# Patient Record
Sex: Female | Born: 1980 | Race: White | Hispanic: No | Marital: Single | Smoking: Never smoker
Health system: Southern US, Community
[De-identification: ages and names within clinical notes are randomized; demographics above are authoritative.]

## PROBLEM LIST (undated history)

## (undated) ENCOUNTER — Emergency Department

## (undated) DIAGNOSIS — M549 Dorsalgia, unspecified: Secondary | ICD-10-CM

## (undated) DIAGNOSIS — N158 Other specified renal tubulo-interstitial diseases: Secondary | ICD-10-CM

## (undated) DIAGNOSIS — Z9889 Other specified postprocedural states: Secondary | ICD-10-CM

## (undated) DIAGNOSIS — G5600 Carpal tunnel syndrome, unspecified upper limb: Secondary | ICD-10-CM

## (undated) DIAGNOSIS — Z950 Presence of cardiac pacemaker: Secondary | ICD-10-CM

## (undated) DIAGNOSIS — G9050 Complex regional pain syndrome I, unspecified: Secondary | ICD-10-CM

## (undated) DIAGNOSIS — G8929 Other chronic pain: Secondary | ICD-10-CM

## (undated) DIAGNOSIS — R112 Nausea with vomiting, unspecified: Secondary | ICD-10-CM

## (undated) DIAGNOSIS — I951 Orthostatic hypotension: Secondary | ICD-10-CM

## (undated) DIAGNOSIS — I495 Sick sinus syndrome: Secondary | ICD-10-CM

## (undated) DIAGNOSIS — G90A Postural orthostatic tachycardia syndrome (POTS): Secondary | ICD-10-CM

## (undated) DIAGNOSIS — R000 Tachycardia, unspecified: Secondary | ICD-10-CM

## (undated) DIAGNOSIS — E612 Magnesium deficiency: Secondary | ICD-10-CM

## (undated) DIAGNOSIS — I498 Other specified cardiac arrhythmias: Secondary | ICD-10-CM

## (undated) DIAGNOSIS — E876 Hypokalemia: Secondary | ICD-10-CM

## (undated) DIAGNOSIS — G809 Cerebral palsy, unspecified: Secondary | ICD-10-CM

## (undated) HISTORY — DX: Tachycardia, unspecified: R00.0

## (undated) HISTORY — DX: Orthostatic hypotension: I95.1

## (undated) HISTORY — DX: Sick sinus syndrome: I49.5

## (undated) HISTORY — DX: Complex regional pain syndrome I, unspecified: G90.50

## (undated) HISTORY — DX: Postural orthostatic tachycardia syndrome (POTS): G90.A

## (undated) HISTORY — PX: PACEMAKER INSERTION: SHX728

## (undated) HISTORY — DX: Other specified cardiac arrhythmias: I49.8

## (undated) HISTORY — PX: INSERT / REPLACE / REMOVE PACEMAKER: SUR710

## (undated) HISTORY — PX: ORTHOPEDIC SURGERY: SHX850

## (undated) HISTORY — DX: Hypokalemia: E87.6

## (undated) HISTORY — PX: PORTACATH PLACEMENT: SHX2246

## (undated) HISTORY — DX: Other specified renal tubulo-interstitial diseases: N15.8

## (undated) HISTORY — DX: Hypomagnesemia: E83.42

## (undated) HISTORY — DX: Cerebral palsy, unspecified: G80.9

---
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
## 2001-01-08 ENCOUNTER — Emergency Department (HOSPITAL_COMMUNITY): Admission: EM | Admit: 2001-01-08 | Discharge: 2001-01-08 | Payer: Self-pay | Admitting: Emergency Medicine

## 2001-02-23 ENCOUNTER — Emergency Department (HOSPITAL_COMMUNITY): Admission: EM | Admit: 2001-02-23 | Discharge: 2001-02-23 | Payer: Self-pay | Admitting: Emergency Medicine

## 2001-04-16 ENCOUNTER — Emergency Department (HOSPITAL_COMMUNITY): Admission: EM | Admit: 2001-04-16 | Discharge: 2001-04-16 | Payer: Self-pay | Admitting: Emergency Medicine

## 2001-04-24 ENCOUNTER — Encounter: Payer: Self-pay | Admitting: Otolaryngology

## 2001-04-24 ENCOUNTER — Encounter: Admission: RE | Admit: 2001-04-24 | Discharge: 2001-04-24 | Payer: Self-pay | Admitting: Otolaryngology

## 2001-05-02 ENCOUNTER — Emergency Department (HOSPITAL_COMMUNITY): Admission: EM | Admit: 2001-05-02 | Discharge: 2001-05-02 | Payer: Self-pay | Admitting: Family Medicine

## 2001-05-25 ENCOUNTER — Emergency Department (HOSPITAL_COMMUNITY): Admission: EM | Admit: 2001-05-25 | Discharge: 2001-05-25 | Payer: Self-pay | Admitting: Emergency Medicine

## 2001-06-11 ENCOUNTER — Emergency Department (HOSPITAL_COMMUNITY): Admission: EM | Admit: 2001-06-11 | Discharge: 2001-06-11 | Payer: Self-pay | Admitting: *Deleted

## 2001-06-11 ENCOUNTER — Encounter: Payer: Self-pay | Admitting: *Deleted

## 2001-06-17 ENCOUNTER — Emergency Department (HOSPITAL_COMMUNITY): Admission: EM | Admit: 2001-06-17 | Discharge: 2001-06-17 | Payer: Self-pay | Admitting: Emergency Medicine

## 2001-06-25 ENCOUNTER — Emergency Department (HOSPITAL_COMMUNITY): Admission: EM | Admit: 2001-06-25 | Discharge: 2001-06-25 | Payer: Self-pay

## 2001-06-28 ENCOUNTER — Emergency Department (HOSPITAL_COMMUNITY): Admission: EM | Admit: 2001-06-28 | Discharge: 2001-06-28 | Payer: Self-pay | Admitting: Emergency Medicine

## 2001-07-07 ENCOUNTER — Emergency Department (HOSPITAL_COMMUNITY): Admission: EM | Admit: 2001-07-07 | Discharge: 2001-07-07 | Payer: Self-pay | Admitting: *Deleted

## 2001-07-13 ENCOUNTER — Emergency Department (HOSPITAL_COMMUNITY): Admission: EM | Admit: 2001-07-13 | Discharge: 2001-07-13 | Payer: Self-pay

## 2001-07-22 ENCOUNTER — Emergency Department (HOSPITAL_COMMUNITY): Admission: EM | Admit: 2001-07-22 | Discharge: 2001-07-22 | Payer: Self-pay | Admitting: Emergency Medicine

## 2001-08-04 ENCOUNTER — Encounter: Payer: Self-pay | Admitting: Emergency Medicine

## 2001-08-04 ENCOUNTER — Emergency Department (HOSPITAL_COMMUNITY): Admission: EM | Admit: 2001-08-04 | Discharge: 2001-08-04 | Payer: Self-pay

## 2001-10-21 ENCOUNTER — Encounter: Payer: Self-pay | Admitting: Emergency Medicine

## 2001-10-21 ENCOUNTER — Emergency Department (HOSPITAL_COMMUNITY): Admission: EM | Admit: 2001-10-21 | Discharge: 2001-10-21 | Payer: Self-pay | Admitting: *Deleted

## 2002-04-04 ENCOUNTER — Emergency Department (HOSPITAL_COMMUNITY): Admission: EM | Admit: 2002-04-04 | Discharge: 2002-04-04 | Payer: Self-pay | Admitting: Emergency Medicine

## 2002-05-07 ENCOUNTER — Emergency Department (HOSPITAL_COMMUNITY): Admission: EM | Admit: 2002-05-07 | Discharge: 2002-05-07 | Payer: Self-pay | Admitting: Emergency Medicine

## 2003-03-23 ENCOUNTER — Emergency Department (HOSPITAL_COMMUNITY): Admission: EM | Admit: 2003-03-23 | Discharge: 2003-03-23 | Payer: Self-pay | Admitting: Emergency Medicine

## 2003-05-11 ENCOUNTER — Emergency Department (HOSPITAL_COMMUNITY): Admission: EM | Admit: 2003-05-11 | Discharge: 2003-05-11 | Payer: Self-pay | Admitting: Emergency Medicine

## 2003-09-22 ENCOUNTER — Emergency Department (HOSPITAL_COMMUNITY): Admission: AD | Admit: 2003-09-22 | Discharge: 2003-09-22 | Payer: Self-pay | Admitting: Family Medicine

## 2004-03-22 ENCOUNTER — Emergency Department (HOSPITAL_COMMUNITY): Admission: AD | Admit: 2004-03-22 | Discharge: 2004-03-22 | Payer: Self-pay | Admitting: Family Medicine

## 2004-07-21 ENCOUNTER — Emergency Department (HOSPITAL_COMMUNITY): Admission: EM | Admit: 2004-07-21 | Discharge: 2004-07-21 | Payer: Self-pay | Admitting: Emergency Medicine

## 2004-08-09 ENCOUNTER — Emergency Department (HOSPITAL_COMMUNITY): Admission: EM | Admit: 2004-08-09 | Discharge: 2004-08-09 | Payer: Self-pay | Admitting: Emergency Medicine

## 2004-11-22 ENCOUNTER — Emergency Department (HOSPITAL_COMMUNITY): Admission: EM | Admit: 2004-11-22 | Discharge: 2004-11-22 | Payer: Self-pay | Admitting: Family Medicine

## 2005-02-01 ENCOUNTER — Emergency Department (HOSPITAL_COMMUNITY): Admission: EM | Admit: 2005-02-01 | Discharge: 2005-02-01 | Payer: Self-pay | Admitting: Family Medicine

## 2005-02-15 ENCOUNTER — Encounter: Admission: RE | Admit: 2005-02-15 | Discharge: 2005-02-25 | Payer: Self-pay | Admitting: Family Medicine

## 2005-02-28 ENCOUNTER — Encounter: Admission: RE | Admit: 2005-02-28 | Discharge: 2005-02-28 | Payer: Self-pay | Admitting: Family Medicine

## 2005-03-21 ENCOUNTER — Encounter: Admission: RE | Admit: 2005-03-21 | Discharge: 2005-03-21 | Payer: Self-pay | Admitting: Orthopaedic Surgery

## 2005-03-31 ENCOUNTER — Encounter: Admission: RE | Admit: 2005-03-31 | Discharge: 2005-03-31 | Payer: Self-pay | Admitting: Orthopaedic Surgery

## 2005-04-06 ENCOUNTER — Emergency Department (HOSPITAL_COMMUNITY): Admission: EM | Admit: 2005-04-06 | Discharge: 2005-04-06 | Payer: Self-pay | Admitting: Family Medicine

## 2005-04-23 ENCOUNTER — Emergency Department (HOSPITAL_COMMUNITY): Admission: EM | Admit: 2005-04-23 | Discharge: 2005-04-23 | Payer: Self-pay | Admitting: Emergency Medicine

## 2005-04-27 ENCOUNTER — Emergency Department (HOSPITAL_COMMUNITY): Admission: EM | Admit: 2005-04-27 | Discharge: 2005-04-27 | Payer: Self-pay | Admitting: Emergency Medicine

## 2005-07-03 ENCOUNTER — Emergency Department (HOSPITAL_COMMUNITY): Admission: EM | Admit: 2005-07-03 | Discharge: 2005-07-03 | Payer: Self-pay | Admitting: Emergency Medicine

## 2005-07-20 ENCOUNTER — Encounter
Admission: RE | Admit: 2005-07-20 | Discharge: 2005-10-18 | Payer: Self-pay | Admitting: Physical Medicine and Rehabilitation

## 2005-07-24 ENCOUNTER — Emergency Department (HOSPITAL_COMMUNITY): Admission: EM | Admit: 2005-07-24 | Discharge: 2005-07-24 | Payer: Self-pay | Admitting: Family Medicine

## 2005-07-26 ENCOUNTER — Emergency Department (HOSPITAL_COMMUNITY): Admission: EM | Admit: 2005-07-26 | Discharge: 2005-07-26 | Payer: Self-pay | Admitting: Emergency Medicine

## 2005-08-02 ENCOUNTER — Emergency Department (HOSPITAL_COMMUNITY): Admission: EM | Admit: 2005-08-02 | Discharge: 2005-08-02 | Payer: Self-pay | Admitting: Emergency Medicine

## 2005-08-04 ENCOUNTER — Emergency Department (HOSPITAL_COMMUNITY): Admission: EM | Admit: 2005-08-04 | Discharge: 2005-08-04 | Payer: Self-pay | Admitting: Emergency Medicine

## 2005-08-10 ENCOUNTER — Encounter
Admission: RE | Admit: 2005-08-10 | Discharge: 2005-08-10 | Payer: Self-pay | Admitting: Physical Medicine and Rehabilitation

## 2005-11-07 ENCOUNTER — Emergency Department (HOSPITAL_COMMUNITY): Admission: EM | Admit: 2005-11-07 | Discharge: 2005-11-07 | Payer: Self-pay | Admitting: Family Medicine

## 2005-12-12 ENCOUNTER — Emergency Department (HOSPITAL_COMMUNITY): Admission: EM | Admit: 2005-12-12 | Discharge: 2005-12-12 | Payer: Self-pay | Admitting: Family Medicine

## 2006-01-07 ENCOUNTER — Emergency Department (HOSPITAL_COMMUNITY): Admission: EM | Admit: 2006-01-07 | Discharge: 2006-01-07 | Payer: Self-pay | Admitting: Family Medicine

## 2006-01-30 ENCOUNTER — Emergency Department (HOSPITAL_COMMUNITY): Admission: EM | Admit: 2006-01-30 | Discharge: 2006-01-30 | Payer: Self-pay | Admitting: Family Medicine

## 2006-02-20 ENCOUNTER — Emergency Department (HOSPITAL_COMMUNITY): Admission: EM | Admit: 2006-02-20 | Discharge: 2006-02-20 | Payer: Self-pay | Admitting: Family Medicine

## 2006-03-03 ENCOUNTER — Emergency Department (HOSPITAL_COMMUNITY): Admission: EM | Admit: 2006-03-03 | Discharge: 2006-03-04 | Payer: Self-pay | Admitting: Emergency Medicine

## 2006-03-04 ENCOUNTER — Emergency Department (HOSPITAL_COMMUNITY): Admission: EM | Admit: 2006-03-04 | Discharge: 2006-03-04 | Payer: Self-pay | Admitting: Family Medicine

## 2006-03-07 ENCOUNTER — Encounter: Payer: Self-pay | Admitting: Cardiology

## 2006-03-07 ENCOUNTER — Ambulatory Visit: Payer: Self-pay

## 2006-03-08 ENCOUNTER — Ambulatory Visit: Payer: Self-pay | Admitting: Cardiology

## 2006-03-30 ENCOUNTER — Emergency Department (HOSPITAL_COMMUNITY): Admission: EM | Admit: 2006-03-30 | Discharge: 2006-03-30 | Payer: Self-pay | Admitting: Family Medicine

## 2006-04-15 ENCOUNTER — Emergency Department (HOSPITAL_COMMUNITY): Admission: EM | Admit: 2006-04-15 | Discharge: 2006-04-15 | Payer: Self-pay | Admitting: Family Medicine

## 2006-04-29 ENCOUNTER — Emergency Department (HOSPITAL_COMMUNITY): Admission: EM | Admit: 2006-04-29 | Discharge: 2006-04-29 | Payer: Self-pay | Admitting: Family Medicine

## 2006-05-23 ENCOUNTER — Emergency Department (HOSPITAL_COMMUNITY): Admission: EM | Admit: 2006-05-23 | Discharge: 2006-05-23 | Payer: Self-pay | Admitting: Family Medicine

## 2006-06-11 ENCOUNTER — Emergency Department (HOSPITAL_COMMUNITY): Admission: EM | Admit: 2006-06-11 | Discharge: 2006-06-11 | Payer: Self-pay | Admitting: Family Medicine

## 2006-06-11 ENCOUNTER — Ambulatory Visit: Payer: Self-pay | Admitting: Cardiology

## 2006-06-13 ENCOUNTER — Ambulatory Visit: Payer: Self-pay | Admitting: Cardiovascular Disease

## 2006-06-13 ENCOUNTER — Ambulatory Visit: Payer: Self-pay | Admitting: Cardiology

## 2006-06-13 ENCOUNTER — Inpatient Hospital Stay (HOSPITAL_COMMUNITY): Admission: EM | Admit: 2006-06-13 | Discharge: 2006-06-15 | Payer: Self-pay | Admitting: Emergency Medicine

## 2006-06-19 ENCOUNTER — Inpatient Hospital Stay (HOSPITAL_COMMUNITY): Admission: EM | Admit: 2006-06-19 | Discharge: 2006-06-23 | Payer: Self-pay | Admitting: *Deleted

## 2006-06-19 ENCOUNTER — Ambulatory Visit: Payer: Self-pay | Admitting: Cardiology

## 2006-06-22 ENCOUNTER — Ambulatory Visit: Payer: Self-pay | Admitting: Physical Medicine & Rehabilitation

## 2006-06-26 ENCOUNTER — Emergency Department (HOSPITAL_COMMUNITY): Admission: EM | Admit: 2006-06-26 | Discharge: 2006-06-26 | Payer: Self-pay | Admitting: Family Medicine

## 2006-07-06 ENCOUNTER — Ambulatory Visit: Payer: Self-pay | Admitting: Cardiology

## 2006-07-10 ENCOUNTER — Ambulatory Visit: Payer: Self-pay | Admitting: *Deleted

## 2006-07-10 ENCOUNTER — Inpatient Hospital Stay (HOSPITAL_COMMUNITY): Admission: EM | Admit: 2006-07-10 | Discharge: 2006-07-12 | Payer: Self-pay | Admitting: Emergency Medicine

## 2006-07-13 ENCOUNTER — Ambulatory Visit: Payer: Self-pay | Admitting: Internal Medicine

## 2006-07-17 ENCOUNTER — Emergency Department (HOSPITAL_COMMUNITY): Admission: EM | Admit: 2006-07-17 | Discharge: 2006-07-17 | Payer: Self-pay | Admitting: *Deleted

## 2006-07-28 ENCOUNTER — Ambulatory Visit: Payer: Self-pay | Admitting: Internal Medicine

## 2006-08-06 ENCOUNTER — Emergency Department (HOSPITAL_COMMUNITY): Admission: EM | Admit: 2006-08-06 | Discharge: 2006-08-06 | Payer: Self-pay | Admitting: Emergency Medicine

## 2006-08-06 ENCOUNTER — Ambulatory Visit: Payer: Self-pay | Admitting: Cardiology

## 2006-08-12 ENCOUNTER — Ambulatory Visit: Payer: Self-pay | Admitting: Internal Medicine

## 2006-08-14 ENCOUNTER — Emergency Department (HOSPITAL_COMMUNITY): Admission: EM | Admit: 2006-08-14 | Discharge: 2006-08-14 | Payer: Self-pay | Admitting: Emergency Medicine

## 2006-08-17 ENCOUNTER — Emergency Department (HOSPITAL_COMMUNITY): Admission: EM | Admit: 2006-08-17 | Discharge: 2006-08-17 | Payer: Self-pay | Admitting: Family Medicine

## 2006-08-22 ENCOUNTER — Ambulatory Visit: Payer: Self-pay | Admitting: Internal Medicine

## 2006-08-22 ENCOUNTER — Inpatient Hospital Stay (HOSPITAL_COMMUNITY): Admission: AD | Admit: 2006-08-22 | Discharge: 2006-08-24 | Payer: Self-pay | Admitting: Internal Medicine

## 2006-08-28 ENCOUNTER — Emergency Department (HOSPITAL_COMMUNITY): Admission: EM | Admit: 2006-08-28 | Discharge: 2006-08-28 | Payer: Self-pay | Admitting: Emergency Medicine

## 2006-09-01 ENCOUNTER — Ambulatory Visit: Payer: Self-pay | Admitting: Internal Medicine

## 2006-09-04 ENCOUNTER — Emergency Department (HOSPITAL_COMMUNITY): Admission: EM | Admit: 2006-09-04 | Discharge: 2006-09-04 | Payer: Self-pay | Admitting: *Deleted

## 2006-09-05 ENCOUNTER — Emergency Department (HOSPITAL_COMMUNITY): Admission: EM | Admit: 2006-09-05 | Discharge: 2006-09-05 | Payer: Self-pay | Admitting: Emergency Medicine

## 2006-09-08 ENCOUNTER — Ambulatory Visit: Payer: Self-pay | Admitting: Cardiology

## 2006-09-08 ENCOUNTER — Emergency Department (HOSPITAL_COMMUNITY): Admission: EM | Admit: 2006-09-08 | Discharge: 2006-09-08 | Payer: Self-pay | Admitting: Family Medicine

## 2006-09-08 ENCOUNTER — Inpatient Hospital Stay (HOSPITAL_COMMUNITY): Admission: EM | Admit: 2006-09-08 | Discharge: 2006-09-09 | Payer: Self-pay | Admitting: Emergency Medicine

## 2006-09-09 ENCOUNTER — Emergency Department (HOSPITAL_COMMUNITY): Admission: EM | Admit: 2006-09-09 | Discharge: 2006-09-10 | Payer: Self-pay | Admitting: *Deleted

## 2006-09-10 ENCOUNTER — Inpatient Hospital Stay (HOSPITAL_COMMUNITY): Admission: EM | Admit: 2006-09-10 | Discharge: 2006-09-19 | Payer: Self-pay | Admitting: Emergency Medicine

## 2006-09-10 ENCOUNTER — Ambulatory Visit: Payer: Self-pay | Admitting: Internal Medicine

## 2006-09-20 ENCOUNTER — Emergency Department (HOSPITAL_COMMUNITY): Admission: EM | Admit: 2006-09-20 | Discharge: 2006-09-20 | Payer: Self-pay | Admitting: Emergency Medicine

## 2006-09-23 ENCOUNTER — Emergency Department (HOSPITAL_COMMUNITY): Admission: EM | Admit: 2006-09-23 | Discharge: 2006-09-23 | Payer: Self-pay | Admitting: Emergency Medicine

## 2006-09-23 ENCOUNTER — Emergency Department (HOSPITAL_COMMUNITY): Admission: EM | Admit: 2006-09-23 | Discharge: 2006-09-23 | Payer: Self-pay | Admitting: Family Medicine

## 2006-09-25 ENCOUNTER — Ambulatory Visit: Payer: Self-pay | Admitting: Cardiology

## 2006-09-25 ENCOUNTER — Inpatient Hospital Stay (HOSPITAL_COMMUNITY): Admission: EM | Admit: 2006-09-25 | Discharge: 2006-09-27 | Payer: Self-pay | Admitting: Emergency Medicine

## 2006-09-27 ENCOUNTER — Ambulatory Visit: Payer: Self-pay | Admitting: Cardiology

## 2006-09-27 ENCOUNTER — Inpatient Hospital Stay (HOSPITAL_COMMUNITY): Admission: EM | Admit: 2006-09-27 | Discharge: 2006-09-29 | Payer: Self-pay | Admitting: Emergency Medicine

## 2006-09-28 ENCOUNTER — Encounter: Payer: Self-pay | Admitting: Cardiology

## 2006-10-21 ENCOUNTER — Ambulatory Visit: Payer: Self-pay | Admitting: Internal Medicine

## 2006-11-13 ENCOUNTER — Emergency Department (HOSPITAL_COMMUNITY): Admission: EM | Admit: 2006-11-13 | Discharge: 2006-11-13 | Payer: Self-pay | Admitting: Emergency Medicine

## 2006-12-04 ENCOUNTER — Emergency Department (HOSPITAL_COMMUNITY): Admission: EM | Admit: 2006-12-04 | Discharge: 2006-12-04 | Payer: Self-pay | Admitting: Emergency Medicine

## 2006-12-11 ENCOUNTER — Emergency Department (HOSPITAL_COMMUNITY): Admission: EM | Admit: 2006-12-11 | Discharge: 2006-12-11 | Payer: Self-pay | Admitting: Emergency Medicine

## 2006-12-13 DIAGNOSIS — I495 Sick sinus syndrome: Secondary | ICD-10-CM

## 2006-12-13 HISTORY — DX: Sick sinus syndrome: I49.5

## 2007-01-18 ENCOUNTER — Emergency Department (HOSPITAL_COMMUNITY): Admission: EM | Admit: 2007-01-18 | Discharge: 2007-01-18 | Payer: Self-pay | Admitting: Emergency Medicine

## 2007-01-29 ENCOUNTER — Emergency Department (HOSPITAL_COMMUNITY): Admission: EM | Admit: 2007-01-29 | Discharge: 2007-01-29 | Payer: Self-pay | Admitting: Emergency Medicine

## 2007-02-18 ENCOUNTER — Emergency Department (HOSPITAL_COMMUNITY): Admission: EM | Admit: 2007-02-18 | Discharge: 2007-02-18 | Payer: Self-pay | Admitting: Emergency Medicine

## 2007-03-01 ENCOUNTER — Emergency Department (HOSPITAL_COMMUNITY): Admission: EM | Admit: 2007-03-01 | Discharge: 2007-03-01 | Payer: Self-pay | Admitting: Emergency Medicine

## 2007-03-11 ENCOUNTER — Emergency Department (HOSPITAL_COMMUNITY): Admission: EM | Admit: 2007-03-11 | Discharge: 2007-03-11 | Payer: Self-pay | Admitting: Emergency Medicine

## 2007-03-26 ENCOUNTER — Emergency Department (HOSPITAL_COMMUNITY): Admission: EM | Admit: 2007-03-26 | Discharge: 2007-03-26 | Payer: Self-pay | Admitting: Emergency Medicine

## 2007-04-05 ENCOUNTER — Emergency Department (HOSPITAL_COMMUNITY): Admission: EM | Admit: 2007-04-05 | Discharge: 2007-04-05 | Payer: Self-pay | Admitting: Emergency Medicine

## 2007-04-07 ENCOUNTER — Emergency Department (HOSPITAL_COMMUNITY): Admission: EM | Admit: 2007-04-07 | Discharge: 2007-04-08 | Payer: Self-pay | Admitting: Emergency Medicine

## 2007-04-26 ENCOUNTER — Inpatient Hospital Stay (HOSPITAL_COMMUNITY): Admission: EM | Admit: 2007-04-26 | Discharge: 2007-04-26 | Payer: Self-pay | Admitting: Emergency Medicine

## 2007-04-26 ENCOUNTER — Ambulatory Visit: Payer: Self-pay | Admitting: Cardiology

## 2007-05-01 ENCOUNTER — Emergency Department (HOSPITAL_COMMUNITY): Admission: EM | Admit: 2007-05-01 | Discharge: 2007-05-01 | Payer: Self-pay | Admitting: Emergency Medicine

## 2007-05-14 ENCOUNTER — Emergency Department (HOSPITAL_COMMUNITY): Admission: EM | Admit: 2007-05-14 | Discharge: 2007-05-14 | Payer: Self-pay | Admitting: Emergency Medicine

## 2007-05-25 ENCOUNTER — Emergency Department (HOSPITAL_COMMUNITY): Admission: EM | Admit: 2007-05-25 | Discharge: 2007-05-25 | Payer: Self-pay | Admitting: Emergency Medicine

## 2007-08-19 ENCOUNTER — Emergency Department (HOSPITAL_COMMUNITY): Admission: EM | Admit: 2007-08-19 | Discharge: 2007-08-19 | Payer: Self-pay | Admitting: Emergency Medicine

## 2007-09-09 ENCOUNTER — Emergency Department (HOSPITAL_COMMUNITY): Admission: EM | Admit: 2007-09-09 | Discharge: 2007-09-09 | Payer: Self-pay | Admitting: Emergency Medicine

## 2007-09-20 ENCOUNTER — Emergency Department (HOSPITAL_COMMUNITY): Admission: EM | Admit: 2007-09-20 | Discharge: 2007-09-20 | Payer: Self-pay | Admitting: Emergency Medicine

## 2007-11-27 ENCOUNTER — Emergency Department (HOSPITAL_COMMUNITY): Admission: EM | Admit: 2007-11-27 | Discharge: 2007-11-27 | Payer: Self-pay | Admitting: Emergency Medicine

## 2007-12-31 ENCOUNTER — Emergency Department (HOSPITAL_COMMUNITY): Admission: EM | Admit: 2007-12-31 | Discharge: 2007-12-31 | Payer: Self-pay | Admitting: Emergency Medicine

## 2008-06-08 ENCOUNTER — Emergency Department (HOSPITAL_COMMUNITY): Admission: EM | Admit: 2008-06-08 | Discharge: 2008-06-08 | Payer: Self-pay | Admitting: Emergency Medicine

## 2008-08-10 ENCOUNTER — Emergency Department (HOSPITAL_COMMUNITY): Admission: EM | Admit: 2008-08-10 | Discharge: 2008-08-10 | Payer: Self-pay | Admitting: Emergency Medicine

## 2008-12-06 ENCOUNTER — Emergency Department (HOSPITAL_COMMUNITY): Admission: EM | Admit: 2008-12-06 | Discharge: 2008-12-06 | Payer: Self-pay | Admitting: Emergency Medicine

## 2008-12-22 ENCOUNTER — Emergency Department (HOSPITAL_COMMUNITY): Admission: EM | Admit: 2008-12-22 | Discharge: 2008-12-23 | Payer: Self-pay | Admitting: Emergency Medicine

## 2009-01-15 ENCOUNTER — Emergency Department (HOSPITAL_COMMUNITY): Admission: EM | Admit: 2009-01-15 | Discharge: 2009-01-16 | Payer: Self-pay | Admitting: Emergency Medicine

## 2009-01-24 ENCOUNTER — Ambulatory Visit (HOSPITAL_COMMUNITY): Admission: RE | Admit: 2009-01-24 | Discharge: 2009-01-24 | Payer: Self-pay | Admitting: Family Medicine

## 2009-02-01 ENCOUNTER — Emergency Department (HOSPITAL_COMMUNITY): Admission: EM | Admit: 2009-02-01 | Discharge: 2009-02-01 | Payer: Self-pay | Admitting: Emergency Medicine

## 2009-02-20 ENCOUNTER — Encounter: Admission: RE | Admit: 2009-02-20 | Discharge: 2009-04-01 | Payer: Self-pay | Admitting: Physician Assistant

## 2009-03-15 ENCOUNTER — Emergency Department (HOSPITAL_COMMUNITY): Admission: EM | Admit: 2009-03-15 | Discharge: 2009-03-15 | Payer: Self-pay | Admitting: Emergency Medicine

## 2009-05-31 ENCOUNTER — Emergency Department (HOSPITAL_COMMUNITY): Admission: EM | Admit: 2009-05-31 | Discharge: 2009-05-31 | Payer: Self-pay | Admitting: Family Medicine

## 2009-06-08 ENCOUNTER — Emergency Department (HOSPITAL_COMMUNITY): Admission: EM | Admit: 2009-06-08 | Discharge: 2009-06-08 | Payer: Self-pay | Admitting: Emergency Medicine

## 2009-12-22 ENCOUNTER — Ambulatory Visit: Payer: Self-pay | Admitting: Internal Medicine

## 2009-12-22 ENCOUNTER — Emergency Department (HOSPITAL_COMMUNITY): Admission: EM | Admit: 2009-12-22 | Discharge: 2009-12-23 | Payer: Self-pay | Admitting: Emergency Medicine

## 2010-03-18 ENCOUNTER — Emergency Department (HOSPITAL_COMMUNITY): Admission: EM | Admit: 2010-03-18 | Discharge: 2010-03-18 | Payer: Self-pay | Admitting: Emergency Medicine

## 2010-05-26 ENCOUNTER — Emergency Department (HOSPITAL_COMMUNITY): Admission: EM | Admit: 2010-05-26 | Discharge: 2010-05-26 | Payer: Self-pay | Admitting: Emergency Medicine

## 2010-07-10 ENCOUNTER — Emergency Department (HOSPITAL_COMMUNITY): Admission: EM | Admit: 2010-07-10 | Discharge: 2010-07-10 | Payer: Self-pay | Admitting: Emergency Medicine

## 2010-07-10 ENCOUNTER — Emergency Department (HOSPITAL_COMMUNITY): Admission: EM | Admit: 2010-07-10 | Discharge: 2010-07-11 | Payer: Self-pay | Admitting: Emergency Medicine

## 2010-10-14 ENCOUNTER — Emergency Department (HOSPITAL_COMMUNITY): Admission: EM | Admit: 2010-10-14 | Discharge: 2010-10-14 | Payer: Self-pay | Admitting: Emergency Medicine

## 2010-10-27 ENCOUNTER — Encounter: Payer: Self-pay | Admitting: Orthopedic Surgery

## 2010-11-03 ENCOUNTER — Emergency Department: Payer: Self-pay | Admitting: Emergency Medicine

## 2010-11-12 ENCOUNTER — Encounter: Payer: Self-pay | Admitting: Orthopedic Surgery

## 2010-11-28 ENCOUNTER — Observation Stay: Payer: Self-pay | Admitting: Internal Medicine

## 2010-12-13 ENCOUNTER — Encounter: Payer: Self-pay | Admitting: Orthopedic Surgery

## 2010-12-25 ENCOUNTER — Emergency Department: Payer: Self-pay | Admitting: Emergency Medicine

## 2011-01-01 ENCOUNTER — Emergency Department (HOSPITAL_COMMUNITY)
Admission: EM | Admit: 2011-01-01 | Discharge: 2011-01-01 | Payer: Self-pay | Source: Home / Self Care | Admitting: Emergency Medicine

## 2011-01-03 ENCOUNTER — Encounter: Payer: Self-pay | Admitting: Orthopaedic Surgery

## 2011-01-03 ENCOUNTER — Encounter: Payer: Self-pay | Admitting: Cardiology

## 2011-01-20 ENCOUNTER — Other Ambulatory Visit: Payer: Self-pay | Admitting: Family Medicine

## 2011-01-20 DIAGNOSIS — R609 Edema, unspecified: Secondary | ICD-10-CM

## 2011-01-21 ENCOUNTER — Other Ambulatory Visit: Payer: Self-pay

## 2011-01-21 ENCOUNTER — Ambulatory Visit
Admission: RE | Admit: 2011-01-21 | Discharge: 2011-01-21 | Disposition: A | Discharge status: Home / Self Care | Payer: Medicaid Other | Source: Ambulatory Visit | Attending: Family Medicine | Admitting: Family Medicine

## 2011-01-21 DIAGNOSIS — R609 Edema, unspecified: Secondary | ICD-10-CM

## 2011-01-25 ENCOUNTER — Other Ambulatory Visit: Payer: Self-pay

## 2011-02-20 ENCOUNTER — Emergency Department: Payer: Self-pay | Admitting: Unknown Physician Specialty

## 2011-02-27 LAB — CBC
MCV: 89 fL (ref 78.0–100.0)
Platelets: 148 10*3/uL — ABNORMAL LOW (ref 150–400)
RBC: 4.24 MIL/uL (ref 3.87–5.11)
WBC: 11.1 10*3/uL — ABNORMAL HIGH (ref 4.0–10.5)

## 2011-02-27 LAB — DIFFERENTIAL
Eosinophils Relative: 0 % (ref 0–5)
Lymphocytes Relative: 16 % (ref 12–46)
Lymphs Abs: 1.8 10*3/uL (ref 0.7–4.0)
Neutro Abs: 8.5 10*3/uL — ABNORMAL HIGH (ref 1.7–7.7)

## 2011-02-27 LAB — POCT CARDIAC MARKERS: Myoglobin, poc: 58 ng/mL (ref 12–200)

## 2011-02-27 LAB — D-DIMER, QUANTITATIVE: D-Dimer, Quant: 0.93 ug/mL-FEU — ABNORMAL HIGH (ref 0.00–0.48)

## 2011-02-27 LAB — POCT I-STAT, CHEM 8
BUN: 14 mg/dL (ref 6–23)
Chloride: 103 mEq/L (ref 96–112)
HCT: 41 % (ref 36.0–46.0)
Sodium: 140 mEq/L (ref 135–145)
TCO2: 26 mmol/L (ref 0–100)

## 2011-02-28 LAB — BASIC METABOLIC PANEL
CO2: 29 mEq/L (ref 19–32)
Chloride: 101 mEq/L (ref 96–112)
Creatinine, Ser: 0.84 mg/dL (ref 0.4–1.2)
GFR calc Af Amer: >60 mL/min (ref >60)
Potassium: 3.3 mEq/L — ABNORMAL LOW (ref 3.5–5.1)
Sodium: 140 mEq/L (ref 135–145)

## 2011-02-28 LAB — CBC
HCT: 36.9 % (ref 36.0–46.0)
Hemoglobin: 13 g/dL (ref 12.0–15.0)
MCHC: 35.3 g/dL (ref 30.0–36.0)
MCV: 88.8 fL (ref 78.0–100.0)
RBC: 4.15 MIL/uL (ref 3.87–5.11)
WBC: 10.1 10*3/uL (ref 4.0–10.5)

## 2011-02-28 LAB — DIFFERENTIAL
Basophils Relative: 0 % (ref 0–1)
Eosinophils Absolute: 0 10*3/uL (ref 0.0–0.7)
Lymphs Abs: 2.5 10*3/uL (ref 0.7–4.0)
Monocytes Absolute: 0.7 10*3/uL (ref 0.1–1.0)
Monocytes Relative: 7 % (ref 3–12)

## 2011-03-01 LAB — BASIC METABOLIC PANEL
BUN: 12 mg/dL (ref 6–23)
CO2: 26 mEq/L (ref 19–32)
Calcium: 9.3 mg/dL (ref 8.4–10.5)
GFR calc non Af Amer: >60 mL/min (ref >60)
Glucose, Bld: 94 mg/dL (ref 70–99)

## 2011-03-01 LAB — HEPATIC FUNCTION PANEL
ALT: 20 U/L (ref 0–35)
AST: 21 U/L (ref 0–37)
Bilirubin, Direct: <0.1 mg/dL (ref 0.0–0.3)
Total Bilirubin: 0.4 mg/dL (ref 0.3–1.2)

## 2011-03-03 LAB — POCT I-STAT, CHEM 8
Creatinine, Ser: 0.7 mg/dL (ref 0.4–1.2)
Hemoglobin: 12.2 g/dL (ref 12.0–15.0)
Potassium: 3.5 mEq/L (ref 3.5–5.1)
Sodium: 142 mEq/L (ref 135–145)
TCO2: 27 mmol/L (ref 0–100)

## 2011-03-03 LAB — POCT CARDIAC MARKERS
CKMB, poc: <1 ng/mL — ABNORMAL LOW (ref 1.0–8.0)
Myoglobin, poc: 30 ng/mL (ref 12–200)

## 2011-03-03 LAB — CBC
MCHC: 34.3 g/dL (ref 30.0–36.0)
MCV: 89.9 fL (ref 78.0–100.0)
Platelets: 177 10*3/uL (ref 150–400)
RBC: 4.16 MIL/uL (ref 3.87–5.11)
WBC: 13.9 10*3/uL — ABNORMAL HIGH (ref 4.0–10.5)

## 2011-03-08 ENCOUNTER — Emergency Department (HOSPITAL_COMMUNITY)
Admission: EM | Admit: 2011-03-08 | Discharge: 2011-03-08 | Disposition: A | Discharge status: Home / Self Care | Payer: Medicaid Other | Attending: Emergency Medicine | Admitting: Emergency Medicine

## 2011-03-08 DIAGNOSIS — R300 Dysuria: Secondary | ICD-10-CM | POA: Insufficient documentation

## 2011-03-08 DIAGNOSIS — G809 Cerebral palsy, unspecified: Secondary | ICD-10-CM | POA: Insufficient documentation

## 2011-03-08 DIAGNOSIS — N390 Urinary tract infection, site not specified: Secondary | ICD-10-CM | POA: Insufficient documentation

## 2011-03-08 DIAGNOSIS — R109 Unspecified abdominal pain: Secondary | ICD-10-CM | POA: Insufficient documentation

## 2011-03-08 DIAGNOSIS — G610 Guillain-Barre syndrome: Secondary | ICD-10-CM | POA: Insufficient documentation

## 2011-03-08 LAB — URINALYSIS, ROUTINE W REFLEX MICROSCOPIC
Glucose, UA: NEGATIVE mg/dL
Ketones, ur: NEGATIVE mg/dL
Protein, ur: NEGATIVE mg/dL
Urobilinogen, UA: 0.2 mg/dL (ref 0.0–1.0)

## 2011-03-08 LAB — CBC
MCH: 28.7 pg (ref 26.0–34.0)
MCHC: 33.7 g/dL (ref 30.0–36.0)
Platelets: 160 10*3/uL (ref 150–400)
RDW: 13.5 % (ref 11.5–15.5)

## 2011-03-08 LAB — BASIC METABOLIC PANEL
BUN: 11 mg/dL (ref 6–23)
Calcium: 9.2 mg/dL (ref 8.4–10.5)
Chloride: 101 mEq/L (ref 96–112)
Creatinine, Ser: 0.71 mg/dL (ref 0.4–1.2)
GFR calc Af Amer: >60 mL/min (ref >60)

## 2011-03-08 LAB — DIFFERENTIAL
Basophils Absolute: 0 10*3/uL (ref 0.0–0.1)
Basophils Relative: 0 % (ref 0–1)
Eosinophils Absolute: 0 10*3/uL (ref 0.0–0.7)
Eosinophils Relative: 0 % (ref 0–5)
Monocytes Absolute: 0.6 10*3/uL (ref 0.1–1.0)
Monocytes Relative: 6 % (ref 3–12)
Neutro Abs: 7.4 10*3/uL (ref 1.7–7.7)

## 2011-03-08 LAB — URINE MICROSCOPIC-ADD ON

## 2011-03-08 LAB — POCT PREGNANCY, URINE: Preg Test, Ur: NEGATIVE

## 2011-03-09 ENCOUNTER — Ambulatory Visit: Payer: Medicaid Other | Attending: Physical Medicine and Rehabilitation | Admitting: Physical Therapy

## 2011-03-09 DIAGNOSIS — IMO0001 Reserved for inherently not codable concepts without codable children: Secondary | ICD-10-CM | POA: Insufficient documentation

## 2011-03-09 DIAGNOSIS — R262 Difficulty in walking, not elsewhere classified: Secondary | ICD-10-CM | POA: Insufficient documentation

## 2011-03-09 DIAGNOSIS — R293 Abnormal posture: Secondary | ICD-10-CM | POA: Insufficient documentation

## 2011-03-10 LAB — URINE CULTURE

## 2011-03-23 ENCOUNTER — Inpatient Hospital Stay: Payer: Self-pay | Admitting: Specialist

## 2011-03-28 ENCOUNTER — Emergency Department (HOSPITAL_COMMUNITY)
Admission: EM | Admit: 2011-03-28 | Discharge: 2011-03-28 | Disposition: A | Discharge status: Home / Self Care | Payer: Medicaid Other | Attending: Emergency Medicine | Admitting: Emergency Medicine

## 2011-03-28 DIAGNOSIS — R5383 Other fatigue: Secondary | ICD-10-CM | POA: Insufficient documentation

## 2011-03-28 DIAGNOSIS — R5381 Other malaise: Secondary | ICD-10-CM | POA: Insufficient documentation

## 2011-03-28 DIAGNOSIS — R002 Palpitations: Secondary | ICD-10-CM | POA: Insufficient documentation

## 2011-03-28 DIAGNOSIS — Z950 Presence of cardiac pacemaker: Secondary | ICD-10-CM | POA: Insufficient documentation

## 2011-03-28 DIAGNOSIS — R42 Dizziness and giddiness: Secondary | ICD-10-CM | POA: Insufficient documentation

## 2011-03-28 DIAGNOSIS — Z79899 Other long term (current) drug therapy: Secondary | ICD-10-CM | POA: Insufficient documentation

## 2011-03-28 DIAGNOSIS — F411 Generalized anxiety disorder: Secondary | ICD-10-CM | POA: Insufficient documentation

## 2011-03-28 DIAGNOSIS — G610 Guillain-Barre syndrome: Secondary | ICD-10-CM | POA: Insufficient documentation

## 2011-03-28 DIAGNOSIS — G809 Cerebral palsy, unspecified: Secondary | ICD-10-CM | POA: Insufficient documentation

## 2011-03-28 LAB — CBC
HCT: 36 % (ref 36.0–46.0)
Hemoglobin: 12.5 g/dL (ref 12.0–15.0)
MCH: 28.9 pg (ref 26.0–34.0)
MCHC: 34.7 g/dL (ref 30.0–36.0)

## 2011-03-28 LAB — DIFFERENTIAL
Lymphocytes Relative: 24 % (ref 12–46)
Monocytes Absolute: 0.6 10*3/uL (ref 0.1–1.0)
Monocytes Relative: 6 % (ref 3–12)
Neutro Abs: 7.2 10*3/uL (ref 1.7–7.7)

## 2011-03-28 LAB — URINALYSIS, ROUTINE W REFLEX MICROSCOPIC
Bilirubin Urine: NEGATIVE
Glucose, UA: NEGATIVE mg/dL
Ketones, ur: 15 mg/dL — AB
Protein, ur: NEGATIVE mg/dL

## 2011-03-28 LAB — COMPREHENSIVE METABOLIC PANEL
ALT: 33 U/L (ref 0–35)
CO2: 27 mEq/L (ref 19–32)
Calcium: 9.9 mg/dL (ref 8.4–10.5)
Creatinine, Ser: 0.74 mg/dL (ref 0.4–1.2)
GFR calc non Af Amer: >60 mL/min (ref >60)
Glucose, Bld: 98 mg/dL (ref 70–99)
Sodium: 137 mEq/L (ref 135–145)

## 2011-03-28 LAB — LIPASE, BLOOD: Lipase: 23 U/L (ref 11–59)

## 2011-03-29 LAB — DIFFERENTIAL
Basophils Absolute: 0 10*3/uL (ref 0.0–0.1)
Lymphocytes Relative: 16 % (ref 12–46)
Lymphs Abs: 1.9 10*3/uL (ref 0.7–4.0)
Neutrophils Relative %: 80 % — ABNORMAL HIGH (ref 43–77)

## 2011-03-29 LAB — URINALYSIS, ROUTINE W REFLEX MICROSCOPIC
Glucose, UA: NEGATIVE mg/dL
Hgb urine dipstick: NEGATIVE
Ketones, ur: 40 mg/dL — AB
Nitrite: NEGATIVE
pH: 6 (ref 5.0–8.0)

## 2011-03-29 LAB — COMPREHENSIVE METABOLIC PANEL
BUN: 12 mg/dL (ref 6–23)
CO2: 26 mEq/L (ref 19–32)
Calcium: 9.4 mg/dL (ref 8.4–10.5)
Chloride: 102 mEq/L (ref 96–112)
Creatinine, Ser: 0.81 mg/dL (ref 0.4–1.2)
GFR calc non Af Amer: >60 mL/min (ref >60)
Glucose, Bld: 100 mg/dL — ABNORMAL HIGH (ref 70–99)
Total Bilirubin: 1.1 mg/dL (ref 0.3–1.2)

## 2011-03-29 LAB — LIPASE, BLOOD: Lipase: 19 U/L (ref 11–59)

## 2011-03-29 LAB — CBC
HCT: 43.8 % (ref 36.0–46.0)
Hemoglobin: 14.7 g/dL (ref 12.0–15.0)
MCHC: 33.6 g/dL (ref 30.0–36.0)
MCV: 90.6 fL (ref 78.0–100.0)
RBC: 4.84 MIL/uL (ref 3.87–5.11)

## 2011-03-30 LAB — BASIC METABOLIC PANEL
BUN: 17 mg/dL (ref 6–23)
BUN: 12 mg/dL (ref 6–23)
Calcium: 8.6 mg/dL (ref 8.4–10.5)
Calcium: 9 mg/dL (ref 8.4–10.5)
Chloride: 104 mEq/L (ref 96–112)
Creatinine, Ser: 0.81 mg/dL (ref 0.4–1.2)
GFR calc Af Amer: >60 mL/min (ref >60)
GFR calc non Af Amer: >60 mL/min (ref >60)
Glucose, Bld: 98 mg/dL (ref 70–99)
Potassium: 3.1 mEq/L — ABNORMAL LOW (ref 3.5–5.1)
Sodium: 136 mEq/L (ref 135–145)

## 2011-03-30 LAB — URINALYSIS, ROUTINE W REFLEX MICROSCOPIC
Glucose, UA: NEGATIVE mg/dL
Hgb urine dipstick: NEGATIVE
Protein, ur: NEGATIVE mg/dL
Specific Gravity, Urine: 1.023 (ref 1.005–1.030)
pH: 6 (ref 5.0–8.0)

## 2011-03-30 LAB — CBC
HCT: 35.5 % — ABNORMAL LOW (ref 36.0–46.0)
MCV: 89.8 fL (ref 78.0–100.0)
Platelets: 137 10*3/uL — ABNORMAL LOW (ref 150–400)
Platelets: 173 10*3/uL (ref 150–400)
RBC: 4.27 MIL/uL (ref 3.87–5.11)
RDW: 13.7 % (ref 11.5–15.5)
WBC: 9.6 10*3/uL (ref 4.0–10.5)
WBC: 9.9 10*3/uL (ref 4.0–10.5)

## 2011-03-30 LAB — POCT CARDIAC MARKERS
CKMB, poc: 1.2 ng/mL (ref 1.0–8.0)
Myoglobin, poc: 53.8 ng/mL (ref 12–200)
Myoglobin, poc: 48.1 ng/mL (ref 12–200)
Troponin i, poc: <0.05 ng/mL (ref 0.00–0.09)

## 2011-03-30 LAB — POCT I-STAT, CHEM 8
BUN: 18 mg/dL (ref 6–23)
Chloride: 101 mEq/L (ref 96–112)
Creatinine, Ser: 0.9 mg/dL (ref 0.4–1.2)
Potassium: 3 mEq/L — ABNORMAL LOW (ref 3.5–5.1)
Sodium: 140 mEq/L (ref 135–145)

## 2011-03-30 LAB — POCT PREGNANCY, URINE: Preg Test, Ur: NEGATIVE

## 2011-03-30 LAB — DIFFERENTIAL
Basophils Absolute: 0.1 10*3/uL (ref 0.0–0.1)
Eosinophils Relative: 0 % (ref 0–5)
Lymphocytes Relative: 27 % (ref 12–46)
Lymphs Abs: 2.6 10*3/uL (ref 0.7–4.0)
Neutro Abs: 6.3 10*3/uL (ref 1.7–7.7)

## 2011-03-30 LAB — D-DIMER, QUANTITATIVE: D-Dimer, Quant: 0.27 ug/mL-FEU (ref 0.00–0.48)

## 2011-03-30 LAB — MAGNESIUM: Magnesium: 1.2 mg/dL — ABNORMAL LOW (ref 1.5–2.5)

## 2011-04-02 ENCOUNTER — Telehealth: Payer: Self-pay | Admitting: Internal Medicine

## 2011-04-02 NOTE — Telephone Encounter (Signed)
Pt's mother calling re wanting to see dr Graciela Husbands again after 5 yrs, pt's mom wants to check to make sure he would be willing to see her again since he had referred pt [facility], however they have dismissed her as a pt

## 2011-04-02 NOTE — Telephone Encounter (Signed)
Ok to schedule pt with Dr Graciela Husbands. Will forward back to [facility] to schedule

## 2011-04-24 ENCOUNTER — Emergency Department (HOSPITAL_COMMUNITY)
Admission: EM | Admit: 2011-04-24 | Discharge: 2011-04-24 | Disposition: A | Discharge status: Home / Self Care | Payer: Medicaid Other | Attending: Emergency Medicine | Admitting: Emergency Medicine

## 2011-04-24 DIAGNOSIS — Y849 Medical procedure, unspecified as the cause of abnormal reaction of the patient, or of later complication, without mention of misadventure at the time of the procedure: Secondary | ICD-10-CM | POA: Insufficient documentation

## 2011-04-24 DIAGNOSIS — Z950 Presence of cardiac pacemaker: Secondary | ICD-10-CM | POA: Insufficient documentation

## 2011-04-24 DIAGNOSIS — G809 Cerebral palsy, unspecified: Secondary | ICD-10-CM | POA: Insufficient documentation

## 2011-04-24 DIAGNOSIS — M549 Dorsalgia, unspecified: Secondary | ICD-10-CM | POA: Insufficient documentation

## 2011-04-24 DIAGNOSIS — T8089XA Other complications following infusion, transfusion and therapeutic injection, initial encounter: Secondary | ICD-10-CM | POA: Insufficient documentation

## 2011-04-27 ENCOUNTER — Encounter: Payer: Medicaid Other | Admitting: Internal Medicine

## 2011-04-27 NOTE — H&P (Signed)
NAMELATRELL, REITAN NO.:  [id number]   MEDICAL RECORD NO.:  [id number]          PATIENT TYPE:  INP   LOCATION:  1846                         FACILITY:  [facility]   PHYSICIAN:  Salvadore Farber, MD  DATE OF BIRTH:  03/11/1981   DATE OF ADMISSION:  04/26/2007  DATE OF DISCHARGE:                              HISTORY & PHYSICAL   CHIEF COMPLAINT:  Lightheadedness.   HISTORY OF PRESENT ILLNESS:  Ms. Sroka is a 30 year old lady with a  history of an atrial tachycardia and autonomic dysfunction in the  setting of cerebral palsy.  She has undergone several prior attempts at  ablation of an atrial tachycardia, most recently on Apr 24, 2007.  After  that procedure, she was asked to hold her nodal agents and discharged  home.  Over the past day, she has been very lightheaded, though without  any loss of consciousness.  She has had no bleeding from her access  sites.  She has had no fever.   PAST MEDICAL HISTORY:  1. 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  2. Autonomic dysfunction with postural orthostasis.  3. Cerebral palsy.  4. Anxiety.  5. Depression.  6. Multiple orthopedic surgeries.   ALLERGIES:  CYMBALTA, NEURONTIN.   CURRENT MEDICATIONS:  1. Aspirin 325 mg daily.  2. Singulair 10 mg daily.  3. Magnesium oxide 2400 mg daily.  4. Procrit two times per week.  5. Allegra.   SOCIAL HISTORY:  The patient lives in [place], [place], with  her mother.  She is disabled.  No alcohol, tobacco or illicit drug use.   FAMILY HISTORY:  Father has coronary disease at premature age.  Her  siblings and mother are alive and well.   REVIEW OF SYSTEMS:  Negative in detail except as above.   PHYSICAL EXAMINATION:  VITAL SIGNS:  Heart rate 49, blood pressure 77/50  at firehouse and now 88/60 here, oxygen saturation 99% on room air.  GENERAL APPEARANCE:  She is mildly ill-appearing, in no distress.  HEENT:  Normal.  SKIN:  Somewhat  pale, otherwise normal.  MUSCULOSKELETAL:  Exam is normal.  The access sites in both groins in  her right internal jugular sites are all without hematoma and  ecchymosis.  LUNGS:  Respiratory effort is normal.  Lungs are clear to auscultation.  CARDIOVASCULAR:  She has a nondisplaced point maximal cardiac impulse.  She has a regular rate and rhythm without murmur, rub or gallop.  ABDOMEN:  Soft, nondistended, nontender.  There is no  hepatosplenomegaly.  Bowel sounds are normal.  EXTREMITIES:  Warm without clubbing, cyanosis, edema, or ulceration.  NEUROLOGIC:  Carotid pulses 2+ bilateral without bruit.  She is alert  and oriented x3.  Mental status is at baseline per her mother.   Electrocardiogram demonstrates junctional rhythm at 49 beats per minute.  Electrocardiogram is otherwise normal.   IMPRESSION/RECOMMENDATIONS:  The patient now with symptomatic  bradycardia in the setting of junctional rhythm two days after ablation  of an atrial focus.  We are waiting records from [facility] EP where this was  performed.  We will check echo to exclude pericardial effusion, though I  doubt it.  We will also check CBC to exclude anemia, though I doubt it.  I think her bradycardia is the issue.  This may be due to some edema  which may have resolved.  Will admit her to the step-down unit and  monitor her.  Will continue to hold all nodal agents.      Salvadore Farber, MD  Electronically Signed     WED/MEDQ  D:  04/26/2007  T:  04/26/2007  Job:  [id number]   cc:   [facility] Salvia, MD, [facility]  [name], [name]  Dr. Mary Sella

## 2011-04-27 NOTE — Discharge Summary (Signed)
NAMEMADDELYN, ROCCA NO.:  [id number]   MEDICAL RECORD NO.:  [id number]          PATIENT TYPE:  INP   LOCATION:  3310                         FACILITY:  [facility]   PHYSICIAN:  Jesse Sans. Wall, MD, FACCDATE OF BIRTH:  Sep 19, 1981   DATE OF PROCEDURE:  DATE OF DISCHARGE:                    STAT - MUST CHANGE TO CORRECT WORK TYPE   PRIMARY CARDIOLOGIST:  [facility] Salvia, MD, [facility].   PRIMARY CARE PHYSICIAN:  [name], [name] at [facility].   PRIMARY DIAGNOSIS:  Symptomatic bradycardia (junctional rhythm).   SECONDARY DIAGNOSES:  1. Allergy or intolerance to CYMBALTA  and NEURONTIN.  2. Autonomic dysfunction with atriopathy and multiple atrial foci.  3. Status post sinoatrial node ablation at [facility] earlier this week.  4. Autonomic dysfunction with beta blocker hypersensitivity to      position changes and stress.  5. Hypomagnesemia.  6. History of hypokalemia.  7. Postural orthostatic tachycardia syndrome.  8. Persistent chest wall tenderness.  9. History of cerebral palsy.  10.Chronic back pain.  11.History of orthopedic surgeries.  No further details available.  12.History of bilateral hamstring and right heel cord release.  13.Allergy or intolerance to CYMBALTA and NEURONTIN.   HOSPITAL COURSE:  Ms. Cardy is a 30 year old female with a history of  atriopathy, who has had a right atrial crista tachycardia ablation in  September, 2007 and had a repeat EP study with radiofrequency catheter  ablation in October, 2007 prior to evaluation by Dr. Mary Sella.  She was  evaluated by Dr. Mary Sella, and it was felt that because of her  tachycardia, she would be best served by sinoatrial node ablation, which  was performed earlier this week, either May 12 or Apr 24, 2006.  Upon  discharge from [facility], her systolic blood  pressure was stable in the high 90s and low 100s, and her heart rate  was  a junctional rhythm in the 50s.  She was complaining of some slight  postural dizziness at that time, but it was felt that this was partly a  side effect of the medication that she had been given, and this should  resolve.   Today upon waking, Ms. Larocca complained of worsening dizziness and  stated that she could not get out of bed because of this.  Her mother  took her blood pressure at home, and her systolic blood pressure at one  point was 77/50 with a heart rate of 46.  Ms. Gloster was unable to move  herself, but her mother was able to get her in the car and took her to  the local fire department where these vital signs were confirmed.  Because of her symptoms, it was felt that she needed transport to the  closest hospital, and his was done.  She was admitted to [facility]  initially for further evaluation.   Her heart rhythm remained in the 50s and was a junctional rhythm.  She  received some fluid resuscitation and after approximately 600-700 cc  total, her blood pressure was stable in the 90s to  low 100s.  Her heart  rate was maintaining in the 50s, although sometimes it would drop into  the 40s.  Her symptoms had improved somewhat.   After being admitted to the step-down unit, Ms. Weseman had a 7-second  pause.  At the time, she was asymptomatic, and her systolic blood  pressure was approximately 100.  This was taken right after the episode.  Ms. Fatula did not note any new feelings of dizziness.  The strips were  reviewed, and it was felt that this was genuine.  She had a repeat EKG  done, and her rhythm is now slightly irregular.  The EKG is interpreted  as atrial fibrillation with a slight ventricular response.   [facility] was contacted, and after speaking with Dr. Joycelyn Das and Dr.  Kelvin Cellar, she was accepted in transfer.  They have a ready bed available  and will accept her in transfer this evening.  [facility] has been called  to transport her, and copies of her  records, including a DVD of her  echocardiogram, which was performed as a STAT chest in the emergency  room and a CD of her chest x-ray, are included.  She is being  transported with external pacer pads applied but not to be used unless  she has significant pauses or become more symptomatic.      Theodore Demark, PA-C      Jesse Sans. Daleen Squibb, MD, [facility]  Electronically Signed    RB/MEDQ  D:  04/26/2007  T:  04/26/2007  Job:  [id number]   cc:   [facility] Salvia, MD, [facility]  [name], [name]  Dr. Mary Sella at [facility]

## 2011-04-30 NOTE — Discharge Summary (Signed)
[name], [name] NO.:  [id number]   MEDICAL RECORD NO.:  [id number]          PATIENT TYPE:  INP   LOCATION:  3312                         FACILITY:  [facility]   PHYSICIAN:  Tereso Newcomer, P.A.     DATE OF BIRTH:  03-17-1981   DATE OF ADMISSION:  07/10/2006  DATE OF DISCHARGE:  07/12/2006                                 DISCHARGE SUMMARY   REASON FOR ADMISSION:  Tachycardia.   DISCHARGE DIAGNOSES:  1.  Tachycardia (supraventricular tachycardia-question sinus tachycardia      versus SANRT versus atrial tachycardia).  2.  Chronic neuropathic pain.  3.  Anxiety.  4.  Cerebral palsy.  5.  Hypokalemia.   BRIEF HISTORY:  [name] is a very pleasant 30 year old female patient  with a history of cerebral palsy and sinus tachycardia who was discharged on  June 23, 2006 after an admission for tachycardia.  At that time it was felt  that her sinus tachycardia was secondary to chronic pain and anxiety  secondary to cerebral palsy exacerbated by recent initiation of Cymbalta  therapy.  She underwent tilt table testing at that time that showed findings  consistent with autonomic dysfunction.  Her Cymbalta was discontinued at  that time.  It was thought that the Cymbalta was increasing her  norepinephrine which was exacerbating her tachycardia.  Her heart rate  seemed to be better controlled with this.  She had a recurrent episode of  tachycardia that was symptomatic on July 10, 2006 and was admitted for  further evaluation.   HOSPITAL COURSE:  The patient's beta blocker was up titrated to Toprol XL  150 mg twice a day.  Dr. Diona Browner placed the patient on Florinef and asked  electrophysiology to see the patient.  Dr. Clide Cliff saw the patient on July  31st.  The etiology of her tachycardia was unclear.  Dr. Clide Cliff noted that he  did not think there was a need for antiarrhythmic drug but maybe worth a  try.  He was also not sure what could be achieved by proceeding radio  frequency catheter ablation but we could certainly slow down her heart rate.  Dr. Clide Cliff felt that the patient could be discharged to home.  She was in  stable condition.  Dr. Clide Cliff will follow the patient back up in about 3  weeks to discuss options for treatment.  He recommended continue on Florinef  b.i.d. and Toprol XL 150 mg twice a day.  The patient was started on 24 hour  urine to rule out the possibility of pheochromocytoma by Dr. Diona Browner.  That  sample was lost and she will have this recollected as an outpatient.   LABORATORY DATA:  Sodium 141, potassium 2.9, chloride 107, CO2 29, glucose  99, BUN 7, creatinine 0.8.  Cardiac markers negative x3.  TSH 1.32, white  count 10,800, hemoglobin 12.7, hematocrit 36.8, platelet count 108,000.   DISCHARGE MEDICATIONS:  1.  Florinef 0.1 mg b.i.d.  2.  Toprol XL 150 mg b.i.d.  3.  Prozac 10 mg daily.  4.  Ativan p.r.n.   DIET:  No  restrictions.  She has been asked to increase potassium in her  diet.   FOLLOWUP:  With Dr. Clide Cliff in 3 weeks.  She will be followed up with Dr.  Diona Browner as scheduled.  The office will contact her for an appointment with  Dr. Clide Cliff.  She has been asked to go to the office tomorrow to pick up her  24 hour urine sample.  She will have her blood drawn tomorrow to be tested  for BMET to followup on her potassium.      Tereso Newcomer, P.A.     SW/MEDQ  D:  07/12/2006  T:  07/13/2006  Job:  [id number]   cc:   [name], [name]

## 2011-04-30 NOTE — Assessment & Plan Note (Signed)
[facility]                                   ON-CALL NOTE   [name], [name]                      MRN:          [id number]  DATE:07/02/2006                            DOB:          Oct 30, 1981    PRIMARY CARE PHYSICIAN:  [name], MD   PRIMARY CARE PHYSICIAN:  [name], MD   ELECTROPHYSIOLOGY PHYSICIAN:  Doylene Canning. [name], MD   REHABILITATION PHYSICIAN:  Caralyn Guile. Ethelene Hal, MD   NEUROLOGIST:  Bevelyn Buckles. Champey, MD   SUMMARY OF HISTORY:  [name] is a 30 year old white female well-known  patient to our practice, who has had problems with sinus tachycardia  secondary to chronic pain and anxiety associated with her cerebral palsy,  felt to be exacerbated by Cymbalta on her recent admission.  Her mother  called today, [name], stating that [name]'s pulse is up to the 140s.  She states her blood pressure has been in the 110s and 120s.  She states  that [name] is complaining of shortness of breath.  She is also at her  mother's house and is planning on taking [name] to the mall.   On review of her discharge medications, she has continued to take the Toprol  XL and has not missed any doses.  She continues to use the Lidoderm, and Dr.  Ethelene [name] has also prescribed Prozac; however, she did not fill the Ultram or  the Ativan and she is no longer taking Lexapro.   On speaking with her mother, she assured me that [name] took her medications  this morning.  Given her blood pressure and pulse, I asked her to take  Toprol XL 50 mg (a half of the 100 mg tablet).  I asked her to recheck her  blood pressure and pulse in approximately 2 hours to see if her heart rate  has improved.  She understands that.  I also suggested that she be  maintained in a relaxed position in a darkened room so that she may try to  relax as she did not have the Ativan filled to help with her anxiety.  Her  mother states that they are going to the mall.  I tried to  explain to  [facility] mother that by taking her to the mall this may just exacerbate her  feelings of anxiety and excitement, which would further exacerbate her  tachycardia.  Her mother states that she is taking her to the mall and she  will talk to [place] in a couple of hours.   My observation while taking to [name], in the background I could hear a  TV or radio playing very loudly and I could hear [name] talking very  excitedly in the background.                                   [name], PA-C  Doylene Canning. [name], MD   EW/MedQ  DD:  07/02/2006  DT:  07/02/2006  Job #:  [id number]   cc:   [name], MD  [name], MD  Doylene Canning. [name], MD  Richard D. Ethelene Hal, MD  Bevelyn Buckles. Nash Shearer, MD

## 2011-04-30 NOTE — Discharge Summary (Signed)
[name], CHINN NO.:  [id number]   MEDICAL RECORD NO.:  [id number]          PATIENT TYPE:  INP   LOCATION:  2035                         FACILITY:  [facility]   PHYSICIAN:  [name], P.A.     DATE OF BIRTH:  11/08/1981   DATE OF ADMISSION:  06/19/2006  DATE OF DISCHARGE:  06/23/2006                                 DISCHARGE SUMMARY   DATE OF ADMISSION:  06/19/2006   DATE OF DISCHARGE:  06/23/2006   PRIMARY CARDIOLOGIST:  [name], [name] [facility]   PRIMARY CARE PHYSICIAN:  [name], [name]   ELECTRO PHYSIOLOGIST:  [name], [name]   [facility] MEDICINE:  [name], [name]   NEUROLOGIST:  [name], [name]   REASON FOR ADMISSION:  Tachycardia.   DISCHARGE DIAGNOSES:  1.  Profound sinus tachycardia secondary to chronic pain and anxiety      secondary to cerebral palsy exacerbated by Cymbalta.  2.  Chronic neuropathic pain.  3.  Anxiety.  4.  Cerebral palsy.  5.  Atypical chest pain.  6.  Chronic low back pain with right lower extremity radiculopathy.   PROCEDURES PERFORMED:  This admission, had a tilt table testing by Dr. Sharrell [name].  Results:  No evidence of inducible syncope.  There was fairly  profound autonomic dysfunction as manifested by increase of heart rate with  head up tilting.  Of note the heart rate remained at 175 range throughout  the time she was in the 70 degree head up tilt table position.  Mechanism of  her tachycardia was unclear but was presumed that there is at least a  component of inappropriate or decreased venous return being consistent with  orthostasis (perhaps a component of increased norepinephrine from her  ongoing use of Cymbalta.   CONSULTANTS THIS ADMISSION:  [name], [name] with physical medicine  and rehab service.   REASON FOR ADMISSION:  The patient is a 30 year old female with cerebral  palsy who came to the Emergency Room for the third time on 06/19/2006 with  recurrent  tachycardia and supraventricular tachycardia.  She had been  treated in the past with Adenosine, IV Diltiazem and IV Lopressor.  She had  been placed on Toprol as an outpatient and her dosages have been up  titrated.  She has also been placed on Digoxin.  She had seen [name] in  the past who suggested that her rhythm was probable atrial tachycardia  versus sinus tachycardia.  He commented that her EKGs were notable for sinus  tachycardia with long RP tachycardia.  The possibility of greater risk with  catheter ablation was discussed with the patient but the patient and her  mother opted for medical therapy.  In the ER on 06/19/2006 her heart rate  was in the range of 158.  She was admitted for further evaluation.   HOSPITAL COURSE:  [name] saw the patient again on 06/20/2006 in follow  up.  The etiology of her tachycardia was unclear and the patient was set up  for head up tilt table testing.  The patient continued to have profound  tachycardia.  This seemed to be exacerbated by anxiety.  She had heart rates  in the low 100 range but when she was approached by medical staff in her  room her heart rates went up into the 150s to 180s.  She began to develop  some right sided chest pain.  She had a D-Dimer performed that was negative  as well as a chest CT that was negative for pulmonary embolism.  It was felt  that this was probably chest wall pain.  She continued to have pain off and  on.  She went for her head up tilt table test on 06/21/2006 by [name].  The results are as noted above.  It was felt that her Cymbalta showed to  titrated to off and she should be treated volume expansion with IV hydration  and salt.  He also added Clonidine and Florinef and continued her beta  blocker and Digoxin.  Her orthostatic vital signs were followed.  Her blood  pressures remained low on 06/21/2006 in the 80s systolically but her heart  rate went from 87 lying to 119 standing.  She was  continued on the above  therapy.  Her orthostatic's again were noted to be abnormal on 06/22/2006  her heart rates going from 93 lying to 138 standing.  Physical medicine and  rehab was asked to the patient for pain management.  [name] saw the  patient.  He noted that the patient had seen Dr. Ethelene [name] in the past for  spondylosis and radiculopathy and she has had epidural steroid injections in  the past as well.  She has failed Neurontin and Lyrica therapy in the past  among other medications.  [name] recommended Lexapro in place of the  Cymbalta as it would have the SSRI effects but decrease her norepinephrine  levels.  He also recommended Ativan prn angiectasia and anxiety.  He also  recommended a trial of Ultram on a scheduled basis.  He suggested  considering a trial of Trileptal or Tegretol for radicular pain in the  future.  He called up on the patient on 06/23/2006 and the patient's mother  noted to [name] that she was not interested in having her take any more  antidepressants.  The difference was explained to her and she reconsidered  this and she was given a prescription for 10 mg q.h.s. at discharge.  He  also recommended scheduled Ultram at 50 mg bid and prn Ativan at 0.5 mg q 12  hrs prn.  He suggested the patient follow up with Dr. Ethelene [name] for further  recommendations as an outpatient.  [name] and [name] were in  agreement that the tachycardia was likely exacerbated by her Cymbalta.  Dr.  Ladona [name] followed up on the patient on 06/23/2006.  Her pulse was now at 70  and her blood pressure 122/76.  Final thoughts on this patient's case since  admission in that her SVT was secondary to sinus tachycardia induced by  chronic pain and anxiety from her cerebral palsy which was exacerbated by  her Cymbalta.  Her tachycardia improved as the Cymbalta washed out of her  system.  Orthostasis also played a role in increasing her tachycardia.  This will be treated as an  outpatient with increased fluid and salt intake.  At  discharge she was taken off of her Clonidine and Florinef and her Digoxin  and left on her Toprol.   On admission  white count was 7800, hemoglobin 12.9, hematocrit 38.1,  platelet count 130,000, sed rate 7, D-Dimer 0.32, INR 1.1.  From 06/19/2006  sodium 140, potassium 3.5, glucose 96, BUN 11, creatinine 0.7, total  bilirubin 0.8, alkaline phosphatase 55, AST 24, ALT 22, total protein 7,  albumin 4.3, calcium 9.2, TSH 0.972, free T4 1.11, digoxin level less than  0.2.  Chest CT from 06/20/2006 no pulmonary embolism.  No CT findings to  describe the patient's symptoms, scattered tiny pulmonary nodules likely  benign.  The patient has absence of known malignancy.  Chest x-ray from  06/13/2006 no reactive chest disease.   DISCHARGE MEDICATIONS:  1.  Toprol XL 100 mg daily.  2.  Singulair 10 mg daily.  3.  Lidoderm patch as directed.  4.  ___________ 50 mg bid.  5.  Ativan 0.5 mg one to two tablets every 12 hours prn.  6.  Lexapro 10 mg q.h.s..   Patient has been advised to stop her Cymbalta and Digoxin.   DIET:  She is to increase her salt and fluid into.   ACTIVITY:  She is to increase her activity slowly as tolerated.   WOUND CARE:  Not applicable.   FOLLOWUP:  With Dr. Diona Browner on 07/14/2006 at 2:15 pm.  She will follow up  with Dr. Ethelene [name] tomorrow as scheduled.  She will follow up with Dr. Nash [name]  in the next couple of weeks as scheduled as well.  She should follow up with  Dr. Christell Constant her primary care physician as directed.  She has been advised that  refills on her Ultram, Ativan, and Lexapro will be either through Dr. Ethelene [name]  or her primary care physician.      [name], P.A.     SW/MEDQ  D:  06/23/2006  T:  06/24/2006  Job:  [id number]   cc:   [name], [name]  Fax: [phone]   [name], [name]  Fax: [phone]   [name], [name]  Fax: [phone]

## 2011-04-30 NOTE — Discharge Summary (Signed)
NAMETIERNAN, MILLIKIN NO.:  [id number]   MEDICAL RECORD NO.:  [id number]          PATIENT TYPE:  INP   LOCATION:  3702                         FACILITY:  [facility]   PHYSICIAN:  Duke Salvia, MD, FACCDATE OF BIRTH:  November 11, 1981   DATE OF ADMISSION:  09/25/2006  DATE OF DISCHARGE:  09/27/2006                           DISCHARGE SUMMARY - REFERRING   DISCHARGE DIAGNOSIS:  Chronic discomfort of uncertain etiology/sinus  tachycardia exacerbated by the above.  Multiple ER visits with the above,  question compliance with medications.   LABORATORY DATA:  Admission weight was 40.7 kg.  H&H 12 and 35.1, normal  indices, platelets 249, WBCs 12.3.  Sodium 140, potassium 3.8, BUN 13,  creatinine 0.8, glucose 133.  Normal LFTs.  CK, MBs and troponins were  within normal limits x3.  Chest x-ray on the 14th showed stable chest, no  active disease.  Myoview without stress or pharmacological agent on the 17th  showed an EF of 58%.  Imaging was done at that time of chest discomfort.  There was noted some reduced activity in the anterior wall at rest and post  chest discomfort images suggest scar; however, there is no finding  suggestive of ischemia.  EKG showed baseline artifact, normal sinus rhythm,  rightward axis, early R-wave, no acute changes, early repolarization.   HOSPITAL COURSE:  Ms. Forquer was admitted to [facility].  Dr.  Diona Browner notes on admission her 24-hour urine results are pending that had  been performed prior to admission.  It was felt that given her continued  pain a stress test should be performed.  Myoview was performed with the  previously mentioned results without difficulty.  Given her repeated ER  visits for pain and tachycardia, neuro was asked to consult.  She Champey  placed notes on the chart from recent visits and recommended increasing her  Prozac to 40 mg p.o. daily and to continue her Vicodin and Ativan as needed.  He agreed with sending  her to [facility] for autonomic .  He recommended follow-up  in his office after discharge and asked the patient to call.  On October 18,  Dr. Graciela Husbands reviewed information obtained during her admission.  He arranged  follow-up at [facility] for autonomic testing and stated that he will see her  after her [facility] evaluation.  He recommended her to continue on her home  medications except to discontinue flecainide and the Indocin.  December.   DISPOSITION:  She is asked to avoid caffeine.   ACTIVITY AND WOUND CARE:  Are not restricted or applicable.   She was asked to bring all her medications to appointments and to take  medications as prescribed.  1. She was asked to increase her Prozac to 40 mg daily.  2. The remainder of her medications include Inderal 60 mg daily.  3. Cardizem CD 120 daily.  4. Florinef 50 mg daily (according to nursing notes she was not taking      this medication prior to admission despite being prescribed).  5. Magnesium 400 mg b.i.d.  6. Ativan 0.5 b.i.d.  7. K-Dur 40  mEq t.i.d.  8. Singulair 10 mg daily.  9. Protonix 40 mg daily.  10.Vicodin as needed as previously.   She was asked to call Dr. Graciela Husbands for a follow-up appointment after her [facility]  evaluation.  She is asked to call Dr. Nash Shearer to arrange follow-up  appointment post her discharge.  She was also advised not to take her  flecainide or any nonsteroidal.  Discharge time greater than 30 minutes.     ______________________________  [name], PA-C    ______________________________  Duke Salvia, MD, [facility]    EW/MEDQ  D:  09/29/2006  T:  09/30/2006  Job:  [id number]   cc:   Duke Salvia, MD, [facility]  Tammy R. Collins Scotland, [name]  Bevelyn Buckles. Nash Shearer, [name]

## 2011-04-30 NOTE — Assessment & Plan Note (Signed)
[facility]                                   ON-CALL NOTE   TAGEN, BRETHAUER                      MRN:          [id number]  DATE:08/27/2006                            DOB:          [birth year]    ON CALL NOTE:  Date of call August 27, 2006.  Approximate time 1420.   HISTORY:  Hermine Feria is a 30 year old white female who is very well known  to our service with a history of  SVTs.  She was recently discharged post  ablation.  Her mother calls today and states that Estrella does not feel well  and she noticed that her heart rate was 130.  She states that Lindyn has  taken all of her medications this morning and they had been doing various  activities this morning.  She is also slightly upset, but denies any  specific symptoms such as shortness of breath, chest discomfort or  dizziness.  She states that they checked her pulse several times over the  last hour and it has ranged from 109 to 130.  She states that her blood  pressure reads between 120s and 130s.  On review of her medications I  explained to Ms. Minich that it would be best for Melaney to reduce her  activity level, to try to lay down and relax, which will also help to bring  her heart rate down.  Given her blood pressure I also explained that it  would be safe for her to take an additional 50 mg of Toprol with continuing  monitor of her blood pressure and pulse.  Her mother stated that she will  have her take a Toprol but they have many more things to do today, that they  will return home for her to relax when they get a chance.                                   [name], PA-C   EW/MedQ  DD:  08/27/2006  DT:  08/29/2006  Job #:  [id number]

## 2011-04-30 NOTE — H&P (Signed)
[name], [name] NO.:  [id number]   MEDICAL RECORD NO.:  [id number]          PATIENT TYPE:  OBV   LOCATION:  1823                         FACILITY:  [facility]   PHYSICIAN:  [name], MD DATE OF BIRTH:  1981/11/29   DATE OF ADMISSION:  09/25/2006  DATE OF DISCHARGE:                                HISTORY & PHYSICAL   REASON FOR ADMISSION:  Recurrent palpitations, insomnia and breathlessness.   HISTORY OF PRESENT ILLNESS:  [name] is a 30 year old woman well-known  to our practice who was recently discharged from the hospital on September 19, 2006, following a repeat electrophysiology study. 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 I reviewed her discharge summary which  indicates that her heart rate was fairly well-controlled, described as being  consistently below 100 beats per minute while she was being managed with  diltiazem CD 120 mg p.o. daily, Inderal 60 mg p.o. daily and flecainide 50  mg p.o., b.i.d. She was discharged home on all these medicines with the  exception of flecainide and was to see Dr. Graciela Husbands back in the office this  Wednesday for further evaluation. She was also noted to have hypokalemia and  hypomagnesemia, both of which were being repleted.   [name] and her mother have called in, it looks like at least a few  times, given recurrent palpitations since her discharge as well as  breathlessness and an episode of diaphoresis. She came in to the emergency  department today with heart rates in the 130s that was treated with  intravenous Cardizem by the emergency department staff. We were asked to see  her. In speaking with her and her mother they adamantly confirm that they  have been taking the  medications as directed, have not missed any doses.  They do state that [name] has been more active at home and perhaps this has  somehow exacerbated her tachycardia. She is very uncomfortable today and we  discussed the situation regarding either empiric adjustments in her therapy  with discharge home versus an observation admission with subsequent  titration on telemetry. They were more comfortable with observation.   ALLERGIES:  1. CYMBALTA.  2. NEURONTIN.   Past medical history, family history and social history have all been  reviewed recently. Please see recent history and physical from September 10, 2006.   MEDICATIONS:  As of the discharge summary include:  1. Diltiazem CD 120 mg p.o. daily.  2. Inderal 60 mg p.o. daily.  3. Indomethacin 25 mg p.o., q.8 hours for a 7-day course.  4. Protonix 40 mg p.o. daily for a 7-day course.  5. Potassium 40 mEq p.o., b.i.d.  6. Magnesium oxide 400 mg p.o., b.i.d.  7. Prozac 20 mg p.o. daily.  8. Singulair 10 mg p.o. daily.  9. She was also given a prescription for flecainide 50 mg p.o., b.i.d. but      was  told not to fill it as yet.   REVIEW OF SYSTEMS:  As per HPI. Otherwise negative.   PHYSICAL EXAMINATION:  VITAL SIGNS:  The patient is afebrile. Heart rates  are in the 130s down to 110 while seated in the bed. Respiratory rate 24.  Oxygen saturation is in the high 90s.  GENERAL:  This is a disheveled young woman, somewhat animated, denying any  active chest pain.  NECK:  Reveals no elevated jugular venous pressure or loud bruits.  LUNGS:  Clear without labored breathing.  CARDIAC:  Reveals a tachycardic regular rhythm with no S3 gallop or  pericardial rub.  ABDOMEN:  Soft.  EXTREMITIES:  Show no pitting edema. She has an abrasion and probably  superficial excoriation on her knee. There is no lower extremity edema.   LABORATORY DATA:  Hemoglobin 11.2, WBCs 8.8, platelets 225. Potassium 3.8,  BUN 14, creatinine 1, magnesium  1.2 down from 2.1. Parathyroid hormone 15.4  (at the low end of normal). Calcium 9.3.   Electrocardiogram from September 23, 2006, showed sinus tachycardia with a  rightward axis.   IMPRESSION:  1. Recurrent tachycardia associated with palpitations, fatigue, insomnia      and breathlessness. 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 She      seems to have been well-controlled on the medical regimen listed in the      discharge summary. She was on flecainide although has not been on this      as an outpatient and her electrolyte abnormalities are beginning to      resurface despite reportedly taking her medications as directed without      any missed doses.  2. History of cerebral palsy.   PLAN:  I discussed the situation with the patient and her mother. I offered  them the option of adjusting medical therapy as an outpatient and following  up with Dr. Graciela Husbands on Wednesday versus an observation admission on telemetry  with subsequent medication adjustments. They were very concerned about Ms.  [name]'s present symptoms and opted for observation. I was not able to  discuss the situation with Dr. Graciela Husbands today but will obviously involve him in  the long-term treatment options. It may be that antiarrhythmic therapy was  the difference during her recent hospital stay. Will otherwise plan to  repleat electrolytes.      [name], MD  Electronically Signed     SGM/MEDQ  D:  09/25/2006  T:  09/25/2006  Job:  [id number]   cc:   Duke Salvia, MD, [facility]

## 2011-04-30 NOTE — Discharge Summary (Signed)
NAMEABRISH, [name], [name] LHCDATE OF BIRTH:  [birth year]   DATE OF ADMISSION:  06/13/2006  DATE OF DISCHARGE:  06/15/2006                           DISCHARGE SUMMARY - REFERRING   SUMMARY/HISTORY:  [name] is a 30 year old white female who was referred  to the emergency room on June 13, 2006 for palpitations.  She was seen over  the previous weekend on June 11, 2006 in consultation secondary to SVT and  she was placed on medications and asked to be seen in the office on Monday.  On her way to the office, she developed reoccurring palpitations.  She was  briefly seen in the office and her heart rate was as high as 180.  She was  referred to the emergency room for further management.   PAST MEDICAL HISTORY:  Notable for cerebral palsy and supraventricular  tachycardia.   LABORATORY DATA:  Digoxin level was 1.1.  TSH 0.849, free T4 was 1.60, total  cholesterol 107, triglycerides 55, HDL 26, LDL 70.  BNP was 696.  CKMB's and  troponins were negative for myocardial infarction.  Initial BNP on June 07, 2006 was 1037.  Admission sodium was 135, potassium 470, BUN 29, creatinine  1.2, glucose 100.  H&H 11.5 and 34.9, normal indices, platelets 192, WBCs  13.8.  Chest x-ray on June 30,2007 did not show any active disease.  EKG on  presentation showed a narrow complex tachycardia with a short PR interval  right axis deviation.  Subsequent EKG showed normal sinus rhythm.   HOSPITAL COURSE:  [name] was admitted to [facility].  In the  emergency room, she was treated with 5 mg intravenous Lopressor every 5  minutes x3 which improved her tachycardia.  EP consultation was sought with  [name] and [name], [name]  Ablation was discussed with the  patient and her mother and they would  like to hold off on this measure at  this time and would  like to continue to try adjustment of medications.  She  was placed on 100 mg of Toprol XL and digoxin was added.  Her twice a day  metoprolol was discontinued.  Dr. [name] felt that she should remain in the  hospital for 2 days following initiation of these new medications.  By June 15, 2006, she had  not had any further episodes and it was felt that she  could be discharged home.   DISCHARGE DIAGNOSIS:  Supraventricular tachycardia.   DISPOSITION:  [name] is discharged home.  Her activities are not  restricted.  She was asked to bring all medications to all appointments.  New medications include digoxin 0.125 mg daily and Toprol XL 100 mg daily.  She was given permission to continue Lidoderm patch, Singulair, and  Cymbalta.  She was asked to call Dr. Ival Bible office in the morning for a  4-week appointment.  She was advised no pseudoephedrine  or caffeine  products.  She was instructed not to take the metoprolol.      [name], [name]      Learta Codding, [name] [name]  Electronically Signed    EW/MEDQ  D:  06/15/2006  T:  06/15/2006  Job:  [id number]   cc:   Learta Codding, [name] [facility]  [address]  [address]  [place]  [place] [address]   [name], [name]  Fax: [phone]   [name], [name] [facility]  [address]  [place]  [place] [address]

## 2011-04-30 NOTE — Discharge Summary (Signed)
[name], [name] NO.:  [id number]   MEDICAL RECORD NO.:  [id number]          PATIENT TYPE:  INP   LOCATION:  4738                         FACILITY[facility]  [facility]   PHYSICIAN:  Duke Salvia, MD, FACCDATE OF BIRTH:  01-Mar-1981   DATE OF ADMISSION:  09/08/2006  DATE OF DISCHARGE:  09/09/2006                                 DISCHARGE SUMMARY   She has allergies to CYMBALTA and NEURONTIN.  This discharge 30 minutes.   PRINCIPAL DIAGNOSES:  1. Hypomagnesemia this admission with a magnesium value of 1.0 and      hypokalemia with a value of potassium 3.0 (I am not sure I have ever      seen a magnesium value this low).  The D-dimer on September 04, 2006      was 3.14.  Of note, prior to this admission, the patient had been      prescribed magnesium oxide 400 mg twice daily.  She had told the nurses      on admission that she refused to take the magnesium and perhaps she had      not been taking it at home either.  I will check with Dr. Graciela Husbands prior      to discharging this patient as to what he feels should be done with the      magnesium and potassium as well as the D-dimer of 3.14.  2. Admitted with symptomatic persistent atrial tachyarrhythmia.      a.     fatigue/trouble sleeping/anorexia.  3. Long history of atrial tachyarrhythmias.      a.     Status post [facility] mapping/electrophysiology study/radiofrequency       catheter ablation of right atrial cristal tachycardia August 22, 2006.      b.     Recurrence of atrial arrhythmia within 48 hours of ablation       procedure.   SECONDARY DIAGNOSES:  1. Cerebral palsy.  2. Multiple orthopedic procedures.  3. Anxiety/depression.   No procedures this admission.   BRIEF HISTORY:  [name] is a 30 year old female with history of  symptomatic atrial arrhythmias.  She underwent electrophysiology study with  ESI mapping and radiofrequency catheter ablation of a right atrial cristal  tachycardias August 22, 2006.  The study was done under general  anesthesia.  The tachycardia was reduced from 150 beats per minute to a  sinus tachycardia of 90-100 beats per minute.  She had postprocedure  hypotension and nausea which resolved by her discharge date on August 24, 2006.  On discharge, her heart rate was in the 80-85 beats per minute and  because of hypotension her Toprol dose which was 150 b.i.d. was reduced to  50 mg daily.   She has had recurrence of high heart rates as early as 48 hours after  discharge.  She presented with tachycardia in the emergency room Sunday,  August 28, 2006.  Her Toprol was adjusted and she was discharged.  She  had an office visit with Dr. Graciela Husbands September 01, 2006 and an additional  outpatient procedure was scheduled for September 21, 2006.  A relook and  possible reablation procedure.  Since the office visit on the 20th, she has  been unable to sleep.  Her tachycardia and feeling of palpitation has been  incessant.  She has had anorexia.  She is very fatigued.  She presents to  the emergency room today September 08, 2006.  Heart rate is 146.  It looks  all the world for a sinus tachycardia.  It persists even at rest and the  patient is fatigued and somewhat discouraged as well as having trouble  sleeping.   HOSPITAL COURSE:  The patient was admitted from the emergency room with  rapid heart rate in the 140s.  She was started on IV Cardizem in the  emergency room at 5 mg per hour with some decrease in her heart rate into  the one teens, about 116 months 117.  She was seen in the emergency room by  Dr. Nona Dell who recommended the possibility of an antiarrhythmic  agents.  In conference with Dr. Graciela Husbands, it was decided that she would start  on flecainide 50 mg twice daily for an experimental period of time.  On the  evening of September 08, 2006, her heart rate has vacillated sometimes in  the teens sometimes even slower into the 9s and 90s.  Of note,  her  potassium on admission in the evening of September 08, 2006 was 3.0 and she  was given 40 mEq of potassium to bring it up to 3.8 with a follow-up study.  This is hard to gauge since her potassium on admission was 3.6 and she is  not on any potassium replacement therapy.  Despite Cardizem at 5 mg an hour  and flecainide, one dose given at midnight, the patient's heart rate did  climb into the 130s.  Her IV Cardizem was increased to 7.5 mg per hour and  the patient was started on Cardizem 60 mg every 8 hours.  At the time of  discharge, her heart rate is vacillating; however, it is generally speaking  below 100 beats per minute.  At the exact disposition on discharge, her  heart rate is 85.  She herself is a little bit longer glum and says that she  does not really feel much better even though her heart has slowed.  She will  discharge on the following medications.   1. She is to discontinue Toprol.  2. She is to start Cardizem 60 mg tablets three times daily; dosing at 8      o'clock in the morning, 4 in the afternoon and 10 o'clock in the      evening.  3. She is also started on Ativan 0.5 mg daily at bedtime.  4. She is to continue with Singulair 10 mg daily and her Prozac 20 mg      daily.   She will present to [facility] short stay as an outpatient on  September 21, 2006 for relook study with ESI mapping.   LABORATORY STUDIES:  This admission, on the morning of September 09, 2006:  Sodium was 136, potassium 3.8, chloride 103, bicarbonate 20, BUN is 8,  creatinine 0.7, glucose 106.  Complete blood work this admission:  Hemoglobin 12.1, hematocrit 35.1, white cells 6.3, platelets 193.  Alkaline  phosphatase is 54, SGOT is 24, SGPT is 22.  Magnesium was 1.0 on September 09, 2006 in the morning.     ______________________________  Caitlyn Mirza,  PA    ______________________________  Duke Salvia, MD, [facility]   GM/MEDQ  D:  09/09/2006  T:  09/12/2006  Job:   [id number]   cc:   Duke Salvia, MD, [facility]  [name], MD

## 2011-04-30 NOTE — Assessment & Plan Note (Signed)
[facility]                           ELECTROPHYSIOLOGY OFFICE NOTE   [name], [name]                      MRN:          [id number]  DATE:09/01/2006                            DOB:          Jan 06, 1981    [name] is seen.  She is status post ESI guided ablation of an atrial  tachycardia last week.  There is some question about whether this is an  inappropriate sinus tachycardia, and retrospectively it may well have been  as most of the rhythm was generated off the lateral wall.  However, what we  thought was sinus rhythm mapped more medially and this was clearly distinct.  During the procedure, she ended up leaving the lab with a heart rate of  about 100.  There was a flurry to 120 or 30 with catheter removal but by the  next day, the heart rate was settled in the 80 and 90 range.  She went home.  Unfortunately, over the last 6 days, she has had increasing problems with  her heart rate such that she ended up in the emergency room over the  weekend.  Her heart rate today is 150, and something has to get done.   Examination was unrevealing, apart from her heart rate.  Her blood pressure  is better today at 118/80, having been moderately aggravated by the  Florinef.   We discussed treatment options.  I think the most appropriate course is to  undertake repeat mapping and ablation with more extensive ablation, probably  on the roof of the right atrium in the proximal portion of the crista.  We  may need to also ablate further down the lateral wall to be more like  inappropriate sinus tachycardia ablation.   She and her Mom are agreeable and we will undertake it as soon as the  schedule permits.                                   Duke Salvia, MD, [facility]   SCK/MedQ  DD:  09/01/2006  DT:  09/03/2006  Job #:  [id number]   cc:   Tammy R. Collins Scotland, [name]

## 2011-04-30 NOTE — Assessment & Plan Note (Signed)
[facility]                              CARDIOLOGY OFFICE NOTE   Caitlyn, WAHLER                      MRN:          [id number]  DATE:07/06/2006                            DOB:          1981-07-04    PRIMARY CARE PHYSICIAN:  [name], MD   REASON FOR VISIT:  Follow up tachycardia.   HISTORY OF PRESENT ILLNESS:  [name] comes into the clinic today with  her mother.  I have reviewed her recent hospital stay and follow-up  telephone discussions.  She continues to report intermittent tachycardia  with sense of palpitations and anxiety as well as shortness of breath.  She  is not reporting any major chest pain at this point.  I reviewed her  discharge summary and recommendations by Dr. [name] as well as her tilt  table test.  At this particular time she is taking the medications outlined  below, has used some p.r.n. Toprol XL 50 mg tablets.  She has not yet seen  Dr. Nash Shearer with neurology but is scheduled for this in early August.  She  also has not filled Ultram or Ativan as suggested in her discharge summary.   I reviewed the situation again with the patient and her mother.  She has an  element of autonomic dysfunction and actually seemed to tolerate the  Florinef briefly while in the hospital.  In terms of her fluid intake, her  mother states that she drinks adequately but does not eat very much.  She  also seems to feel more anxious in the evenings.  I recommended for them  increasing the Toprol XL to 100 mg p.o. b.i.d., adding Florinef at 0.1 mg  p.o. daily, and having her fill the Ativan previously prescribed to use  p.r.n. in the evenings.  I have also recommended that they continue with  plan for follow-up with neurology.   Electrocardiogram today shows sinus tachycardia up to 153 beats per minute.   ALLERGIES:  NEURONTIN.   PRESENT MEDICATIONS:  1.  Lidoderm patch.  2.  Singulair.  3.  Prozac 10 mg p.o. daily.  4.   Toprol XL 100 mg p.o. daily with 50 mg p.r.n.   REVIEW OF SYSTEMS:  As described in the history of present illness.  She has  not had any syncope.   PHYSICAL EXAMINATION:  VITAL SIGNS:  Blood pressure is 118/88, heart rate  down to 120 after being still for a few minutes, weight is 93 pounds.  GENERAL:  The patient is in no acute distress.  NECK:  No obvious elevated jugular venous pressure or flutter waves.  LUNGS:  Clear.  CARDIAC:  A regular, rapid rate without loud murmur or rub.  There is no  peripheral edema.   IMPRESSION AND RECOMMENDATIONS:  1.  Supraventricular tachycardia, essentially sinus tachycardia as described      in the discharge summary.  This is most likely the result of autonomic      dysfunction as well as being exacerbated by anxiety.  She has been      evaluated by Dr.  [name] with our electrophysiology group and the plan at      this time is for medical therapy.  I have asked Ms. Kea to take      Toprol XL 100 mg p.o. b.i.d., begin Florinef 0.1 mg p.o. daily, and      proceed with filling her p.r.n. Ativan to be used in the evening with      anxiety.  I have also encouraged them to follow through with her      neurology visit and continue to see Dr. Christell Constant as needed.  2.  Plan at this point will be to have her follow up with [place] over the next 3      months.                                [name], MD    SGM/MedQ  DD:  07/06/2006  DT:  07/06/2006  Job #:  [id number]

## 2011-04-30 NOTE — Assessment & Plan Note (Signed)
[facility]                                   ON-CALL NOTE   KEENA, DINSE                      MRN:          [id number]  DATE:09/20/2006                            DOB:          [birth year]    DESCRIPTION AT CALL:  [name] is a young woman with cerebral palsy and  an atrial tachycardia.  She has had numerous ER visits for the above.  She  recently was admitted and underwent EP study and radiofrequency catheter  ablation of an atrial tachycardia by Dr. Graciela Husbands.  She was discharged home  today.  Her mother called tonight saying that her daughter woke her up,  saying that she was short of breath.  She went and took her blood pressure  which is 82/44 which was not significantly different from her blood pressure  in the hospital.  I hear the patient talking in the background of the call,  and she did not seem to be short of breath.  Her mother says she does not  appear significantly different to her either.  She did note that she was  mildly bradycardic with heart rates in the 40s and 50s.   DISPOSITION:  At this time, I think [name] is probably stable.  I have  told her mother obviously if she is getting worse that she needs to come  back to the emergency room for further evaluation, but at this point, I  think she can be observed at home and call the office in the morning if she  continues to have any problems.  We may need to stop her AV nodal blocking  drugs, if she is on any, given her bradycardia.       Bevelyn Buckles. Bensimhon, MD      DRB/MedQ  DD:  09/20/2006  DT:  09/21/2006  Job #:  [id number]

## 2011-04-30 NOTE — Discharge Summary (Signed)
[name], [name] NO.:  [id number]   MEDICAL RECORD NO.:  [id number]          PATIENT TYPE:  INP   LOCATION:  2015                         FACILITY:  [facility]   PHYSICIAN:  Duke Salvia, MD, FACCDATE OF BIRTH:  05/06/1981   DATE OF ADMISSION:  08/22/2006  DATE OF DISCHARGE:  08/24/2006                                 DISCHARGE SUMMARY   THIS PATIENT HAS ALLERGIES TO:  1. CYMBALTA.  2. NEURONTIN.     Dictation time and exam, 30 minutes.   PRINCIPAL DIAGNOSES:  1. Discharging day.  2. Status post electrophysiology study, radiofrequency catheter ablation      of right atrial cristal tachycardia under general anesthesia.  3. Persistent tachy palpitations.      a.     Symptoms are anxiety, dyspnea, chest pain.      b.     Unresponsive to large doses of beta blockers (Toprol 150 mg       b.i.d.).   SECONDARY DIAGNOSES:  1. Cerebral palsy.  2. Multiple orthopedic procedures.  3. Anxiety, depression.   PROCEDURE:  On August 22, 2006, electrophysiology study, radiofrequency  catheter ablation of right atrial cristal tachycardia by Dr. Sherryl Manges.  The patient's tachy palpitations have been greatly reduced from 150 beats  per minute to a sinus tachycardia of 90 to 100 beats per minute.  This is  improving on a daily basis.  At the time of discharge, the patient was  achieving sinus rhythm at 85 beats a minute.   BRIEF HISTORY:  [name] is a 30 year old female.  She has persistent  tachy palpitations.  There are accompanied by shortness of breath.  She also  has chest pain.  They last for hours and days at a time.  There is some  concern that this dysrhythmia has an atopic focus. It may also be __________  sinus tachycardia.  It is adrenergically responsive to a tilt table and  stress study.  It has not been significantly affected by Toprol 150 mg twice  daily or Florinef.   The role of the electrophysiology and radiofrequency catheter ablation  under  general anesthesia has been discussed with the patient and her mother.  This  will be done with endocardial mapping.  It is hard to tell what we will  accomplish but this is a viable option since drug therapy does not seem to  control these tachy palpitations.  The risks and benefits of the procedure  have been discussed with the patient.  She wishes to proceed.   HOSPITAL COURSE:  The patient presented electively August 22, 2006.  She  underwent endocardial mapping electrophysiology study with radiofrequency  catheter ablation of a right atrial cristal tachycardia.  Heart rate  effected in that it diminished into the teens at the time of the procedure.  She had some postprocedure hypotension and nausea.  The nausea had  diminished by postprocedure day #2 and the hypotension had improved.  The  patient would not tolerate her previous dose of 150 mg b.i.d. of beta  blocker.  Normally she tolerated 75 mg  b.i.d. of beta blocker, which was the  dose that was tried on postprocedure day #1.  She received only 75 mg of  that dose and therefore, the patient will go home on Toprol XL 50 mg daily.  This is a new dose.  The patient and her mother will discharge August 24, 2006.   She goes home on:  1. The new dose of Toprol XL 50 mg daily.  2. Singulair 10 mg daily.  3. Prozac 20 mg daily.  4. Magnesium oxide 400 mg twice daily.  5. Doxycycline 100 mg twice daily.  She is to stop Florinef and I think she knows this.   She will see Dr. Graciela Husbands in the office at [facility], [address] Wednesday, October 17 at 1:45.   LABORATORY STUDIES:  This admission:  Complete blood count:  Hemoglobin  12.3, hematocrit 35.4, white cells 8.3, and platelets 165.  Serum  electrolytes on admission were 137, potassium 3.1, chloride 99, carbonate  24, BUN 7, creatinine 0.8, glucose 101.  Her potassium was replenished such  that at the time of discharge her electrolytes on  September 12, were sodium  142, potassium 3.6, chloride 106, carbonate 30, BUN 6, creatinine 0.7, and  glucose 101.  PT this admission was 14.2, INR 1.1.     ______________________________  Maple Mirza, PA    ______________________________  Duke Salvia, MD, [facility]    GM/MEDQ  D:  08/24/2006  T:  08/25/2006  Job:  [id number]

## 2011-04-30 NOTE — Consult Note (Signed)
[name], [name] NO.:  [id number]   MEDICAL RECORD NO.:  [id number]          PATIENT TYPE:  EMS   LOCATION:  MAJO                         FACILITY:  [facility]   PHYSICIAN:  Caitlyn Codding, MD,FACC DATE OF BIRTH:  05/01/1981   DATE OF CONSULTATION:  07/17/2006  DATE OF DISCHARGE:                                   CONSULTATION   REASON FOR CONSULTATION:  Recurrent tachycardia in a patient with presumed  supraventricular tachycardia.   HISTORY OF PRESENT ILLNESS:  The patient is a 30 year old female patient of  Dr. McDowell's with cerebral palsy.  The patient has had several admissions  over the last several weeks with recurrent tachycardia.  The patient again  was brought by her mother to the emergency room due to excessive heart  rates, which according to her, were in the 150 to 160 range.  However, there  is significant anxiety on both the patient and the mother's part.  When the  patient was brought to the emergency room, the patient's heart rate was  actually only in the 130s, which appeared to be a normal sinus rhythm.  Interestingly, after I examined the patient and both mother and daughter  were alone in the examination room, the patient's heart rate actually came  down to the high 90s, approximately 100 beats per minute range.  The patient  also states that she is short of breath.  An x-ray currently is pending at  the time of this dictation.  The patient, however, did not have any  hypoxemia on room air by oxygen saturation.  Please refer to the prior H&Ps  for detailed evaluation of this patient and the plan as outlined by Dr.  Diona [name].  The patient also was seen by Dr. Graciela [name] recently.   MEDICATIONS:  1. Toprol XL 150 mg p.o. b.i.d.  2. Digoxin 0.125 mg daily.  3. Cymbalta was previously discontinued.  4. Singular.  5. Lidoderm patch.   PAST MEDICAL HISTORY:  Cerebral palsy, status post hamstring and heel  surgery in 1991.   SOCIAL HISTORY:  The  patient is the only child.  She lives with her mother.  She denies any alcohol or tobacco use.   FAMILY HISTORY:  Notable for mother at age 54, with no heart disease.  She  also has a half brother age 36, no heart disease.   REVIEW OF SYSTEMS:  As outlined above, but is limited due to the patient's  difficulty in articulating.  She denies any recent syncope, however.   PHYSICAL EXAMINATION:  VITAL SIGNS:  Blood pressure 117/80, pulse 110 beats  per minute.  NECK:  Normal carotid upstrokes, no carotid bruits.  LUNGS:  Clear bilaterally.  HEART:  Regular rate and rhythm, normal S1 and S2, no murmurs, rubs, or  gallops.  ABDOMEN:  Soft, nontender.  EXTREMITIES:  No cyanosis, clubbing, or edema.   LABORATORY DATA:  None obtained.  EKG showed sinus tachycardia.   PROBLEMS:  1. Recurrent tachycardia.      a.     Likely sinus tachycardia associated with anxiety.  2. Rule  out sinus node re-entry tachycardia, further followup per Dr.      Graciela [name].  3. Normal left ventricular function by echocardiogram.  4. Cerebral palsy.   PLAN:  1. The patient will be treated with an initial dose of Lopressor in the      emergency room.  We will give the patient's mother a prescription for      p.r.n. Lopressor, as well as anti-anxiety medications.  It is clearly      documented in the prior H&P from June 19, 2006, after Adenosine was      given that this tachycardia did not break.  There was transient slowing      consistent with sinus tachycardia.  However, heart rates less than 120      to 130 beats per minute likely do not require any further treatment      with beta blocker.  2. I have talked with the patient and particularly the patient's mother      and explained to her that they will need to follow up with Dr. Diona [name]      in the office.  There is no clear indication for admission at this      point in time.      Caitlyn Codding, MD,FACC  Electronically Signed     GED/MEDQ  D:   07/17/2006  T:  07/17/2006  Job:  [id number]   cc:   Caitlyn Sidle, MD

## 2011-04-30 NOTE — Discharge Summary (Signed)
[name], [name] NO.:  [id number]   MEDICAL RECORD NO.:  [id number]          PATIENT TYPE:  INP   LOCATION:  2015                         FACILITY:  [facility]   PHYSICIAN:  Maple Mirza, PA   DATE OF BIRTH:  [birth year]   DATE OF ADMISSION:  08/22/2006  DATE OF DISCHARGE:  08/24/2006                                 DISCHARGE SUMMARY   ALLERGIES:  CYMBALTA, NEURONTIN.   PRINCIPAL DIAGNOSES:  1. Discharging day #2 status post electrophysiology study/radiofrequency      catheter ablation of right atrial cristal tachycardia under general      anesthesia.  2. History of persistent tachy palpitations.      a.     Symptoms or anxiety/dyspnea/chest pain.      b.     Tachycardia unresponsive to large doses of beta  blocker (Toprol       150 mg b.i.d.).  3. Hypotension and nausea postprocedure  4. Hypokalemia, on admission, with a potassium of 3.1.  The patient has      had prior admissions and a prior evidence of hypokalemia.  With regard      to hypokalemia, the patient received 40 mEq for the initial potassium      of 3.1.  She received that on September 10.  Then, she received an      additional 40 mEq on September 11 for a potassium of 3.4.  Her      discharge potassium is 3.6.  The patient will discharge on potassium      chloride 10 mEq daily.  This is a new medication for her   SECONDARY DIAGNOSES:  1. Cerebral palsy.  2. Multiple orthopedics procedures.  3. Anxiety/depression.  The patient had echocardiogram in March 2007,      ejection fraction is 55-60%.  No left ventricular regional wall motion      abnormalities, no significant mitral regurgitation, trivial tricuspid      regurgitation.   PROCEDURES:  August 22, 2006, electrophysiology study, radiofrequency  catheter ablation of right atrial cristal tachycardia, Dr. Sherryl Manges.   BRIEF HISTORY:  [name] is 30 year old female.  She has persistent  tachy palpitations.  They are  accompanied by shortness of breath, chest  pain.  The episodes last hours and sometimes days that time.  She had been  hospitalized in the past for this.  It seems very resistant to beta blocker  therapy and to Florinef.  She has been tried on both of these.  The patient  has become hypertensive on Florinef, and in an office visit August 12, 2006,  this was discontinued.   To role of electrophysiology study, endocardial mapping and radiofrequency  catheter ablation, under general anesthesia was discussed with the patient  and her mother.  These current symptomatic tachycardias are very disabling,  and this seems a logical next step, given failure of drug therapy.  At the  time of this office visit, the tachycardia may be in an atopic focus or on  inappropriate sinus tachycardia.  Electrophysiology study will help  elucidate.  The risks  and benefits have been described to the patient, and  she wishes to proceed.   HOSPITAL COURSE:  The patient presented electively, September 10, to [facility].  She underwent electrophysiology study with ablation of a  right atrial cristal tachycardia.  From the outset, heart rates modulated,  and over the next 48 hours had actually decreased from a resting rate of 115-  85.  As mentioned above, the patient did have some nausea and hypotension  after the procedure.  Her blood pressure was such that she could not  tolerate even a reduced dose of Toprol of  75 mg b.i.d.  She did, however,  tolerate Toprol 75 mg, and will go home on Toprol 50 mg daily.  As mentioned  above, potassium has been supplemented 40 mEq daily, on September 10 and  September 11.  There is some thought that the loss of potassium was  exacerbated with some nausea after the procedure, but the patient did  present hypokalemic.   DISCHARGE MEDICATIONS:  1. Toprol 50 mg daily.  This is a new dose.  She is to take 1/2 of 100 mg      tablet daily.  2. Singulair 10 mg daily.  3.  Prozac 20 mg daily.  4. Magnesium oxide 400 mg twice daily.  5. Doxycycline 100 mg twice daily for slight infection at the left knee.  6. Potassium chloride 10 mEq daily.  This is a new medication.  7. The patient is to stop Florinef.   FOLLOWUP:  She has an appointment with [facility] for a  basic metabolic panel to check potassium level Tuesday, September 18 at 10  o'clock, and she will see Dr. Graciela Husbands, Wednesday, October 17 at 1:45 p.m.           ______________________________  Maple Mirza, PA     GM/MEDQ  D:  08/24/2006  T:  08/25/2006  Job:  [id number]   cc:   Olena Leatherwood [facility]

## 2011-04-30 NOTE — Assessment & Plan Note (Signed)
[facility]                                   ON-CALL NOTE   COLENE, MINES                      MRN:          [id number]  DATE:10/02/2006                            DOB:          [birth year]    Mrs. Tourigny called today about her daughter having persistent chest pain.   I admitted her this past week for recurrent chest pain.  Please see the  recent discharge summary.   She had a stress Myoview while she was having pain.  It showed some scar  tissue in the anterior wall, but normal wall function and an EF of 58%.  It  was felt to be non-ischemic.   She was referred to [facility] for autonomic workup.  She apparently was already  seen there on Friday.  They were there until like 8:00 getting IV magnesium  for low magnesium.   For the discharge summary I recommended today that she give Ms. Mcclenny  Vicodin and/or Ativan to calm her down and for the pain.  She says she does  not want to give her Vicodin or Ativan.  I told her I saw no utility in  bringing her to the emergency room today.  I asked her to call the office  early this week and also to followup with [facility].       Thomas C. Daleen Squibb, MD, [facility]      TCW/MedQ  DD:  10/02/2006  DT:  10/03/2006  Job #:  [id number]   cc:   Duke Salvia, MD, [facility]

## 2011-04-30 NOTE — Assessment & Plan Note (Signed)
[facility]                                   ON-CALL NOTE   [name], [name]                        MRN:          [id number]  DATE:  07/09/2006                              DOB:      09/13/1981    I received a call from Karie Chimera, mother of [name], that [name]  has been fairly anxious and agitated this morning.  Heart rate has been  between 120 and 150.  I have read through documents both on e-chart and the   one folder, and it appears that [name] has previously been diagnosed  with inappropriate sinus tachycardia, as well as SVT that seems to have been  aggravated by Cymbalta usage.  Caitlyn Cymbalta has subsequently been  discontinued and [name] is currently taking Toprol XL 100 mg, b.i.d.  Ms.  [name] tells me that [name] took 150 mg of Toprol this morning, secondary to  fast heart rates.  [name] is somewhat short of breath, but otherwise stable,  according to the patient's mother.  [name] tells me Caitlyn heart rate right now is  120.  This would make it seem pretty unlikely that [name] is in SVT.  I advised  that [name] take an Ativan at this point, to see if this is helpful in any way  and, if not, if [name] becomes more short of breath or more uncomfortable, that  they should have a very low threshold for coming into the emergency room for  evaluation.  [name] said that [name] would like to avoid the emergency room if at  all possible, and would get Ativan to see if by reducing [name]  anxiety, [name] can bring Caitlyn heart rate down and make Caitlyn more comfortable.                                   Nicolasa Ducking, ANP                                [name], MD   CB/MedQ  DD:  07/09/2006  DT:  07/09/2006  Job #:  [id number]

## 2011-04-30 NOTE — Assessment & Plan Note (Signed)
[facility]                                   ON-CALL NOTE   SHARETA, FISHBAUGH                        MRN:          [id number]  DATE:09/23/2006                            DOB:          1981-03-16    PRIMARY CARDIOLOGIST:  Duke Salvia, MD, [facility]   Ms. Abraha is a young woman with cerebral palsy and a history of atrial  tachycardia.  She was evaluated by Dr. Graciela Husbands and was found to have severe  atrophy with multiple atrial foci.  She had radiofrequency catheter ablation  on September 15, 2006, but continues to experience difficulties with elevated  heart rate.   Today, Ms. Missouri was at her grandmother's and states that she took two  doses of her metoprolol.  She began experiencing an elevated heart rate and  went to Urgent Care as well as to the emergency room.  In Urgent Care, she  was seen there for knee swelling and was diagnosed with a superficial knee  infection for which she was given antibiotics and pain control medications.  She was seen at [facility] Room for tachycardia and was given IV  beta-blocker which improved her symptoms.   After leaving the emergency room, Ms. Kinter was out with her mother  getting her prescriptions filled and began experiencing increased heart rate  and complaining of shortness of breath.  Her mother states that she has had  two Ativan tablets today, but they have not helped her symptoms very much.  She states that the Ativan is not sedating her and although she does not  express it directly, seems concerned that the dose is not high enough.   I discussed the situation with Ms. Boza's mother.  I advised her that it  was difficult to tell Veria to take more medication as when she does take  more medication and her heart rate becomes controlled, sometimes it is too  slow.  She indicated understanding of this.  I advised her that she should  take her home and try to make her comfortable and try to  get her to relax in  an effort to get her heart rate to come down by itself.  I advised her to be  compliant with all the medications as prescribed by Dr. Graciela Husbands and she stated  that she would do so.  I advised her that if Ms. Cozza continued to  experience difficulty, she might have to bring her back up to the emergency  room as the only safe way to give her increased amounts of beta-blockers or  calcium channel blockers was to have her on the monitor at the time.   Ms. Pelley mother state that she would take [place] home and watch her  carefully.  She stated she would bring her back to the hospital if  necessary, but would try giving her home medications and getting her to sit  at home and relax in the hopes that this would help her  symptoms.  Ms. Withers has followup with Dr. Graciela Husbands and she was encouraged  to  keep the appointments.      ______________________________  Theodore Demark, PA-C    ______________________________  Rollene Rotunda, MD, [facility]     RB/MedQ  DD:  09/23/2006  DT:  09/26/2006  Job #:  [id number]

## 2011-04-30 NOTE — Assessment & Plan Note (Signed)
[facility]ALTHCARE                                   ON-CALL NOTE   [name], [name]                      MRN:          [id number]  DATE:08/06/2006                            DOB:          02/07/1981    [name] is well-known to our service with ongoing close monitoring and  management of persistent tachycardia.  In fact, she was just recently seen  in the office on July 28, 2006, by Dr. Sherryl Manges.  I was paged by her  mother, [name], with whom I have spoke numerous times in the recent past,  particularly on these weekends.  By the time I was able to return or call;  however, there was only a voice recording.  Therefore, I suspect that she is  in route to the emergency with her daughter, as she has done multiple times  in the past.  Of note, the alpha-page does indicate tachycardia.                                   Gene Serpe, PA-C   GS/MedQ  DD:  08/06/2006  DT:  08/07/2006  Job #:  [id number]

## 2011-04-30 NOTE — Assessment & Plan Note (Signed)
[facility] HEALTHCARE                                   ON-CALL NOTE   LEROY, TRIM                      MRN:          [id number]  DATE:07/10/2006                            DOB:          Jun 16, 1981    Ms. Delima's mother, Avree Szczygiel, called again today.  I also spoke with  her yesterday regarding Fleeta's tachycardia.  Yesterday she gave her a dose  of Ativan and felt that this probably helped to calm Joseph down and  subsequently reduce her tachycardia and shortness of breath.  She says today  that she gave her an Ativan this morning and that she had done well.  Now  that they went over to a family member's house, Jamiya is again complaining  of some mild shortness of breath as well as chest discomfort.  I advised  that at this point it is probably best if they just come into the emergency  room for evaluation of the rhythm.  Macarena has previously had documented  sinus tachycardia as well as an episode of supraventricular tachycardia,  which was felt to be secondary to Cymbalta usage.  She is no longer using  Cymbalta.  Ms. Reinecke verbalized understanding and said she would likely be  bringing her daughter into the emergency department for evaluation.                                   Nicolasa Ducking, ANP                                [name], MD   CB/MedQ  DD:  07/10/2006  DT:  07/11/2006  Job #:  [id number]

## 2011-04-30 NOTE — Assessment & Plan Note (Signed)
[facility] HEALTHCARE                                   ON-CALL NOTE   TAMMY, WICKLIFFE                      MRN:          [id number]  DATE:09/27/2006                            DOB:          [birth year]    PHONE NUMBER:  [phone]   CARDIOLOGIST:  Duke Salvia, MD   HISTORY:  Ms. Longwell mother called the answering service tonight.  She  was discharged from the hospital earlier today.  She has a history of  palpitations and tachyarrhythmias as well as chronic chest pain.  This is  felt to have been noncardiac in the past.  She developed chest pain prior to  leaving the hospital earlier today.  It only got worse.  She turned around  and came back to the hospital this evening and was getting ready to walk  into the emergency room.   PLAN:  As noted above, the patient was getting ready to come into the  emergency room at [facility].  We will see her there.   DISPOSITION:  The patient will be seen in the emergency room and probably  readmitted to the hospital for further evaluation.      ______________________________  Tereso Newcomer, PA-C    ______________________________  Jesse Sans. Daleen Squibb, MD, [facility]     SW/MedQ  DD:  09/27/2006  DT:  09/29/2006  Job #:  [id number]

## 2011-04-30 NOTE — Letter (Signed)
October 21, 2006     RE:  XOLANI, DEGRACIA  MRN:  [id number]  /  DOB:  12/01/1981   Edsel Petrin, MD  [facility]  [facility] [id number]  [place], [place] [address]   Dear Ferne Reus,   Nur comes back in.  She is feeling some better. She is trying to deal with  her palpitations better. They continue to be a problem.  Her blood pressure,  however, is doing pretty well.  It was 115 today, and has done pretty well  at home, apparently also.   Her potassium level has apparently gone up and her potassium supplements  were discontinued.  She is being maintained on the magnesium supplements.  She awaits the results of the urine studies done that were sent to [facility]; and  has planned to see you in 3 weeks' time with the visit with the  endocrinologist around that same time.   Ron, thanks again for your wonderful help in dealing with her and she  certainly appreciated that.  They would like to have dual site followup just  because of proximity; and, however, I can help in that in the long-term will  be great.  I will plan to schedule her to see her in about 10 weeks from now  which would be about 5 weeks or so after you see her in December.  Have a  wonderful Thanksgiving.    Sincerely,     Duke Salvia, MD, [facility]  Electronically Signed   SCK/MedQ  DD: 10/21/2006  DT: 10/21/2006  Job #: [id number]

## 2011-04-30 NOTE — Assessment & Plan Note (Signed)
[facility] HEALTHCARE                                   ON-CALL NOTE   KERIE, BADGER                      MRN:          [id number]  DATE:08/07/2006                            DOB:          [birth year]    PROGRESS NOTE:  This is an on call note on a call that I received from her  mother earlier today. I had instructed her to call me approximately 1 hour  after given the half dose of Toprol 100, as I had directed. She just called  me a few moments ago to tell me that she had done this and that her  daughter's heart rate has, in fact, come down a little bit from the solid  130 range where she typically stays to about 110 or so right now. She is  still having some chest discomfort and we agree to keep treating with the  Tylenol. Also, as I had noted earlier, she is to fill the prescription that  she was given for Vicodin, to help her daughter ease her pain. I also re-  emphasized the importance of keeping her scheduled followup appointment with  Dr. Graciela Husbands this coming Friday.                                   Gene Serpe, PA-C   GS/MedQ  DD:  08/07/2006  DT:  08/08/2006  Job #:  [id number]

## 2011-04-30 NOTE — Op Note (Signed)
[name], [name] NO.:  [id number]   MEDICAL RECORD NO.:  [id number]          PATIENT TYPE:  INP   LOCATION:  2020                         FACILITY:  [facility]   PHYSICIAN:  Duke Salvia, MD, FACCDATE OF BIRTH:  [birth year]   DATE OF PROCEDURE:  09/15/2006  DATE OF DISCHARGE:                                 OPERATIVE REPORT   PREOPERATIVE DIAGNOSIS:  Atrial tachycardia.   POSTOPERATIVE DIAGNOSIS:  Severe atriopathy with multiple atrial foci.   PROCEDURES:  Invasive electrophysiology study, electroanatomical mapping and  radiofrequency catheter ablation.   SURGEON:  Duke Salvia, MD, [facility].   DESCRIPTION OF PROCEDURE:  Following the obtaining of informed consent, the  patient was brought to the electrophysiology laboratory and placed on the  fluoroscopic table in the supine position.  The patient was submitted to  general anesthesia under the care of Dr. Jacklynn Bue.  After routine prep and  drape, cardiac catheterization was performed.  At the end of the procedure,  the catheters were removed, hemostasis was obtained, and the patient was  transferred to the recovery room in stable condition.   CATHETERS:  A 5-French quadripolar catheter was inserted via the left  femoral vein to the AV junction to measure the His electrogram.  A 9-French  ESI array catheter was inserted via the left femoral vein to the high right  atrium.  A 7-French x 4-mm deflectable-tip ablation catheter was inserted  via the right femoral vein to mapping sites in the atrium.   Surface leads 1, aVF and v1 were monitored continuously throughout the  procedure.  Following insertion of the catheters, the stimulation protocol  included burst pacing.   Isoproterenol was administered.   RESULTS:  The initial rhythm was sinus with a cycle length of 679 msec.  The  PR interval was 117 msec.  QRS duration was 101 msec, QT interval 365 msec,  P-wave duration 85 msec.   AH interval 58 msec  and the HP interval was 44 msec.   The patient was then subjected to isoproterenol infusion, as well as  transient semi-awakening from her general anesthetic state.  The intention  was to try to get a faster heart rate.  We were able to achieve finally a  maximum heart rate of 145 beats per minute or so with a combination of  modest anesthesia and 3 mcg of isoproterenol.  This mapped to the very high  right atrium at the junction on the septal side of the SVC.  RF energy was  applied at this site with subsequent acceleration and then deceleration of  the heart rate to a heart rate of 125 beats per minute.  We had also  initially found prior to the afore-mentioned site that as the heart rate  changed initially from 87 msec, which appeared to be in the region of the  sinus node to 125, which originated from the lower crista to the higher  heart rates, a substrate map was obtained, suggesting that there was a  diffuse atriopathy.  This was subsequently confirmed.  Approximately 70% of  the atrium  has a voltage of less than 10% with a peak negative at 8.6 mV.   Following the ablation as mentioned above, further mapping with a heart rate  of 125 now with isoproterenol at 4 mcg per minute demonstrated diffuse sites  of origin for the atrial ectopic beats.  At this point, it was elected to  terminate further mapping.  The catheters were  removed.  Hemostasis was  obtained.  Isoproterenol was discontinued, and the patient was transferred  to the recovery room in stable condition.   COMPLICATIONS:  As we tried to awaken the patient to get her stimulated to  see what we could evoke from a rapid heart rate, she flexed her legs despite  my holding them down.  Because she had catheters in her groins, I pushed  hard down on her, particularly her right leg.  There was a crack.  I  reported this to the parents.  The mother says that she has had cracking  muscles in the past.  When the patient awakened,  there were no complaints  of right leg pain.  We will have to keep our eye on this.   Otherwise, the patient tolerated the procedure without apparent  complication.   The radiofrequency energy applications totaled 4 minutes 19 seconds.   The fluoroscopy time was 4 minutes 56 seconds.           ______________________________  Duke Salvia, MD, [facility]     SCK/MEDQ  D:  09/15/2006  T:  09/15/2006  Job:  [id number]

## 2011-04-30 NOTE — H&P (Signed)
NAMEPHELICIA, DANTES NO.:  [id number]   MEDICAL RECORD NO.:  [id number]          PATIENT TYPE:  INP   LOCATION:  2020                         FACILITY:  [facility]   PHYSICIAN:  Duke Salvia, MD, FACCDATE OF BIRTH:  [birth year]   DATE OF ADMISSION:  09/10/2006  DATE OF DISCHARGE:                                HISTORY & PHYSICAL   CHIEF COMPLAINT:  Fast heart rate, anxiety, and fatigue.   HISTORY OF PRESENT ILLNESS:  Ms. Mones is a 30 year old Caucasian woman  with a history of symptomatic atrial tachycardia status post ablation on  August 22, 2006, who presents with recurrent symptoms. She has had  several visits since her ESI ablation of the right atrial crystal  tachycardia.  She was initially on Toprol XL but was subsequently evaluated  over the last week for incessant tachycardia.  On her hospitalization  September 27 through September 09, 2006, Toprol XL was initially  discontinued, and IV Cardizem was initiated.  Two doses of Flecainide were  administered.  The patient's tachycardia improved with heart rate reportedly  to the 90s.  The patient continued to have symptoms of anxiety and fatigue  despite normalized heart rate. She was subsequently discharged on p.o.  Cardizem; however, last night the patient returned to the emergency room  with recurrence of symptoms but elected not to stay.  This morning after  receiving IV morphine during her emergency room visit last night, the  patient's mother noted decrease in patient's blood pressure, down initially  to the 90s and subsequently into the 70s systolic.  The patient did not note  any change in her mental status or symptoms despite the low blood pressure.  In light of recurrence of her symptoms, the patient presented back to our  emergency room for further management.  She continues to deny any chest pain  but states she has continued shortness of breath, fatigue, anxiety, and  anorexia.  She has taken  Cardizem and has been compliant with her other  medications including her Ativan which was recently initiated.  She denies  any orthopnea or paroxysmal nocturnal dyspnea.   PAST MEDICAL HISTORY:  Notable for:  1. Cerebral palsy.  2. Symptomatic atrial tachycardia status post ablation August 22, 2006,      for right atrial crystal tachycardia.  3. History of chronic back pain.  4. History of bilateral ham string and right heel cord releases.  5. History of other orthopedic surgeries.   ALLERGIES:  CYMBALTA and NEURONTIN.   MEDICATIONS:  1. Cardizem 60 mg p.o. 3 times a day.  2. Singulair 10 mg once a day.  3. Prozac 20 mg once a day.  4. Magnesium oxide 400 mg a day.  5. Potassium chloride 10 mEq a day.  6. Ativan 0.5 mg once at night.   SOCIAL HISTORY:  The patient lives in [place], [place], with  her mother.  She denies any tobacco, alcohol, or drug use.   FAMILY HISTORY:  Notable for asthma, father with a stroke, heart disease,  and diabetes.   REVIEW OF SYSTEMS:  Remarkable  as noted above including palpitations,  shortness of breath, weakness, anxiety, and anorexia.  The rest of the  Review of Systems were reviewed and are negative.   PHYSICAL EXAMINATION:  VITAL SIGNS: Temperature 98, pulse 125 to 132, blood  pressure 133/68, respiratory rate 20.  GENERAL: The patient is anxious but in no acute distress.  HEENT: Normocephalic and atraumatic.  Pupils equal, round, and reactive to  light.  NECK: Shows no JVD, no carotid bruits.  CARDIOVASCULAR: Regular rhythm, tachycardic, with a 1/6 high-flow systolic  murmur at the right upper sternal border.  LUNGS:  Clear to auscultation bilaterally.  ABDOMEN:  Positive bowel sounds, soft, nontender, nondistended.  EXTREMITIES:  Significant contractures in all four extremities consistent  with patient's underlying cerebral palsy.  NEUROLOGIC: Cranial nerves II-XII grossly intact and contractures as noted  above.   PSYCH:  Demonstrates significant anxiety, no suicidal ideation.  SKIN:  Demonstrates no rashes or lesions.   EKG demonstrates atrial tachycardia versus a sinus tachycardia with a short  PR interval, rate of 130.   Labs are pending.   Echocardiogram from March 2007 demonstrates trace mitral valve bowing  suggestive of mitral valve prolapse, ejection fraction of 55-60%, and no  wall motion abnormalities.   ASSESSMENT AND PLAN:  This is a 30 year old Caucasian woman with incessant  supraventricular tachycardia and a planned electrophysiology study on  September 21, 2006, who presents with recurrent symptoms in the setting of  significant anxiety.  1. Supraventricular tachycardia: At this time, we will initiate IV      Cardizem therapy as this was effective during her last admission.      Flecainide therapy will not be initiated for any interim period.  2. Anxiety.  Ativan will be increased to 0.5 mg p.o. b.i.d.  Prozac will      be continued.  3. Prophylaxis.  The patient will be started on subcutaneous heparin.  4. Disposition will be dependent on improvement in patient's symptoms      given her frequent admissions over the last 1 week.      Reginia Forts, MD  Electronically Signed     ______________________________  Duke Salvia, MD, [facility]    RA/MEDQ  D:  09/10/2006  T:  09/11/2006  Job:  [id number]

## 2011-04-30 NOTE — Assessment & Plan Note (Signed)
[facility] HEALTHCARE                                   ON-CALL NOTE   TIHANNA, GOODSON                      MRN:          [id number]  DATE:08/07/2006                            DOB:          1981-02-20    PRIMARY CARDIOLOGIST:  Dr. Sherryl Manges.   PHONE CALL NOTE:  Birdena's mother, Babette Relic, contacted me this afternoon  reporting that her daughter is having chest pain  They were both here in the  emergency room yesterday and were discharged after being treated for mild  hypokalemia.  Apparently, there was no change in her medication regimen.   I asked Tammy to take her daughters pulse rate - this was read to me as  being in the 130 range.  She is on Toprol XL 150 b.i.d. and is due to follow-  up with Dr. Graciela Husbands this coming Friday.  They did not take the medication as  they usually do at 8 a.m., but did take it approximately 3 hours ago.   I informed Tammy to give [name] 50 mg of Toprol now and to then check her  pulse in one hour and to give me a call.  I also advised her to fill the  prescription for Vicodin, as has been recommended several times, to provide  [facility] some pain relief.  In the meanwhile, she is to use extra strength  Tylenol which she has at home.                                   Gene Serpe, PA-C                                Duke Salvia, MD, [facility]   GS/MedQ  DD:  08/07/2006  DT:  08/08/2006  Job #:  [id number]

## 2011-04-30 NOTE — Discharge Summary (Signed)
NAMECHARLESTON, VIERLING NO.:  [id number]   MEDICAL RECORD NO.:  [id number]          PATIENT TYPE:  INP   LOCATION:  3702                         FACILITY:  [facility]   PHYSICIAN:  Duke Salvia, MD, FACCDATE OF BIRTH:  [birth year]   DATE OF ADMISSION:  09/25/2006  DATE OF DISCHARGE:  09/27/2006                                 DISCHARGE SUMMARY   ALLERGIES:  1. CYMBALTA.  2. NEURONTIN.   PRINCIPAL DIAGNOSES:  1. Admitted with recurrent palpitations, dyspnea, diaphoresis.  2. Recent admission September 29 through September 19, 2006, for      tachyarrhythmia with re-look electrophysiology study, ESI-guided      mapping, attempted radiofrequency catheter ablation September 15, 2006.      ESI mapping shows severe atriopathy with multiple atrial foci.  3. The first radiofrequency catheter ablation was on August 22, 2006,      with ablation of right atrial cristal tachycardia.  4. Restarted THIS ADMISSION on flecainide (had been started last admission      September 29 through October 8, but not continued at discharge).  On      the day of discharge, October 16, heart rate was less than 100 x24      hours.  5. RE:  TACHYCARDIA:      a.     Atriopathic component - multiple foci shown by ESI.      b.     Autonomic component with beta blocker hypersensitivity to       position changes and stress.      c.     Metabolic component composed of hypomagnesemia and hypokalemia.  6. A 24-hour urine for catecholamines this admission, result pending.  7. This patient will follow up with Dr. Edsel Petrin for autonomic testing      and endocrine evaluation.  8. 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  Will retry low-dose Florinef.  9. Persistent chest wall tenderness.  Continue indomethacin/Protonix.   SECONDARY DIAGNOSES:  1. Cerebral palsy.  2. Chronic back pain.  3. History of bilateral hamstring and right heel cord release.  4.  History of multiple orthopedic surgeries.   BRIEF HISTORY:  Ms. Puder is an 30 year old female.  She is well-known to  our practice.  She was recently discharged from the hospital October 8  following a repeat electrophysiology study.  In September 2007 she had  undergone a right atrial cristal tachycardia ablation.  She had recurrent  palpitations, even though she was managed medically, and underwent repeat  mapping on September 15, 2006, which demonstrated atriopathy with multiple  atrial foci.  Medical therapy seemed to achieve sinus rhythm less than 100  beats per minute in any 24-hour period.  At discharge she went home on  diltiazem CD 120 mg daily, Inderal 60 mg daily, with a prescription for  flecainide 50 mg twice daily which was not to be used unless notified by Dr.  Graciela Husbands.   Ms. Kleinsasser and her mother called into the office quite a few times with  complaint of recurrent  palpitation since her discharge on October 8.  She  came the emergency room on October 14 with heart rates in the 130s.  She was  given IV Cardizem by the emergency department staff.  Cardiology was asked  to consult.  The patient and her mother adamantly confirmed they had been  taking the medications as directed and had not missed any doses.  They do  state that Ms. Mertens has been more active at home and perhaps this has  somehow exacerbated her tachycardia.  On admission to the emergency room Ms.  Hawn is very uncomfortable and the mother and daughter are both of the  opinion that observation as an inpatient would be in the patient's best  interest.  On this admission it is possible that reinstitution of  antiarrhythmic therapy with flecainide in addition to control of metabolic  imbalances will help this patient.   HOSPITAL COURSE:  The patient was admitted to [facility] October 14  with recurrent tachyarrhythmias which are very symptomatic and causing the  patient malaise, discomfort in the  chest, trouble breathing.  On admission  to the emergency room her heart rate was in the 130s.  She was seen the next  day by Dr. Sherryl Manges who outlined the basic tenets of the problem.  The  patient seems to have three main areas of treatment need:  1. Atriopathy which was clarified during the last electrophysiology study      by [facility].  2. An autonomic component.  The patient has a beta-blocker      hypersensitivity to changes in position and with stress.  3. A metabolic component.  The patient's magnesium level on admission was      1.2 despite persistent replenishment at home on a dose of 400 mg twice      daily.  The patient was given magnesium sulfate 4 g IV x2 doses and a      following 4-g bolus in the morning of October 16.  A 24-hour urine was      also started on October 15 to check for catecholamine.  Overall, with      careful consideration, Dr. Graciela Husbands considered that data were consistent      with POTS.  In addition to the tachyarrhythmia rate control medications      he will institute low-dose Florinef at discharge at 50 mcg daily.  He      will continue indomethacin and Protonix for chest wall tenderness,      increasing the Inderal dosage to 120 mg daily.  Dr. Graciela Husbands also      recommends elevating the head of the bed by 6 inches on blocks and also      the patient should increase the salt and water intake with her daily      diet.  The patient is to discharge October 16.  The patient and her      mother will get a call from Dr. Edsel Petrin Wednesday, October 17.  He      is a specialist at [facility] who will arrange for      autonomic testing and endocrine evaluation.  She will see Dr. Graciela Husbands      Friday, November 9 at 11 a.m.   MEDICATIONS AT DISCHARGE:  1. Cardizem 120 mg daily.  2. Indomethacin 25 mg one tablet in the morning, one tablet in the      evening.  3. Protonix 40 mg daily. 4.  Inderal LA 120 mg daily.  5. Flecainide 50 mg twice daily - a  new medication.  6. Florinef 50 mcg daily.  7. Singulair 10 mg daily.  8. Prozac 20 mg daily.  9. Potassium chloride 40 mEq three times daily.  10.Magnesium oxide 400 mg twice daily.  11.Ativan 0.5 mg every 12 hours as needed.   LABORATORY STUDIES PERTINENT TO THIS ADMISSION:  Serum magnesium on the day  of discharge, October 16, is 2.0.  The admission serum electrolytes, October  14:  Sodium 141, potassium 3.7, chloride 109, carbonate 26, glucose 96, BUN  is 10, creatinine 0.6, alkaline phosphatase 58, SGOT 22, SGPT is 26.  PTT on  admission August 14 is 30, the pro time August 14 is 14.1, INR 1.1.  Serum  magnesium on admission 1.2.  Parathyroid hormone which was taken October 5  at 15.4; the range is 14-72.  The calcium is 9.3, the range 8.4-10.5.  Blood  cultures were taken on August 12 and they were negative times x2.     ______________________________  Maple Mirza, PA    ______________________________  Duke Salvia, MD, [facility]    GM/MEDQ  D:  09/27/2006  T:  09/28/2006  Job:  [id number]   cc:   Edsel Petrin, [name]  [facility]

## 2011-04-30 NOTE — Assessment & Plan Note (Signed)
[facility]                                   ON-CALL NOTE   Caitlyn, [name]                      MRN:          [id number]  DATE:08/28/2006                            DOB:          08-Jan-1981    PRIMARY CARDIOLOGIST:  1. Caitlyn Salvia, MD, [facility].  2. Caitlyn Sidle, MD.   PRIMARY CARE PHYSICIAN:  [name] [facility].   DATE OF CALL:  August 28, 2006, at approximately 10:20.   SUPERVISING PHYSICIAN:  [name]. Caitlyn Som, MD, [facility].   [name] is a 30 year old white female who is well known to our service  for problems with SVT.  Her mother calls today stating that [name]'s  blood pressure is high and she is not feeling well.  She could not be  specific as to why the patient was not feeling well.  She states that she  had her check a blood pressure at a store when they were running around  doing several things in the community.  She stated that it was in the  180's/90's.   I asked [name] if she had checked her daughter's blood pressure since  that time with her home blood pressure cuff or has had somebody check it  manually or if she had checked her blood pressure prior to departing the  house this morning, she stated that she had not.  I reassured her that blood  pressure monitors in stores were not necessarily as accurate as manual  method or one that had been compared to and calibrated with the manual  method, such as her blood pressure cuff at home.  I asked her to recheck a  blood pressure and a pulse rate when she returned home or to go to the  nearest fire department and have them check it.  She asked me to call her  back in approximately 45 minutes.   I called her back at approximately 11:45 and she states that her blood  pressure check at the fire department was in the 130's/70's.  She stated  that her heart rate was also in the 130's and she continued not to feel  well.  However, she again could not  be specific as to how the patient did  not feel well.   Her mother assured me that she took her medication this morning and, given a  stable blood pressure and her slightly elevated heart rate, I gave her  permission to take 50 mg of Toprol.  I asked her to return home to allow her  daughter to rest and relax while this medication takes effect.  She stated  that she should be home in the next hour or two and she will be happy to  have her take another Toprol at that time and rest and relax, but they have  some more things to do before they return home.  I told her that I will call  her back at approximately 3 p.m. to see how she was doing.   I attempted twice to call back  at 3 p.m.; however, the patient's mother is  not answering her phone.  I will leave a message at the office for Dr.  Odessa [name] nurse to follow up with the patient.                                   Caitlyn Rued, PA-C                                Caitlyn Salvia, MD, [facility]   EW/MedQ  DD:  08/28/2006  DT:  08/29/2006  Job #:  [id number]

## 2011-04-30 NOTE — Discharge Summary (Signed)
[name], [name] NO.:  [id number]   MEDICAL RECORD NO.:  [id number]          PATIENT TYPE:  INP   LOCATION:  2035                         FACILITY:  [facility]   PHYSICIAN:  Tereso Newcomer, P.A.     DATE OF BIRTH:  September 20, 1981   DATE OF ADMISSION:  06/19/2006  DATE OF DISCHARGE:  06/23/2006                                 DISCHARGE SUMMARY   ADDENDUM   Total physician/PA time on this discharge was greater than 30 minutes.      Tereso Newcomer, P.A.     SW/MEDQ  D:  06/23/2006  T:  06/24/2006  Job:  [id number]

## 2011-04-30 NOTE — Consult Note (Signed)
[name], [name] NO.:  [id number]   MEDICAL RECORD NO.:  [id number]          PATIENT TYPE:  EMS   LOCATION:  MAJO                         FACILITY:  [facility]   PHYSICIAN:  [name], [name] LHCDATE OF BIRTH:  January 15, 1981   DATE OF CONSULTATION:  DATE OF DISCHARGE:                                   CONSULTATION   REQUESTING PHYSICIAN:  Trudi Ida. Denton Lank, MD.   PRIMARY CARE PHYSICIAN:  [name], MD.   REASON FOR CONSULTATION:  Supraventricular tachycardia.   HISTORY OF PRESENT ILLNESS:  [name] is a 30 year old woman with history  of cerebral palsy, who came to the emergency department with her mother  today complaining of a rapid heart rate and dizziness.  She was seen in the  [facility] initially and apparently noted to have a rapid narrow  complex regular rhythm up to 180 and 190 beats per minute.  I unfortunately  do not have any strips for review as these were apparently misplaced in the  emergency department; however, my understanding is that she was given  intravenous Adenosine initially 6 mg apparently with a pause and decrease in  her heart rate into the 140s and what looked to be sinus tachycardia.  She  was transported to the main emergency department at [facility] by  [facility] and apparently had to receive additional Adenosine with similar  effects.  We were asked to see her with heart rates in the 140s and sinus  tachycardia.   In reviewing the available information, she saw Gene Serpe in our clinic  back in March of this year and had been having episodes of tachycardia that  looked to be sinus in origin.  She was placed on Toprol-XL 50 mg daily with  a significant improvement in her heart rate and had an echocardiogram  obtained, which revealed normal left ventricular systolic function with an  ejection fraction of 55 to 60% and no regional wall motion abnormalities.  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   It was decided at that time that the patient would get a CT scan of the  chest with contrast to exclude possible thromboembolic disease, although she  did not follow up for this.  She was actually scheduled to be seen in our  office this coming Monday.   In speaking with her mother, [name] has been off of her Toprol-XL for  at least the last month and only recently placed on short-acting Metoprolol  this past Friday by her primary care Vaneta Hammontree.  She is on the short-acting  form, however, and has been taking it once a day in the morning.  While in  the emergency department, she was initially given intravenous Cardizem by  the emergency department staff with some decrease in heart rate; however, I  treated her with Lopressor up to a total of 15 mg achieving a decrease in  heart rate from the 140s down into the low 90s and sinus rhythm.  She  was  much more comfortable with slowing of her heart rate.  I do have one tracing  for review showing probable sinus tachycardia at 147 beats per minute.  There is a tracing from March showing sinus rhythm at 96 beats per minute,  and the P-wave morphology looks fairly similar, although not identical.  She  does have a rightward axis, which is not new.   ALLERGIES:  NEURONTIN.   MEDICATIONS AT HOME:  1.  Prozac 10 mg p.o. daily.  2.  Lidoderm patch.  3.  Recently started generic Toprol 25 mg once daily (short-acting      medicine).   Past medical history, social history, and family history have all been  reviewed previously.  I reviewed the consultation note from our office back  in March of this year.  There has been no other major interval history.   PHYSICAL EXAMINATION:  VITAL SIGNS:  Following intravenous Adenosine, the  patient's heart rate was down in the low 90s in sinus rhythms, blood  pressure 109/78, oxygen saturation in the high 90s on room  air.  GENERAL:  This is a 30 year old woman lying on the stretcher in no distress  admitting that she feels better with slowed heart rate.  NECK:  She has no elevated jugulovenous pressure, no bruits are apparent.  LUNGS:  Generally clear with somewhat diminished breath sounds on the right.  CARDIAC:  A regular rate and rhythm without obvious mid systolic click or  murmur.  There is no pericardial rub.  ABDOMEN:  Soft and nontender with normal bowel sounds.  EXTREMITIES:  Exhibit no significant pitting edema.   LABORATORY DATA:  BUN 11, creatinine 0.9, sodium 138, potassium 4.7,  platelets 149, hemoglobin 13.5 and WBCs 9.3.   IMPRESSION:  1.  Supraventricular tachycardia including both sinus tachycardia and      perhaps intermittent narrow complex tachycardia of other mechanism.  I      unfortunately do not have the strips that were obtained when the patient      was given Adenosine, although these are being looked for by the      emergency department staff.  It is possible that she has either an AV      nodal reentrant tachycardia or perhaps even an SA nodal reentrant      tachycardia.  She has been treated only recently with short-acting      Metoprolol once daily, which is unlikely to be adequate to suppress her      arrhythmias.  She seemed to have much better control on higher dose,      long-acting Toprol-XL previously but has been off this medicine for the      last month.  2.  History of cerebral palsy.   RECOMMENDATIONS:  1.  I assessed the situation with the patient and her mother.  They already      have a followup visit scheduled in our office this Monday.  I have asked      that she begin taking her present generic Lopressor 25 mg at two tablets      p.o. b.i.d. and can use an additional tablet p.r.n. for breakthrough      rapid palpitations.  This can be solidified to simpler dosing assuming     it is effective and can be reviewed when she follows up in clinic.  2.   Hopefully the strips can be located around the time the patient received      Adenosine  as this may be, in fact, diagnostic of the problem, and she      might benefit from an Electrophysiology consultation if there does look      to be a reentrant mechanism tachycardia, although adequately dosed      medical therapy may be sufficient.  3.  I am not certain at this point based on scale of information that is      necessary to proceed with a CT scan of the chest as was originally      recommended.  This can be reconsidered depending on her clinical      progress.  I do think a TSH would be reasonable and I am not certain      that this was performed through our office at this time.      [name], [name] [facility]  Electronically Signed     SGM/MEDQ  D:  06/11/2006  T:  06/11/2006  Job:  [id number]   cc:   Trudi Ida. Denton Lank, [name]  Fax: [phone]   [name], [name]  Fax: [phone]

## 2011-04-30 NOTE — Consult Note (Signed)
[name], BANFIELD NO.:  [id number]   MEDICAL RECORD NO.:  [id number]          PATIENT TYPE:  EMS   LOCATION:  MAJO                         FACILITY:  [facility]   PHYSICIAN:  Marrian Salvage. Freida Busman, MD     DATE OF BIRTH:  1981-01-19   DATE OF CONSULTATION:  DATE OF DISCHARGE:  08/06/2006                                   CONSULTATION   REQUESTING PHYSICIAN:  Doug Sou, [name], [facility] emergency  department.   The patient's cardiologist is Dr. Sherryl Manges and Dr. Diona Browner at [facility]  cardiology.   Primary care physician is Dr. Rudi Heap.   CHIEF COMPLAINT:  Some mild chest discomfort.   HISTORY OF PRESENT ILLNESS:  The patient is a 30 year old female with  cerebral palsy.  She has had recurrent problems with sinus tachycardia.  The  etiology is unclear.  There was some initial thought that it was related to  some of her medications but discontinuation did not help.  At this point in  time it is thought that it is mostly related to chronic pain and anxiety.   PLAN:  The patient has had chronic problems with chest pain over the last  few weeks.  They had called the [place] on-call person on August 16 to  report this discomfort.  The patient was then seen in the electrophysiology  office on August 16 by Dr. Graciela Husbands.  During that visit, she had heart rates  from 135-168 on orthostatics.  She has also had some problems with poor  appetite and decreased p.o. intake.  She has been treated with metoprolol  150 mg b.i.d. as well as Florinef.  During that visit, Dr. Graciela Husbands thought  about potentially scheduling for EP study in the future but it would need to  be done under general anesthesia.   Over the last 4 days the patient has had worsening chest pain, particularly  with any exertion or eating.  She has not had much shortness of breath.  However, p.o. intake remains poor.  This afternoon due to worsening chest  pain her mother called EMS.  They arrived to find the  patient in a narrow  complex tachycardia at about 140.  Her blood pressure was relatively stable.  They were concerned for SVT and  treated the patient with adenosine 6 mg  intravenously with no effect.  The patient was brought to the emergency  department.   Here [name] the patient was apparently in sinus tachycardia on  telemetry.  Her heart rate ranged between 110 and 140.  She was anxious.  Labs showed some mild hypokalemia but were otherwise normal.  I was called  to see the patient for further evaluation.   PAST MEDICAL HISTORY:  1. Cerebral palsy.  2. Chronic sinus tachycardia.  3. Likely autonomic dysfunction.  4. Multiple orthopedic surgeries.  5. Anxiety.  6. Depression.   ALLERGIES:  1. NEURONTIN.  2. CYMBALTA.   CURRENT MEDICATIONS:  1. Toprol XL 150 mg b.i.d.  2. Florinef 0.1 mg daily.  3. Ativan as needed.  4. Prozac daily.  SOCIAL HISTORY:  The patient lives with her mother.  She does not smoke,  drink or use drugs.   FAMILY HISTORY:  No history of cardiac arrhythmia.   REVIEW OF SYSTEMS:  The patient walks with a walker.  She had no vomiting.  No diarrhea.  No swelling in the legs.  Otherwise review of systems is  negative except as detailed above.   PHYSICAL EXAMINATION:  Pulse 124, blood pressure 125/72, respirations 22,  saturation 98%, temperature 97.8 degrees.  She was in no distress.  No rash.  JVP is flat.  LUNGS:  Clear to auscultation bilaterally.  HEART:  Was regular but tachycardiac with a normal S1-S2 and no murmur.  ABDOMEN:  Was soft, nontender.  She had no peripheral edema.  She seem mildly anxious.  She is chronically ill-appearing and has clear  evidence of cerebral palsy.   LABORATORIES:  EKG showed sinus tach at 110 with a mild axis deviation,  normal intervals.  ST and T-waves were basically normal and unchanged from  July 17, 2006.  Laboratories today showed a sodium of 141, potassium 3.4,  chloride 109, bicarb of 24, BUN  11, glucose 101, hematocrit 34.   IMPRESSION:  30 year old female with cerebral palsy who has chronic sinus  tachycardia.  I agree that it is somewhat atypical but she does have chronic  anxiety and significant pain.  She has been seen by Dr. Graciela Husbands and  electrophysiology.  She has had multiple presentations for medical  assistance given symptoms related to her sinus tachycardia and chest  discomfort.   PLAN:  1. The patient will be discharged from the emergency department.  She does      have sinus tachycardia and I think her risk of having significant      problem for this the short term is small.  2. The patient was given some morphine in the emergency department.  She      is to continue to have treatment for her pain and anxiety.  3. I spoke to the patient and the mother about encouraging p.o. intake.      It is likely that she is also somewhat dehydrated given the heat and      poor p.o. intake.  4. The patient will continue on her Florinef as well as her Toprol XL at 3      mg daily.  5. The patient already has an appointment scheduled with Dr. Graciela Husbands on this      coming Friday.  She will continue with that.           ______________________________  Marrian Salvage. Freida Busman, MD    LAA/MEDQ  D:  08/07/2006  T:  08/08/2006  Job:  [id number]

## 2011-04-30 NOTE — Assessment & Plan Note (Signed)
[facility]                                   ON-CALL NOTE   LADAN, VANDERZANDEN                        MRN:          [id number]  DATE:07/23/2006                            DOB:          1981/01/14    PRIMARY CARDIOLOGIST:  Dr. Diona Browner.   SUPERVISING CARDIOLOGIST:  Dr. Myrtis Ser.   PRIMARY CARE PHYSICIAN:  Dr. Rudi Heap.   EP PHYSICIAN:  Dr. [name].   REHAB PHYSICIAN:  Dr. Ethelene Hal.   NEUROLOGIST:  Dr. Nash Shearer.   HISTORY:  [name] is a 30 year old white female who is very well-known  to our practice.  She has had recurring problems with sinus tachycardia.  It  was initially felt to be related to Cymbalta; however, this medication has  been discontinued and she continues to have problems with sinus tachycardia  that is related to anxiety and chronic pain.   Her mother calls today July 23, 2006 stating that her daughter has  continued to complain of palpitations since 5 o'clock this morning.  According to her mother, she states that Terree has taken her prescribed  medications this morning which include Toprol XL 150 mg, Singulair.  She did  not place her Lidoderm patch on her and she did take an Ativan this morning.  Throughout the morning, patient has complained of shortness of breath and  palpitations; however, her mother has not checked her blood pressure or  pulse.  According to the mother, the patient stated she took a Metoprolol 50  mg at approximately 11 o'clock because she was feeling funny, again her  vital signs were not taken.  Around 1 o'clock the mother states that she was  still complaining of these things but they went out and did various errands.  She took Lenyx to an urgent care clinic over at [facility] and she was  told that her blood pressure was 141/80 and her heart rate was 153; however,  the mother left this facility before the patient could be evaluated because  she was not happy.  She thought about stopping at  the fire station to have  her heart rate confirmed but did not do so.  She went home and called our  office for further evaluation.   At this time, Ms. Laforest's mother has attempted to check her blood pressure  and pulse, she had gotten various readings with her home system.  She states  that her home system is inaccurate and she has not bothered to bring in her  blood pressure cuff to have it compared for accuracy.  However, when she has  checked her blood pressure and pulse several times using this device while  on the phone, her blood pressure has been erroneous but her heart rate has  been between the 110-120 which is probably accurate.  During this  conversation, I can hear Dariah speaking loudly and quickly in the  background.  It is difficult to hear what she is saying.  Her mother appears  to be very frustrated with these events.  I suggested that she take  a half  of Metoprolol 25 mg now, put her Lidoderm patch on as she has been  instructed to do so in the past and that she may take another Ativan and  rest.  I have asked her to keep her appointment with the EP physician on  Thursday, to bring in all her medications as well as her blood pressure cuff  to test for accuracy.  Her mother adds that she is supposed to see her pain  medicine doctor tomorrow.  I suggested that she inform her pain medicine  physician the problems with the Lidoderm patch as that it melts when she  goes outside and he may offer additional alternatives.  She states that she  will try these measures mentioned above.                                   [name], PA-C   EW/MedQ  DD:  07/23/2006  DT:  07/24/2006  Job #:  [id number]   cc:   [name], MD  Luis Abed, MD, [facility]  [name], MD  Doylene Canning. [name], MD  Richard D. Ethelene Hal, MD  Bevelyn Buckles. Nash Shearer, MD

## 2011-04-30 NOTE — Op Note (Signed)
[name], MUTCHLER NO.:  [id number]   MEDICAL RECORD NO.:  [id number]          PATIENT TYPE:  EMS   LOCATION:  URG                          FACILITY:  [facility]   PHYSICIAN:  Duke Salvia, MD, FACCDATE OF BIRTH:  [birth year]   DATE OF PROCEDURE:  08/22/2006  DATE OF DISCHARGE:  08/17/2006                                 OPERATIVE REPORT   PREOPERATIVE DIAGNOSIS:  Inappropriate sinus tachycardia.   POSTOPERATIVE DIAGNOSIS:  Right crystal tachycardia.   PROCEDURE:  Invasive electrophysiological study, Isoproterenol infusion  arrhythmia mapping and radiofrequency catheter ablation.   Upon the obtaining of informed consent the patient was brought to the  electrophysiology laboratory and placed on the fluoroscopic table in the  supine position.  She was placed under general anesthesia under the care of  doctors Kasik and Gelene Mink.  After routine prep and drape cardiac  catheterization was performed.  Following the procedure the catheter was  removed, hemostasis was obtained and the patient was transferred to the  recovery room in stable condition.   Catheter was a 5-French quadripolar catheter that was inserted via the right  femoral vein to the AV junction for his electrogram.  An 8-French 5 mm deflectable tip ablation catheter was inserted via the  right femoral vein to mapping site of the right atrium and pacing sites of  the right ventricle and the right atrium.  A 10-French ESI array catheter was inserted into the left femoral vein to  the right atrium.  Surface leads 1, AVF and V1 were monitored continuously throughout the  procedure.  Following insertion of the catheter the stimulation protocol  included the incremental atrial pacing.  Single atrial extra stimuli.   RESULTS:  Surface electrocardiogram:  Initial:  Rhythm:  Sinus; R interval: 629 milliseconds; PR interval: 127 milliseconds;  QRS duration 89 milliseconds; QT interval:  352 milliseconds;  P wave  duration 80 milliseconds; bundle branch block absent;  preexcitation absent.  H Interval:  54 milliseconds;  HV 30 milliseconds.  Final:  Sinus; R interval 691 milliseconds; PR interval:  117 milliseconds;  QRS duration 112 milliseconds; QT interval 455 milliseconds; P wave duration  N/M; H interval: 80 milliseconds; HV interval 37 milliseconds.   Atrial tachycardia was induced with Isoproterenol infusion as well as  awakening her from modest general anesthesia.  Using the [facility] mapping array,  we were able to identify sinus which was more septal and atrial tachycardia  focus which was more lateral by the crystal terminalis.  Early activation  was seen for rates 113 to 140 in this same spot.  The electrocardiogram of  this was nearly indistinguishable.   AV Neurofunction:  AV Wenckebach was 350 milliseconds.  AV neuroceptor refractory at 500 milliseconds was 210 milliseconds and AV  neural conduction was continuous.   No evidence of accessory path was identified.   Arrhythmias induced:  The atrial tachycardia previously identified was induced with Isoproterenol  and with reduction of general anesthesia.  This was mapped to the high  crystal terminalis.   Radiofrequency energy was directed at the early activation site.  All RF  lesions were preceded by high voltage pacing.  Unfortunately the systemic  early activation site was associated with diaphragmatic stimulation as was  the region somewhat cephalad to that.  We thus approached it in a variety of  ways, first medially, then laterally, superiorly as well as inferiorly to  encircle the area from which the tachycardia was originating.  We were  actually able to almost entirely blanket the area in question.  A total of  21 lesions of RF was applied for a total of 9 minutes and 41 seconds.   Isoproterenol following the ablation failed to change the heart rate from  the mid 80s.   At this point the procedure was terminated.   The catheters were removed.  Hemostasis was assured and the patient was then awakened.  She was  transferred to the recovery room in stable condition.   Fluoroscopy time was a total of 7 minutes and 46 seconds utilized at 7 1/2  frames per second.   IMPRESSION:  1. Normal sinus function.  2. Abnormal atrial function manifested by high right crystal atrial      tachycardia.  3. Normal AV nodal function.  4. Normal His-Purkinje system function.  5. Normal accessory pathway.  6. Normal ventricular response to program stimulation.   SUMMARY:  In conclusion, results of electrophysiological testing identified  a high right atrial crystal tachycardia as cause of the patient's clinical  arrhythmia.  Radiofrequency energy was directed at this with some  difficulties because the early activation site overlay the phrenic nerve and  stimulation from this site was associated with diaphragmatic stimulation.  Because of this, we had to encircle the area as opposed to pinpointing it  directly.  We were able to encroach upon it quite closely, however, based on  our ablation map.  Because of that difficulty; however, the internal aspect  of this encircled area was not targeted.   Electrocardiography demonstrated a significantly changed P wave vector with  a now biphasic P wave in the inferior leads consistent with the activation  that we saw.   The patient tolerated the procedure without apparent complication.  She is  on her way to the recovery room and we will need to monitor her heart rate  hereafter and see how it is that she does.           ______________________________  Duke Salvia, MD, [facility]     SCK/MEDQ  D:  08/22/2006  T:  08/23/2006  Job:  [id number]

## 2011-04-30 NOTE — H&P (Signed)
NAMEMAILEE, KLAAS NO.:  [id number]   MEDICAL RECORD NO.:  [id number]          PATIENT TYPE:  EMS   LOCATION:  MAJO                         FACILITY:  [facility]   PHYSICIAN:  Olga Millers, [name] LHCDATE OF BIRTH:  [birth year]   DATE OF ADMISSION:  06/19/2006  DATE OF DISCHARGE:                                HISTORY & PHYSICAL   PRIMARY CARDIOLOGIST:  [name], MD.   PRIMARY ELECTROPHYSIOLOGIST:  Doylene Canning. [name], MD.   REASON FOR ADMISSION:  Ms. Mullan is an unfortunate 30 year old female,  with cerebral palsy, who now returns to the emergency room for the third  time since June 30th.  She was initially seen here on June 30th and treated  for apparent SVT with Adenosine (total 18 mg), Diltiazem, and IV Lopressor.  Following improvement in her heart rate, she was discharged home.  However,  on a scheduled followup office visit on July 2nd, she had a recurrent  tachycardia and was referred back to the hospital where she was seen in  consultation by Dr. [name] and discharged on Toprol and Digoxin two  days later, July 4th.  Dr. [name] did suggest that her rhythm might  represent a probable atrial tachycardia versus sinus node reentrant  tachycardia.  He also reviewed serial EKGs, which were notable for sinus  tachycardia with long-RP tachycardia.  He did discuss the possibility of  radiofrequency catheter ablation but noted that the patient and her mother  opted for medical therapy at that time.   She now returns to the ER with the complaint of recurrent tachycardia with  rates anywhere in the 130-145 range (as reported by her mother).  The  patient herself states that she did well the day of discharge (July 4th),  but noted recurrent tachycardia the following day.  She does have some mild  associated dyspnea with this.   The patient was instructed by our team to increase her Toprol yesterday to a  total of 150 daily.  However, she actually took  200 mg of Toprol yesterday  and 250 mg early this morning, as well as her dose of Digoxin.   An EKG on arrival here in the ER shows sinus tachycardia at 158, again with  a short P-R interval and right axis deviation.   ALLERGIES:  NEURONTIN.   CURRENT HOME MEDICATIONS:  1.  Toprol-XL 100 mg daily.  2.  Digoxin 0.125 mg daily.  3.  Cymbalta 60 mg daily.  4.  Singulair 10 mg daily.  5.  Lidoderm patch as needed.   PAST MEDICAL HISTORY:  Cerebral palsy, status post hamstring and heel  surgery in approximately 1991.   SOCIAL HISTORY:  The patient is an only child and lives with her mother.  She denies any history of tobacco or alcohol use.   FAMILY HISTORY:  Mother, age 56, with no known heart disease.  Patient has a  half-brother, age 27, with no known heart disease.  She has no sisters.   REVIEW OF SYSTEMS:  Extremely limited given the patient's difficulty in  articulating.  The patient  ambulates with the use of a walker.  There have  been no frank syncopal episodes since her recent discharge.   PHYSICAL EXAMINATION:  VITAL SIGNS:  Blood pressure 137/90, pulse 170s,  initially, respirations 22, temperature 97.0, SaO2 99% on room air.  GENERAL:  A 30 year old female, sitting upright, agitated but in no apparent  distress.  HEENT:  Normocephalic, atraumatic.  NECK:  Palpable carotid pulses without bruits.  LUNGS:  Clear to auscultation in all fields.  HEART:  Regular rhythm with an increased rate (S1, S2), no appreciable  murmur.  ABDOMEN:  Soft, nontender.  EXTREMITIES:  Palpable pulses without edema.  NEURO:  Generalized chorea-like movement of the upper and lower extremities;  mildly garbled speech but otherwise alert and oriented.   LABORATORY DATA:  Pending.  The patient did have a TSH of 2.14 on July 2nd.   IMPRESSION:  1.  Recurrent tachycardia.      1.  Question atrial tachycardia versus a sinus node reentrant          tachycardia.      2.  Associated mild  dyspnea.  2.  Normal left ventricular function.      1.  Echocardiogram, March of 2007.  3.  Cerebral palsy.   PLAN:  The patient will be readmitted to a step-down unit for close  monitoring of her heart rate.  She was treated with 6 mg of IV Adenosine  here in the emergency room, which resulted in some transient slowing, but  with a prompt return of her heart rate to the 150-160 range.  At this point,  we will treat her with a 5 mg bolus of Diltiazem and then place her on a  drip at 5 ml an hour.  We will not try to treat her too aggressively given  that she might develop relative hypotension secondary to a significant dose  of Toprol taken earlier today.  We will try to maintain her heart rate in  the 130 range or below, if possible.   The plan is to have Dr. [name] see the patient tomorrow and revisit  the possibility of proceeding with radiofrequency catheter ablation, to  which both the patient and her mother are now agreeable.      Gene Serpe, [name]    ______________________________  Olga Millers, [name] [facility]    GS/MEDQ  D:  06/19/2006  T:  06/19/2006  Job:  [id number]

## 2011-04-30 NOTE — Assessment & Plan Note (Signed)
[facility]                                   ON-CALL NOTE   [name], [name]                      MRN:          [id number]  DATE:06/18/2006                            DOB:          February 13, 1981    PRIMARY CARDIOLOGIST:  Dr. [name].   DATE OF TELEPHONE CONTACT:  June 18, 2006   [name] is a 30 year old female with a history of cerebral palsy and  sinus tachycardia, as well as supraventricular tachycardia.  She was  discharged on the 4th of July.  Her mother called because she had taken  [name] to [place] and at [place], she began having problems with  palpitations.  [name] stated that she did not feel very well and her mother  was asking what she should do.   I contacted Dr. Sherryl Manges, who was the on-call physician.  He knows Ms.  Weesner and felt that the best option for her was to continue taking her  medications as prescribed and to be seen by Dr. [name] on Monday.  He  felt that she was not at high risk for adverse events, as long as she  continued taking her medications as prescribed.  [name] was advised  that if her daughter's symptoms worsened, she was to bring her directly to  the emergency room and otherwise could wait and see Dr. [name] on Monday.  Dr. Graciela Husbands also advised that [name] should increase her Toprol from 100  mg p.o. b.i.d. to 150 mg p.o. b.i.d.  She is to continue on the digoxin as  scheduled.  [name] is to continue on her other home medications.                                   Theodore Demark, PA-C                                Duke Salvia, MD, [facility]   RB/MedQ  DD:  06/22/2006  DT:  06/23/2006  Job #:  [id number]

## 2011-04-30 NOTE — Consult Note (Signed)
[name], [name] NO.:  [id number]   MEDICAL RECORD NO.:  [id number]          PATIENT TYPE:  INP   LOCATION:  2013                         FACILITY:  [facility]   PHYSICIAN:  Doylene Canning. [name], [name]  DATE OF BIRTH:  1981-10-27   DATE OF CONSULTATION:  06/13/2006  DATE OF DISCHARGE:                                   CONSULTATION   REFERRING PHYSICIAN:  Consultation is requested by Dr. Simona Huh   INDICATIONS:  Evaluation of symptomatic SVT.   PRESENT ILLNESS:  The patient is a 30 year old woman with cerebral palsy who  does have approximately 3-57-month history of tachy palpitations.  She has  had documented SVT at rates up to as high as 160-170 beats per minute but  typically in the 130-150 range.  The patient was seen in the emergency room  on Saturday with SVT and watched and given beta blockers and calcium channel  blockers.  She eventually slowed down.  A 2-D echo was done which  demonstrated normal LV systolic function.  She apparently had her Toprol  stopped about a month ago and was placed on metoprolol and has not done well  with this medication.  Her mother states that heart rate is always increased  but typically in the 100-120 range.  She feels anxious and weak and dizzy.  The patient was seen in Urgent Care where she was reported to have heart  rate 180 range and was treated with intravenous adenosine with no change in  the symptoms.  Ultimately she was given Cardizem and Lopressor with  improvement in symptoms and reduction in heart rate.  She is referred for  additional evaluation.  Her EKG demonstrates what appears to be a long RP  tachycardia with positive P-waves in the inferior leads.   PAST MEDICAL HISTORY:  Is notable for cerebral palsy although her intellect  appears to be fairly normal.  She is limited by her fine motor skills in her  hands.  She is ambulatory and walks with a walker with her mother.   SOCIAL HISTORY:  The patient lives  with her mother.  She does not work.  She  denies alcohol or tobacco use.   FAMILY HISTORY:  Notable for mother without coronary disease.   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 She also has dizziness and  lightheadedness with her palpitations and anxiousness and diaphoresis.   PHYSICAL EXAMINATION:  GENERAL:  She is a pleasant, young-appearing woman in  no acute distress with clear cerebral palsy.  VITAL SIGNS:  The blood pressure 127/88, pulse was 100 and regular,  respirations were 20, temperature 98.  HEENT:  Normocephalic, atraumatic.  Pupils are round.  Oropharynx is moist.  Sclerae anicteric.  NECK:  Revealed no jugular distension.  There is no thyromegaly.  Trachea is  midline.  Carotids are 2+ and symmetric.  LUNGS:  Clear bilaterally to auscultation.  No wheezes, rales or rhonchi.  HEART:  Regular rate and rhythm with normal S1-S2.  There  is a soft S4  gallop present.  LUNGS:  Clear bilaterally auscultation.  No wheezes, rales or rhonchi.  There is no increased work of breathing.  ABDOMEN:  Soft, nontender distended, no organomegaly.  Bowel sounds present  and no rebound or guarding.  EXTREMITIES:  Demonstrated no cyanosis, clubbing or edema.  Pulses were 2+  and symmetric.  NEUROLOGIC:  Alert and oriented x3 with cranial nerves intact.  Strength was  5/5 and symmetric.   The EKG demonstrates sinus tachycardia other EKG demonstrated what appeared  to be atrial tachycardia although I cannot be certain as some demonstrate no  clear-cut P-wave, most do demonstrate a long RP tachycardia with positive P-  waves in the inferior leads.   IMPRESSION:  1.  Probable atrial tachycardia versus sinus node reentry tachycardia.  2.  History of dizziness secondary to #1.  3.  Cerebral palsy.   DISCUSSION:  I discussed the treatment options with the patient and her  mother.  I have offered them  catheter ablation but for now they would like  to continue with medical therapy if at all possible.  We will have them go  back to Toprol and add digoxin and keep her in the hospital on monitor for  approximately 2 days to make sure that her heart rate will be under decent  control.           ______________________________  Doylene Canning. [name], [name]     GWT/MEDQ  D:  06/13/2006  T:  06/13/2006  Job:  [id number]   cc:   [name], [name]  Fax: [phone]

## 2011-04-30 NOTE — Op Note (Signed)
NAMECHRISTIN, MOLINE NO.:  [id number]   MEDICAL RECORD NO.:  [id number]          PATIENT TYPE:  INP   LOCATION:  2035                         FACILITY:  [facility]   PHYSICIAN:  Doylene Canning. [name], [name]  DATE OF BIRTH:  1981-08-18   DATE OF PROCEDURE:  DATE OF DISCHARGE:                                 OPERATIVE REPORT   DATE OF PROCEDURE:  06/21/2006   PROCEDURE:  Head-up tilt table testing.   INDICATIONS:  History of syncope (unexplained with questionable autonomic  dysfunction).   HISTORY OF PRESENT ILLNESS:  The patient is a very pleasant, 30 year old  young lady with a history of cerebral palsy.  Approximately 3 weeks ago she  began to experience episodes of tachy compensations and shortness of breath.  She essentially was found to have what appeared to be sinus tachycardia.  The patient has been treated initially with Toprol and Digoxin, and re-  presented to the hospital with the current symptomatic heart rate of up to  180 beats per minute.  Again, the mechanism appeared to be sinus  tachycardia.  The patient has become increasingly symptomatic with these  spells.  They seem to be worse with emotional stress or stressful activity.  When her heart races she feels short of breath.  She is now referred for  having a tilt tablet test and is in the hospital, she has been found to have  heart rates that have increased from a baseline of 70 or 80 up to 180 beats  per minute, despite being supine and despite being on high dose beta  blockers.   DESCRIPTION OF PROCEDURE:  After informed consent was obtained, the patient  was taken to the diagnostic EP lab in a fasting state.  After usual  preparations she was placed in the supine position where her initial blood  pressure was 110/70 and her heart rate was 130 beats per minute.  She was  placed initially in the 15 degrees head-up tilt table position and her heart  rate increased to 150.  Her blood pressure remained  the same.  She was 5  minutes later increased to the 30 degrees head-up tilt table position and  her heart rate increased to the 155 range with still no significant change  in blood pressure.  After 5 minutes her head was increased to 45 degrees in  the tilting position and her heart rate increased to 160 with blood pressure  that remained in the 110-120 range.  Finally she was placed in the 70  degrees head-up tilt table position.  Her heart rate increased further to  the 170-180 beats per minute range and her blood pressure was somewhat  variable with a high of 135 and a low of 90 systolic.  Despite this she felt  fatigued and weak, but did not actually pass out.  After 30 minutes in this  position she was placed back in the supine position where her heart rate  immediately decreased to the 140 beats per minute range without any  concomitant change in her blood pressure, ranging in the 110-120  range.  She  was subsequently returned to her room in satisfactory condition.   COMPLICATIONS:  There were no immediate procedure complications.   RESULTS:  This head-up tilt table test demonstrates no evidence of inducible  syncope.  However, the patient does have evidence of fairly profound  autonomic dysfunction as manifested by her increased heart rate with head-up  tilting.  Of note, the heart rate remained in the 175 range throughout the  time that she was in the 70 degree head-up tilt table position.  The  mechanism of this is unclear, though it is presumed that there is at least a  component of inappropriate or decreased venous return (orthostasis) and  perhaps a component of increased norepinephrine from  her ongoing use of Cymbalta.  Treatment plans will initially be intravenous  normal saline fluid hydration, the addition of Florinef, the initiation of  Clonidine, continuation of her beta blocker and digoxin, and a period of  watchful waiting with frequent orthostatic blood pressure  checks.           ______________________________  Doylene Canning. [name], [name]     GWT/MEDQ  D:  06/21/2006  T:  06/22/2006  Job:  [id number]   cc:   [name], [name]  Fax: [phone]   [name], [name] [facility]  [address]  [place]  [place] [address]

## 2011-04-30 NOTE — Assessment & Plan Note (Signed)
[facility] HEALTHCARE                                 ON-CALL NOTE   [name], [name]                        MRN:          [id number]  DATE:11/12/2006                            DOB:          06-04-1981    The patient's mother called today concerned that the patient was having  more shortness of breath and anxiety.  She had also been having cold-  like symptoms and a runny nose with allergies.  The patient's mother  called [facility] physician secondary to these symptoms and the  physician was in a procedure and had not called her back yet, so she  called [place] for followup.  The patient has no new symptoms.  The mother  had been noticing that the patient continued her shortness of breath and  anxiety with some runny nose and cough.  The patient's mom wanted to let  us know about this.  She did not feel like it was necessary to bring her  to the emergency room at this time.  She is not febrile.  Her blood  pressure has been well maintained according to her mother, and she has  been seen by her primary care physician within the last 24 hours.   I reassured the mother that if she felt that the patient was becoming  worse or her symptoms increased or if her anxiety level caused her to be  very short of breath or hypoventilating causing new symptoms, she is to  call us back or to bring her to the emergency room.  The mother states  that she was going to wait on the [facility] physician to call her back and  see if there is anything more they needed to do.  She states that the  [facility] physician increased her Toprol to 100 mg once a day and she has  been on it over the last several days.  The mother basically just wanted  to report this information to [place] and states that she would call us back  if the symptoms worsened, but would prefer to speak to her [facility]  physician and is waiting on a phone call back.     Bettey Mare. Lyman Bishop, NP  Electronically Signed    KML/MedQ   DD: 11/12/2006  DT: 11/13/2006  Job #: [id number]

## 2011-04-30 NOTE — H&P (Signed)
[name], [name] NO.:  [id number]   MEDICAL RECORD NO.:  [id number]          PATIENT TYPE:  INP   LOCATION:  4738                         FACILITY:  [facility]   PHYSICIAN:  [name], MD DATE OF BIRTH:  August 06, 1981   DATE OF ADMISSION:  09/08/2006  DATE OF DISCHARGE:                                HISTORY & PHYSICAL   ALLERGIES:  CYMBALTA AND NEURONTIN.   CARDIOLOGIST:  [name], MD.   ELECTROPHYSIOLOGIST:  Duke Salvia, MD, [facility]   This is [name] dictating under authorization from Dr. Nona Dell, who has seen and examined the patient, explained to the family and  to me the plan of action.   PRESENTING CIRCUMSTANCE:  I can't sleep, I can't eat, I am very tired.   HISTORY OF PRESENT ILLNESS:  Ms. Kees is a 30 year old female with a  history of symptomatic atrial tachy arrhythmias.  She underwent  electrophysiology study with [facility] mapping/radiofrequency catheter ablation of  a right atrial cristal tachycardia on August 22, 2006.  The study was  done under general anesthesia.  The tachycardia was reduced from 150 beats  per minute to a sinus tachy in the range of 90-100 beats per minute.  Post-  procedure, the patient was hypotensive and had nausea.  These were resolved  by discharge on August 24, 2006.  Also, her heart rate had decreased from  tachycardic range in the low-100s to 85 beats per minute no discharge.  Because of hypotension, her Toprol-XL dose was decreased to 50 mg daily.  Prior to the tachycardia ablation, she was taking Toprol 150 mg twice daily.   The patient has had recurrence of high rates as early as 48 hours after the  discharge from [name] on September 12.  She presented in tachycardia to  the emergency room Sunday, September 16.  She also had office visit with Dr.  Graciela Husbands on September 20.  At the office visit, her Toprol was increased from  50 mg daily to 50 mg twice daily.  Unfortunately,  her tachycardia has been  incessant and has been incessant since then.  She was at the emergency room  once more on September 23 and had a workup but once again was sent home.  The patient says that she cannot sleep, she has no appetite and that she is  constantly fatigued.  At the office visit on September 20, a re-look study  with [facility] under general anesthesia was proposed for September 21, 2006 here at  [facility].   However, the patient needs relief of symptoms and we will admit the patient  with IV Cardizem and after discussion with Dr. Nona Dell, he has  recommended antiarrhythmic.  This suggestion has been run by Dr. Graciela Husbands who  agreed with Dr. Diona Browner and she will be started on flecainide 50 mg b.i.d.  Of note, the patient had an echocardiogram March 207.  Ejection fraction was  55-60%.  No left ventricular wall motion abnormalities.  There was equivocal  mitral valve prolapse with systolic mitral valve bowing.   MEDICATIONS  ON ADMISSION:  1. Toprol-XL 50 mg p.o. b.i.d.  These were placed on hold for this      admission.  2. Prozac 20 mg daily.  3. Singulair 10 mg daily.  4. Magnesium oxide 400 mg twice daily.  5. Doxycycline 100 mg twice daily.   SOCIAL HISTORY:  The patient lives with her mother.  She has a history of  cerebral palsy.   REVIEW OF SYSTEMS:  The patient does not have any fevers, chills, night  sweats, weight change.  She is not having any nasal discharge, epistaxis,  hoarseness or vertigo.  INTEGUMENT:  No rashes or nonhealing ulcerations.  CARDIOPULMONARY:  No chest pain.  She is short of breath with this  tachycardia.  She is fatigued with the tachycardia, anorexic with the  tachycardia.  She definitely feels the palpations involved with her heart  rate going at 150 beats a minute.  She is not having any dizziness or  syncope.  UROGENITAL:  No urinary tract problems.  NEUROPSYCHIATRIC:  The patient does get anxious when these tachyarrhythmias  beset  her especially at nighttime.  GASTROINTESTINAL:  No complaints of abdominal pain or reflux or bright red  blood per rectum.  ENDOCRINE:  No history of diabetes or thyroid disease.  MUSCULOSKELETAL:  The patient has joint deformity consistent with cerebral  palsy.   Other systems negative.   PHYSICAL EXAM:  Temperature 98.2, blood pressure 124/82, heart rate is 146  and regular, respirations 26, oxygen saturation 100% on room air.  She is  alert and oriented x3.  Mild dyspnea with malaise.  HEENT:  Normocephalic, atraumatic.  Eyes:  Pupils equal, round, reactive to  light.  Extraocular movements are intact.  Sclerae are clear.  NECK:  Supple.  No carotid bruits auscultated.  No jugular venous  distension.  No cervical lymphadenopathy.  HEART:  Regular rate and rhythm without murmur.  LUNGS:  Clear to auscultation and percussion bilaterally.  ABDOMEN:  Soft, nondistended, bowel sounds are present, no rebound or  guarding.  EXTREMITIES:  Show no evidence of clubbing, cyanosis or edema.  There are no  rashes, lesions or petechiae.  MUSCULOSKELETAL:  Joint deformity consistent with her cerebral palsy.  No  effusions, kyphosis.  NEUROLOGIC:  Alert and oriented x3.  Cranial nerves II-XII are spastic.   Chest x-ray September 24:  Lungs are clear bilaterally.  Heart size is  normal.  Electrocardiogram shows a rapid sinus tachycardia-like rhythm.  Heart rate 140-150.   Laboratory studies were drawn September 24.  A complete blood count white  cells 6.3, hemoglobin 12.1, hematocrit 35.1, platelets 193.  Serum  electrolytes on September 24 139 sodium potassium 3.5, chloride 105, carbon  dioxide 27, BUN 9, creatinine 0.7, glucose 105.  Her D-dimer is 3.14.  On  September 23 at 1303, myoglobin was 57.6, CK-MB was 1.9, troponin I study  was less than 0.05.   IMPRESSION AND ASSESSMENT: 1. Atrial tachy arrhythmias with symptoms of fatigue, anorexia and trouble      sleeping and dyspnea.  2.  Long history of atrial tachycardias.      a.     Status post [facility] mapping/electrophysiology study/radiofrequency       catheter ablation of the right atrial cristal tachycardia August 22, 2006.      b.     Recurrence of atrial tachy arrhythmia within 48 hours of       discharge August 24, 2006.  3. Cerebral  palsy.  4. Multiple orthopedic procedures.  5. Anxiety and depression.   PLAN:  1. Admit to telemetry.  2. To start IV Cardizem with bolus and drip.  3. As suggested by Dr. Nona Dell, start flecainide 50 mg p.o. b.i.d.     ______________________________  Caitlyn Mirza, PA      [name], MD  Electronically Signed    GM/MEDQ  D:  09/08/2006  T:  09/09/2006  Job:  [id number]

## 2011-04-30 NOTE — Assessment & Plan Note (Signed)
[facility] HEALTHCARE                                   ON-CALL NOTE   LAVONN, MAXCY                      MRN:          [id number]  DATE:07/17/2005                            DOB:          01-Nov-1981    PRIMARY CARDIOLOGIST:  [name], MD   PROBLEMS:  I was contacted this afternoon from the [facility] home with a  message regarding the patient, [name], who is having recurrent elevated heart  rate with associated shortness of breath.   However, when I answered the page, I was informed by the patient's  grandmother that the patient was already in route to the emergency room for  further evaluation.  I informed her that we would be awaiting her arrival.                                   Rozell Searing, PA-C   GS/MedQ  DD:  07/17/2006  DT:  07/18/2006  Job #:  [id number]

## 2011-04-30 NOTE — Assessment & Plan Note (Signed)
[facility] HEALTHCARE                           ELECTROPHYSIOLOGY OFFICE NOTE   [name], [name]                      MRN:          [id number]  DATE:07/28/2006                            DOB:          1981/09/29    [name] is seen.  She continues to have this problem with tachycardia.  It is not clear to me the mechanism of it, though I have been impressed that  there is an autonomic component to it, although the primary tachycardia may  not be autonomic.  Her heart rates today on orthostatics ranged from 135-  168.  It is really very troubling for her.  It is making it difficulty for  her to eat and continued problems with shortness of breath.  She is on  Florinef 0.1 mg and metoprolol 150 mg b.i.d.   I told her that I would talk with Georgana Curio about this, but I think it may  be time that we have time that we have to think about doing an EP study  using an ESIRA, but this will need to be undertaken with general anesthesia,  given her cerebral palsy.   I need to see her again in about 4 weeks' time.                                   [facility]uke Salvia, MD, [facility]   SCK/MedQ  DD:  07/28/2006  DT:  07/29/2006  Job #:  [id number]

## 2011-04-30 NOTE — Assessment & Plan Note (Signed)
[facility] HEALTHCARE                                   ON-CALL NOTE   DARNITA, WOODRUM                      MRN:          [id number]  DATE:07/08/2006                            DOB:          01-Mar-1981    TELEPHONE ON-CALL NOTE   PRIMARY CARDIOLOGIST:  Dr. Simona Huh.   PROBLEM:  Ms. Zorn mother, Babette Relic, contacted me this evening, concerned  that her daughter, Dayla, well known to our service, has been having  persistent sinus tachycardia with rates as high as 150-160 earlier this  afternoon.  She states that they were seen in the office today by Dr. Christell Constant,  who contacted Dr. Daleen Squibb, who in turn recommended that the current dose of  Toprol (100 mg b.i.d.) be increased to 150 mg b.i.d.   Her mother, however, has been reluctant to do so, and is currently about to  give her the p.m. dose.  She states that her daughter's pulse is now around  110.   PLAN:  I advised the patient's mother to go ahead and give the dose of 100  mg of Toprol now, and to call me in 2 hours with a repeat pulse check.  If  she continues to have persistent tachycardia, 100 or above, she is to then  given an additional 50 mg of Toprol.                                   Gene Serpe, PA-C   GS/MedQ  DD:  07/08/2006  DT:  07/08/2006  Job #:  [id number]

## 2011-04-30 NOTE — Consult Note (Signed)
[name], [name] NO.:  [id number]   MEDICAL RECORD NO.:  [id number]          PATIENT TYPE:  EMS   LOCATION:  MAJO                         FACILITY:  [facility]   PHYSICIAN:  [name], [name]     DATE OF BIRTH:  Sep 16, 1981   DATE OF CONSULTATION:  06/13/2006  DATE OF DISCHARGE:                                   CONSULTATION   PRIMARY CARE PHYSICIAN:  [name], [name]   REASON FOR CONSULTATION:  Supraventricular tachycardia.   HISTORY OF PRESENT ILLNESS:  [name] is a very pleasant 30 year old  woman with a history of cerebral palsy with a history of SVT and sinus  tachycardia, who was recently seen in the emergency room on June 11, 2006,  by [name].  She apparently received 18 mg of Adenosine, as well as IV  diltiazem and IV Lopressor.  Her heart rate eventually slowed down to 96  beats per minute and she was discharged home.  She had been seen in the  office back in March by [name], along with [name].  At that time,  they had recommended a CT scan to rule out pulmonary emboli.  However, this  was never done and, in reviewing the case.  [name] felt this was not  necessary.  She did have a TSH performed on March 27, and this was normal at  1.16.  She had her Lopressor increased recently and returns to the office  today for followup and further medication titration as tolerated.  However,  she is back in SVT today.  She does have episodes of sinus tachycardia in  the office, as well.  Her rates have been anywhere from 159 to 180.  I did  try carotid massage, without success.  She feels dizzy.  She also notes some  atypical chest discomfort and shortness of breath.  She denies any syncope.   PAST MEDICAL HISTORY:  As noted above, is significant for cerebral palsy.   MEDICATIONS:  Lidoderm patch, Singulair daily, Cymbalta 60 mg daily,  metoprolol 2 mg twice daily.   ALLERGIES:  Neurontin.   SOCIAL HISTORY:  Denies any tobacco or  alcohol abuse.   FAMILY HISTORY:  Mother is 57 with an unknown heart disease.  The patient  has a half-brother, age 81, no known heart disease.   REVIEW OF SYSTEMS:  See HPI.  Denies any fever, chills, cough.  No  hematochezia, hematuria or dysuria.  Rest of review of systems is negative.   PHYSICAL EXAMINATION:  GENERAL:  She is a young female, who has obvious  spasticity in her upper and lower extremities, in no acute distress.  VITAL SIGNS:  Heart rate 180, blood pressure 129/91.  HEENT:  Head normocephalic, atraumatic.  PERRLA.  EOMI.  Sclerae clear.  Oropharynx pink without exudate.  NECK:  Without lymphadenopathy.  ENDOCRINE:  Without thyromegaly.  CARDIAC EXAM:  S1, S2, rapid regular rhythm.  LUNGS:  Clear to auscultation bilaterally.  ABDOMEN:  Soft, nontender, normoactive bowel sounds.  No organomegaly.  EXTREMITIES:  Without edema.  Calves are soft,  nontender.  MUSCULOSKELETAL:  As noted above, she has obvious spasticity in upper and  lower extremities.  SKIN:  Warm and dry.   Electrocardiogram reveals sinus tachycardia with a heart rate of 159, no  acute changes.   IMPRESSION:  1.  Supraventricular tachycardia/sinus tachycardia.  2.  Cerebral palsy.   PLAN:  I have discussed the patient's case today with [name].  We will  plan to transport her over to [facility] via EMS.  [name],  who knows the patient well, is the doctor of the day in the hospital today  and will see the patient in the ER.  Hopefully, we will be able to convert  her in the emergency room and further titrate her medications.      Tereso [name], P.A.    ______________________________  [name], [name]    SW/MEDQ  D:  06/13/2006  T:  06/13/2006  Job:  [id number]   cc:   [name], [name]  Fax: [phone]

## 2011-04-30 NOTE — Assessment & Plan Note (Signed)
 HEALTHCARE                           ELECTROPHYSIOLOGY OFFICE NOTE   [name], [name]                      MRN:          [id number]  DATE:08/12/2006                            DOB:          05/11/1981    [name] comes in with her mom today.  The issue is what to do about her  persistent tachy palpitations.  They are accompanied by shortness of breath.  They are also accompanied by chest pain which last hours and days at a time.  I have reviewed with them the concern that this may be an atopic focus and  may also be inappropriate sinus tachycardia.  It clearly is adrenergically  responsive both to tilt table testing and stress.  It is, however, not been  significantly touched by Toprol 150 b.i.d. or Florinef.   On examination today, her pulse is back over 150.  Her blood pressure was  elevated, also, at 146/101.   We will plan to discontinue her Florinef given her hypertension and we have  talked about the potential roll of EP study with RF catheter ablation, the  need for general anesthesia, and the roll of endocardial mapping, and the  expectations which are going to be difficult to know what we will be able to  accomplish, but I realize that [name] is very disabled by her current  symptoms and I am not sure apart from drug therapy what else that we could  do to try and control it.  A thought occurs to me as I dictate that exposure  to an anti-arrhythmic drug as a preliminary might be of value and I will  mention that to the family.                                   Duke Salvia, MD, [facility]   SCK/MedQ  DD:  08/12/2006  DT:  08/13/2006  Job #:  [id number]

## 2011-04-30 NOTE — H&P (Signed)
[name], [name] NO.:  [id number]   MEDICAL RECORD NO.:  [id number]          PATIENT TYPE:  INP   LOCATION:  1833                         FACILITY:  [facility]   PHYSICIAN:  [place] Bing, [name] LHCDATE OF BIRTH:  1981/10/31   DATE OF PROCEDURE:  DATE OF DISCHARGE:                      STAT - MUST CHANGE TO CORRECT WORK TYPE   CHIEF COMPLAINT:  Shortness of breath and tachycardia.   HISTORY OF PRESENT ILLNESS:  This is a 30 year old woman with a history of  cerebral palsy and recently diagnosed supraventricular tachycardia that was  originally thought to be due to her Cymbalta.  However this medication had  been discontinued and patient continued to have problems with symptomatic  palpitations as a result of her supraventricular tachycardia.  There is also  some concern that this may be due to some autonomic dysregulation and the  patient had been started on Toprol initially p.r.n. as well as Ativan to  hopefully control her symptoms. She has been seen by Dr. Diona Browner as well as  [facility] who have all recommended at this point in time  medical management of the patient's SVT.  The patient was seen in Dr.  Ival Bible office on July 06, 2006 at which time her Toprol XL was changed  to 100 mg b.i.d. Unfortunately the patient continued to have palpations, the  last episode were 2 days ago at which point in time she called her primary  care physician who recommended the patient increase her Toprol to 150 mg  b.i.d. and also continue to take her Ativan p.r.n.  The patient did this and  her symptoms did resolve.  However the patient did not have follow up with  cardiology for another 3 months.   Today the patient was at home and was lying down for rest when she had the  sudden onset of palpitations, this was unprovoked by any stimuli or anxiety.  She states that she became profoundly short of breath which is her typical  symptom when her heart does  race. She says additionally unlike her previous  episodes, she did have some mild chest discomfort in her left precordium.  This did not radiate to her arm but did radiate to her shoulder blades  bilaterally. She denies any diaphoresis, nausea or vomiting.  As mentioned  before, her primary symptoms were palpitations and shortness of breath. The  patient did take her Toprol this morning however given the concern of the  recurrent symptoms, the patient's mother brought the patient to the  emergency room this evening for further evaluation.  Of note, the patient  states that she has had an echocardiogram in the past and also has been  given adenosine to help her tachycardia without termination of this rhythm.   PAST MEDICAL HISTORY:  1.  Cerebral palsy.  2.  Supraventricular tachycardia as described above.  3.  History of autonomic dysfunction.   ALLERGIES:  NEURONTIN, CYMBALTA - thought to have caused her  supraventricular tachycardia.   MEDICATIONS:  1.  Toprol 150 mg b.i.d.  2.  Prozac 10 mg daily.  3.  Ativan 0.5 mg  p.r.n.   SOCIAL HISTORY:  She lives with her Mom. She does not have any habits. She  does not work. She is permanently disabled from her cerebral palsy.   FAMILY HISTORY:  Mother has a congenital heart disease that was operated on  at age 70, she did not know the details of this. She also has diabetes. Her  father is otherwise healthy.   REVIEW OF SYSTEMS:  The patient denies any fevers, chills, sweats, weight  loss, headaches, nasal discharge, easy bleeding or bruising. No vision or  hearing changes, light headedness, dizziness, dyspnea on exertion, PND or  edema. She denies any hesitancy, frequency or dysuria as well as hematuria.  She denies any nausea, vomiting, diarrhea, bright red blood per rectum,  melena, hematemesis, abdominal pain or change in her bowel habits.  She  denies heat or cold intolerance.  No skin or hair changes. Patient's mother  does report  the patient has had decreased appetite which is more pronounced  when the patient  does have these episodes of palpitations but does note  that her p.o. intake has been remarkably reduced recently. Remaining review  of systems are that as in HPI, otherwise reported as negative.   PHYSICAL EXAMINATION:  VITAL SIGNS:  Temperature 98.2, pulse in the 160's,  respiratory rate of 18, blood pressure 128/84, O2 saturation is 99% on room  air.  GENERAL:  She is alert and oriented x3, in mild distress with complaints of  some mild shortness of breath and palpitations.  HEENT EXAM:  Normocephalic, atraumatic. Pupils are equal, round and reactive  to light and accommodation. Extraocular movements are intact.  NECK:  Supple with no lymphadenopathy. She has no carotid bruits, her  carotid upstrokes are brisk and symmetric bilaterally. Her JVP is flat.  LUNGS:  Clear to auscultation bilaterally without any wheezes, rhonchi or  rales.  CARDIOVASCULAR EXAM:  Tachycardic with a normal S1, S2. No murmurs, rubs, or  gallops.  PULSES:  She has 2+ radial and posterior tibial pulses, symmetric  bilaterally.  ABDOMEN:  Positive bowel sounds, soft, nontender, nondistended without any  organomegaly.  EXTREMITIES:  No clubbing, cyanosis or edema. No rashes or petechiae.  MUSCULOSKELETAL:  She has paused strength secondary to cerebral palsy.  NEUROLOGIC EXAM:  Grossly intact.   LABORATORY DATA:  A Chem-7, CBC and TSH are pending.  An EKG done shows  sinus tachycardia, possible atrial tachycardia at a rate of 149 with a right  axis without any ST or T wave changes, all of her durations are within  normal limits. She had an echo done on March 07, 2006 that was reviewed and  the computer system showed an ejection fraction of greater than 55%,  possible mitral valve prolapse.   ASSESSMENT:  1.  Supraventricular tachycardia with continued symptoms. This certainly     looks like an atrial tachycardia that is not  being adequately      controlled. Other possible etiologies are an inappropriate sinus      tachycardia or atrial flutter with a 2:1 block.   PLAN:  The patient will be admitted to a step down unit. I have started her  on diltiazem IV drip here in the emergency room in order to possibly slow  her rate down.  Will continue her on her beta blocker. If this does not  adequately control her rate, I would suggest the patient be started on an  antiarrhythmic such as sotalol or another antiarrhythmic for better control  of this supraventricular tachycardia. She also may benefit from ablation  therapy if  this continues to be a problem.  I have repeated a TSH. She will also have a  repeat 12-lead EKG in the morning. I have asked the nurses to titrate the  diltiazem drip accordingly to keep her blood pressures about 100 and to keep  her heart rate below 120.     ______________________________  Bimal R. Sherryll Burger, MD      [place] Bing, [name] [facility]  Electronically Signed    BRS/MEDQ  D:  07/10/2006  T:  07/10/2006  Job:  [id number]

## 2011-04-30 NOTE — Assessment & Plan Note (Signed)
[facility]                                   ON-CALL NOTE   TYNEISHA, HEGEMAN                      MRN:          [id number]  DATE:09/02/2006                            DOB:          1981-05-28    PRIMARY CARDIOLOGIST:  [name], [name]   SECONDARY CARDIOLOGIST:  Duke Salvia, [name]   Ms. Sabado's mother called this evening because Ms. Vandyke's heart rate  was up. Her mother stated that she had been busy doing some things and had  gotten upset once or twice, and now her heart rate was up, and her mother  was concerned about what she should do. I discussed the situation with her  mother and advised that the best thing was to take her evening dose of  Toprol and for her to get some rest. Her mother stated that she had had her  evening dose of Toprol about an hour a go. I advised her mother that Ms.  Conlee should continue to rest and continue to relax and try to the Toprol  XL time to kick in. Ms. Viramontes is not having any dizziness, any shortness  of breath, or any other acute illness symptoms. Ms. Cuttino mother stated  that Ms. Diamond wished for me to call, and that is why she was contacting  us.   Ms. Staffa mother stated that she would keep her quiet and attempt to  make her comfortable and let her relax around the house tonight. I advised  that if she began having problems including chest pain or shortness of  breath that she should call 911 and proceed directly to the emergency room.  She is on large doses of Toprol-XL, and I am therefore reluctant to increase  it again, and she took 100 mg not very long ago. Mother indicated  understanding and stated that Dr. Graciela Husbands had told her that it would not be  that long until the ablation. Ms. Quast mother stated that she and Ms.  Ra would try to hold out until then.                                   Theodore Demark, PA-C                                Noralyn Pick. Eden Emms,  MD, [facility]   RB/MedQ  DD:  09/02/2006  DT:  09/06/2006  Job #:  [id number]

## 2011-04-30 NOTE — Assessment & Plan Note (Signed)
[facility]                                   ON-CALL NOTE   [name], [name]                      MRN:          [id number]  DATE:09/04/2006                            DOB:          03/21/1981    Ms. Holten's mother, [name], called in this evening stating that [name]'s  heart rate is 240 and that she is very anxious and feels like she might pass  out.  She said they got the 240 reading on an automated blood pressure cuff.  I asked if she had received her anxiolytics today, and Ms. Coverdale said that  she is no longer giving either Xanax or Ativan to her daughter.  Throughout  my conversation with the patient's mother, I could hear the patient in the  background yelling and sounding fairly anxious.  I advised that they come on  into the [facility] ED for additional evaluation, as she does have a  history, not only of sinus tachycardia, but also SVT and if she is truly  going at 240 beats per minute, we may be dealing with SVT.  They are coming  into the ED.                                   Nicolasa Ducking, ANP                                Duke Salvia, MD, [facility]   CB/MedQ  DD:  09/04/2006  DT:  09/06/2006  Job #:  [id number]

## 2011-04-30 NOTE — Discharge Summary (Signed)
NAMEDAZJA, HOUCHIN NO.:  [id number]   MEDICAL RECORD NO.:  [id number]          PATIENT TYPE:  INP   LOCATION:  2020                         FACILITY:  [facility]   PHYSICIAN:  Duke Salvia, MD, FACCDATE OF BIRTH:  1981/01/13   DATE OF ADMISSION:  09/10/2006  DATE OF DISCHARGE:  09/19/2006                                 DISCHARGE SUMMARY   TIME FOR THIS EXAM AND DISCHARGE:  45 minutes.   PRINCIPAL DIAGNOSES:  1. Admitted with recurrent symptomatic atrial tachycardia:  Anorexia,      insomnia, fatigue.  2. Status post re-look electrophysiology study, ESI-guided mapping,      attempted radiofrequency catheter ablation September 15, 2006.      a.     BUT mapping demonstrates severe atriopathy with multiple atrial       foci.  3. Persistent hypo-attention on rate control medications.  4. Fairly optimal heart rate achieved in the last hours on:      a.     Flecainide.      b.     Cardizem.      c.     Inderal.  5. Metabolic imbalance requiring consistent, persistent replenishment of      both potassium and magnesium.  6. Chest wall pain which causes cramping and spasticity.      a.     Better on indomethacin 25 mg t.i.d. and Protonix 40 mg daily.   SECONDARY DIAGNOSES:  1. Symptomatic atrial tachycardia, status post radiofrequency catheter      ablation August 22, 2006,  for a right atrial cristal tachycardia.  2. Cerebral palsy.  3. Chronic back pain.  4. We have history of bilateral hamstring and right heel cord release.  5. History of other orthopedic surgeries.   The patient has allergies to CYMBALTA and NEURONTIN.   PROCEDURE:  As mentioned above, September 15, 2006, invasive electrophysiology  study, electroanatomical mapping and attempted radiofrequency catheter  ablation with mapping demonstrating diffuse atriopathy, Dr. Sherryl Manges.   BRIEF HISTORY:  Ms. Essner is 30 year old female.  She has a history of  symptomatic atrial tachyarrhythmias.  She  underwent electrophysiology study  with the [facility] mapping/radiofrequency catheter ablation of right atrial  cristal tachycardia on August 22, 2006.  This study was done under  general anesthesia.  Tachycardia was reduced from 150 beats per minute to a  sinus tachycardia in the range of 90-100 beats per minute.  Post procedure  the patient was hypotensive and had nausea.  At the time of discharge on  September 12, these had resolved.  Also, her heart rate had steadily  decreased from tachycardic range into low 100s to finally 85 beats per  minute on discharge.  Because of hypotension, her Toprol XL dose was reduced  to 50 mg daily.  Prior to the ablation se was taking 150 mg twice daily.   The patient has had recurrence of high rates as early as 48 hours after the  discharge from [facility].  She presented in tachycardia to the  emergency room Sunday, September 16.  She was  examined and discharged.  She  also an office visit with Dr. Graciela Husbands on September 20.  At the office visit  her Toprol was increased from 50 mg daily to 50 mg twice daily.  Unfortunately, her tachycardia has been incessant since that time.  She was  sent to the emergency room once more on September 23, had a workup but once  again was sent home.  The patient says she cannot sleep, has no appetite,  and that she is constantly fatigued.  At the office visit on September 20, a  re-look study with [facility] under general was proposed for October 10 here at  [facility].   The patient needs relief of symptoms, and we will that the patient with IV  Cardizem and possible starting an antiarrhythmic as suggested by Dr.  Diona Browner.  This suggestion was run by Dr. Graciela Husbands.  He agreed with Dr.  Diona Browner, and she will be started on flecainide 50 mg b.i.d.  On her  echocardiogram March 2007, ejection fraction was 50-60%.  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   HOSPITAL COURSE:  The patient admitted through the emergency room on  September 27, her heart rate in the 120s to 140s, had an atrial tachycardia.  She was started on IV Cardizem, which had worked well during a prior  hospital visit.  By hospital day #3 she was noted to have a magnesium 1.0  and low potassium as well.  This began a systematic treatment of potassium  and magnesium, which were persistently low throughout this admission unless  high doses of both potassium and magnesium were given.  Potassium doses as  high as 120 mEq daily as well as magnesium doses of 1600 mg daily were  attained and maintained this admission.  As early as October 1 the patient  had the first of a series of waves of what seemed to be crushing chest pain.  These were very atypical for cardiac pain and, indeed, when the patient was  given sublingual nitroglycerin, it did not help.  She did have some relief  with both Valium and Skelaxin.  On postoperative #6, the IV Cardizem was  discontinued.  The patient was maintained on Cardizem orally 180 mg daily.  Although on Cardizem on heart rate seemed to be modulating below 100 beats  per minute, it would still spike up for periods of 4-5 hours at a time.  The  Cardizem was discontinued just prior to the re-look electrophysiology study  on October 4.  This study was unsuccessful in eliminating atrial tachycardia  since there were multiple foci identified.  On the other side of  electrophysiology study, the patient was started on flecainide 50 mg twice  daily.  This in combination with Cardizem managed to modulate heart rates  slightly below 100.  The patient was still having these episodic chest wall  pressure-pain episodes, which would occur without predictability.  They were  finally addressed fully by Dr. Sherryl Manges, who began indomethacin and Protonix.  Since starting this regimen on October 5, the patient has had no  complaints of  recurrence of pleuritic pain.  She will continue on this  regimen for about 1 week.  The patient's mother has mentioned that the  patient herself is not eating very well, but she was started on Megace oral  suspension on October 5 but after 4-5 days on Megace, her  mother reports no  improvement in her appetite.  On morning of discharge on a medical regimen  of flecainide 50 mg twice daily, Cardizem 100 mg daily, and Inderal 60 mg  daily, the patient's heart rate for the last 24 hours has been well below  100 beats per minute.  It has been very fairly stable without any sudden  eruptions and certainly nothing over 100 beats per minute.  With regard to  metabolic imbalances, Dr. Graciela Husbands has spoken with the renal service.  They had  nothing additional to offer except to continue replenishing these  electrolytes orally.   The patient discharged on the following medication:  1. Diltiazem 120 mg daily.  2. Inderal 60 mg daily.  3. Indomethacin 25 mg every 8 hours for a 7-day course.  4. Protonix 40 mg daily for a 7-day course.  5. Potassium chloride 40 mEq twice daily.  6. Magnesium oxide 400 mg twice daily.  7. Prozac 20 mg daily.  8. Singulair 10 mg daily.  9. The patient will also be given a prescription for flecainide 50 mg      twice daily to have but not to fill at this time.   She will see Dr. Graciela Husbands in the office Wednesday, October 17, at 1:45, and at  that time of magnesium and potassium will be drawn.   Laboratory studies on the day before discharge, September 18, 2006:  Electrolytes were sodium 138, potassium 4.8, chloride 106, carbonate 28, BUN  is 14, creatinine is 0.9, glucose 118, that is on a regimen of 120 mEq a  day.  Electrolytes on October 7:  The magnesium is 2.1, and that is on a  regimen of 1600 mg of magnesium oxide daily.  Parathyroid hormone was drawn  but is pending at this time.   This discharge 45 minutes.     ______________________________  Maple Mirza,  PA    ______________________________  Duke Salvia, MD, [facility]    GM/MEDQ  D:  09/19/2006  T:  09/21/2006  Job:  [id number]   cc:   Olena Leatherwood [facility]  [name], MD  Tammy R. Collins [place], [name]

## 2011-05-02 ENCOUNTER — Emergency Department: Payer: Self-pay | Admitting: Emergency Medicine

## 2011-05-03 ENCOUNTER — Ambulatory Visit: Payer: Medicaid Other | Admitting: Internal Medicine

## 2011-05-03 ENCOUNTER — Telehealth: Payer: Self-pay | Admitting: Internal Medicine

## 2011-05-03 NOTE — Telephone Encounter (Signed)
Per pt mom calling c/o  Pt daughter  pacer maker not kicking in .

## 2011-05-03 NOTE — Telephone Encounter (Signed)
I called and spoke with the patient's mother. She states the patient feels like her PPM is not working like it should. The device was implanted at [facility] by Dr. Robin Searing, but per the pt's mother, they have been dismissed from the practice there. It is unclear to me why that is, but the pt's mother states she has a lot going on now and she is very stressed out. They have an appointment at 3:15pm today in the [facility] office to f/u with Dr. Graciela Husbands.

## 2011-05-07 ENCOUNTER — Emergency Department (HOSPITAL_COMMUNITY)
Admission: EM | Admit: 2011-05-07 | Discharge: 2011-05-07 | Disposition: A | Discharge status: Home / Self Care | Payer: Medicaid Other | Attending: Emergency Medicine | Admitting: Emergency Medicine

## 2011-05-07 DIAGNOSIS — G809 Cerebral palsy, unspecified: Secondary | ICD-10-CM | POA: Insufficient documentation

## 2011-05-07 DIAGNOSIS — R0602 Shortness of breath: Secondary | ICD-10-CM | POA: Insufficient documentation

## 2011-05-07 DIAGNOSIS — T82897A Other specified complication of cardiac prosthetic devices, implants and grafts, initial encounter: Secondary | ICD-10-CM | POA: Insufficient documentation

## 2011-05-07 DIAGNOSIS — Y831 Surgical operation with implant of artificial internal device as the cause of abnormal reaction of the patient, or of later complication, without mention of misadventure at the time of the procedure: Secondary | ICD-10-CM | POA: Insufficient documentation

## 2011-07-21 ENCOUNTER — Emergency Department: Payer: Self-pay | Admitting: Emergency Medicine

## 2011-08-27 ENCOUNTER — Emergency Department: Payer: Self-pay | Admitting: Emergency Medicine

## 2011-09-09 LAB — BASIC METABOLIC PANEL
CO2: 30
Calcium: 9.5
Chloride: 103
Creatinine, Ser: 0.72
GFR calc Af Amer: >60
Glucose, Bld: 111 — ABNORMAL HIGH

## 2011-09-12 ENCOUNTER — Emergency Department (HOSPITAL_COMMUNITY)
Admission: EM | Admit: 2011-09-12 | Discharge: 2011-09-12 | Disposition: A | Discharge status: Home / Self Care | Payer: Medicaid Other | Attending: Emergency Medicine | Admitting: Emergency Medicine

## 2011-09-12 DIAGNOSIS — G809 Cerebral palsy, unspecified: Secondary | ICD-10-CM | POA: Insufficient documentation

## 2011-09-12 DIAGNOSIS — IMO0002 Reserved for concepts with insufficient information to code with codable children: Secondary | ICD-10-CM | POA: Insufficient documentation

## 2011-09-12 DIAGNOSIS — Z950 Presence of cardiac pacemaker: Secondary | ICD-10-CM | POA: Insufficient documentation

## 2011-09-12 DIAGNOSIS — S20219A Contusion of unspecified front wall of thorax, initial encounter: Secondary | ICD-10-CM | POA: Insufficient documentation

## 2011-09-17 LAB — CBC
HCT: 43.6 % (ref 36.0–46.0)
MCHC: 34.2 g/dL (ref 30.0–36.0)
MCV: 89.1 fL (ref 78.0–100.0)
Platelets: 225 10*3/uL (ref 150–400)
RBC: 4.89 MIL/uL (ref 3.87–5.11)
WBC: 25.1 10*3/uL — ABNORMAL HIGH (ref 4.0–10.5)

## 2011-09-17 LAB — COMPREHENSIVE METABOLIC PANEL
BUN: 20 mg/dL (ref 6–23)
CO2: 25 mEq/L (ref 19–32)
Calcium: 9.6 mg/dL (ref 8.4–10.5)
Chloride: 102 mEq/L (ref 96–112)
Creatinine, Ser: 0.77 mg/dL (ref 0.4–1.2)
GFR calc non Af Amer: >60 mL/min (ref >60)
Total Bilirubin: 0.9 mg/dL (ref 0.3–1.2)

## 2011-09-17 LAB — URINALYSIS, ROUTINE W REFLEX MICROSCOPIC
Glucose, UA: NEGATIVE mg/dL
Nitrite: NEGATIVE
Protein, ur: NEGATIVE mg/dL
Urobilinogen, UA: 0.2 mg/dL (ref 0.0–1.0)

## 2011-09-17 LAB — DIFFERENTIAL
Basophils Absolute: 0.1 10*3/uL (ref 0.0–0.1)
Lymphocytes Relative: 6 % — ABNORMAL LOW (ref 12–46)
Lymphs Abs: 1.5 10*3/uL (ref 0.7–4.0)
Neutro Abs: 23 10*3/uL — ABNORMAL HIGH (ref 1.7–7.7)

## 2011-09-17 LAB — LIPASE, BLOOD: Lipase: 23 U/L (ref 11–59)

## 2011-09-17 LAB — PREGNANCY, URINE: Preg Test, Ur: NEGATIVE

## 2011-09-20 ENCOUNTER — Inpatient Hospital Stay (INDEPENDENT_AMBULATORY_CARE_PROVIDER_SITE_OTHER)
Admission: RE | Admit: 2011-09-20 | Discharge: 2011-09-20 | Disposition: A | Discharge status: Home / Self Care | Payer: Medicaid Other | Source: Ambulatory Visit | Attending: Emergency Medicine | Admitting: Emergency Medicine

## 2011-09-20 DIAGNOSIS — J309 Allergic rhinitis, unspecified: Secondary | ICD-10-CM

## 2011-09-23 LAB — I-STAT 8, (EC8 V) (CONVERTED LAB)
Acid-base deficit: 1
Bicarbonate: 24.6 — ABNORMAL HIGH
Chloride: 104
pCO2, Ven: 41.6 — ABNORMAL LOW
pH, Ven: 7.38 — ABNORMAL HIGH

## 2011-09-23 LAB — WOUND CULTURE: Gram Stain: NONE SEEN

## 2011-09-30 LAB — DIFFERENTIAL
Basophils Absolute: 0.1
Basophils Relative: 1
Eosinophils Absolute: 0.1
Lymphocytes Relative: 29
Neutrophils Relative %: 62

## 2011-09-30 LAB — I-STAT 8, (EC8 V) (CONVERTED LAB)
BUN: 15
Bicarbonate: 23.4
Glucose, Bld: 100 — ABNORMAL HIGH
pCO2, Ven: 34 — ABNORMAL LOW
pH, Ven: 7.445 — ABNORMAL HIGH

## 2011-09-30 LAB — URINALYSIS, ROUTINE W REFLEX MICROSCOPIC
Glucose, UA: NEGATIVE
pH: 6

## 2011-09-30 LAB — URINE MICROSCOPIC-ADD ON

## 2011-09-30 LAB — POCT CARDIAC MARKERS
Myoglobin, poc: 48.4
Operator id: 270111

## 2011-09-30 LAB — CBC
MCHC: 33.1
RBC: 4.28
RDW: 22.1 — ABNORMAL HIGH

## 2011-10-28 ENCOUNTER — Ambulatory Visit: Payer: Self-pay | Admitting: Internal Medicine

## 2011-10-29 ENCOUNTER — Emergency Department: Payer: Self-pay | Admitting: Emergency Medicine

## 2011-11-21 ENCOUNTER — Emergency Department: Payer: Self-pay | Admitting: Internal Medicine

## 2011-11-28 ENCOUNTER — Emergency Department (HOSPITAL_COMMUNITY)
Admission: EM | Admit: 2011-11-28 | Discharge: 2011-11-28 | Disposition: A | Discharge status: Home / Self Care | Payer: Medicaid Other | Attending: Emergency Medicine | Admitting: Emergency Medicine

## 2011-11-28 ENCOUNTER — Other Ambulatory Visit: Payer: Self-pay

## 2011-11-28 ENCOUNTER — Emergency Department (HOSPITAL_COMMUNITY): Payer: Medicaid Other

## 2011-11-28 ENCOUNTER — Encounter (HOSPITAL_COMMUNITY): Payer: Self-pay | Admitting: Adult Health

## 2011-11-28 DIAGNOSIS — G809 Cerebral palsy, unspecified: Secondary | ICD-10-CM | POA: Insufficient documentation

## 2011-11-28 DIAGNOSIS — E876 Hypokalemia: Secondary | ICD-10-CM

## 2011-11-28 DIAGNOSIS — Y849 Medical procedure, unspecified as the cause of abnormal reaction of the patient, or of later complication, without mention of misadventure at the time of the procedure: Secondary | ICD-10-CM | POA: Insufficient documentation

## 2011-11-28 DIAGNOSIS — Z45018 Encounter for adjustment and management of other part of cardiac pacemaker: Secondary | ICD-10-CM

## 2011-11-28 DIAGNOSIS — T82897A Other specified complication of cardiac prosthetic devices, implants and grafts, initial encounter: Secondary | ICD-10-CM | POA: Insufficient documentation

## 2011-11-28 DIAGNOSIS — Z79899 Other long term (current) drug therapy: Secondary | ICD-10-CM | POA: Insufficient documentation

## 2011-11-28 HISTORY — DX: Other chronic pain: G89.29

## 2011-11-28 HISTORY — DX: Dorsalgia, unspecified: M54.9

## 2011-11-28 LAB — POCT I-STAT, CHEM 8
Glucose, Bld: 120 mg/dL — ABNORMAL HIGH (ref 70–99)
HCT: 44 % (ref 36.0–46.0)
Hemoglobin: 15 g/dL (ref 12.0–15.0)
Potassium: 3.2 mEq/L — ABNORMAL LOW (ref 3.5–5.1)
Sodium: 143 mEq/L (ref 135–145)
TCO2: 31 mmol/L (ref 0–100)

## 2011-11-28 MED ORDER — POTASSIUM CHLORIDE CRYS ER 20 MEQ PO TBCR
40.0000 meq | EXTENDED_RELEASE_TABLET | Freq: Once | ORAL | Status: AC
  Administered 2011-11-28: 40 meq via ORAL
  Filled 2011-11-28: qty 1

## 2011-11-28 MED ORDER — MAGNESIUM OXIDE 400 MG PO TABS
400.0000 mg | ORAL_TABLET | Freq: Once | ORAL | Status: DC
  Filled 2011-11-28: qty 1

## 2011-11-28 MED ORDER — POTASSIUM CHLORIDE 20 MEQ PO PACK: 40.0000 meq | PACK | Freq: Once | ORAL | Status: DC

## 2011-11-28 NOTE — ED Notes (Signed)
Mother is very frustrated at the situation and how the med tech won't come out to integrate the pacemaker. Comfort measure given.

## 2011-11-28 NOTE — ED Notes (Signed)
Pt recently fell and was told to have pacemaker integrated when she fell. Mother also reports that the pacemaker has been beeping x 2 years intermittent, at random time. Pt is coherent, mother reports physically heard the pacemaker beep previously.

## 2011-11-28 NOTE — ED Provider Notes (Signed)
  Physical Exam  BP 111/72  Pulse 85  Temp(Src) 98.2 F (36.8 C) (Oral)  Resp 20  SpO2 100%  Physical Exam  Constitutional: No distress.  Pulmonary/Chest: No respiratory distress.  Skin: She is not diaphoretic.    ED Course  Procedures  MDM The patient's pacemaker was interrogated by the [facility] scientific representative. It is functioning within parameters. I discussed the findings with the patient and her mother. They're ready for discharge at this time      Celene Kras, MD 11/28/11 1742

## 2011-11-28 NOTE — ED Notes (Signed)
Pt transported and returned from xray 

## 2011-11-28 NOTE — ED Provider Notes (Addendum)
History     CSN: [id number] Arrival date & time: 11/28/2011 12:53 PM   Chief Complaint  Patient presents with  . Pacemaker Problem   HPI Pt was seen at 1345.  Per pt and her mother, c/o gradual onset and persistence of constantly hearing her pacemaker "beeping" for the past week.  States she was eval at [facility] this past week for same, but mother states "they didn't interrogate her pacer."  States she has a Cards MD appt on Tuesday of this week.  Denies CP/palpitations, no SOB/cough, no abd pain, no N/V/D, no back pain, no syncope/near syncope.   Past Medical History  Diagnosis Date  . Cerebral palsy   . Hypokalemia     now resolved  . 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  . Gitelman syndrome   . Chronic back pain     Past Surgical History  Procedure Date  . Pacemaker insertion   . Orthopedic surgery     History  Substance Use Topics  . Smoking status: Never Smoker   . Smokeless tobacco: Not on file  . Alcohol Use: No    Review of Systems ROS: Statement: All systems negative except as marked or noted in the HPI; Constitutional: Negative for fever and chills. ; ; Eyes: Negative for eye pain, redness and discharge. ; ; ENMT: Negative for ear pain, hoarseness, nasal congestion, sinus pressure and sore throat. ; ; Cardiovascular: Negative for chest pain, palpitations, diaphoresis, dyspnea and peripheral edema. ; ; Respiratory: Negative for cough, wheezing and stridor. ; ; Gastrointestinal: Negative for nausea, vomiting, diarrhea, abdominal pain, blood in stool, hematemesis, jaundice and rectal bleeding. . ; ; Genitourinary: Negative for dysuria, flank pain and hematuria. ; ; Musculoskeletal: Negative for back pain and neck pain. Negative for swelling and trauma.; ; Skin: Negative for pruritus, rash, abrasions, blisters, bruising and skin lesion.; ; Neuro: Negative for headache, lightheadedness and neck stiffness. Negative for weakness,  altered level of consciousness , altered mental status, extremity weakness, paresthesias, involuntary movement, seizure and syncope.     Allergies  Ciprofloxacin; Gabapentin; Prozac; and Sulfa antibiotics  Home Medications   Current Outpatient Rx  Name Route Sig Dispense Refill  . LABETALOL HCL 100 MG PO TABS Oral Take 100 mg by mouth 2 (two) times daily.      Marland Kitchen MAGNESIUM OXIDE 400 MG PO TABS Oral Take 400 mg by mouth 3 (three) times daily.      Marland Kitchen MAGNESIUM SULFATE IN D5W 10-5 MG/ML-% IV SOLN Intravenous Inject 2 g into the vein every 7 (seven) days.      . METHYLPHENIDATE HCL 20 MG PO TABS Oral Take 20 mg by mouth 3 (three) times daily.      Marland Kitchen POTASSIUM CHLORIDE 20 MEQ PO PACK Oral Take 20 mEq by mouth 2 (two) times daily.      Marland Kitchen PREGABALIN 50 MG PO CAPS Oral Take 50 mg by mouth 2 (two) times daily.      Marland Kitchen SPIRONOLACTONE 50 MG PO TABS Oral Take 50 mg by mouth every other day.       BP 111/72  Pulse 85  Temp(Src) 98.2 F (36.8 C) (Oral)  Resp 20  SpO2 100%  Physical Exam 1350: Physical examination:  Nursing notes reviewed; Vital signs and O2 SAT reviewed;  Constitutional: Well developed, Well nourished, Well hydrated, In no acute distress; Head:  Normocephalic, atraumatic; Eyes: EOMI, PERRL, No scleral icterus; ENMT: Mouth and pharynx normal, Mucous membranes moist;  Neck: Supple, Full range of motion, No lymphadenopathy; Cardiovascular: Regular rate and rhythm, No murmur, rub, or gallop; Respiratory: Breath sounds clear & equal bilaterally, No rales, rhonchi, wheezes, or rub, Normal respiratory effort/excursion; Chest: Nontender, Movement normal; Abdomen: Soft, Nontender, Nondistended, Normal bowel sounds; Genitourinary: No CVA tenderness; Extremities: Pulses normal, No tenderness, No edema, No calf edema or asymmetry.; Neuro: AA&Ox3, Major CN grossly intact.  No gross focal motor or sensory deficits in extremities.; Skin: Color normal, Warm, Dry, no rash.    ED Course  Procedures    1400:  EKG does not show pacer spikes, but Mother states pt's pacer does not function above HR 80.  Do see pacer spikes on bedside cardiac monitor when pt's HR low 80's.  John from [employer] called, case discussed:  States pt's pacemaker does not have a "beeping function" which is family's only complaint today, will not interrogate pacer in the ED based on that complaint.  Pt and family informed, mother angered.  States "no one ever believes [place]."  Mother now states the pt "fell" approx 1 week ago "before the beeping started."  Offered to check CXR to assure pt appears as did on previous XR.  Agreeable. Also requesting ED staff to "check her electrolytes."  Will check Istat chem and magnesium.     MDM  MDM Reviewed: nursing note and vitals Interpretation: ECG, x-ray and labs    Date: 11/28/2011  Rate: 107  Rhythm: junctional tachycardia  QRS Axis: normal  Intervals: normal  ST/T Wave abnormalities: normal  Conduction Disutrbances:nonspecific intraventricular conduction delay  Narrative Interpretation:   Old EKG Reviewed: changes noted; previous EKG dated 05/07/2011 was electronic atrial pacemaker.  Results for orders placed during the hospital encounter of 11/28/11  MAGNESIUM      Component Value Range   Magnesium 1.0 (*) 1.5 - 2.5 (mg/dL)  POCT I-STAT, CHEM 8      Component Value Range   Sodium 143  135 - 145 (mEq/L)   Potassium 3.2 (*) 3.5 - 5.1 (mEq/L)   Chloride 101  96 - 112 (mEq/L)   BUN 13  6 - 23 (mg/dL)   Creatinine, Ser 8.46  0.50 - 1.10 (mg/dL)   Glucose, Bld 962 (*) 70 - 99 (mg/dL)   Calcium, Ion 9.52  8.41 - 1.32 (mmol/L)   TCO2 31  0 - 100 (mmol/L)   Hemoglobin 15.0  12.0 - 15.0 (g/dL)   HCT 32.4  40.1 - 02.7 (%)   Dg Chest 2 View  11/28/2011  *RADIOLOGY REPORT*  Clinical Data: Check pacer placement. The patient fell 1 week ago. Shortness of breath and elevated heart rate since that time.  CHEST - 2 VIEW  Comparison: 07/10/2010  Findings: The patient has  right-sided power port.  Tip overlies the level of the superior vena cava.  Transvenous pacemaker leads overlie the right heart. The pacing leads appear unchanged in their positions.  However, at the bifurcation of the pacing wires, there is question of discontinuity. This is best seen on the lateral view, just below the level of the xyphoid.  The lungs are free of focal consolidations and pleural effusions. No evidence for pulmonary edema.  Mild degenerative changes are seen in the spine.  IMPRESSION:  1. Question of damage to the pacer wire at the level of the bifurcation just below the level of the xyphoid, best seen on the lateral view. 2.  Pacing leads themselves appear unchanged, overlying the right side the heart. 2.  Interval placement  of right-sided power port.  No evidence for pneumothorax.  Critical test results telephoned to Dr. Clarene Duke at the time of interpretation on date 11/18/2011 at time 3:22 p.m.  Original Report Authenticated By: Patterson Hammersmith, [name]    1450:  T/C to Cards Dr. Antoine Poche, case discussed, including:  HPI, pertinent PM/SHx, VS/PE, dx testing, ED course and treatment.  CXR finding non-specific.  Requests to call back [facility] to interrogate pacer in ED.  1600:  John from [employer] called, case discussed, will come to ED to interrogate pacemaker.  Potassium and magnesium repleted PO.   4:27 PM:  Sign out to Dr. Linwood Dibbles.      Jazziel Fitzsimmons Allison Quarry, DO 11/28/11 1631  Laray Anger, DO 11/28/11 1632  Laray Anger, DO 11/28/11 [phone]

## 2011-11-28 NOTE — ED Notes (Signed)
Mother states her daughter has a Environmental education officer and states "I hear it beeping"  Pt states she is dizzy.

## 2011-11-28 NOTE — ED Notes (Signed)
Gave pt EKG to Dr Preston Fleeting

## 2011-11-28 NOTE — ED Notes (Addendum)
The Med tech who integrates the pacemaker has spoke with EDP  And is refusing to come integrate the pacemaker believing that it is working normally, also sts that the pt's pacemaker doesn't have a beeping function, and there's no way that it beeps. Family member and patient informed of this information and is very agitated at the situation. Mother sts they have been dealing with this for 2 years and the pacemaker beeps but no body believes her, in addition, mother reports that pt recently fell and need her pacemaker to be check.

## 2011-11-28 NOTE — ED Notes (Signed)
[employer] sts pt's pacemaker has no deficit

## 2011-12-07 ENCOUNTER — Emergency Department: Payer: Self-pay | Admitting: Internal Medicine

## 2011-12-10 ENCOUNTER — Emergency Department: Payer: Self-pay | Admitting: *Deleted

## 2011-12-14 HISTORY — PX: ABDOMINAL HYSTERECTOMY: SHX81

## 2012-01-03 ENCOUNTER — Emergency Department: Payer: Self-pay | Admitting: Emergency Medicine

## 2012-01-03 LAB — BASIC METABOLIC PANEL
BUN: 19 mg/dL — ABNORMAL HIGH (ref 7–18)
Chloride: 103 mmol/L (ref 98–107)
Creatinine: 0.7 mg/dL (ref 0.60–1.30)
EGFR (African American): >60
EGFR (Non-African Amer.): >60
Glucose: 95 mg/dL (ref 65–99)
Potassium: 3.8 mmol/L (ref 3.5–5.1)
Sodium: 142 mmol/L (ref 136–145)

## 2012-01-03 LAB — MAGNESIUM: Magnesium: 0.9 mg/dL — ABNORMAL LOW

## 2012-01-24 ENCOUNTER — Emergency Department: Payer: Self-pay | Admitting: Emergency Medicine

## 2012-01-24 LAB — BASIC METABOLIC PANEL
Anion Gap: 8 (ref 7–16)
Calcium, Total: 9.2 mg/dL (ref 8.5–10.1)
Chloride: 102 mmol/L (ref 98–107)
Creatinine: 0.75 mg/dL (ref 0.60–1.30)
Glucose: 84 mg/dL (ref 65–99)
Osmolality: 276 (ref 275–301)
Potassium: 2.9 mmol/L — ABNORMAL LOW (ref 3.5–5.1)

## 2012-01-24 LAB — CBC
HGB: 12.4 g/dL (ref 12.0–16.0)
MCH: 30.9 pg (ref 26.0–34.0)
MCV: 91 fL (ref 80–100)
RBC: 3.99 10*6/uL (ref 3.80–5.20)
WBC: 11.4 10*3/uL — ABNORMAL HIGH (ref 3.6–11.0)

## 2012-01-27 ENCOUNTER — Ambulatory Visit: Payer: Self-pay | Admitting: Cardiovascular Disease

## 2012-03-13 ENCOUNTER — Ambulatory Visit: Payer: Self-pay | Admitting: Internal Medicine

## 2012-04-15 ENCOUNTER — Emergency Department (HOSPITAL_COMMUNITY)
Admission: EM | Admit: 2012-04-15 | Discharge: 2012-04-15 | Disposition: A | Discharge status: Home / Self Care | Payer: Medicaid Other | Attending: Emergency Medicine | Admitting: Emergency Medicine

## 2012-04-15 ENCOUNTER — Encounter (HOSPITAL_COMMUNITY): Payer: Self-pay | Admitting: Emergency Medicine

## 2012-04-15 DIAGNOSIS — G8929 Other chronic pain: Secondary | ICD-10-CM | POA: Insufficient documentation

## 2012-04-15 DIAGNOSIS — G809 Cerebral palsy, unspecified: Secondary | ICD-10-CM | POA: Insufficient documentation

## 2012-04-15 DIAGNOSIS — H9209 Otalgia, unspecified ear: Secondary | ICD-10-CM | POA: Insufficient documentation

## 2012-04-15 DIAGNOSIS — Z79899 Other long term (current) drug therapy: Secondary | ICD-10-CM | POA: Insufficient documentation

## 2012-04-15 MED ORDER — ANTIPYRINE-BENZOCAINE 5.4-1.4 % OT SOLN
3.0000 [drp] | Freq: Once | OTIC | Status: AC
  Administered 2012-04-15: 3 [drp] via OTIC
  Filled 2012-04-15: qty 10

## 2012-04-15 MED ORDER — LORATADINE 10 MG PO TABS: 10.0000 mg | ORAL_TABLET | Freq: Every day | ORAL | Status: DC

## 2012-04-15 NOTE — ED Provider Notes (Signed)
Medical screening examination/treatment/procedure(s) were performed by non-physician practitioner and as supervising physician I was immediately available for consultation/collaboration.  Landan Fedie, MD 04/15/12 2332 

## 2012-04-15 NOTE — ED Notes (Signed)
Pt reports pain in both ears x 1 week. Denies fever

## 2012-04-15 NOTE — Discharge Instructions (Signed)
Follow up with Dr. Suszanne Conners for further evaluation of ear pain. Recommend Claritin as directed for symptomatic relief. Return here with any worsening pain, high fever or new concern.  Otalgia The most common reason for this in children is an infection of the middle ear. Pain from the middle ear is usually caused by a build-up of fluid and pressure behind the eardrum. Pain from an earache can be sharp, dull, or burning. The pain may be temporary or constant. The middle ear is connected to the nasal passages by a short narrow tube called the Eustachian tube. The Eustachian tube allows fluid to drain out of the middle ear, and helps keep the pressure in your ear equalized. CAUSES  A cold or allergy can block the Eustachian tube with inflammation and the build-up of secretions. This is especially likely in small children, because their Eustachian tube is shorter and more horizontal. When the Eustachian tube closes, the normal flow of fluid from the middle ear is stopped. Fluid can accumulate and cause stuffiness, pain, hearing loss, and an ear infection if germs start growing in this area. SYMPTOMS  The symptoms of an ear infection may include fever, ear pain, fussiness, increased crying, and irritability. Many children will have temporary and minor hearing loss during and right after an ear infection. Permanent hearing loss is rare, but the risk increases the more infections a child has. Other causes of ear pain include retained water in the outer ear canal from swimming and bathing. Ear pain in adults is less likely to be from an ear infection. Ear pain may be referred from other locations. Referred pain may be from the joint between your jaw and the skull. It may also come from a tooth problem or problems in the neck. Other causes of ear pain include:  A foreign body in the ear.   Outer ear infection.   Sinus infections.   Impacted ear wax.   Ear injury.   Arthritis of the jaw or TMJ problems.    Middle ear infection.   Tooth infections.   Sore throat with pain to the ears.  DIAGNOSIS  Your caregiver can usually make the diagnosis by examining you. Sometimes other special studies, including x-rays and lab work may be necessary. TREATMENT   If antibiotics were prescribed, use them as directed and finish them even if you or your child's symptoms seem to be improved.   Sometimes PE tubes are needed in children. These are little plastic tubes which are put into the eardrum during a simple surgical procedure. They allow fluid to drain easier and allow the pressure in the middle ear to equalize. This helps relieve the ear pain caused by pressure changes.  HOME CARE INSTRUCTIONS   Only take over-the-counter or prescription medicines for pain, discomfort, or fever as directed by your caregiver. DO NOT GIVE CHILDREN ASPIRIN because of the association of Reye's Syndrome in children taking aspirin.   Use a cold pack applied to the outer ear for 15 to 20 minutes, 3 to 4 times per day or as needed may reduce pain. Do not apply ice directly to the skin. You may cause frost bite.   Over-the-counter ear drops used as directed may be effective. Your caregiver may sometimes prescribe ear drops.   Resting in an upright position may help reduce pressure in the middle ear and relieve pain.   Ear pain caused by rapidly descending from high altitudes can be relieved by swallowing or chewing gum. Allowing infants to  suck on a bottle during airplane travel can help.   Do not smoke in the house or near children. If you are unable to quit smoking, smoke outside.   Control allergies.  SEEK IMMEDIATE MEDICAL CARE IF:   You or your child are becoming sicker.   Pain or fever relief is not obtained with medicine.   You or your child's symptoms (pain, fever, or irritability) do not improve within 24 to 48 hours or as instructed.   Severe pain suddenly stops hurting. This may indicate a ruptured  eardrum.   You or your children develop new problems such as severe headaches, stiff neck, difficulty swallowing, or swelling of the face or around the ear.  Document Released: 07/16/2004 Document Revised: 11/18/2011 Document Reviewed: 11/20/2008 [facility] Patient Information 2012 [place], [place].

## 2012-04-15 NOTE — ED Provider Notes (Signed)
History     CSN: [id number]  Arrival date & time 04/15/12  1454   First MD Initiated Contact with Patient 04/15/12 1521      Chief Complaint  Patient presents with  . Otalgia    1 week hx bilat. ear pain    (Consider location/radiation/quality/duration/timing/severity/associated sxs/prior treatment) Patient is a 31 y.o. female presenting with ear pain. The history is provided by the patient.  Otalgia This is a new problem. The current episode started more than 2 days ago. There is pain in the right ear. The problem occurs constantly. The problem has not changed since onset.There has been no fever. The pain is moderate. Pertinent negatives include no rhinorrhea, no sore throat, no vomiting and no cough. Associated symptoms comments: Pain in both ears, worse in the right. No drainage. She reports hot/cold chills but no fever. Per mom at bedside, she has recurrent cerumen impactions and periodic pain..    Past Medical History  Diagnosis Date  . Cerebral palsy   . Hypokalemia     now resolved  . 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  . Gitelman syndrome   . Chronic back pain     Past Surgical History  Procedure Date  . Pacemaker insertion   . Orthopedic surgery     Family History  Problem Relation Age of Onset  . Diabetes Mother   . Hypertension Mother     History  Substance Use Topics  . Smoking status: Never Smoker   . Smokeless tobacco: Not on file  . Alcohol Use: No    OB History    Grav Para Term Preterm Abortions TAB SAB Ect Mult Living                  Review of Systems  Constitutional: Positive for chills. Negative for fever.  HENT: Positive for ear pain. Negative for congestion, sore throat and rhinorrhea.   Respiratory: Negative for cough.   Gastrointestinal: Negative for nausea and vomiting.    Allergies  Ciprofloxacin; Gabapentin; Prozac; and Sulfa antibiotics  Home Medications   Current Outpatient Rx  Name  Route Sig Dispense Refill  . ALBUTEROL SULFATE (2.5 MG/3ML) 0.083% IN NEBU Nebulization Take 2.5 mg by nebulization every 6 (six) hours as needed.    Marland Kitchen LABETALOL HCL 100 MG PO TABS Oral Take 100 mg by mouth 2 (two) times daily.      Marland Kitchen MAGNESIUM OXIDE 400 MG PO TABS Oral Take 400 mg by mouth 3 (three) times daily.      Marland Kitchen MAGNESIUM SULFATE IN D5W 10-5 MG/ML-% IV SOLN Intravenous Inject 2 g into the vein every 7 (seven) days.      . METHYLPHENIDATE HCL 20 MG PO TABS Oral Take 20 mg by mouth 3 (three) times daily.      Marland Kitchen POTASSIUM CHLORIDE 20 MEQ PO PACK Oral Take 20 mEq by mouth 2 (two) times daily.      Marland Kitchen PREGABALIN 50 MG PO CAPS Oral Take 50 mg by mouth 2 (two) times daily.      Marland Kitchen SPIRONOLACTONE 50 MG PO TABS Oral Take 50 mg by mouth every other day.       BP 113/82  Pulse 97  Temp(Src) 98.1 F (36.7 C) (Oral)  Resp 18  SpO2 100%  LMP 04/08/2012  Physical Exam  Constitutional: She is oriented to person, place, and time. She appears well-developed and well-nourished.  HENT:  Head: Normocephalic.  Right Ear: External ear  normal.  Left Ear: External ear normal.       Moderate cerumen without impaction on either ear. No redness, swelling. TM's bilaterally unremarkable - appearing opaque, shiny without redness. Nasal mucosa boggy, mildly edematous. Oropharynx benign.  Neck: Normal range of motion.  Pulmonary/Chest: Effort normal.  Lymphadenopathy:    She has no cervical adenopathy.  Neurological: She is alert and oriented to person, place, and time.       Patient with cerebral palsy, oriented, interactive, gives own history, provides answers to all questions.  Skin: Skin is warm and dry.  Psychiatric: She has a normal mood and affect.    ED Course  Procedures (including critical care time)  Labs Reviewed - No data to display No results found.   No diagnosis found. 1. Otalgia    MDM  Antipyrene drops used with minimal change. Exam benign with normal VS. Will refer to ENT for  recurrent problem. Do not suspect dangerous process.        Rodena Medin, PA-C 04/15/12 [phone]

## 2012-04-21 ENCOUNTER — Ambulatory Visit: Payer: Self-pay | Admitting: Cardiovascular Disease

## 2012-04-22 ENCOUNTER — Emergency Department: Payer: Self-pay | Admitting: Emergency Medicine

## 2012-04-22 LAB — CBC
HCT: 40.8 % (ref 35.0–47.0)
HGB: 14 g/dL (ref 12.0–16.0)
MCHC: 34.2 g/dL (ref 32.0–36.0)
Platelet: 170 10*3/uL (ref 150–440)
RBC: 4.56 10*6/uL (ref 3.80–5.20)
RDW: 13.4 % (ref 11.5–14.5)
WBC: 13.5 10*3/uL — ABNORMAL HIGH (ref 3.6–11.0)

## 2012-04-22 LAB — COMPREHENSIVE METABOLIC PANEL
Albumin: 4.1 g/dL (ref 3.4–5.0)
Alkaline Phosphatase: 72 U/L (ref 50–136)
Anion Gap: 8 (ref 7–16)
Bilirubin,Total: 0.4 mg/dL (ref 0.2–1.0)
Co2: 28 mmol/L (ref 21–32)
EGFR (African American): >60
Glucose: 89 mg/dL (ref 65–99)
Osmolality: 280 (ref 275–301)
Potassium: 3.6 mmol/L (ref 3.5–5.1)
Sodium: 140 mmol/L (ref 136–145)
Total Protein: 7.3 g/dL (ref 6.4–8.2)

## 2012-04-22 LAB — URINALYSIS, COMPLETE
Bilirubin,UR: NEGATIVE
Ketone: NEGATIVE
Nitrite: NEGATIVE
Ph: 5 (ref 4.5–8.0)
Protein: NEGATIVE
RBC,UR: 2 /HPF (ref 0–5)

## 2012-04-22 LAB — PREGNANCY, URINE: Pregnancy Test, Urine: NEGATIVE m[IU]/mL

## 2012-05-11 ENCOUNTER — Emergency Department: Payer: Self-pay | Admitting: Emergency Medicine

## 2012-05-26 ENCOUNTER — Emergency Department: Payer: Self-pay | Admitting: Emergency Medicine

## 2012-05-26 LAB — URINALYSIS, COMPLETE
Blood: NEGATIVE
Nitrite: NEGATIVE
Ph: 5 (ref 4.5–8.0)
RBC,UR: <1 /HPF (ref 0–5)
Specific Gravity: 1.023 (ref 1.003–1.030)
Squamous Epithelial: 4
WBC UR: 2 /HPF (ref 0–5)

## 2012-05-26 LAB — CBC
MCH: 30.1 pg (ref 26.0–34.0)
Platelet: 163 10*3/uL (ref 150–440)
RBC: 4.67 10*6/uL (ref 3.80–5.20)
RDW: 13.2 % (ref 11.5–14.5)
WBC: 10.7 10*3/uL (ref 3.6–11.0)

## 2012-05-26 LAB — COMPREHENSIVE METABOLIC PANEL
Albumin: 4.1 g/dL (ref 3.4–5.0)
Alkaline Phosphatase: 78 U/L (ref 50–136)
Bilirubin,Total: 0.6 mg/dL (ref 0.2–1.0)
Calcium, Total: 9.2 mg/dL (ref 8.5–10.1)
Chloride: 104 mmol/L (ref 98–107)
Co2: 28 mmol/L (ref 21–32)
Creatinine: 0.8 mg/dL (ref 0.60–1.30)
EGFR (African American): >60
EGFR (Non-African Amer.): >60
Osmolality: 286 (ref 275–301)
Potassium: 3.4 mmol/L — ABNORMAL LOW (ref 3.5–5.1)
SGPT (ALT): 31 U/L

## 2012-05-26 LAB — MAGNESIUM: Magnesium: 0.8 mg/dL — ABNORMAL LOW

## 2012-06-27 DIAGNOSIS — G809 Cerebral palsy, unspecified: Secondary | ICD-10-CM | POA: Diagnosis present

## 2012-07-09 ENCOUNTER — Emergency Department (INDEPENDENT_AMBULATORY_CARE_PROVIDER_SITE_OTHER)
Admission: EM | Admit: 2012-07-09 | Discharge: 2012-07-09 | Disposition: A | Discharge status: Home / Self Care | Payer: Medicaid Other | Source: Home / Self Care | Attending: Emergency Medicine | Admitting: Emergency Medicine

## 2012-07-09 ENCOUNTER — Emergency Department (INDEPENDENT_AMBULATORY_CARE_PROVIDER_SITE_OTHER): Payer: Medicaid Other

## 2012-07-09 ENCOUNTER — Emergency Department (HOSPITAL_COMMUNITY)
Admission: EM | Admit: 2012-07-09 | Discharge: 2012-07-09 | Discharge status: Against Medical Advice | Payer: Medicaid Other

## 2012-07-09 ENCOUNTER — Encounter (HOSPITAL_COMMUNITY): Payer: Self-pay | Admitting: *Deleted

## 2012-07-09 DIAGNOSIS — M779 Enthesopathy, unspecified: Secondary | ICD-10-CM

## 2012-07-09 MED ORDER — MELOXICAM 7.5 MG PO TABS: 7.5000 mg | ORAL_TABLET | Freq: Every day | ORAL | Status: AC

## 2012-07-09 NOTE — ED Notes (Signed)
C/o left wrist/ forearm pain today slight swelling - c/o pain

## 2012-07-09 NOTE — ED Provider Notes (Signed)
History     CSN: [id number]  Arrival date & time 07/09/12  1601   First MD Initiated Contact with Patient 07/09/12 1757      Chief Complaint  Patient presents with  . Wrist Pain    (Consider location/radiation/quality/duration/timing/severity/associated sxs/prior treatment) HPI Comments: Pt reports fingers of left hand were hurting 2 days ago, then the pain moved into L wrist.  Denies injury.  Feels wrist is swollen, hurts to use it.  Is L handed.    Patient is a 31 y.o. female presenting with wrist pain. The history is provided by the patient and a parent.  Wrist Pain This is a new problem. The current episode started 2 days ago. The problem occurs constantly. The problem has not changed since onset.Exacerbated by: activity. Nothing relieves the symptoms. She has tried nothing for the symptoms.    Past Medical History  Diagnosis Date  . Cerebral palsy   . Hypokalemia     now resolved  . 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  . Gitelman syndrome   . Chronic back pain     Past Surgical History  Procedure Date  . Pacemaker insertion   . Orthopedic surgery     Family History  Problem Relation Age of Onset  . Diabetes Mother   . Hypertension Mother     History  Substance Use Topics  . Smoking status: Never Smoker   . Smokeless tobacco: Not on file  . Alcohol Use: No    OB History    Grav Para Term Preterm Abortions TAB SAB Ect Mult Living                  Review of Systems  Constitutional: Negative for fever and chills.  Musculoskeletal:       Wrist pain and swelling  Skin: Negative for color change and wound.  Neurological: Negative for numbness.    Allergies  Ciprofloxacin; Cymbalta; Gabapentin; Prozac; and Sulfa antibiotics  Home Medications   Current Outpatient Rx  Name Route Sig Dispense Refill  . ALBUTEROL SULFATE (2.5 MG/3ML) 0.083% IN NEBU Nebulization Take 2.5 mg by nebulization every 6 (six) hours as  needed.    Marland Kitchen LABETALOL HCL 100 MG PO TABS Oral Take 100 mg by mouth 2 (two) times daily.      Marland Kitchen MAGNESIUM SULFATE IN D5W 10-5 MG/ML-% IV SOLN Intravenous Inject 2 g into the vein every 7 (seven) days.      . METHYLPHENIDATE HCL 20 MG PO TABS Oral Take 20 mg by mouth 3 (three) times daily.      Marland Kitchen PREGABALIN 50 MG PO CAPS Oral Take 50 mg by mouth 2 (two) times daily.      Marland Kitchen LORATADINE 10 MG PO TABS Oral Take 1 tablet (10 mg total) by mouth daily. 7 tablet 0  . MAGNESIUM OXIDE 400 MG PO TABS Oral Take 400 mg by mouth 3 (three) times daily.      . MELOXICAM 7.5 MG PO TABS Oral Take 1 tablet (7.5 mg total) by mouth daily. 10 tablet 0  . POTASSIUM CHLORIDE 20 MEQ PO PACK Oral Take 20 mEq by mouth 2 (two) times daily.      Marland Kitchen SPIRONOLACTONE 50 MG PO TABS Oral Take 50 mg by mouth every other day.       BP 127/78  Pulse 97  Temp 98.3 F (36.8 C) (Oral)  Resp 20  SpO2 100%  LMP 06/09/2012  Physical Exam  Constitutional: She appears well-developed and well-nourished. No distress.  Cardiovascular:  Pulses:      Radial pulses are 2+ on the left side.  Pulmonary/Chest: Effort normal.  Musculoskeletal:       Left wrist: She exhibits decreased range of motion, tenderness and bony tenderness. She exhibits no swelling, no effusion, no crepitus and no deformity.       Pain with ROM L wrist, none with ROM L fingers/hand    ED Course  Procedures (including critical care time)  Labs Reviewed - No data to display Dg Wrist Complete Left  07/09/2012  *RADIOLOGY REPORT*  Clinical Data: History of cerebral palsy.  Left wrist pain.  No known injury.  LEFT WRIST - COMPLETE 3+ VIEW  Comparison: None.  Findings: Positioning is nonstandard. There is no evidence for acute fracture or dislocation.  No soft tissue foreign body or gas identified.  IMPRESSION: Negative exam.  Original Report Authenticated By: Patterson Hammersmith, [name]     1. Tendonitis       MDM  Discussed with Dr. Lorenz Coaster.  Uncertain if  reduced ROM in L wrist is due to contracture or usual position of L wrist.  As wrist is painful and pain started in fingers, is most likely tendonitis.  Pt has orthopedist she can f/u with.         Cathlyn Parsons, NP 07/09/12 [phone]

## 2012-07-10 NOTE — ED Provider Notes (Signed)
Medical screening examination/treatment/procedure(s) were performed by non-physician practitioner and as supervising physician I was immediately available for consultation/collaboration.  Taleigh Gero, [name]   Brodrick Curran C Josiah Nieto, MD 07/10/12 1303 

## 2012-07-22 ENCOUNTER — Emergency Department: Payer: Self-pay | Admitting: Emergency Medicine

## 2012-07-22 LAB — URINALYSIS, COMPLETE
Bilirubin,UR: NEGATIVE
Glucose,UR: NEGATIVE mg/dL (ref 0–75)
Ketone: NEGATIVE
Leukocyte Esterase: NEGATIVE
Ph: 6 (ref 4.5–8.0)
RBC,UR: 1 /HPF (ref 0–5)
WBC UR: 2 /HPF (ref 0–5)

## 2012-07-26 ENCOUNTER — Ambulatory Visit: Payer: Self-pay | Admitting: Obstetrics & Gynecology

## 2012-07-26 LAB — CBC
HCT: 39.2 % (ref 35.0–47.0)
MCH: 30.7 pg (ref 26.0–34.0)
MCV: 88 fL (ref 80–100)
Platelet: 158 10*3/uL (ref 150–440)
RBC: 4.44 10*6/uL (ref 3.80–5.20)
RDW: 13.2 % (ref 11.5–14.5)
WBC: 11.6 10*3/uL — ABNORMAL HIGH (ref 3.6–11.0)

## 2012-07-26 LAB — BASIC METABOLIC PANEL
Anion Gap: 7 (ref 7–16)
BUN: 11 mg/dL (ref 7–18)
Calcium, Total: 8.7 mg/dL (ref 8.5–10.1)
Co2: 30 mmol/L (ref 21–32)
Creatinine: 0.7 mg/dL (ref 0.60–1.30)
EGFR (African American): >60
EGFR (Non-African Amer.): >60
Osmolality: 278 (ref 275–301)
Potassium: 3 mmol/L — ABNORMAL LOW (ref 3.5–5.1)
Sodium: 140 mmol/L (ref 136–145)

## 2012-07-26 LAB — MAGNESIUM: Magnesium: 0.8 mg/dL — ABNORMAL LOW

## 2012-07-30 ENCOUNTER — Emergency Department: Payer: Self-pay | Admitting: Emergency Medicine

## 2012-07-30 LAB — URINALYSIS, COMPLETE
Glucose,UR: NEGATIVE mg/dL (ref 0–75)
Squamous Epithelial: 6
WBC UR: 7 /HPF (ref 0–5)

## 2012-07-30 LAB — COMPREHENSIVE METABOLIC PANEL
Alkaline Phosphatase: 70 U/L (ref 50–136)
Bilirubin,Total: 0.4 mg/dL (ref 0.2–1.0)
Calcium, Total: 8.9 mg/dL (ref 8.5–10.1)
Chloride: 103 mmol/L (ref 98–107)
Co2: 29 mmol/L (ref 21–32)
Creatinine: 0.78 mg/dL (ref 0.60–1.30)
EGFR (African American): >60
EGFR (Non-African Amer.): >60
Glucose: 89 mg/dL (ref 65–99)
SGOT(AST): 30 U/L (ref 15–37)
SGPT (ALT): 24 U/L (ref 12–78)

## 2012-07-30 LAB — CBC
HCT: 42.5 % (ref 35.0–47.0)
MCH: 30.1 pg (ref 26.0–34.0)
Platelet: 141 10*3/uL — ABNORMAL LOW (ref 150–440)
RBC: 4.82 10*6/uL (ref 3.80–5.20)

## 2012-08-03 ENCOUNTER — Ambulatory Visit: Payer: Self-pay | Admitting: Obstetrics & Gynecology

## 2012-08-03 LAB — BASIC METABOLIC PANEL
Calcium, Total: 8 mg/dL — ABNORMAL LOW (ref 8.5–10.1)
Chloride: 104 mmol/L (ref 98–107)
Co2: 28 mmol/L (ref 21–32)
Creatinine: 0.72 mg/dL (ref 0.60–1.30)
EGFR (Non-African Amer.): >60
Glucose: 149 mg/dL — ABNORMAL HIGH (ref 65–99)
Osmolality: 281 (ref 275–301)
Potassium: 2.6 mmol/L — ABNORMAL LOW (ref 3.5–5.1)
Sodium: 140 mmol/L (ref 136–145)

## 2012-08-03 LAB — MAGNESIUM: Magnesium: 1.3 mg/dL — ABNORMAL LOW

## 2012-08-04 LAB — BASIC METABOLIC PANEL
Anion Gap: 7 (ref 7–16)
Calcium, Total: 8.5 mg/dL (ref 8.5–10.1)
Chloride: 107 mmol/L (ref 98–107)
Co2: 29 mmol/L (ref 21–32)
Creatinine: 0.83 mg/dL (ref 0.60–1.30)
EGFR (African American): >60
EGFR (Non-African Amer.): >60
Osmolality: 283 (ref 275–301)
Potassium: 3.3 mmol/L — ABNORMAL LOW (ref 3.5–5.1)
Sodium: 143 mmol/L (ref 136–145)

## 2012-08-04 LAB — HEMOGLOBIN: HGB: 11.9 g/dL — ABNORMAL LOW (ref 12.0–16.0)

## 2012-08-19 ENCOUNTER — Emergency Department: Payer: Self-pay | Admitting: Emergency Medicine

## 2012-08-19 LAB — URINALYSIS, COMPLETE
Leukocyte Esterase: NEGATIVE
Nitrite: NEGATIVE
Ph: 5 (ref 4.5–8.0)
Protein: NEGATIVE
RBC,UR: 2 /HPF (ref 0–5)
Specific Gravity: 1.016 (ref 1.003–1.030)
WBC UR: 1 /HPF (ref 0–5)

## 2012-08-19 LAB — CBC
HCT: 36.8 % (ref 35.0–47.0)
HGB: 12.7 g/dL (ref 12.0–16.0)
MCV: 88 fL (ref 80–100)
Platelet: 162 10*3/uL (ref 150–440)
WBC: 12.2 10*3/uL — ABNORMAL HIGH (ref 3.6–11.0)

## 2012-08-19 LAB — COMPREHENSIVE METABOLIC PANEL
Anion Gap: 8 (ref 7–16)
BUN: 13 mg/dL (ref 7–18)
Co2: 28 mmol/L (ref 21–32)
Creatinine: 0.83 mg/dL (ref 0.60–1.30)
EGFR (African American): >60
EGFR (Non-African Amer.): >60
Osmolality: 281 (ref 275–301)
SGOT(AST): 43 U/L — ABNORMAL HIGH (ref 15–37)
SGPT (ALT): 47 U/L (ref 12–78)
Sodium: 141 mmol/L (ref 136–145)
Total Protein: 7.4 g/dL (ref 6.4–8.2)

## 2012-09-22 ENCOUNTER — Emergency Department: Payer: Self-pay | Admitting: Emergency Medicine

## 2012-09-28 ENCOUNTER — Emergency Department: Payer: Self-pay | Admitting: Emergency Medicine

## 2012-09-28 LAB — BASIC METABOLIC PANEL
BUN: 12 mg/dL (ref 7–18)
Calcium, Total: 8.8 mg/dL (ref 8.5–10.1)
Chloride: 105 mmol/L (ref 98–107)
Co2: 27 mmol/L (ref 21–32)
Creatinine: 0.76 mg/dL (ref 0.60–1.30)
EGFR (African American): >60
Glucose: 90 mg/dL (ref 65–99)
Osmolality: 282 (ref 275–301)
Sodium: 142 mmol/L (ref 136–145)

## 2012-09-28 LAB — MAGNESIUM: Magnesium: 0.9 mg/dL — ABNORMAL LOW

## 2012-10-08 ENCOUNTER — Emergency Department: Payer: Self-pay | Admitting: Emergency Medicine

## 2012-10-08 LAB — COMPREHENSIVE METABOLIC PANEL
Albumin: 4.3 g/dL (ref 3.4–5.0)
Alkaline Phosphatase: 74 U/L (ref 50–136)
BUN: 13 mg/dL (ref 7–18)
Bilirubin,Total: 0.4 mg/dL (ref 0.2–1.0)
Chloride: 103 mmol/L (ref 98–107)
Creatinine: 0.86 mg/dL (ref 0.60–1.30)
EGFR (African American): >60
Glucose: 92 mg/dL (ref 65–99)
SGOT(AST): 35 U/L (ref 15–37)
SGPT (ALT): 37 U/L (ref 12–78)
Total Protein: 7.7 g/dL (ref 6.4–8.2)

## 2012-10-08 LAB — URINALYSIS, COMPLETE
Bilirubin,UR: NEGATIVE
Nitrite: NEGATIVE
Ph: 5 (ref 4.5–8.0)
Protein: NEGATIVE
RBC,UR: 3 /HPF (ref 0–5)
Specific Gravity: 1.019 (ref 1.003–1.030)

## 2012-10-08 LAB — CBC
HCT: 40.3 % (ref 35.0–47.0)
MCH: 30.5 pg (ref 26.0–34.0)
MCHC: 34.7 g/dL (ref 32.0–36.0)
Platelet: 154 10*3/uL (ref 150–440)
RDW: 13.2 % (ref 11.5–14.5)
WBC: 13.9 10*3/uL — ABNORMAL HIGH (ref 3.6–11.0)

## 2012-10-08 LAB — LIPASE, BLOOD: Lipase: 104 U/L (ref 73–393)

## 2012-10-08 LAB — MAGNESIUM: Magnesium: 0.7 mg/dL — ABNORMAL LOW

## 2012-10-12 ENCOUNTER — Emergency Department: Payer: Self-pay | Admitting: Unknown Physician Specialty

## 2012-10-14 ENCOUNTER — Encounter (HOSPITAL_COMMUNITY): Payer: Self-pay | Admitting: Emergency Medicine

## 2012-10-14 ENCOUNTER — Emergency Department (INDEPENDENT_AMBULATORY_CARE_PROVIDER_SITE_OTHER)
Admission: EM | Admit: 2012-10-14 | Discharge: 2012-10-14 | Disposition: A | Discharge status: Home / Self Care | Payer: Medicaid Other | Source: Home / Self Care | Attending: Family Medicine | Admitting: Family Medicine

## 2012-10-14 DIAGNOSIS — L089 Local infection of the skin and subcutaneous tissue, unspecified: Secondary | ICD-10-CM

## 2012-10-14 MED ORDER — CEPHALEXIN 500 MG PO CAPS: 500.0000 mg | ORAL_CAPSULE | Freq: Two times a day (BID) | ORAL | Status: DC

## 2012-10-14 NOTE — ED Notes (Signed)
Waiting discharge papers 

## 2012-10-14 NOTE — ED Notes (Addendum)
Pt c/o incision on right knee is draining yellow pus x 1 wk. Scar is light red some separation of scar, scab and yellow puss  Noted.  Knee is slightly swollen.  Recent knee surgery three weeks ago.   Pt denies fever or any other symptoms.

## 2012-10-15 NOTE — ED Provider Notes (Signed)
History     CSN: [id number]  Arrival date & time 10/14/12  1646   First MD Initiated Contact with Patient 10/14/12 1712      Chief Complaint  Patient presents with  . Knee Pain    recent surgery on right knee three weeks ago. pt is concerned that incision site is draining yellow pus.    (Consider location/radiation/quality/duration/timing/severity/associated sxs/prior treatment) HPI Comments: 31 y/o female with multiple co morbidities including cerebral palsy and recent right knee bursectomy 3 weeks ago (as per mother's report) by an orthopedic doctor in [place]. Here with mother concerned about an area of the surgical incision that has never completely closed. Mother reports that patient "picks" and scratches that area frequently. She is followed by her orthopedist and is ongoing physical therapy but after physical therapy yesterday noted area more irritated and had yellow drainage observed in dressing when she removed it for change today. Patient reports she has had pain in the knee is similar to prior days since surgery. No increased redness. Mother unsure if swollen compared with prior day. No active drainage. Patient is on wheelchair. Pain is not worse than prior days with putting weight on the affected knee. No fever or chills, appetite is good. Patient also had a recent fall few days ago when she hit her head again the fridge and has a bruise in left side of face, was seen in a hospital with normal CT scan. Denies injuring her knee during that fall. She call her orthopedist on call and was told to go to their walk in clinic but patient does not want to travel to [facility] today and decided to come here instead.  Patient's mother appears frustrated and responds to most questions with a " i don't know, i am tired and hungry" and states she "needs to go". Patient is able to respond appropriately to most questions despite difficulties with her speech.     Past Medical History  Diagnosis Date  .  Cerebral palsy   . Hypokalemia     now resolved  . 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  . Gitelman syndrome   . Chronic back pain     Past Surgical History  Procedure Date  . Pacemaker insertion   . Orthopedic surgery   . Abdominal hysterectomy     Family History  Problem Relation Age of Onset  . Diabetes Mother   . Hypertension Mother     History  Substance Use Topics  . Smoking status: Never Smoker   . Smokeless tobacco: Not on file  . Alcohol Use: No    OB History    Grav Para Term Preterm Abortions TAB SAB Ect Mult Living                  Review of Systems  Constitutional: Negative for fever, chills and appetite change.  Gastrointestinal: Negative for nausea, vomiting and abdominal pain.  Musculoskeletal:       As per HPI  Skin: Positive for wound.  Neurological: Negative for dizziness and headaches.    Allergies  Ciprofloxacin; Cymbalta; Gabapentin; Prozac; and Sulfa antibiotics  Home Medications   Current Outpatient Rx  Name  Route  Sig  Dispense  Refill  . LABETALOL HCL 100 MG PO TABS   Oral   Take 100 mg by mouth 2 (two) times daily.           Marland Kitchen LORATADINE 10 MG PO TABS   Oral  Take 1 tablet (10 mg total) by mouth daily.   7 tablet   0   . MAGNESIUM OXIDE 400 MG PO TABS   Oral   Take 400 mg by mouth 3 (three) times daily.           Marland Kitchen MAGNESIUM SULFATE IN D5W 10-5 MG/ML-% IV SOLN   Intravenous   Inject 2 g into the vein every 7 (seven) days.           . MELOXICAM 7.5 MG PO TABS   Oral   Take 1 tablet (7.5 mg total) by mouth daily.   10 tablet   0   . METHYLPHENIDATE HCL 20 MG PO TABS   Oral   Take 20 mg by mouth 3 (three) times daily.           Marland Kitchen POTASSIUM CHLORIDE 20 MEQ PO PACK   Oral   Take 20 mEq by mouth 2 (two) times daily.           Marland Kitchen PREGABALIN 50 MG PO CAPS   Oral   Take 50 mg by mouth 2 (two) times daily.           Marland Kitchen SPIRONOLACTONE 50 MG PO TABS   Oral   Take 50 mg  by mouth every other day.          . ALBUTEROL SULFATE (2.5 MG/3ML) 0.083% IN NEBU   Nebulization   Take 2.5 mg by nebulization every 6 (six) hours as needed.         . CEPHALEXIN 500 MG PO CAPS   Oral   Take 1 capsule (500 mg total) by mouth 2 (two) times daily.   20 capsule   0     BP 108/81  Pulse 84  Temp 98.2 F (36.8 C) (Oral)  Resp 20  SpO2 98%  LMP 06/09/2012  Physical Exam  Nursing note and vitals reviewed. Constitutional: She is oriented to person, place, and time.       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   HENT:  Head: Normocephalic.       There is a ecchymotic bruise in left temporal and supra periorbital area in healing stages.   Eyes: Conjunctivae normal and EOM are normal. Pupils are equal, round, and reactive to light. No scleral icterus.  Cardiovascular: Normal heart sounds.   Pulmonary/Chest: Breath sounds normal.  Musculoskeletal:       Right knee: hypotrophic. Some spasticity but able to flex and extend passively and passively fair amount. Impress no effusion or hemarthrosis. No generalized knee redness or obvious swelling. There is a vertical surgical wound about 6cm medial to the patella. There is no dyshesion. The superior part of the incision has dry well healed scar the inferior third has an irregular ulcerated area of about 2x2cm. Skin around is dry and peeling. There is dry pink granulation mixed with amber crusting in center. Appears superfiical. No obvious purulent exudates. There is no fluctuations around or below the wound or significant cellulitis around the wound above of what is expected in an area of recent surgery. No drainage from inside with applying pressure around wound. There is reported non focal tenderness around the wound with palpation. No hot to touch.  Neurological: She is alert and oriented to person, place, and time.    ED Course  Procedures (including critical care time)   Labs  Reviewed  LAB REPORT - SCANNED   No results found.   1.  Skin infection       MDM  Impress superficial ulcer at side of surgical incision likely staining dressing with transudates although no obvious purulent drainage.  No obvious symptoms or finding to suggest septic arthritis but possible superficial wound infection. Decided to cover with keflex and mother was instructed to follow up with her orthopedist group in 48 hours or go to the emergency department if fever, redness or swelling or any new or worsening symptoms despite following treatment.          Sharin Grave, MD 10/15/12 1906

## 2012-11-04 ENCOUNTER — Emergency Department: Payer: Self-pay | Admitting: Emergency Medicine

## 2012-11-04 LAB — COMPREHENSIVE METABOLIC PANEL
Alkaline Phosphatase: 92 U/L (ref 50–136)
Anion Gap: 5 — ABNORMAL LOW (ref 7–16)
Bilirubin,Total: 0.4 mg/dL (ref 0.2–1.0)
Calcium, Total: 9.2 mg/dL (ref 8.5–10.1)
Co2: 29 mmol/L (ref 21–32)
EGFR (Non-African Amer.): >60
Osmolality: 279 (ref 275–301)
Potassium: 3.4 mmol/L — ABNORMAL LOW (ref 3.5–5.1)
Sodium: 140 mmol/L (ref 136–145)
Total Protein: 8 g/dL (ref 6.4–8.2)

## 2012-11-04 LAB — CBC
HCT: 40.9 % (ref 35.0–47.0)
HGB: 14.3 g/dL (ref 12.0–16.0)
RBC: 4.68 10*6/uL (ref 3.80–5.20)
RDW: 13.1 % (ref 11.5–14.5)
WBC: 10.5 10*3/uL (ref 3.6–11.0)

## 2012-12-26 ENCOUNTER — Ambulatory Visit: Payer: Self-pay | Admitting: Internal Medicine

## 2013-01-09 ENCOUNTER — Emergency Department: Payer: Self-pay | Admitting: Emergency Medicine

## 2013-01-12 ENCOUNTER — Emergency Department: Payer: Self-pay | Admitting: Emergency Medicine

## 2013-01-12 LAB — CBC WITH DIFFERENTIAL/PLATELET
Eosinophil #: 0 10*3/uL (ref 0.0–0.7)
HCT: 41.2 % (ref 35.0–47.0)
Lymphocyte #: 0.8 10*3/uL — ABNORMAL LOW (ref 1.0–3.6)
Lymphocyte %: 8 %
MCH: 30 pg (ref 26.0–34.0)
Neutrophil #: 8.3 10*3/uL — ABNORMAL HIGH (ref 1.4–6.5)
Platelet: 126 10*3/uL — ABNORMAL LOW (ref 150–440)
RDW: 13.9 % (ref 11.5–14.5)
WBC: 9.6 10*3/uL (ref 3.6–11.0)

## 2013-01-12 LAB — COMPREHENSIVE METABOLIC PANEL
Albumin: 3.8 g/dL (ref 3.4–5.0)
Alkaline Phosphatase: 68 U/L (ref 50–136)
Anion Gap: 8 (ref 7–16)
BUN: 18 mg/dL (ref 7–18)
Calcium, Total: 7.8 mg/dL — ABNORMAL LOW (ref 8.5–10.1)
Chloride: 102 mmol/L (ref 98–107)
Co2: 29 mmol/L (ref 21–32)
EGFR (African American): >60
EGFR (Non-African Amer.): >60
Osmolality: 279 (ref 275–301)
Potassium: 3.2 mmol/L — ABNORMAL LOW (ref 3.5–5.1)
SGPT (ALT): 28 U/L (ref 12–78)
Total Protein: 7.3 g/dL (ref 6.4–8.2)

## 2013-01-12 LAB — MAGNESIUM: Magnesium: 0.6 mg/dL — ABNORMAL LOW

## 2013-01-14 ENCOUNTER — Emergency Department: Payer: Self-pay | Admitting: Emergency Medicine

## 2013-01-14 LAB — BASIC METABOLIC PANEL
Anion Gap: 10 (ref 7–16)
BUN: 12 mg/dL (ref 7–18)
Co2: 26 mmol/L (ref 21–32)
Creatinine: 0.66 mg/dL (ref 0.60–1.30)
EGFR (Non-African Amer.): >60
Glucose: 90 mg/dL (ref 65–99)
Potassium: 3.1 mmol/L — ABNORMAL LOW (ref 3.5–5.1)
Sodium: 138 mmol/L (ref 136–145)

## 2013-01-14 LAB — CBC WITH DIFFERENTIAL/PLATELET
Basophil %: 0.3 %
Eosinophil #: 0 10*3/uL (ref 0.0–0.7)
Eosinophil %: 0.1 %
HCT: 39.5 % (ref 35.0–47.0)
HGB: 13.8 g/dL (ref 12.0–16.0)
MCH: 30 pg (ref 26.0–34.0)
MCHC: 35 g/dL (ref 32.0–36.0)
Monocyte %: 7.6 %
Neutrophil #: 9.6 10*3/uL — ABNORMAL HIGH (ref 1.4–6.5)
Neutrophil %: 77.8 %
Platelet: 130 10*3/uL — ABNORMAL LOW (ref 150–440)
RBC: 4.61 10*6/uL (ref 3.80–5.20)
RDW: 13.9 % (ref 11.5–14.5)
WBC: 12.4 10*3/uL — ABNORMAL HIGH (ref 3.6–11.0)

## 2013-02-08 ENCOUNTER — Emergency Department: Payer: Self-pay | Admitting: Internal Medicine

## 2013-02-08 LAB — CK TOTAL AND CKMB (NOT AT ARMC)
CK, Total: 32 U/L (ref 21–215)
CK-MB: 0.7 ng/mL (ref 0.5–3.6)

## 2013-02-08 LAB — BASIC METABOLIC PANEL
Anion Gap: 4 — ABNORMAL LOW (ref 7–16)
BUN: 10 mg/dL (ref 7–18)
Chloride: 103 mmol/L (ref 98–107)
Co2: 33 mmol/L — ABNORMAL HIGH (ref 21–32)
EGFR (African American): >60
EGFR (Non-African Amer.): >60
Osmolality: 278 (ref 275–301)
Sodium: 140 mmol/L (ref 136–145)

## 2013-02-08 LAB — CBC
MCHC: 34.5 g/dL (ref 32.0–36.0)
MCV: 86 fL (ref 80–100)
Platelet: 164 10*3/uL (ref 150–440)
RBC: 4.56 10*6/uL (ref 3.80–5.20)

## 2013-02-08 LAB — MAGNESIUM: Magnesium: 0.7 mg/dL — ABNORMAL LOW

## 2013-02-26 ENCOUNTER — Emergency Department: Payer: Self-pay | Admitting: Emergency Medicine

## 2013-02-26 LAB — CBC WITH DIFFERENTIAL/PLATELET
Eosinophil #: 0 10*3/uL (ref 0.0–0.7)
HCT: 37.2 % (ref 35.0–47.0)
HGB: 12.8 g/dL (ref 12.0–16.0)
Lymphocyte %: 16.1 %
MCHC: 34.3 g/dL (ref 32.0–36.0)
MCV: 86 fL (ref 80–100)
Monocyte #: 0.6 x10 3/mm (ref 0.2–0.9)
Monocyte %: 4.6 %
Platelet: 170 10*3/uL (ref 150–440)

## 2013-02-26 LAB — COMPREHENSIVE METABOLIC PANEL
Anion Gap: 7 (ref 7–16)
Bilirubin,Total: 0.3 mg/dL (ref 0.2–1.0)
Calcium, Total: 8 mg/dL — ABNORMAL LOW (ref 8.5–10.1)
Co2: 29 mmol/L (ref 21–32)
EGFR (African American): >60
EGFR (Non-African Amer.): >60
Osmolality: 278 (ref 275–301)
Potassium: 3.1 mmol/L — ABNORMAL LOW (ref 3.5–5.1)
SGOT(AST): 34 U/L (ref 15–37)
SGPT (ALT): 37 U/L (ref 12–78)
Sodium: 139 mmol/L (ref 136–145)

## 2013-02-26 LAB — MAGNESIUM: Magnesium: 0.7 mg/dL — ABNORMAL LOW

## 2013-03-11 ENCOUNTER — Emergency Department: Payer: Self-pay | Admitting: Emergency Medicine

## 2013-03-11 LAB — CBC
HCT: 38 % (ref 35.0–47.0)
HGB: 13.2 g/dL (ref 12.0–16.0)
MCH: 29.9 pg (ref 26.0–34.0)
MCV: 86 fL (ref 80–100)
Platelet: 201 10*3/uL (ref 150–440)
RDW: 13.6 % (ref 11.5–14.5)
WBC: 10.7 10*3/uL (ref 3.6–11.0)

## 2013-03-11 LAB — COMPREHENSIVE METABOLIC PANEL
Albumin: 3.8 g/dL (ref 3.4–5.0)
Alkaline Phosphatase: 78 U/L (ref 50–136)
Anion Gap: 9 (ref 7–16)
BUN: 12 mg/dL (ref 7–18)
Bilirubin,Total: 0.4 mg/dL (ref 0.2–1.0)
Calcium, Total: 8.3 mg/dL — ABNORMAL LOW (ref 8.5–10.1)
Creatinine: 0.83 mg/dL (ref 0.60–1.30)
EGFR (Non-African Amer.): >60
Glucose: 89 mg/dL (ref 65–99)
Osmolality: 280 (ref 275–301)
SGPT (ALT): 19 U/L (ref 12–78)
Sodium: 141 mmol/L (ref 136–145)
Total Protein: 7.8 g/dL (ref 6.4–8.2)

## 2013-03-11 LAB — MAGNESIUM: Magnesium: 0.6 mg/dL — ABNORMAL LOW

## 2013-03-31 ENCOUNTER — Emergency Department: Payer: Self-pay | Admitting: Emergency Medicine

## 2013-03-31 LAB — CBC
HGB: 13.2 g/dL (ref 12.0–16.0)
MCH: 29.6 pg (ref 26.0–34.0)
Platelet: 191 10*3/uL (ref 150–440)
RBC: 4.45 10*6/uL (ref 3.80–5.20)
RDW: 13.4 % (ref 11.5–14.5)

## 2013-03-31 LAB — COMPREHENSIVE METABOLIC PANEL
Albumin: 3.4 g/dL (ref 3.4–5.0)
Alkaline Phosphatase: 79 U/L (ref 50–136)
Anion Gap: 7 (ref 7–16)
Chloride: 105 mmol/L (ref 98–107)
Co2: 28 mmol/L (ref 21–32)
Creatinine: 0.67 mg/dL (ref 0.60–1.30)
EGFR (African American): >60
Glucose: 88 mg/dL (ref 65–99)
Osmolality: 278 (ref 275–301)
Potassium: 3.1 mmol/L — ABNORMAL LOW (ref 3.5–5.1)
SGOT(AST): 17 U/L (ref 15–37)
SGPT (ALT): 16 U/L (ref 12–78)

## 2013-03-31 LAB — URINALYSIS, COMPLETE
Glucose,UR: NEGATIVE mg/dL (ref 0–75)
Ph: 5 (ref 4.5–8.0)
Protein: 30
Squamous Epithelial: 5
WBC UR: 7 /HPF (ref 0–5)

## 2013-03-31 LAB — MAGNESIUM: Magnesium: 0.4 mg/dL — ABNORMAL LOW

## 2013-04-18 ENCOUNTER — Emergency Department: Payer: Self-pay

## 2013-04-18 LAB — COMPREHENSIVE METABOLIC PANEL WITH GFR
Albumin: 3.5 g/dL
Alkaline Phosphatase: 86 U/L
Anion Gap: 7
BUN: 11 mg/dL
Bilirubin,Total: 0.4 mg/dL
Calcium, Total: 8.6 mg/dL
Chloride: 102 mmol/L
Co2: 29 mmol/L
Creatinine: 0.79 mg/dL
EGFR (African American): >60
EGFR (Non-African Amer.): >60
Glucose: 100 mg/dL — ABNORMAL HIGH
Osmolality: 275
Potassium: 2.9 mmol/L — ABNORMAL LOW
SGOT(AST): 14 U/L — ABNORMAL LOW
SGPT (ALT): 16 U/L
Sodium: 138 mmol/L
Total Protein: 7.6 g/dL

## 2013-04-18 LAB — CBC
HCT: 36.4 % (ref 35.0–47.0)
MCH: 29.2 pg (ref 26.0–34.0)
MCHC: 35 g/dL (ref 32.0–36.0)
MCV: 83 fL (ref 80–100)
Platelet: 195 10*3/uL (ref 150–440)
WBC: 12.8 10*3/uL — ABNORMAL HIGH (ref 3.6–11.0)

## 2013-04-18 LAB — URINALYSIS, COMPLETE
Bilirubin,UR: NEGATIVE
Glucose,UR: NEGATIVE mg/dL
Nitrite: NEGATIVE
Ph: 5
Protein: NEGATIVE
RBC,UR: 3 /HPF
Specific Gravity: 1.021
Squamous Epithelial: 5
WBC UR: 5 /HPF

## 2013-05-08 ENCOUNTER — Emergency Department: Payer: Self-pay | Admitting: Emergency Medicine

## 2013-05-08 LAB — CBC
HCT: 38.7 % (ref 35.0–47.0)
MCH: 29.3 pg (ref 26.0–34.0)
MCHC: 34.6 g/dL (ref 32.0–36.0)
MCV: 85 fL (ref 80–100)
RDW: 12.8 % (ref 11.5–14.5)
WBC: 9.2 10*3/uL (ref 3.6–11.0)

## 2013-05-08 LAB — BASIC METABOLIC PANEL
BUN: 15 mg/dL (ref 7–18)
EGFR (African American): >60
EGFR (Non-African Amer.): >60
Glucose: 85 mg/dL (ref 65–99)
Osmolality: 279 (ref 275–301)
Sodium: 140 mmol/L (ref 136–145)

## 2013-05-08 LAB — MAGNESIUM: Magnesium: 0.7 mg/dL — ABNORMAL LOW

## 2013-05-23 ENCOUNTER — Emergency Department: Payer: Self-pay | Admitting: Emergency Medicine

## 2013-05-23 LAB — URINALYSIS, COMPLETE
Bilirubin,UR: NEGATIVE
Blood: NEGATIVE
Ketone: NEGATIVE
Nitrite: NEGATIVE
Ph: 5 (ref 4.5–8.0)
Protein: NEGATIVE
RBC,UR: 2 /HPF (ref 0–5)

## 2013-05-23 LAB — COMPREHENSIVE METABOLIC PANEL
Alkaline Phosphatase: 84 U/L (ref 50–136)
Anion Gap: 5 — ABNORMAL LOW (ref 7–16)
Calcium, Total: 9.1 mg/dL (ref 8.5–10.1)
Chloride: 105 mmol/L (ref 98–107)
Co2: 27 mmol/L (ref 21–32)
EGFR (Non-African Amer.): >60
Glucose: 84 mg/dL (ref 65–99)
Osmolality: 274 (ref 275–301)
Potassium: 3.3 mmol/L — ABNORMAL LOW (ref 3.5–5.1)
SGOT(AST): 20 U/L (ref 15–37)
SGPT (ALT): 21 U/L (ref 12–78)
Total Protein: 7.2 g/dL (ref 6.4–8.2)

## 2013-05-23 LAB — CBC
HCT: 37.4 % (ref 35.0–47.0)
MCH: 29.2 pg (ref 26.0–34.0)
Platelet: 173 10*3/uL (ref 150–440)
RBC: 4.44 10*6/uL (ref 3.80–5.20)
RDW: 12.6 % (ref 11.5–14.5)
WBC: 11.2 10*3/uL — ABNORMAL HIGH (ref 3.6–11.0)

## 2013-05-23 LAB — MAGNESIUM: Magnesium: 0.6 mg/dL — ABNORMAL LOW

## 2013-05-26 ENCOUNTER — Emergency Department: Payer: Self-pay | Admitting: Emergency Medicine

## 2013-05-26 LAB — BASIC METABOLIC PANEL WITH GFR
Anion Gap: 5 — ABNORMAL LOW
BUN: 12 mg/dL
Calcium, Total: 9.2 mg/dL
Chloride: 105 mmol/L
Co2: 30 mmol/L
Creatinine: 0.76 mg/dL
EGFR (African American): >60
EGFR (Non-African Amer.): >60
Glucose: 87 mg/dL
Osmolality: 279
Potassium: 3.9 mmol/L
Sodium: 140 mmol/L

## 2013-05-26 LAB — MAGNESIUM: Magnesium: 0.6 mg/dL — ABNORMAL LOW

## 2013-05-31 DIAGNOSIS — R001 Bradycardia, unspecified: Secondary | ICD-10-CM | POA: Insufficient documentation

## 2013-06-01 ENCOUNTER — Emergency Department: Payer: Self-pay | Admitting: Emergency Medicine

## 2013-06-01 LAB — BASIC METABOLIC PANEL WITH GFR
Anion Gap: 6 — ABNORMAL LOW
BUN: 14 mg/dL
Calcium, Total: 9.2 mg/dL
Chloride: 105 mmol/L
Co2: 30 mmol/L
Creatinine: 0.88 mg/dL
EGFR (African American): >60
EGFR (Non-African Amer.): >60
Glucose: 107 mg/dL — ABNORMAL HIGH
Osmolality: 282
Potassium: 3.4 mmol/L — ABNORMAL LOW
Sodium: 141 mmol/L

## 2013-06-01 LAB — HEPATIC FUNCTION PANEL A (ARMC)
Albumin: 4.1 g/dL (ref 3.4–5.0)
SGOT(AST): 26 U/L (ref 15–37)
SGPT (ALT): 23 U/L (ref 12–78)
Total Protein: 7.7 g/dL (ref 6.4–8.2)

## 2013-06-01 LAB — CBC
MCH: 29.4 pg (ref 26.0–34.0)
MCHC: 34.8 g/dL (ref 32.0–36.0)
MCV: 85 fL (ref 80–100)
Platelet: 197 10*3/uL (ref 150–440)
RDW: 12.7 % (ref 11.5–14.5)
WBC: 9.3 10*3/uL (ref 3.6–11.0)

## 2013-06-01 LAB — PROTIME-INR
INR: 1
Prothrombin Time: 13.2 s

## 2013-06-01 LAB — LIPASE, BLOOD: Lipase: 127 U/L

## 2013-06-07 ENCOUNTER — Emergency Department: Payer: Self-pay | Admitting: Emergency Medicine

## 2013-06-07 LAB — COMPREHENSIVE METABOLIC PANEL
Albumin: 3.7 g/dL (ref 3.4–5.0)
Alkaline Phosphatase: 100 U/L (ref 50–136)
Anion Gap: 7 (ref 7–16)
BUN: 15 mg/dL (ref 7–18)
Calcium, Total: 9.3 mg/dL (ref 8.5–10.1)
Chloride: 103 mmol/L (ref 98–107)
Co2: 30 mmol/L (ref 21–32)
Creatinine: 0.75 mg/dL (ref 0.60–1.30)
EGFR (African American): >60
Glucose: 98 mg/dL (ref 65–99)
Potassium: 3.8 mmol/L (ref 3.5–5.1)
SGPT (ALT): 31 U/L (ref 12–78)
Total Protein: 7.1 g/dL (ref 6.4–8.2)

## 2013-06-07 LAB — CBC
HCT: 37.9 % (ref 35.0–47.0)
HGB: 13.2 g/dL (ref 12.0–16.0)
MCHC: 34.8 g/dL (ref 32.0–36.0)
Platelet: 155 10*3/uL (ref 150–440)

## 2013-06-12 ENCOUNTER — Emergency Department: Payer: Self-pay | Admitting: Emergency Medicine

## 2013-06-14 ENCOUNTER — Emergency Department: Payer: Self-pay | Admitting: Emergency Medicine

## 2013-06-14 LAB — BASIC METABOLIC PANEL
Anion Gap: 7 (ref 7–16)
BUN: 17 mg/dL (ref 7–18)
Calcium, Total: 9.2 mg/dL (ref 8.5–10.1)
Co2: 28 mmol/L (ref 21–32)
Creatinine: 0.95 mg/dL (ref 0.60–1.30)
EGFR (African American): >60
EGFR (Non-African Amer.): >60
Glucose: 91 mg/dL (ref 65–99)
Potassium: 3.9 mmol/L (ref 3.5–5.1)
Sodium: 142 mmol/L (ref 136–145)

## 2013-06-14 LAB — MAGNESIUM: Magnesium: 0.7 mg/dL — ABNORMAL LOW

## 2013-06-15 ENCOUNTER — Emergency Department: Payer: Self-pay | Admitting: Emergency Medicine

## 2013-06-15 LAB — CBC
HCT: 37.1 % (ref 35.0–47.0)
HGB: 12.9 g/dL (ref 12.0–16.0)
MCH: 28.8 pg (ref 26.0–34.0)
MCV: 83 fL (ref 80–100)
Platelet: 141 10*3/uL — ABNORMAL LOW (ref 150–440)
RBC: 4.47 10*6/uL (ref 3.80–5.20)
RDW: 12.4 % (ref 11.5–14.5)

## 2013-06-15 LAB — COMPREHENSIVE METABOLIC PANEL
Albumin: 3.9 g/dL (ref 3.4–5.0)
Alkaline Phosphatase: 105 U/L (ref 50–136)
Anion Gap: 9 (ref 7–16)
Co2: 25 mmol/L (ref 21–32)
Creatinine: 0.89 mg/dL (ref 0.60–1.30)
EGFR (African American): >60
Glucose: 105 mg/dL — ABNORMAL HIGH (ref 65–99)
Osmolality: 277 (ref 275–301)
Potassium: 3.4 mmol/L — ABNORMAL LOW (ref 3.5–5.1)
SGPT (ALT): 50 U/L (ref 12–78)
Sodium: 138 mmol/L (ref 136–145)

## 2013-06-16 ENCOUNTER — Emergency Department: Payer: Self-pay | Admitting: Emergency Medicine

## 2013-06-16 LAB — BASIC METABOLIC PANEL
Anion Gap: 8 (ref 7–16)
BUN: 14 mg/dL (ref 7–18)
Chloride: 105 mmol/L (ref 98–107)
Creatinine: 0.83 mg/dL (ref 0.60–1.30)
EGFR (African American): >60
EGFR (Non-African Amer.): >60
Glucose: 98 mg/dL (ref 65–99)
Osmolality: 282 (ref 275–301)
Sodium: 141 mmol/L (ref 136–145)

## 2013-06-16 LAB — URINALYSIS, COMPLETE
Bilirubin,UR: NEGATIVE
Glucose,UR: NEGATIVE mg/dL (ref 0–75)
Hyaline Cast: 1
Ketone: NEGATIVE
Ph: 5 (ref 4.5–8.0)
RBC,UR: 2 /HPF (ref 0–5)
Specific Gravity: 1.019 (ref 1.003–1.030)

## 2013-06-16 LAB — CBC
HGB: 13.5 g/dL (ref 12.0–16.0)
MCH: 29.1 pg (ref 26.0–34.0)
MCHC: 34.5 g/dL (ref 32.0–36.0)
MCV: 84 fL (ref 80–100)
RBC: 4.63 10*6/uL (ref 3.80–5.20)
RDW: 12.6 % (ref 11.5–14.5)
WBC: 8.6 10*3/uL (ref 3.6–11.0)

## 2013-06-16 LAB — MAGNESIUM: Magnesium: 0.9 mg/dL — ABNORMAL LOW

## 2013-06-19 ENCOUNTER — Emergency Department: Payer: Self-pay | Admitting: Emergency Medicine

## 2013-06-19 LAB — COMPREHENSIVE METABOLIC PANEL
Albumin: 4.1 g/dL (ref 3.4–5.0)
Anion Gap: 7 (ref 7–16)
Co2: 27 mmol/L (ref 21–32)
EGFR (African American): >60
EGFR (Non-African Amer.): >60
Glucose: 97 mg/dL (ref 65–99)
Potassium: 3.6 mmol/L (ref 3.5–5.1)
SGOT(AST): 34 U/L (ref 15–37)
Sodium: 143 mmol/L (ref 136–145)

## 2013-06-19 LAB — CBC WITH DIFFERENTIAL/PLATELET
Basophil %: 0.7 %
Eosinophil #: 0 10*3/uL (ref 0.0–0.7)
HCT: 40.3 % (ref 35.0–47.0)
HGB: 13.8 g/dL (ref 12.0–16.0)
Lymphocyte #: 2.4 10*3/uL (ref 1.0–3.6)
MCH: 28.9 pg (ref 26.0–34.0)
MCHC: 34.4 g/dL (ref 32.0–36.0)
Monocyte #: 0.5 x10 3/mm (ref 0.2–0.9)
Neutrophil #: 7.3 10*3/uL — ABNORMAL HIGH (ref 1.4–6.5)
RBC: 4.79 10*6/uL (ref 3.80–5.20)
RDW: 12.7 % (ref 11.5–14.5)
WBC: 10.3 10*3/uL (ref 3.6–11.0)

## 2013-06-19 LAB — MAGNESIUM: Magnesium: 0.8 mg/dL — ABNORMAL LOW

## 2013-06-23 ENCOUNTER — Emergency Department: Payer: Self-pay | Admitting: Emergency Medicine

## 2013-06-29 ENCOUNTER — Emergency Department: Payer: Self-pay | Admitting: Emergency Medicine

## 2013-06-29 LAB — COMPREHENSIVE METABOLIC PANEL
BUN: 16 mg/dL (ref 7–18)
Calcium, Total: 9.6 mg/dL (ref 8.5–10.1)
Creatinine: 0.82 mg/dL (ref 0.60–1.30)
EGFR (African American): >60
EGFR (Non-African Amer.): >60
Glucose: 97 mg/dL (ref 65–99)
Potassium: 3.6 mmol/L (ref 3.5–5.1)
SGPT (ALT): 78 U/L (ref 12–78)
Sodium: 139 mmol/L (ref 136–145)
Total Protein: 7.3 g/dL (ref 6.4–8.2)

## 2013-07-05 ENCOUNTER — Emergency Department: Payer: Self-pay | Admitting: Internal Medicine

## 2013-07-05 LAB — COMPREHENSIVE METABOLIC PANEL
Albumin: 4.2 g/dL (ref 3.4–5.0)
Bilirubin,Total: 0.4 mg/dL (ref 0.2–1.0)
Calcium, Total: 9.7 mg/dL (ref 8.5–10.1)
Chloride: 104 mmol/L (ref 98–107)
Creatinine: 0.81 mg/dL (ref 0.60–1.30)
EGFR (Non-African Amer.): >60
Glucose: 103 mg/dL — ABNORMAL HIGH (ref 65–99)
Potassium: 3.7 mmol/L (ref 3.5–5.1)
SGOT(AST): 84 U/L — ABNORMAL HIGH (ref 15–37)
Sodium: 140 mmol/L (ref 136–145)
Total Protein: 7.6 g/dL (ref 6.4–8.2)

## 2013-07-05 LAB — CBC
HGB: 13.5 g/dL (ref 12.0–16.0)
MCH: 28.9 pg (ref 26.0–34.0)
MCHC: 34.5 g/dL (ref 32.0–36.0)
Platelet: 163 10*3/uL (ref 150–440)
WBC: 7.3 10*3/uL (ref 3.6–11.0)

## 2013-07-05 LAB — MAGNESIUM: Magnesium: 0.8 mg/dL — ABNORMAL LOW

## 2013-07-07 ENCOUNTER — Emergency Department: Payer: Self-pay | Admitting: Internal Medicine

## 2013-07-07 LAB — CBC
HCT: 39.5 % (ref 35.0–47.0)
HGB: 13.4 g/dL (ref 12.0–16.0)
MCH: 28.3 pg (ref 26.0–34.0)
MCV: 83 fL (ref 80–100)
Platelet: 177 10*3/uL (ref 150–440)
RBC: 4.75 10*6/uL (ref 3.80–5.20)
RDW: 12.8 % (ref 11.5–14.5)

## 2013-07-07 LAB — COMPREHENSIVE METABOLIC PANEL
Albumin: 4.2 g/dL (ref 3.4–5.0)
Alkaline Phosphatase: 114 U/L (ref 50–136)
Anion Gap: 8 (ref 7–16)
BUN: 19 mg/dL — ABNORMAL HIGH (ref 7–18)
Bilirubin,Total: 0.5 mg/dL (ref 0.2–1.0)
EGFR (African American): >60
Glucose: 95 mg/dL (ref 65–99)
SGOT(AST): 62 U/L — ABNORMAL HIGH (ref 15–37)
Sodium: 140 mmol/L (ref 136–145)
Total Protein: 7.6 g/dL (ref 6.4–8.2)

## 2013-07-07 LAB — MAGNESIUM: Magnesium: 0.8 mg/dL — ABNORMAL LOW

## 2013-07-07 LAB — URINALYSIS, COMPLETE
Bacteria: NONE SEEN
Bilirubin,UR: NEGATIVE
Blood: NEGATIVE
Nitrite: NEGATIVE
Ph: 5 (ref 4.5–8.0)
RBC,UR: 1 /HPF (ref 0–5)
Specific Gravity: 1.019 (ref 1.003–1.030)
Squamous Epithelial: 1
WBC UR: 10 /HPF (ref 0–5)

## 2013-07-14 ENCOUNTER — Emergency Department: Payer: Self-pay | Admitting: Emergency Medicine

## 2013-07-14 LAB — BASIC METABOLIC PANEL
Calcium, Total: 9.6 mg/dL (ref 8.5–10.1)
Chloride: 103 mmol/L (ref 98–107)
EGFR (Non-African Amer.): >60
Glucose: 98 mg/dL (ref 65–99)
Osmolality: 280 (ref 275–301)

## 2013-07-14 LAB — MAGNESIUM: Magnesium: 0.8 mg/dL — ABNORMAL LOW

## 2013-07-21 ENCOUNTER — Emergency Department (HOSPITAL_COMMUNITY)
Admission: EM | Admit: 2013-07-21 | Discharge: 2013-07-21 | Disposition: A | Discharge status: Home / Self Care | Payer: Medicaid Other | Attending: Emergency Medicine | Admitting: Emergency Medicine

## 2013-07-21 ENCOUNTER — Encounter (HOSPITAL_COMMUNITY): Payer: Self-pay | Admitting: Emergency Medicine

## 2013-07-21 DIAGNOSIS — Z862 Personal history of diseases of the blood and blood-forming organs and certain disorders involving the immune mechanism: Secondary | ICD-10-CM | POA: Insufficient documentation

## 2013-07-21 DIAGNOSIS — Z950 Presence of cardiac pacemaker: Secondary | ICD-10-CM | POA: Insufficient documentation

## 2013-07-21 DIAGNOSIS — R000 Tachycardia, unspecified: Secondary | ICD-10-CM | POA: Insufficient documentation

## 2013-07-21 DIAGNOSIS — I951 Orthostatic hypotension: Secondary | ICD-10-CM | POA: Insufficient documentation

## 2013-07-21 DIAGNOSIS — G8929 Other chronic pain: Secondary | ICD-10-CM | POA: Insufficient documentation

## 2013-07-21 DIAGNOSIS — Z8669 Personal history of other diseases of the nervous system and sense organs: Secondary | ICD-10-CM | POA: Insufficient documentation

## 2013-07-21 DIAGNOSIS — Z8639 Personal history of other endocrine, nutritional and metabolic disease: Secondary | ICD-10-CM | POA: Insufficient documentation

## 2013-07-21 DIAGNOSIS — Z79899 Other long term (current) drug therapy: Secondary | ICD-10-CM | POA: Insufficient documentation

## 2013-07-21 MED ORDER — MAGNESIUM SULFATE 40 MG/ML IJ SOLN
2.0000 g | Freq: Once | INTRAMUSCULAR | Status: AC
  Administered 2013-07-21: 2 g via INTRAVENOUS
  Filled 2013-07-21 (×2): qty 50

## 2013-07-21 NOTE — ED Provider Notes (Signed)
CSN: [id number]     Arrival date & time 07/21/13  1520 History     First MD Initiated Contact with Patient 07/21/13 1631     Chief Complaint  Patient presents with  . magnesium level check    (Consider location/radiation/quality/duration/timing/severity/associated sxs/prior Treatment) HPI Comments: Pt comes in with cc of low magnesium. 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 She was getting regular infusions, but was allegedly discharged from the site due to some behavior issue. Pt has no chest pain, palpitations, diarrhea, abd pain. She has no numbness, tingling.   The history is provided by the patient.    Past Medical History  Diagnosis Date  . Cerebral palsy   . Hypokalemia     now resolved  . 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  . Gitelman syndrome   . Chronic back pain    Past Surgical History  Procedure Laterality Date  . Pacemaker insertion    . Orthopedic surgery    . Abdominal hysterectomy     Family History  Problem Relation Age of Onset  . Diabetes Mother   . Hypertension Mother    History  Substance Use Topics  . Smoking status: Never Smoker   . Smokeless tobacco: Not on file  . Alcohol Use: No   OB History   Grav Para Term Preterm Abortions TAB SAB Ect Mult Living                 Review of Systems  Constitutional: Negative for activity change.  HENT: Negative for neck pain.   Respiratory: Negative for shortness of breath.   Cardiovascular: Negative for chest pain.  Gastrointestinal: Negative for nausea, vomiting and abdominal pain.  Genitourinary: Negative for dysuria.  Neurological: Negative for headaches.    Allergies  Ciprofloxacin; Cymbalta; Gabapentin; Prozac; and Sulfa antibiotics  Home Medications   Current Outpatient Rx  Name  Route  Sig  Dispense  Refill  . magnesium oxide (MAG-OX) 400 MG tablet   Oral   Take 400 mg by mouth 3 (three) times  daily.           . magnesium sulfate 10-5 MG/ML-% INFUSION   Intravenous   Inject 2 g into the vein every 7 (seven) days.           . methylphenidate (RITALIN) 20 MG tablet   Oral   Take 20 mg by mouth 3 (three) times daily.           . pregabalin (LYRICA) 50 MG capsule   Oral   Take 50 mg by mouth 2 (two) times daily.            BP 122/82  Pulse 89  Temp(Src) 99 F (37.2 C) (Oral)  Resp 16  SpO2 100%  LMP 06/09/2012 Physical Exam  Nursing note and vitals reviewed. Constitutional: She is oriented to person, place, and time. She appears well-developed.  HENT:  Head: Normocephalic.  Eyes: Conjunctivae are normal.  Neck: Normal range of motion. Neck supple.  Cardiovascular: Normal rate and regular rhythm.   Pulmonary/Chest: Effort normal.  Neurological: She is alert and oriented to person, place, and time.    ED Course   Procedures (including critical care time)  Labs Reviewed  MAGNESIUM - Abnormal; Notable for the following:    Magnesium 1.1 (*)    All other components within normal limits   No results found. 1. Hypomagnesemia     MDM  Pt has low magnesium hx, and labs today confirm it. Asymptomatic.  She has a Visual merchandiser in place, and the EKG is :   Date: 07/21/2013  Rate: 85  Rhythm: paced  QRS Axis: normal  Intervals: normal  ST/T Wave abnormalities: nonspecific ST/T changes  Conduction Disutrbances:none  Narrative Interpretation:   Old EKG Reviewed: none available  We will transfuse her 2 grams. She will continue seeing her PCP. They are in process of finding a new transfusion place.    Derwood Kaplan, MD 07/21/13 [phone]

## 2013-07-21 NOTE — ED Notes (Signed)
Pt's mother states that she only needs her magnesium level checked. She will sometimes have to get infusions due to her medical condition.

## 2013-07-27 ENCOUNTER — Emergency Department: Payer: Self-pay | Admitting: Emergency Medicine

## 2013-07-27 LAB — BASIC METABOLIC PANEL
Anion Gap: 6 — ABNORMAL LOW (ref 7–16)
BUN: 16 mg/dL (ref 7–18)
Co2: 29 mmol/L (ref 21–32)
Creatinine: 0.81 mg/dL (ref 0.60–1.30)
EGFR (Non-African Amer.): >60
Glucose: 102 mg/dL — ABNORMAL HIGH (ref 65–99)
Potassium: 3.3 mmol/L — ABNORMAL LOW (ref 3.5–5.1)
Sodium: 139 mmol/L (ref 136–145)

## 2013-07-27 LAB — MAGNESIUM: Magnesium: 0.7 mg/dL — ABNORMAL LOW

## 2013-08-05 ENCOUNTER — Emergency Department: Payer: Self-pay | Admitting: Emergency Medicine

## 2013-08-05 LAB — BASIC METABOLIC PANEL
Calcium, Total: 9.4 mg/dL (ref 8.5–10.1)
Chloride: 106 mmol/L (ref 98–107)
Creatinine: 0.76 mg/dL (ref 0.60–1.30)
EGFR (African American): >60
Glucose: 98 mg/dL (ref 65–99)
Potassium: 3.7 mmol/L (ref 3.5–5.1)

## 2013-08-05 LAB — MAGNESIUM: Magnesium: 1 mg/dL — ABNORMAL LOW

## 2013-08-09 ENCOUNTER — Emergency Department: Payer: Self-pay | Admitting: Emergency Medicine

## 2013-08-09 LAB — COMPREHENSIVE METABOLIC PANEL
Albumin: 3.9 g/dL (ref 3.4–5.0)
Anion Gap: 8 (ref 7–16)
Calcium, Total: 9.7 mg/dL (ref 8.5–10.1)
Chloride: 104 mmol/L (ref 98–107)
Co2: 27 mmol/L (ref 21–32)
EGFR (African American): >60
EGFR (Non-African Amer.): >60
Potassium: 4 mmol/L (ref 3.5–5.1)
SGOT(AST): 41 U/L — ABNORMAL HIGH (ref 15–37)
SGPT (ALT): 51 U/L (ref 12–78)

## 2013-08-09 LAB — CBC WITH DIFFERENTIAL/PLATELET
Basophil #: 0 10*3/uL (ref 0.0–0.1)
Eosinophil %: 0.3 %
HCT: 36.6 % (ref 35.0–47.0)
HGB: 13 g/dL (ref 12.0–16.0)
MCH: 29.4 pg (ref 26.0–34.0)
MCHC: 35.6 g/dL (ref 32.0–36.0)
MCV: 83 fL (ref 80–100)
Monocyte %: 5.4 %
Neutrophil %: 70.4 %
RBC: 4.43 10*6/uL (ref 3.80–5.20)
RDW: 12.8 % (ref 11.5–14.5)

## 2013-08-09 LAB — MAGNESIUM: Magnesium: 0.6 mg/dL — ABNORMAL LOW

## 2013-08-15 ENCOUNTER — Emergency Department: Payer: Self-pay | Admitting: Emergency Medicine

## 2013-08-15 LAB — CBC
MCH: 28.9 pg (ref 26.0–34.0)
MCHC: 35.2 g/dL (ref 32.0–36.0)
MCV: 82 fL (ref 80–100)
Platelet: 187 10*3/uL (ref 150–440)
RDW: 12.6 % (ref 11.5–14.5)
WBC: 6.3 10*3/uL (ref 3.6–11.0)

## 2013-08-15 LAB — COMPREHENSIVE METABOLIC PANEL
Albumin: 4 g/dL (ref 3.4–5.0)
Alkaline Phosphatase: 103 U/L (ref 50–136)
Anion Gap: 6 — ABNORMAL LOW (ref 7–16)
BUN: 17 mg/dL (ref 7–18)
Co2: 29 mmol/L (ref 21–32)
Creatinine: 0.76 mg/dL (ref 0.60–1.30)
EGFR (African American): >60
EGFR (Non-African Amer.): >60
Glucose: 104 mg/dL — ABNORMAL HIGH (ref 65–99)
Osmolality: 279 (ref 275–301)
Potassium: 3.6 mmol/L (ref 3.5–5.1)
SGOT(AST): 32 U/L (ref 15–37)
SGPT (ALT): 40 U/L (ref 12–78)

## 2013-08-15 LAB — URINALYSIS, COMPLETE
Glucose,UR: NEGATIVE mg/dL (ref 0–75)
Ketone: NEGATIVE
Nitrite: NEGATIVE
Protein: NEGATIVE
RBC,UR: 4 /HPF (ref 0–5)
Specific Gravity: 1.017 (ref 1.003–1.030)

## 2013-08-15 LAB — MAGNESIUM: Magnesium: 0.9 mg/dL — ABNORMAL LOW

## 2013-08-17 ENCOUNTER — Emergency Department: Payer: Self-pay | Admitting: Emergency Medicine

## 2013-08-17 LAB — COMPREHENSIVE METABOLIC PANEL
Anion Gap: 6 — ABNORMAL LOW (ref 7–16)
BUN: 17 mg/dL (ref 7–18)
Chloride: 105 mmol/L (ref 98–107)
EGFR (Non-African Amer.): >60
Glucose: 100 mg/dL — ABNORMAL HIGH (ref 65–99)
Potassium: 3.3 mmol/L — ABNORMAL LOW (ref 3.5–5.1)
SGOT(AST): 40 U/L — ABNORMAL HIGH (ref 15–37)
Total Protein: 7.5 g/dL (ref 6.4–8.2)

## 2013-08-17 LAB — MAGNESIUM: Magnesium: 0.8 mg/dL — ABNORMAL LOW

## 2013-08-18 ENCOUNTER — Emergency Department: Payer: Self-pay | Admitting: Internal Medicine

## 2013-08-18 LAB — URINALYSIS, COMPLETE
Glucose,UR: NEGATIVE mg/dL (ref 0–75)
Ketone: NEGATIVE
Nitrite: NEGATIVE
Ph: 6 (ref 4.5–8.0)
Protein: NEGATIVE
RBC,UR: <1 /HPF (ref 0–5)
Specific Gravity: 1.01 (ref 1.003–1.030)
Squamous Epithelial: <1
WBC UR: 12 /HPF (ref 0–5)

## 2013-08-18 LAB — DRUG SCREEN, URINE
Amphetamines, Ur Screen: NEGATIVE (ref ?–1000)
Barbiturates, Ur Screen: NEGATIVE (ref ?–200)
Benzodiazepine, Ur Scrn: NEGATIVE (ref ?–200)
MDMA (Ecstasy)Ur Screen: NEGATIVE (ref ?–500)
Methadone, Ur Screen: NEGATIVE (ref ?–300)
Opiate, Ur Screen: NEGATIVE (ref ?–300)
Phencyclidine (PCP) Ur S: NEGATIVE (ref ?–25)
Tricyclic, Ur Screen: NEGATIVE (ref ?–1000)

## 2013-08-18 LAB — COMPREHENSIVE METABOLIC PANEL
Anion Gap: 7 (ref 7–16)
BUN: 14 mg/dL (ref 7–18)
Bilirubin,Total: 0.3 mg/dL (ref 0.2–1.0)
Chloride: 108 mmol/L — ABNORMAL HIGH (ref 98–107)
Co2: 25 mmol/L (ref 21–32)
Creatinine: 0.73 mg/dL (ref 0.60–1.30)
EGFR (African American): >60
EGFR (Non-African Amer.): >60
Potassium: 3.3 mmol/L — ABNORMAL LOW (ref 3.5–5.1)
Sodium: 140 mmol/L (ref 136–145)

## 2013-08-18 LAB — MAGNESIUM: Magnesium: 0.8 mg/dL — ABNORMAL LOW

## 2013-08-18 LAB — CBC
HCT: 33 % — ABNORMAL LOW (ref 35.0–47.0)
HGB: 11.8 g/dL — ABNORMAL LOW (ref 12.0–16.0)
MCH: 29.3 pg (ref 26.0–34.0)
MCHC: 35.7 g/dL (ref 32.0–36.0)
MCV: 82 fL (ref 80–100)
RBC: 4.02 10*6/uL (ref 3.80–5.20)
WBC: 7.2 10*3/uL (ref 3.6–11.0)

## 2013-08-18 LAB — TROPONIN I: Troponin-I: <0.02 ng/mL

## 2013-08-20 ENCOUNTER — Encounter (HOSPITAL_COMMUNITY): Payer: Self-pay | Admitting: Emergency Medicine

## 2013-08-20 ENCOUNTER — Emergency Department (HOSPITAL_COMMUNITY): Payer: Medicaid Other

## 2013-08-20 ENCOUNTER — Emergency Department (HOSPITAL_COMMUNITY)
Admission: EM | Admit: 2013-08-20 | Discharge: 2013-08-20 | Disposition: A | Discharge status: Home / Self Care | Payer: Medicaid Other | Attending: Emergency Medicine | Admitting: Emergency Medicine

## 2013-08-20 DIAGNOSIS — M25559 Pain in unspecified hip: Secondary | ICD-10-CM | POA: Insufficient documentation

## 2013-08-20 DIAGNOSIS — M25552 Pain in left hip: Secondary | ICD-10-CM

## 2013-08-20 DIAGNOSIS — Z862 Personal history of diseases of the blood and blood-forming organs and certain disorders involving the immune mechanism: Secondary | ICD-10-CM | POA: Insufficient documentation

## 2013-08-20 DIAGNOSIS — Z993 Dependence on wheelchair: Secondary | ICD-10-CM | POA: Insufficient documentation

## 2013-08-20 DIAGNOSIS — Z8679 Personal history of other diseases of the circulatory system: Secondary | ICD-10-CM | POA: Insufficient documentation

## 2013-08-20 DIAGNOSIS — R209 Unspecified disturbances of skin sensation: Secondary | ICD-10-CM | POA: Insufficient documentation

## 2013-08-20 DIAGNOSIS — Z8639 Personal history of other endocrine, nutritional and metabolic disease: Secondary | ICD-10-CM | POA: Insufficient documentation

## 2013-08-20 DIAGNOSIS — M545 Low back pain, unspecified: Secondary | ICD-10-CM | POA: Insufficient documentation

## 2013-08-20 DIAGNOSIS — M25459 Effusion, unspecified hip: Secondary | ICD-10-CM | POA: Insufficient documentation

## 2013-08-20 DIAGNOSIS — Z950 Presence of cardiac pacemaker: Secondary | ICD-10-CM | POA: Insufficient documentation

## 2013-08-20 DIAGNOSIS — Z79899 Other long term (current) drug therapy: Secondary | ICD-10-CM | POA: Insufficient documentation

## 2013-08-20 DIAGNOSIS — G8929 Other chronic pain: Secondary | ICD-10-CM | POA: Insufficient documentation

## 2013-08-20 DIAGNOSIS — Z8669 Personal history of other diseases of the nervous system and sense organs: Secondary | ICD-10-CM | POA: Insufficient documentation

## 2013-08-20 DIAGNOSIS — M25452 Effusion, left hip: Secondary | ICD-10-CM

## 2013-08-20 DIAGNOSIS — R079 Chest pain, unspecified: Secondary | ICD-10-CM | POA: Insufficient documentation

## 2013-08-20 HISTORY — DX: Carpal tunnel syndrome, unspecified upper limb: G56.00

## 2013-08-20 MED ORDER — KETOROLAC TROMETHAMINE 60 MG/2ML IM SOLN
60.0000 mg | Freq: Once | INTRAMUSCULAR | Status: DC
  Filled 2013-08-20: qty 2

## 2013-08-20 NOTE — ED Notes (Signed)
Pt c/o L hip pain onset midnight, denies injury.

## 2013-08-20 NOTE — ED Provider Notes (Signed)
CSN: [id number]     Arrival date & time 08/20/13  0422 History   First MD Initiated Contact with Patient 08/20/13 0440     Chief Complaint  Patient presents with  . Hip Pain   (Consider location/radiation/quality/duration/timing/severity/associated sxs/prior Treatment) HPI History provided by pt.   Pt woke at 2am w/ severe pain in L hip.  Radiates up back and down LLE.  No relief w/ lyrica, which she is prescribed for carpal tunnel.  Has chronic and stable BLE paresthesias and otherwise no associated sx.  Denies fever.  Has never had pain like this before. Denies trauma.   Pt has CP and chronic low back pain and uses an electric wheelchair at home.   Past Medical History  Diagnosis Date  . Cerebral palsy   . Hypokalemia     now resolved  . 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  . Gitelman syndrome   . Chronic back pain   . Carpal tunnel syndrome    Past Surgical History  Procedure Laterality Date  . Pacemaker insertion    . Orthopedic surgery    . Abdominal hysterectomy    . Portacath placement     Family History  Problem Relation Age of Onset  . Diabetes Mother   . Hypertension Mother    History  Substance Use Topics  . Smoking status: Never Smoker   . Smokeless tobacco: Not on file  . Alcohol Use: No   OB History   Grav Para Term Preterm Abortions TAB SAB Ect Mult Living                 Review of Systems  All other systems reviewed and are negative.    Allergies  Ciprofloxacin; Cymbalta; Gabapentin; Prozac; and Sulfa antibiotics  Home Medications   Current Outpatient Rx  Name  Route  Sig  Dispense  Refill  . magnesium oxide (MAG-OX) 400 MG tablet   Oral   Take 400 mg by mouth 3 (three) times daily.           . methylphenidate (RITALIN) 20 MG tablet   Oral   Take 20 mg by mouth 3 (three) times daily.           . pregabalin (LYRICA) 50 MG capsule   Oral   Take 50 mg by mouth 2 (two) times daily.            BP  99/69  Pulse 85  Temp(Src) 98.4 F (36.9 C) (Oral)  Resp 16  SpO2 98%  LMP 06/09/2012 Physical Exam  Nursing note and vitals reviewed. Constitutional: She is oriented to person, place, and time. She appears well-developed and well-nourished. No distress.  HENT:  Head: Normocephalic and atraumatic.  Eyes:  Normal appearance  Neck: Normal range of motion.  Cardiovascular: Normal rate and regular rhythm.   Pulmonary/Chest: Effort normal and breath sounds normal. No respiratory distress.  Abdominal: Soft. Bowel sounds are normal. She exhibits no distension and no mass. There is no tenderness. There is no rebound and no guarding.  Genitourinary:  No CVA tenderness  Musculoskeletal: Normal range of motion.  L hip w/out deformity.  Entire hip as well as L groin  proximal thigh and SI joint ttp.  Minimal tenderness lumbar spine. Pain w/ passive ROM.    Neurological: She is alert and oriented to person, place, and time.  Skin: Skin is warm and dry. No rash noted.  Psychiatric: She has a normal mood and  affect. Her behavior is normal.    ED Course  Procedures (including critical care time) Labs Review Labs Reviewed - No data to display Imaging Review Dg Hip Complete Left  08/20/2013   *RADIOLOGY REPORT*  Clinical Data: Left hip pain.  No known trauma.  LEFT HIP - COMPLETE 2+ VIEW  Comparison: CT abdomen 12/22/2008  Findings: Valgus orientation of the hips.  The pelvis and left hip appear otherwise intact.  No displaced acute fractures are demonstrated.  No focal bone lesion or bone destruction.  The SI joints, symphysis pubis, and pelvic rim are not displaced. Configuration appears stable since previous CT scan.  IMPRESSION: Bilateral coxa valga.  No displaced fractures identified.   Original Report Authenticated By: Burman Nieves, [name]    MDM  No diagnosis found. 32yo F w/ CP presents w/ non-traumatic L hip pain that woke her from sleep at 2am today.  Afebrile, uncomfortable appearing,  entire L hip/groin as well as proximal thigh ttp, pain w/ passive ROM, no NV LLE on exam.  Xray non-acute.  On re-examination, patient appears uncomfortable.  Tenderness has improved but continues to have pain w/ ROM and will not bear weight.  Will CT to r/o occult fx.  Mathis Fare, PA-C to resume care.     Otilio Miu, PA-C 08/20/13 [phone]

## 2013-08-20 NOTE — ED Provider Notes (Signed)
Medical screening examination/treatment/procedure(s) were performed by non-physician practitioner and as supervising physician I was immediately available for consultation/collaboration.   Junius Argyle, MD 08/20/13 [phone]

## 2013-08-20 NOTE — ED Notes (Signed)
Pt refused to have labs drawn. PA Robyn aware.

## 2013-08-20 NOTE — ED Provider Notes (Signed)
Medical screening examination/treatment/procedure(s) were performed by non-physician practitioner and as supervising physician I was immediately available for consultation/collaboration.   Junius Argyle, MD 08/20/13 [phone]

## 2013-08-20 NOTE — ED Notes (Signed)
Report received from previous shift; per report pt has refused to have PIV inserted as well as IM pain medication.

## 2013-08-20 NOTE — ED Provider Notes (Signed)
Care assumed from Arthur, PA-C at shift change. Patient with hx of CP, complaining of left hip pain. Xray of L hip without any acute finding, patient still uncomfortable, unable to bear weight. CT scan hip pending.  7:47 AM  CT hip showing L hip effusion. Patient is afebrile, however on exam still TTP throughout hip, will not bear weight. Will check labs- cbc, sed rate, CRP. Patient has f/u with orthopedic today at 11:30. Patient discussed with my current attending Dr. Fayrene Fearing who agrees with plan of care.  7:54 AM Patient refusing blood work. Requesting to leave, will f/u with orthopedist. I discussed reasoning behind blood work with patient and mom, who both state their understanding but would prefer to go home. Close return precautions discussed.   Dg Hip Complete Left  08/20/2013   *RADIOLOGY REPORT*  Clinical Data: Left hip pain.  No known trauma.  LEFT HIP - COMPLETE 2+ VIEW  Comparison: CT abdomen 12/22/2008  Findings: Valgus orientation of the hips.  The pelvis and left hip appear otherwise intact.  No displaced acute fractures are demonstrated.  No focal bone lesion or bone destruction.  The SI joints, symphysis pubis, and pelvic rim are not displaced. Configuration appears stable since previous CT scan.  IMPRESSION: Bilateral coxa valga.  No displaced fractures identified.   Original Report Authenticated By: Burman Nieves, [name]   Ct Hip Left Wo Contrast  08/20/2013   *RADIOLOGY REPORT*  Clinical Data: Left hip pain.  CT OF THE LEFT HIP WITHOUT CONTRAST  Technique:  Multidetector CT imaging was performed according to the standard protocol. Multiplanar CT image reconstructions were also generated.  Comparison: Radiographs dated 08/20/2048  Findings: There is a prominent left hip effusion.  The osseous structures are normal.  There is no fracture or dislocation or arthritis.  No soft tissue calcification.  No appreciable bursitis.  IMPRESSION: Left hip effusion.  Is the patient febrile?    Original Report Authenticated By: Francene Boyers, [name]     Trevor Mace, PA-C 08/20/13 [phone]

## 2013-08-22 ENCOUNTER — Emergency Department: Payer: Self-pay | Admitting: Internal Medicine

## 2013-08-25 ENCOUNTER — Emergency Department: Payer: Self-pay | Admitting: Emergency Medicine

## 2013-08-25 LAB — URINALYSIS, COMPLETE
Bilirubin,UR: NEGATIVE
Ketone: NEGATIVE
Ph: 5 (ref 4.5–8.0)
Protein: NEGATIVE
RBC,UR: 16 /HPF (ref 0–5)
Specific Gravity: 1.02 (ref 1.003–1.030)
Transitional Epi: 1
WBC UR: 571 /HPF (ref 0–5)

## 2013-08-30 ENCOUNTER — Encounter (HOSPITAL_COMMUNITY): Payer: Self-pay | Admitting: Emergency Medicine

## 2013-08-30 ENCOUNTER — Emergency Department: Payer: Self-pay | Admitting: Emergency Medicine

## 2013-08-30 ENCOUNTER — Emergency Department (INDEPENDENT_AMBULATORY_CARE_PROVIDER_SITE_OTHER)
Admission: EM | Admit: 2013-08-30 | Discharge: 2013-08-30 | Disposition: A | Discharge status: Home / Self Care | Payer: Medicaid Other | Source: Home / Self Care | Attending: Emergency Medicine | Admitting: Emergency Medicine

## 2013-08-30 DIAGNOSIS — H9209 Otalgia, unspecified ear: Secondary | ICD-10-CM

## 2013-08-30 DIAGNOSIS — H9202 Otalgia, left ear: Secondary | ICD-10-CM

## 2013-08-30 MED ORDER — CLOTRIMAZOLE-BETAMETHASONE 1-0.05 % EX CREA: TOPICAL_CREAM | CUTANEOUS | Status: DC

## 2013-08-30 NOTE — ED Notes (Signed)
C/o ear lobe redness

## 2013-08-30 NOTE — ED Provider Notes (Signed)
CSN: [id number]     Arrival date & time 08/30/13  1924 History   First MD Initiated Contact with Patient 08/30/13 2027     Chief Complaint  Patient presents with  . Otalgia   (Consider location/radiation/quality/duration/timing/severity/associated sxs/prior Treatment) HPI Comments: 32 year old female presents complaining of left ear redness and heat. She has had this numerous times throughout the past few years. She says she has a spot of dry skin on the back of the ear. They have never seen anyone about this but they just decided to come in to see what can be done. Does not really bother her and the area is not painful. She says the ear which is randomly term red and feel warm. This usually lasts anywhere from a few minutes to a few days before going away on its own. Denies fever, chills, or any other current changes in her chronic systemic symptoms associated with Gitelman syndrome and her chronic musculoskeletal pains.  Patient is a 32 y.o. female presenting with ear pain.  Otalgia Associated symptoms: no abdominal pain, no cough, no fever, no rash and no vomiting     Past Medical History  Diagnosis Date  . Cerebral palsy   . Hypokalemia     now resolved  . 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  . Gitelman syndrome   . Chronic back pain   . Carpal tunnel syndrome    Past Surgical History  Procedure Laterality Date  . Pacemaker insertion    . Orthopedic surgery    . Abdominal hysterectomy    . Portacath placement     Family History  Problem Relation Age of Onset  . Diabetes Mother   . Hypertension Mother    History  Substance Use Topics  . Smoking status: Never Smoker   . Smokeless tobacco: Not on file  . Alcohol Use: No   OB History   Grav Para Term Preterm Abortions TAB SAB Ect Mult Living                 Review of Systems  Constitutional: Negative for fever and chills.  HENT: Positive for ear pain.   Eyes: Negative for visual  disturbance.  Respiratory: Negative for cough and shortness of breath.   Cardiovascular: Negative for chest pain, palpitations and leg swelling.  Gastrointestinal: Negative for nausea, vomiting and abdominal pain.  Endocrine: Negative for polydipsia and polyuria.  Genitourinary: Negative for dysuria, urgency and frequency.  Musculoskeletal: Negative for myalgias and arthralgias.  Skin: Negative for rash.  Neurological: Negative for dizziness, weakness and light-headedness.    Allergies  Cymbalta; Gabapentin; Prozac; Sulfa antibiotics; and Ciprofloxacin  Home Medications   Current Outpatient Rx  Name  Route  Sig  Dispense  Refill  . clotrimazole-betamethasone (LOTRISONE) cream      Apply to affected area 2 times daily prn   45 g   0   . magnesium oxide (MAG-OX) 400 MG tablet   Oral   Take 400 mg by mouth 3 (three) times daily.           . methylphenidate (RITALIN) 20 MG tablet   Oral   Take 20 mg by mouth 3 (three) times daily.           . pregabalin (LYRICA) 50 MG capsule   Oral   Take 50 mg by mouth 2 (two) times daily.            BP 105/70  Pulse 89  Temp(Src)  97.8 F (36.6 C) (Oral)  Resp 16  SpO2 97%  LMP 06/09/2012 Physical Exam  Nursing note and vitals reviewed. Constitutional: She is oriented to person, place, and time. Vital signs are normal. She appears well-developed and well-nourished. No distress.  HENT:  Head: Normocephalic and atraumatic.  Right Ear: Hearing, tympanic membrane, external ear and ear canal normal.  Left Ear: Hearing, tympanic membrane and ear canal normal.  External left ear is erythematous and warm. There is a spot of dry skin with a slight scale behind the ear  Pulmonary/Chest: Effort normal. No respiratory distress.  Neurological: She is alert and oriented to person, place, and time. She has normal strength. Coordination normal.  Skin: Skin is warm and dry. No rash noted. She is not diaphoretic.  Psychiatric: She has a  normal mood and affect. Judgment normal.    ED Course  Procedures (including critical care time) Labs Review Labs Reviewed - No data to display Imaging Review No results found.  MDM   1. Otalgia of left ear    Meds ordered this encounter  Medications  . clotrimazole-betamethasone (LOTRISONE) cream    Sig: Apply to affected area 2 times daily prn    Dispense:  45 g    Refill:  0   Followup if not improving     Graylon Good, PA-C 08/30/13 2122

## 2013-08-30 NOTE — ED Provider Notes (Signed)
Medical screening examination/treatment/procedure(s) were performed by non-physician practitioner and as supervising physician I was immediately available for consultation/collaboration.  Kassy Mcenroe, [name]  Sharon Rubis C Amariya Liskey, MD 08/30/13 2156 

## 2013-09-05 ENCOUNTER — Emergency Department: Payer: Self-pay | Admitting: Emergency Medicine

## 2013-09-05 LAB — COMPREHENSIVE METABOLIC PANEL
Anion Gap: 6 — ABNORMAL LOW (ref 7–16)
Bilirubin,Total: 0.3 mg/dL (ref 0.2–1.0)
Calcium, Total: 9.3 mg/dL (ref 8.5–10.1)
Chloride: 106 mmol/L (ref 98–107)
EGFR (Non-African Amer.): >60
Glucose: 105 mg/dL — ABNORMAL HIGH (ref 65–99)
Osmolality: 280 (ref 275–301)
Potassium: 3.9 mmol/L (ref 3.5–5.1)
SGOT(AST): 44 U/L — ABNORMAL HIGH (ref 15–37)
Total Protein: 7.3 g/dL (ref 6.4–8.2)

## 2013-09-05 LAB — CBC
HCT: 38 % (ref 35.0–47.0)
HGB: 13.3 g/dL (ref 12.0–16.0)
MCHC: 35 g/dL (ref 32.0–36.0)
Platelet: 161 10*3/uL (ref 150–440)
RBC: 4.6 10*6/uL (ref 3.80–5.20)
WBC: 7.2 10*3/uL (ref 3.6–11.0)

## 2013-09-05 LAB — MAGNESIUM: Magnesium: 0.9 mg/dL — ABNORMAL LOW

## 2013-09-05 LAB — URINALYSIS, COMPLETE
Bilirubin,UR: NEGATIVE
Blood: NEGATIVE
Glucose,UR: NEGATIVE mg/dL (ref 0–75)
Hyaline Cast: 2
Leukocyte Esterase: NEGATIVE
Nitrite: NEGATIVE
Ph: 5 (ref 4.5–8.0)
Protein: NEGATIVE
Specific Gravity: 1.018 (ref 1.003–1.030)
Squamous Epithelial: <1
WBC UR: 2 /HPF (ref 0–5)

## 2013-09-05 LAB — LIPASE, BLOOD: Lipase: 132 U/L (ref 73–393)

## 2013-09-07 ENCOUNTER — Emergency Department: Payer: Self-pay | Admitting: Emergency Medicine

## 2013-09-07 LAB — URINALYSIS, COMPLETE
Bacteria: NONE SEEN
Bilirubin,UR: NEGATIVE
Glucose,UR: NEGATIVE mg/dL (ref 0–75)
Ketone: NEGATIVE
Nitrite: NEGATIVE
RBC,UR: 3 /HPF (ref 0–5)
Specific Gravity: 1.017 (ref 1.003–1.030)
Squamous Epithelial: <1

## 2013-09-09 ENCOUNTER — Emergency Department: Payer: Self-pay | Admitting: Internal Medicine

## 2013-09-09 LAB — BASIC METABOLIC PANEL
Anion Gap: 7 (ref 7–16)
BUN: 17 mg/dL (ref 7–18)
Calcium, Total: 9.7 mg/dL (ref 8.5–10.1)
EGFR (African American): >60
EGFR (Non-African Amer.): >60
Osmolality: 281 (ref 275–301)
Potassium: 3.4 mmol/L — ABNORMAL LOW (ref 3.5–5.1)
Sodium: 140 mmol/L (ref 136–145)

## 2013-09-09 LAB — MAGNESIUM: Magnesium: 0.9 mg/dL — ABNORMAL LOW

## 2013-09-14 ENCOUNTER — Emergency Department: Payer: Self-pay | Admitting: Emergency Medicine

## 2013-09-14 LAB — BASIC METABOLIC PANEL
Calcium, Total: 9.7 mg/dL (ref 8.5–10.1)
Chloride: 104 mmol/L (ref 98–107)
Co2: 30 mmol/L (ref 21–32)
Creatinine: 0.77 mg/dL (ref 0.60–1.30)
EGFR (African American): >60
EGFR (Non-African Amer.): >60
Osmolality: 281 (ref 275–301)
Potassium: 4.4 mmol/L (ref 3.5–5.1)
Sodium: 139 mmol/L (ref 136–145)

## 2013-09-14 LAB — CBC
HCT: 40.8 % (ref 35.0–47.0)
HGB: 14.2 g/dL (ref 12.0–16.0)
MCH: 29.1 pg (ref 26.0–34.0)
MCHC: 35 g/dL (ref 32.0–36.0)
MCV: 83 fL (ref 80–100)
Platelet: 128 10*3/uL — ABNORMAL LOW (ref 150–440)

## 2013-09-19 ENCOUNTER — Emergency Department: Payer: Self-pay | Admitting: Internal Medicine

## 2013-09-19 LAB — COMPREHENSIVE METABOLIC PANEL
Alkaline Phosphatase: 119 U/L (ref 50–136)
BUN: 15 mg/dL (ref 7–18)
Bilirubin,Total: 0.5 mg/dL (ref 0.2–1.0)
Co2: 29 mmol/L (ref 21–32)
EGFR (African American): >60
Osmolality: 277 (ref 275–301)
Potassium: 4.2 mmol/L (ref 3.5–5.1)
SGOT(AST): 82 U/L — ABNORMAL HIGH (ref 15–37)
SGPT (ALT): 100 U/L — ABNORMAL HIGH (ref 12–78)
Sodium: 138 mmol/L (ref 136–145)

## 2013-09-19 LAB — CBC
HCT: 39.4 % (ref 35.0–47.0)
MCV: 83 fL (ref 80–100)
RBC: 4.76 10*6/uL (ref 3.80–5.20)
WBC: 8.7 10*3/uL (ref 3.6–11.0)

## 2013-09-19 LAB — MAGNESIUM: Magnesium: 0.7 mg/dL — ABNORMAL LOW

## 2013-09-19 LAB — TROPONIN I: Troponin-I: <0.02 ng/mL

## 2013-09-19 LAB — CK TOTAL AND CKMB (NOT AT ARMC): CK-MB: 1.5 ng/mL (ref 0.5–3.6)

## 2013-09-20 ENCOUNTER — Emergency Department: Payer: Self-pay | Admitting: Emergency Medicine

## 2013-09-20 LAB — MAGNESIUM: Magnesium: 0.6 mg/dL — ABNORMAL LOW

## 2013-09-20 LAB — COMPREHENSIVE METABOLIC PANEL
Albumin: 4.4 g/dL (ref 3.4–5.0)
Alkaline Phosphatase: 118 U/L (ref 50–136)
BUN: 18 mg/dL (ref 7–18)
Bilirubin,Total: 0.5 mg/dL (ref 0.2–1.0)
Co2: 27 mmol/L (ref 21–32)
Creatinine: 0.83 mg/dL (ref 0.60–1.30)
EGFR (African American): >60
EGFR (Non-African Amer.): >60
Glucose: 91 mg/dL (ref 65–99)
Total Protein: 7.9 g/dL (ref 6.4–8.2)

## 2013-09-20 LAB — CBC
HCT: 37.6 % (ref 35.0–47.0)
HGB: 13.4 g/dL (ref 12.0–16.0)
MCH: 29.2 pg (ref 26.0–34.0)
Platelet: 167 10*3/uL (ref 150–440)
RBC: 4.58 10*6/uL (ref 3.80–5.20)
RDW: 13.7 % (ref 11.5–14.5)
WBC: 9.4 10*3/uL (ref 3.6–11.0)

## 2013-09-23 ENCOUNTER — Emergency Department: Payer: Self-pay | Admitting: Emergency Medicine

## 2013-09-25 ENCOUNTER — Emergency Department: Payer: Self-pay | Admitting: Emergency Medicine

## 2013-09-26 ENCOUNTER — Emergency Department: Payer: Self-pay | Admitting: Emergency Medicine

## 2013-09-28 ENCOUNTER — Encounter (HOSPITAL_COMMUNITY): Payer: Self-pay | Admitting: Emergency Medicine

## 2013-09-28 ENCOUNTER — Telehealth (HOSPITAL_COMMUNITY): Payer: Self-pay | Admitting: *Deleted

## 2013-09-28 ENCOUNTER — Emergency Department (HOSPITAL_COMMUNITY)
Admission: EM | Admit: 2013-09-28 | Discharge: 2013-09-28 | Disposition: A | Discharge status: Home / Self Care | Payer: Medicaid Other | Attending: Emergency Medicine | Admitting: Emergency Medicine

## 2013-09-28 DIAGNOSIS — G8929 Other chronic pain: Secondary | ICD-10-CM | POA: Insufficient documentation

## 2013-09-28 DIAGNOSIS — M25559 Pain in unspecified hip: Secondary | ICD-10-CM | POA: Insufficient documentation

## 2013-09-28 DIAGNOSIS — Z3202 Encounter for pregnancy test, result negative: Secondary | ICD-10-CM | POA: Insufficient documentation

## 2013-09-28 DIAGNOSIS — M25552 Pain in left hip: Secondary | ICD-10-CM

## 2013-09-28 DIAGNOSIS — Z79899 Other long term (current) drug therapy: Secondary | ICD-10-CM | POA: Insufficient documentation

## 2013-09-28 DIAGNOSIS — G809 Cerebral palsy, unspecified: Secondary | ICD-10-CM | POA: Insufficient documentation

## 2013-09-28 LAB — CBC WITH DIFFERENTIAL/PLATELET
HCT: 34.8 % — ABNORMAL LOW (ref 36.0–46.0)
Hemoglobin: 12.3 g/dL (ref 12.0–15.0)
Lymphs Abs: 2.1 10*3/uL (ref 0.7–4.0)
MCH: 29.1 pg (ref 26.0–34.0)
Monocytes Relative: 7 % (ref 3–12)
Neutro Abs: 3.9 10*3/uL (ref 1.7–7.7)
Neutrophils Relative %: 61 % (ref 43–77)
RBC: 4.22 MIL/uL (ref 3.87–5.11)

## 2013-09-28 LAB — URINALYSIS, ROUTINE W REFLEX MICROSCOPIC
Glucose, UA: NEGATIVE mg/dL
Hgb urine dipstick: NEGATIVE
Ketones, ur: NEGATIVE mg/dL
Leukocytes, UA: NEGATIVE
pH: 5.5 (ref 5.0–8.0)

## 2013-09-28 LAB — POCT I-STAT, CHEM 8
BUN: 16 mg/dL (ref 6–23)
Chloride: 101 mEq/L (ref 96–112)
Sodium: 142 mEq/L (ref 135–145)

## 2013-09-28 LAB — C-REACTIVE PROTEIN: CRP: <0.5 mg/dL — ABNORMAL LOW (ref <0.60)

## 2013-09-28 LAB — PREGNANCY, URINE: Preg Test, Ur: NEGATIVE

## 2013-09-28 MED ORDER — HEPARIN SOD (PORK) LOCK FLUSH 100 UNIT/ML IV SOLN
500.0000 [IU] | Freq: Once | INTRAVENOUS | Status: AC
  Administered 2013-09-28: 500 [IU]
  Filled 2013-09-28: qty 5

## 2013-09-28 NOTE — ED Notes (Signed)
Will obtain labs from port. Infusion is paused.

## 2013-09-28 NOTE — ED Provider Notes (Signed)
Medical screening examination/treatment/procedure(s) were conducted as a shared visit with non-physician practitioner(s) and myself.  I personally evaluated the patient during the encounter  32 year old female with history of cerebral palsy presenting with left hip pain which been present for several weeks.  On exam, normal pulses and perfusion of bilateral lower extremities. Full range of motion of left hip, albeit with moderate pain.  Exam not consistent with septic joint.  She will follow up with her orthopedist regarding her ongoing left hip pain.  Clinical Impression: 1. Left hip pain       Candyce Churn, MD 09/28/13 1859

## 2013-09-28 NOTE — ED Notes (Signed)
Patient refuses to change into gown or lay in stretcher due to painful joints and contractures s/t cerebral palsy.

## 2013-09-28 NOTE — ED Provider Notes (Signed)
Medical screening examination/treatment/procedure(s) were conducted as a shared visit with non-physician practitioner(s) and myself.  I personally evaluated the patient during the encounter.   Please see my separate note.     Candyce Churn, MD 09/28/13 [phone]

## 2013-09-28 NOTE — ED Provider Notes (Signed)
CSN: [id number]     Arrival date & time 09/28/13  1544 History   First MD Initiated Contact with Patient 09/28/13 1556     Chief Complaint  Patient presents with  . Hip Pain   (Consider location/radiation/quality/duration/timing/severity/associated sxs/prior Treatment) HPI  32 year old female with history of cerebral palsy, chronic back pain, chronic right knee pain presents for evaluations of left hip pain. History obtained through patient and through mom was at bedside. Patient reports she has had a pain to her left hip for over a month. Patient states "it feels like there's water in it" the pain has been persistent, worsening with movement, did improve after receiving a steroid shot given by orthopedic Dr. a month ago. Patient does not normally ambulate, but able to crawl and uses wheel chair at home.  She denies any fever, chills, abnormal pain, dysuria, hematuria, new numbness or weakness, rash, or recent trauma. 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 The symptom has not worsened, however has not gotten better.  Past Medical History  Diagnosis Date  . Cerebral palsy   . Hypokalemia     now resolved  . 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  . Gitelman syndrome   . Chronic back pain   . Carpal tunnel syndrome    Past Surgical History  Procedure Laterality Date  . Pacemaker insertion    . Orthopedic surgery    . Abdominal hysterectomy    . Portacath placement     Family History  Problem Relation Age of Onset  . Diabetes Mother   . Hypertension Mother    History  Substance Use Topics  . Smoking status: Never Smoker   . Smokeless tobacco: Not on file  . Alcohol Use: No   OB History   Grav Para Term Preterm Abortions TAB SAB Ect Mult Living                 Review of Systems  Constitutional: Negative for fever.  Musculoskeletal: Positive for arthralgias and joint swelling.  Skin: Negative for rash  and wound.  Neurological: Negative for numbness.    Allergies  Cymbalta; Gabapentin; Prozac; Sulfa antibiotics; and Ciprofloxacin  Home Medications   Current Outpatient Rx  Name  Route  Sig  Dispense  Refill  . clotrimazole-betamethasone (LOTRISONE) cream      Apply to affected area 2 times daily prn   45 g   0   . magnesium oxide (MAG-OX) 400 MG tablet   Oral   Take 400 mg by mouth 2 (two) times daily.          . methylphenidate (RITALIN) 20 MG tablet   Oral   Take 20 mg by mouth 3 (three) times daily.           . pregabalin (LYRICA) 50 MG capsule   Oral   Take 50 mg by mouth 3 (three) times daily.           BP 134/94  Pulse 85  Temp(Src) 98.2 F (36.8 C) (Oral)  Resp 18  Wt 97 lb (43.999 kg)  SpO2 100%  LMP 06/09/2012 Physical Exam  Nursing note and vitals reviewed. Constitutional: She is oriented to person, place, and time.  Patient is thin and ill-appearing, with  physical changes consistence with cerebral palsy. She does not look toxic.   HENT:  Head: Normocephalic and atraumatic.  Eyes: Conjunctivae are normal.  Neck: Neck supple.  Abdominal: Soft. There  is no tenderness.  Musculoskeletal: She exhibits tenderness (Left hip in mildly tender throughout with crepitus with range of motion but no overlying skin changes, erythema, or rash.).  Neurological: She is alert and oriented to person, place, and time.  Difficult to assess patellar deep tendon reflex due to lower extremities muscle rigidity.  Intact distal pulses  Skin: No rash noted.  R knee: a quarter size skin ulceration to anterior knee consistent with skin picking.    Psychiatric: She has a normal mood and affect.    ED Course  Procedures (including critical care time)  4:42 PM Pt has L hip pain, CT a month ago shows L hip effusion.  Pt does not have sxs to suggest infection such as septic arthritis.  Mom report orthopedist specialist, Dr. Ethelene Hal has also attempted to tap L hip without  adequate sample.  Pt also received depomedrol steroid shot a month ago by him as well.    6:59 PM Sed Rates and CRP has not resulted yet but labs so far are reassuring. Her sxs is suggest of bursitis, likely 2/2 overuse of L hip due to chronic pain to R knee.   Pt request for discharge. Pain medication offered however pt sts she has pain medication at home.  She will f/u with Dr. Ethelene Hal for further care.  My attending has seen and evaluate pt.  Return precaution discussed.  Pt stable for discharge.     Labs Review Labs Reviewed  CBC WITH DIFFERENTIAL - Abnormal; Notable for the following:    HCT 34.8 (*)    All other components within normal limits  POCT I-STAT, CHEM 8 - Abnormal; Notable for the following:    Potassium 3.3 (*)    All other components within normal limits  URINALYSIS, ROUTINE W REFLEX MICROSCOPIC  PREGNANCY, URINE  SEDIMENTATION RATE  C-REACTIVE PROTEIN   Imaging Review No results found.  EKG Interpretation   None       MDM   1. Left hip pain    BP 134/94  Pulse 85  Temp(Src) 98.2 F (36.8 C) (Oral)  Resp 18  Wt 97 lb (43.999 kg)  SpO2 100%  LMP 06/09/2012     Fayrene Helper, PA-C 09/28/13 1901

## 2013-09-28 NOTE — ED Notes (Signed)
Pt c/o l hip pain x1 month.  Pt has hx of cerebral palsy.  Denies injury.  Reports she thinks it's inflamed.

## 2013-09-30 ENCOUNTER — Emergency Department: Payer: Self-pay | Admitting: Internal Medicine

## 2013-10-01 ENCOUNTER — Emergency Department: Payer: Self-pay | Admitting: Emergency Medicine

## 2013-10-01 LAB — BASIC METABOLIC PANEL
BUN: 19 mg/dL — ABNORMAL HIGH (ref 7–18)
Chloride: 104 mmol/L (ref 98–107)
Co2: 30 mmol/L (ref 21–32)
EGFR (Non-African Amer.): >60
Glucose: 99 mg/dL (ref 65–99)
Osmolality: 280 (ref 275–301)

## 2013-10-01 LAB — MAGNESIUM: Magnesium: 0.8 mg/dL — ABNORMAL LOW

## 2013-10-04 ENCOUNTER — Emergency Department: Payer: Self-pay | Admitting: Emergency Medicine

## 2013-10-15 ENCOUNTER — Emergency Department: Payer: Self-pay | Admitting: Emergency Medicine

## 2013-10-23 ENCOUNTER — Emergency Department: Payer: Self-pay | Admitting: Emergency Medicine

## 2013-10-23 LAB — CBC WITH DIFFERENTIAL/PLATELET
Basophil #: 0.1 10*3/uL (ref 0.0–0.1)
Basophil %: 0.9 %
Eosinophil #: 0 10*3/uL (ref 0.0–0.7)
Eosinophil %: 0.2 %
HGB: 12.6 g/dL (ref 12.0–16.0)
MCH: 29.2 pg (ref 26.0–34.0)
MCHC: 34.9 g/dL (ref 32.0–36.0)
MCV: 84 fL (ref 80–100)
Monocyte #: 0.4 x10 3/mm (ref 0.2–0.9)
Monocyte %: 5.6 %
Neutrophil %: 68.3 %
Platelet: 129 10*3/uL — ABNORMAL LOW (ref 150–440)
RDW: 13.7 % (ref 11.5–14.5)

## 2013-10-23 LAB — BASIC METABOLIC PANEL
Anion Gap: 6 — ABNORMAL LOW (ref 7–16)
Chloride: 108 mmol/L — ABNORMAL HIGH (ref 98–107)
Creatinine: 0.65 mg/dL (ref 0.60–1.30)
EGFR (African American): >60
Glucose: 82 mg/dL (ref 65–99)
Osmolality: 276 (ref 275–301)

## 2013-10-23 LAB — MAGNESIUM: Magnesium: 0.5 mg/dL — ABNORMAL LOW

## 2013-10-28 ENCOUNTER — Emergency Department: Payer: Self-pay | Admitting: Emergency Medicine

## 2013-10-31 ENCOUNTER — Emergency Department: Payer: Self-pay | Admitting: Emergency Medicine

## 2013-10-31 LAB — COMPREHENSIVE METABOLIC PANEL
Alkaline Phosphatase: 101 U/L (ref 50–136)
Anion Gap: 4 — ABNORMAL LOW (ref 7–16)
BUN: 16 mg/dL (ref 7–18)
Calcium, Total: 9.6 mg/dL (ref 8.5–10.1)
Co2: 29 mmol/L (ref 21–32)
Creatinine: 0.76 mg/dL (ref 0.60–1.30)
EGFR (African American): >60
EGFR (Non-African Amer.): >60
Glucose: 89 mg/dL (ref 65–99)
Potassium: 3.7 mmol/L (ref 3.5–5.1)
SGOT(AST): 31 U/L (ref 15–37)
SGPT (ALT): 50 U/L (ref 12–78)
Sodium: 138 mmol/L (ref 136–145)
Total Protein: 7.3 g/dL (ref 6.4–8.2)

## 2013-11-03 ENCOUNTER — Emergency Department: Payer: Self-pay | Admitting: Emergency Medicine

## 2013-11-03 LAB — COMPREHENSIVE METABOLIC PANEL
Alkaline Phosphatase: 87 U/L
Anion Gap: 4 — ABNORMAL LOW (ref 7–16)
BUN: 16 mg/dL (ref 7–18)
Calcium, Total: 9.2 mg/dL (ref 8.5–10.1)
Chloride: 108 mmol/L — ABNORMAL HIGH (ref 98–107)
Co2: 28 mmol/L (ref 21–32)
EGFR (African American): >60
EGFR (Non-African Amer.): >60
Glucose: 96 mg/dL (ref 65–99)
Osmolality: 280 (ref 275–301)
Sodium: 140 mmol/L (ref 136–145)

## 2013-11-04 ENCOUNTER — Emergency Department: Payer: Self-pay | Admitting: Emergency Medicine

## 2013-11-07 ENCOUNTER — Emergency Department (HOSPITAL_COMMUNITY)
Admission: EM | Admit: 2013-11-07 | Discharge: 2013-11-07 | Disposition: A | Discharge status: Home / Self Care | Payer: Medicaid Other | Attending: Emergency Medicine | Admitting: Emergency Medicine

## 2013-11-07 ENCOUNTER — Encounter (HOSPITAL_COMMUNITY): Payer: Self-pay | Admitting: Emergency Medicine

## 2013-11-07 ENCOUNTER — Emergency Department (HOSPITAL_COMMUNITY): Payer: Medicaid Other

## 2013-11-07 DIAGNOSIS — Y929 Unspecified place or not applicable: Secondary | ICD-10-CM | POA: Insufficient documentation

## 2013-11-07 DIAGNOSIS — Z8669 Personal history of other diseases of the nervous system and sense organs: Secondary | ICD-10-CM | POA: Insufficient documentation

## 2013-11-07 DIAGNOSIS — S239XXA Sprain of unspecified parts of thorax, initial encounter: Secondary | ICD-10-CM | POA: Insufficient documentation

## 2013-11-07 DIAGNOSIS — Z8679 Personal history of other diseases of the circulatory system: Secondary | ICD-10-CM | POA: Insufficient documentation

## 2013-11-07 DIAGNOSIS — X58XXXA Exposure to other specified factors, initial encounter: Secondary | ICD-10-CM | POA: Insufficient documentation

## 2013-11-07 DIAGNOSIS — Y939 Activity, unspecified: Secondary | ICD-10-CM | POA: Insufficient documentation

## 2013-11-07 DIAGNOSIS — Z8639 Personal history of other endocrine, nutritional and metabolic disease: Secondary | ICD-10-CM | POA: Insufficient documentation

## 2013-11-07 DIAGNOSIS — Z950 Presence of cardiac pacemaker: Secondary | ICD-10-CM | POA: Insufficient documentation

## 2013-11-07 DIAGNOSIS — G8929 Other chronic pain: Secondary | ICD-10-CM | POA: Insufficient documentation

## 2013-11-07 DIAGNOSIS — Z79899 Other long term (current) drug therapy: Secondary | ICD-10-CM | POA: Insufficient documentation

## 2013-11-07 DIAGNOSIS — S29012A Strain of muscle and tendon of back wall of thorax, initial encounter: Secondary | ICD-10-CM

## 2013-11-07 DIAGNOSIS — Z862 Personal history of diseases of the blood and blood-forming organs and certain disorders involving the immune mechanism: Secondary | ICD-10-CM | POA: Insufficient documentation

## 2013-11-07 LAB — URINALYSIS W MICROSCOPIC + REFLEX CULTURE
Glucose, UA: NEGATIVE mg/dL
Leukocytes, UA: NEGATIVE
Protein, ur: NEGATIVE mg/dL
Specific Gravity, Urine: 1.024 (ref 1.005–1.030)
pH: 6 (ref 5.0–8.0)

## 2013-11-07 NOTE — ED Notes (Addendum)
Pt states she has had pain in her rt shoulder blade for 5 days. The pt states the pain feels like pins and needles. Pt has a hx of cerebral palsy.

## 2013-11-07 NOTE — ED Provider Notes (Signed)
CSN: [id number]     Arrival date & time 11/07/13  1607 History   First MD Initiated Contact with Patient 11/07/13 1649     Chief Complaint  Patient presents with  . Shoulder Pain   (Consider location/radiation/quality/duration/timing/severity/associated sxs/prior Treatment) HPI [name] is a 32 y.o. female with hx of cerebral palsy and chronic pain, presents today with complaint of right upper back pain. States pain began several days ago. Denies any injuries. Pain worsened with movement, nothing makes it better. Pt did not take any medications for this, per mom "she does not like taking medicines." pt denies any fever, chills, chest pain, recent travel or surgeries, shortness of breath, urinary symptoms. Pt states "I think its bursitis."   Past Medical History  Diagnosis Date  . Cerebral palsy   . Hypokalemia     now resolved  . 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  . Gitelman syndrome   . Chronic back pain   . Carpal tunnel syndrome    Past Surgical History  Procedure Laterality Date  . Pacemaker insertion    . Orthopedic surgery    . Abdominal hysterectomy    . Portacath placement     Family History  Problem Relation Age of Onset  . Diabetes Mother   . Hypertension Mother    History  Substance Use Topics  . Smoking status: Never Smoker   . Smokeless tobacco: Not on file  . Alcohol Use: No   OB History   Grav Para Term Preterm Abortions TAB SAB Ect Mult Living                 Review of Systems  Constitutional: Negative for fever and chills.  Respiratory: Negative for cough, chest tightness and shortness of breath.   Cardiovascular: Negative for chest pain, palpitations and leg swelling.  Gastrointestinal: Negative for nausea, vomiting, abdominal pain and diarrhea.  Genitourinary: Negative for dysuria, flank pain, vaginal bleeding, vaginal discharge, vaginal pain and pelvic pain.  Musculoskeletal: Positive for arthralgias,  back pain and myalgias. Negative for neck pain and neck stiffness.  Skin: Negative for rash.  Neurological: Negative for dizziness, weakness, numbness and headaches.  All other systems reviewed and are negative.    Allergies  Cymbalta; Gabapentin; Prozac; Sulfa antibiotics; and Ciprofloxacin  Home Medications   Current Outpatient Rx  Name  Route  Sig  Dispense  Refill  . dextrose 5 % SOLN 50 mL with magnesium sulfate 50 % SOLN 500 mg   Intravenous   Inject 2 mg into the vein once.         Marland Kitchen EPINEPHrine (EPIPEN) 0.3 mg/0.3 mL SOAJ injection   Intramuscular   Inject 0.3 mg into the muscle once.         . magnesium oxide (MAG-OX) 400 MG tablet   Oral   Take 400 mg by mouth 2 (two) times daily.          . methylphenidate (RITALIN) 20 MG tablet   Oral   Take 20 mg by mouth.          . pregabalin (LYRICA) 50 MG capsule   Oral   Take 50 mg by mouth 3 (three) times daily.           BP 126/75  Pulse 88  Temp(Src) 97.6 F (36.4 C) (Oral)  Resp 16  Wt 95 lb (43.092 kg)  SpO2 100%  LMP 06/09/2012 Physical Exam  Nursing note and vitals reviewed. Constitutional:  She appears well-developed and well-nourished. No distress.  HENT:  Head: Normocephalic.  Eyes: Conjunctivae are normal.  Neck: Neck supple.  Cardiovascular: Normal rate, regular rhythm and normal heart sounds.   Pulmonary/Chest: Effort normal and breath sounds normal. No respiratory distress. She has no wheezes. She has no rales.  Abdominal: Soft. Bowel sounds are normal. She exhibits no distension. There is no tenderness. There is no rebound.  Right CVA tenderness  Musculoskeletal: She exhibits no edema.  Normal appearing back except for lumbar skoliosis. Tender to palpation to the subscapular muscles. No pain with ROM of right shoulder. No bruising swelling, erythema, rash.   Neurological: She is alert.  Skin: Skin is warm and dry.  Psychiatric: She has a normal mood and affect. Her behavior is normal.     ED Course  Procedures (including critical care time) Labs Review Labs Reviewed  URINALYSIS W MICROSCOPIC + REFLEX CULTURE - Abnormal; Notable for the following:    APPearance CLOUDY (*)    Bacteria, UA FEW (*)    Squamous Epithelial / LPF FEW (*)    All other components within normal limits   Imaging Review Dg Chest 2 View  11/07/2013   CLINICAL DATA:  Right shoulder pain with weakness.  EXAM: CHEST  2 VIEW  COMPARISON:  06/26/2013 and 11/28/2011.  FINDINGS: The heart size and mediastinal contours are stable. Epicardial pacing leads are unchanged. Right IJ Port-A-Cath tip is unchanged, near the SVC right atrial junction. There is a stable mild scoliosis. The lungs are clear. There is no pleural effusion or pneumothorax. The left glenoid appears hypoplastic.  IMPRESSION: Stable chest.  No active cardiopulmonary process.   Electronically Signed   By: Roxy Horseman [name]   On: 11/07/2013 17:52    EKG Interpretation   None       MDM   1. Upper back strain, initial encounter     Patient with right upper back pain for several days. Based on the exam pain is reproducible with palpation and most likely musculoskeletal. Chest x-ray and urine analysis obtained and are both negative. There are no signs of infection. Skin is normal. VS normal. PERC negative. Patient does not want to take any medications for this. Advised heating pads and stretches. Followup with her doctor.  Filed Vitals:   11/07/13 1614  BP: 126/75  Pulse: 88  Temp: 97.6 F (36.4 C)  TempSrc: Oral  Resp: 16  Weight: 95 lb (43.092 kg)  SpO2: 100%      Lottie Mussel, PA-C 11/07/13 2146

## 2013-11-07 NOTE — ED Provider Notes (Signed)
Medical screening examination/treatment/procedure(s) were performed by non-physician practitioner and as supervising physician I was immediately available for consultation/collaboration.  EKG Interpretation   None         Gwyneth Sprout, MD 11/07/13 2317

## 2013-11-11 ENCOUNTER — Emergency Department: Payer: Self-pay | Admitting: Emergency Medicine

## 2013-11-11 LAB — URINALYSIS, COMPLETE
Bilirubin,UR: NEGATIVE
Blood: NEGATIVE
Glucose,UR: NEGATIVE mg/dL (ref 0–75)
Nitrite: NEGATIVE
Ph: 5 (ref 4.5–8.0)
Protein: NEGATIVE
RBC,UR: <1 /HPF (ref 0–5)
Specific Gravity: 1.02 (ref 1.003–1.030)

## 2013-11-11 LAB — COMPREHENSIVE METABOLIC PANEL
Alkaline Phosphatase: 95 U/L
Anion Gap: 6 — ABNORMAL LOW (ref 7–16)
BUN: 17 mg/dL (ref 7–18)
Bilirubin,Total: 0.3 mg/dL (ref 0.2–1.0)
Co2: 29 mmol/L (ref 21–32)
EGFR (African American): >60
EGFR (Non-African Amer.): >60
Glucose: 96 mg/dL (ref 65–99)
Osmolality: 277 (ref 275–301)
SGOT(AST): 21 U/L (ref 15–37)
Total Protein: 7.3 g/dL (ref 6.4–8.2)

## 2013-11-17 ENCOUNTER — Emergency Department: Payer: Self-pay | Admitting: Emergency Medicine

## 2013-12-01 ENCOUNTER — Emergency Department (HOSPITAL_COMMUNITY)
Admission: EM | Admit: 2013-12-01 | Discharge: 2013-12-01 | Disposition: A | Discharge status: Home / Self Care | Payer: Medicaid Other | Attending: Emergency Medicine | Admitting: Emergency Medicine

## 2013-12-01 ENCOUNTER — Encounter (HOSPITAL_COMMUNITY): Payer: Self-pay | Admitting: Emergency Medicine

## 2013-12-01 DIAGNOSIS — Z79899 Other long term (current) drug therapy: Secondary | ICD-10-CM | POA: Insufficient documentation

## 2013-12-01 DIAGNOSIS — M25569 Pain in unspecified knee: Secondary | ICD-10-CM | POA: Insufficient documentation

## 2013-12-01 DIAGNOSIS — E876 Hypokalemia: Secondary | ICD-10-CM

## 2013-12-01 DIAGNOSIS — G8929 Other chronic pain: Secondary | ICD-10-CM | POA: Insufficient documentation

## 2013-12-01 LAB — CBC WITH DIFFERENTIAL/PLATELET
Basophils Relative: 0 % (ref 0–1)
Eosinophils Absolute: 0 10*3/uL (ref 0.0–0.7)
Hemoglobin: 13.3 g/dL (ref 12.0–15.0)
MCH: 28.9 pg (ref 26.0–34.0)
MCHC: 34.8 g/dL (ref 30.0–36.0)
Monocytes Relative: 5 % (ref 3–12)
Neutrophils Relative %: 71 % (ref 43–77)

## 2013-12-01 LAB — COMPREHENSIVE METABOLIC PANEL
Albumin: 4.4 g/dL (ref 3.5–5.2)
Alkaline Phosphatase: 107 U/L (ref 39–117)
BUN: 13 mg/dL (ref 6–23)
Creatinine, Ser: 0.76 mg/dL (ref 0.50–1.10)
Potassium: 3 mEq/L — ABNORMAL LOW (ref 3.5–5.1)
Total Protein: 8.1 g/dL (ref 6.0–8.3)

## 2013-12-01 MED ORDER — MAGNESIUM SULFATE 40 MG/ML IJ SOLN
2.0000 g | Freq: Once | INTRAMUSCULAR | Status: AC
  Administered 2013-12-01: 2 g via INTRAVENOUS
  Filled 2013-12-01: qty 50

## 2013-12-01 MED ORDER — MAGNESIUM SULFATE 40 MG/ML IJ SOLN: 2.0000 g | Freq: Once | INTRAMUSCULAR | Status: DC

## 2013-12-01 MED ORDER — POTASSIUM CHLORIDE CRYS ER 20 MEQ PO TBCR
40.0000 meq | EXTENDED_RELEASE_TABLET | Freq: Once | ORAL | Status: AC
  Administered 2013-12-01: 40 meq via ORAL
  Filled 2013-12-01: qty 2

## 2013-12-01 NOTE — ED Provider Notes (Signed)
CSN: [id number]     Arrival date & time 12/01/13  1456 History   First MD Initiated Contact with Patient 12/01/13 1511     Chief Complaint  Patient presents with  . Headache  . Knee Pain   (Consider location/radiation/quality/duration/timing/severity/associated sxs/prior Treatment) Patient is a 32 y.o. female presenting with headaches and knee pain.  Headache Pain location:  Generalized Radiates to:  Does not radiate Onset quality:  Gradual Timing:  Constant Similar to prior headaches: yes   Relieved by:  Nothing Worsened by:  Nothing tried Risk factors: no anger   Knee Pain Location:  Knee Time since incident:  2 months Knee location:  R knee Pain details:    Quality:  Aching Chronicity:  Chronic Dislocation: no   Relieved by:  Nothing Ineffective treatments:  None tried Pt reports she gets headaches when her magnesium is low,   Pt has a history of gitelman syndrome.  (hx or low k and low mag)  Pt has had multiple knee surgerys.  Orthopaedist at [facility].  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  (Pt has Cp and crawls on knees)  Past Medical History  Diagnosis Date  . Cerebral palsy   . Hypokalemia     now resolved  . 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  . Gitelman syndrome   . Chronic back pain   . Carpal tunnel syndrome    Past Surgical History  Procedure Laterality Date  . Pacemaker insertion    . Orthopedic surgery    . Abdominal hysterectomy    . Portacath placement     Family History  Problem Relation Age of Onset  . Diabetes Mother   . Hypertension Mother    History  Substance Use Topics  . Smoking status: Never Smoker   . Smokeless tobacco: Not on file  . Alcohol Use: No   OB History   Grav Para Term Preterm Abortions TAB SAB Ect Mult Living                 Review of Systems  Neurological: Positive for headaches.  All other systems reviewed and are negative.    Allergies  Cymbalta; Gabapentin;  Prozac; Sulfa antibiotics; and Ciprofloxacin  Home Medications   Current Outpatient Rx  Name  Route  Sig  Dispense  Refill  . dextrose 5 % SOLN 50 mL with magnesium sulfate 50 % SOLN 500 mg   Intravenous   Inject 2 mg into the vein once.         Marland Kitchen EPINEPHrine (EPIPEN) 0.3 mg/0.3 mL SOAJ injection   Intramuscular   Inject 0.3 mg into the muscle once.         . magnesium oxide (MAG-OX) 400 MG tablet   Oral   Take 400 mg by mouth 2 (two) times daily.          . methylphenidate (RITALIN) 20 MG tablet   Oral   Take 20 mg by mouth.          . pregabalin (LYRICA) 50 MG capsule   Oral   Take 50 mg by mouth 3 (three) times daily.           BP 126/78  Pulse 88  Temp(Src) 97.8 F (36.6 C) (Oral)  Resp 17  SpO2 100%  LMP 06/09/2012 Physical Exam  Nursing note and vitals reviewed. Constitutional: She is oriented to person, place, and time. She appears well-developed and well-nourished.  HENT:  Head:  Normocephalic.  Right Ear: External ear normal.  Left Ear: External ear normal.  Nose: Nose normal.  Eyes: Conjunctivae and EOM are normal. Pupils are equal, round, and reactive to light.  Neck: Normal range of motion. Neck supple.  Cardiovascular: Normal rate and normal heart sounds.   Pulmonary/Chest: Effort normal and breath sounds normal.  Abdominal: Soft.  Musculoskeletal: Normal range of motion.  Neurological: She is alert and oriented to person, place, and time. She has normal reflexes.  Skin: Skin is warm.  Psychiatric: She has a normal mood and affect.    ED Course  Procedures (including critical care time) Labs Review Labs Reviewed  MAGNESIUM  CBC WITH DIFFERENTIAL  COMPREHENSIVE METABOLIC PANEL   Imaging Review No results found.  EKG Interpretation   None      Pt has recommendations from her MD in [place] advising that she have 2 grams of magnesium Iv and Iv fluids.   Pt given here.   Pt refused knee splinting including ace wrap.  Pt advised  to see her Orthopaedist.   MDM   1. Hypomagnesemia   2. Hypokalemia        Elson Areas, PA-C 12/01/13 2102  Elson Areas, PA-C 12/01/13 2103  Lonia Skinner Anmoore, PA-C 12/01/13 2103

## 2013-12-01 NOTE — ED Notes (Signed)
Pt from home c/o headache and R knee pain. Pt states that she thinks her "mag is low because my head hurts." Pt had R knee surgery approx 2 months ago. Pt has swelling and redness, requests that R knee be evaluated. Pt is A&O and in NAD

## 2013-12-02 NOTE — ED Provider Notes (Signed)
Medical screening examination/treatment/procedure(s) were performed by non-physician practitioner and as supervising physician I was immediately available for consultation/collaboration.  Flint Melter, MD 12/02/13 [phone]

## 2013-12-14 ENCOUNTER — Emergency Department: Payer: Self-pay | Admitting: Emergency Medicine

## 2013-12-14 LAB — CBC WITH DIFFERENTIAL/PLATELET
Basophil #: 0 10*3/uL (ref 0.0–0.1)
Basophil %: 0.4 %
Eosinophil #: 0 10*3/uL (ref 0.0–0.7)
Eosinophil %: 0.1 %
HCT: 37.1 % (ref 35.0–47.0)
HGB: 12.5 g/dL (ref 12.0–16.0)
Lymphocyte #: 2 10*3/uL (ref 1.0–3.6)
Lymphocyte %: 17.5 %
MCH: 28 pg (ref 26.0–34.0)
MCHC: 33.6 g/dL (ref 32.0–36.0)
MCV: 83 fL (ref 80–100)
Monocyte #: 0.5 x10 3/mm (ref 0.2–0.9)
Monocyte %: 4.7 %
NEUTROS ABS: 8.8 10*3/uL — AB (ref 1.4–6.5)
NEUTROS PCT: 77.3 %
Platelet: 193 10*3/uL (ref 150–440)
RBC: 4.46 10*6/uL (ref 3.80–5.20)
RDW: 13.4 % (ref 11.5–14.5)
WBC: 11.3 10*3/uL — AB (ref 3.6–11.0)

## 2013-12-14 LAB — URINALYSIS, COMPLETE
BLOOD: NEGATIVE
Bilirubin,UR: NEGATIVE
Glucose,UR: NEGATIVE mg/dL (ref 0–75)
Ketone: NEGATIVE
LEUKOCYTE ESTERASE: NEGATIVE
Nitrite: NEGATIVE
PROTEIN: NEGATIVE
Ph: 5 (ref 4.5–8.0)
RBC,UR: 1 /HPF (ref 0–5)
Specific Gravity: 1.015 (ref 1.003–1.030)

## 2013-12-14 LAB — BASIC METABOLIC PANEL
Anion Gap: 8 (ref 7–16)
BUN: 13 mg/dL (ref 7–18)
CO2: 26 mmol/L (ref 21–32)
Calcium, Total: 9.4 mg/dL (ref 8.5–10.1)
Chloride: 103 mmol/L (ref 98–107)
Creatinine: 0.76 mg/dL (ref 0.60–1.30)
EGFR (Non-African Amer.): >60
GLUCOSE: 86 mg/dL (ref 65–99)
Osmolality: 273 (ref 275–301)
Potassium: 3.3 mmol/L — ABNORMAL LOW (ref 3.5–5.1)
SODIUM: 137 mmol/L (ref 136–145)

## 2013-12-14 LAB — MAGNESIUM: MAGNESIUM: 0.7 mg/dL — AB

## 2013-12-14 LAB — PRO B NATRIURETIC PEPTIDE: B-TYPE NATIURETIC PEPTID: 45 pg/mL (ref 0–125)

## 2013-12-18 ENCOUNTER — Emergency Department: Payer: Self-pay | Admitting: Emergency Medicine

## 2013-12-18 LAB — CBC WITH DIFFERENTIAL/PLATELET
BASOS ABS: 0.1 10*3/uL (ref 0.0–0.1)
BASOS PCT: 0.5 %
EOS ABS: 0 10*3/uL (ref 0.0–0.7)
Eosinophil %: 0.1 %
HCT: 38 % (ref 35.0–47.0)
HGB: 12.9 g/dL (ref 12.0–16.0)
LYMPHS PCT: 17.1 %
Lymphocyte #: 1.8 10*3/uL (ref 1.0–3.6)
MCH: 28.4 pg (ref 26.0–34.0)
MCHC: 34 g/dL (ref 32.0–36.0)
MCV: 83 fL (ref 80–100)
MONOS PCT: 5.1 %
Monocyte #: 0.5 x10 3/mm (ref 0.2–0.9)
NEUTROS ABS: 8.2 10*3/uL — AB (ref 1.4–6.5)
Neutrophil %: 77.2 %
Platelet: 162 10*3/uL (ref 150–440)
RBC: 4.56 10*6/uL (ref 3.80–5.20)
RDW: 13.9 % (ref 11.5–14.5)
WBC: 10.7 10*3/uL (ref 3.6–11.0)

## 2013-12-18 LAB — BASIC METABOLIC PANEL
Anion Gap: 7 (ref 7–16)
BUN: 11 mg/dL (ref 7–18)
Calcium, Total: 8.7 mg/dL (ref 8.5–10.1)
Chloride: 101 mmol/L (ref 98–107)
Co2: 27 mmol/L (ref 21–32)
Creatinine: 0.73 mg/dL (ref 0.60–1.30)
EGFR (African American): >60
EGFR (Non-African Amer.): >60
GLUCOSE: 80 mg/dL (ref 65–99)
OSMOLALITY: 268 (ref 275–301)
Potassium: 3.1 mmol/L — ABNORMAL LOW (ref 3.5–5.1)
SODIUM: 135 mmol/L — AB (ref 136–145)

## 2013-12-18 LAB — MAGNESIUM: Magnesium: 0.8 mg/dL — ABNORMAL LOW

## 2013-12-22 ENCOUNTER — Emergency Department: Payer: Self-pay | Admitting: Emergency Medicine

## 2013-12-22 LAB — CBC
HCT: 39.3 % (ref 35.0–47.0)
HGB: 13.4 g/dL (ref 12.0–16.0)
MCH: 28.4 pg (ref 26.0–34.0)
MCHC: 34 g/dL (ref 32.0–36.0)
MCV: 83 fL (ref 80–100)
Platelet: 167 10*3/uL (ref 150–440)
RBC: 4.71 10*6/uL (ref 3.80–5.20)
RDW: 14 % (ref 11.5–14.5)
WBC: 10.2 10*3/uL (ref 3.6–11.0)

## 2013-12-22 LAB — BASIC METABOLIC PANEL
Anion Gap: 6 — ABNORMAL LOW (ref 7–16)
BUN: 10 mg/dL (ref 7–18)
CALCIUM: 8.9 mg/dL (ref 8.5–10.1)
Chloride: 103 mmol/L (ref 98–107)
Co2: 30 mmol/L (ref 21–32)
Creatinine: 0.81 mg/dL (ref 0.60–1.30)
EGFR (Non-African Amer.): >60
Glucose: 81 mg/dL (ref 65–99)
Osmolality: 276 (ref 275–301)
Potassium: 3.4 mmol/L — ABNORMAL LOW (ref 3.5–5.1)
Sodium: 139 mmol/L (ref 136–145)

## 2013-12-22 LAB — TROPONIN I: Troponin-I: <0.02 ng/mL

## 2013-12-22 LAB — MAGNESIUM: MAGNESIUM: 0.8 mg/dL — AB

## 2013-12-26 ENCOUNTER — Emergency Department: Payer: Self-pay | Admitting: Emergency Medicine

## 2013-12-26 LAB — MAGNESIUM: Magnesium: 0.8 mg/dL — ABNORMAL LOW

## 2013-12-27 ENCOUNTER — Emergency Department: Payer: Self-pay | Admitting: Emergency Medicine

## 2014-01-03 ENCOUNTER — Emergency Department: Payer: Self-pay | Admitting: Emergency Medicine

## 2014-01-03 LAB — COMPREHENSIVE METABOLIC PANEL
Albumin: 3.9 g/dL (ref 3.4–5.0)
Alkaline Phosphatase: 94 U/L
Anion Gap: 6 — ABNORMAL LOW (ref 7–16)
BILIRUBIN TOTAL: 0.5 mg/dL (ref 0.2–1.0)
BUN: 12 mg/dL (ref 7–18)
CHLORIDE: 105 mmol/L (ref 98–107)
CO2: 28 mmol/L (ref 21–32)
CREATININE: 0.71 mg/dL (ref 0.60–1.30)
Calcium, Total: 8.9 mg/dL (ref 8.5–10.1)
EGFR (Non-African Amer.): >60
Glucose: 90 mg/dL (ref 65–99)
OSMOLALITY: 277 (ref 275–301)
Potassium: 3.6 mmol/L (ref 3.5–5.1)
SGOT(AST): 42 U/L — ABNORMAL HIGH (ref 15–37)
SGPT (ALT): 43 U/L (ref 12–78)
Sodium: 139 mmol/L (ref 136–145)
TOTAL PROTEIN: 7.3 g/dL (ref 6.4–8.2)

## 2014-01-03 LAB — MAGNESIUM: Magnesium: 0.8 mg/dL — ABNORMAL LOW

## 2014-01-06 ENCOUNTER — Encounter (HOSPITAL_COMMUNITY): Payer: Self-pay | Admitting: Emergency Medicine

## 2014-01-06 ENCOUNTER — Emergency Department (HOSPITAL_COMMUNITY): Payer: Medicaid Other

## 2014-01-06 ENCOUNTER — Emergency Department (HOSPITAL_COMMUNITY)
Admission: EM | Admit: 2014-01-06 | Discharge: 2014-01-06 | Disposition: A | Discharge status: Home / Self Care | Payer: Medicaid Other | Attending: Emergency Medicine | Admitting: Emergency Medicine

## 2014-01-06 DIAGNOSIS — Z950 Presence of cardiac pacemaker: Secondary | ICD-10-CM | POA: Insufficient documentation

## 2014-01-06 DIAGNOSIS — Z862 Personal history of diseases of the blood and blood-forming organs and certain disorders involving the immune mechanism: Secondary | ICD-10-CM | POA: Insufficient documentation

## 2014-01-06 DIAGNOSIS — G809 Cerebral palsy, unspecified: Secondary | ICD-10-CM | POA: Insufficient documentation

## 2014-01-06 DIAGNOSIS — Z8679 Personal history of other diseases of the circulatory system: Secondary | ICD-10-CM | POA: Insufficient documentation

## 2014-01-06 DIAGNOSIS — Z9889 Other specified postprocedural states: Secondary | ICD-10-CM | POA: Insufficient documentation

## 2014-01-06 DIAGNOSIS — M25559 Pain in unspecified hip: Secondary | ICD-10-CM | POA: Insufficient documentation

## 2014-01-06 DIAGNOSIS — M549 Dorsalgia, unspecified: Secondary | ICD-10-CM | POA: Insufficient documentation

## 2014-01-06 DIAGNOSIS — G8929 Other chronic pain: Secondary | ICD-10-CM | POA: Insufficient documentation

## 2014-01-06 DIAGNOSIS — Z79899 Other long term (current) drug therapy: Secondary | ICD-10-CM | POA: Insufficient documentation

## 2014-01-06 DIAGNOSIS — Z8639 Personal history of other endocrine, nutritional and metabolic disease: Secondary | ICD-10-CM | POA: Insufficient documentation

## 2014-01-06 NOTE — ED Provider Notes (Signed)
CSN: [id number]     Arrival date & time 01/06/14  1646 History  This chart was scribed for non-physician practitioner, Arthor CaptainAbigail Claudie Rathbone, PA-C working with Junius ArgyleForrest S Harrison, MD by Greggory StallionKayla Andersen, ED scribe. This patient was seen in room WTR7/WTR7 and the patient's care was started at 5:40 PM.   Chief Complaint  Patient presents with  . Hip Pain   The history is provided by the patient. No language interpreter was used.   HPI Comments: Harlon Flormily L Matteucci is a 33 y.o. female with history of cerebral palsy who presents to the Emergency Department complaining of gradual onset, constant left hip pain that started 6 months ago. The pain has worsened over the last several days. She has received a shot in her hip by a sports medicine doctor with little relief. Pt has taken celebrex and lyrica with little relief. She states she can ambulate but due to her chronic knee pain, she hasn't been walking as often. Pt had a CT scan of her hip when the pain first started but nothing significant was shown.   Past Medical History  Diagnosis Date  . Cerebral palsy   . Hypokalemia     now resolved  . 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  . Gitelman syndrome   . Chronic back pain   . Carpal tunnel syndrome    Past Surgical History  Procedure Laterality Date  . Pacemaker insertion    . Orthopedic surgery    . Abdominal hysterectomy    . Portacath placement     Family History  Problem Relation Age of Onset  . Diabetes Mother   . Hypertension Mother    History  Substance Use Topics  . Smoking status: Never Smoker   . Smokeless tobacco: Not on file  . Alcohol Use: No   OB History   Grav Para Term Preterm Abortions TAB SAB Ect Mult Living                 Review of Systems  Constitutional: Negative for fever.  HENT: Negative for congestion.   Eyes: Negative for redness.  Respiratory: Negative for shortness of breath.   Cardiovascular: Negative for chest pain.   Gastrointestinal: Negative for abdominal pain.  Musculoskeletal: Positive for arthralgias.  Skin: Negative for rash.  Neurological: Negative for numbness.  Psychiatric/Behavioral: Negative for confusion.    Allergies  Cymbalta; Gabapentin; Prozac; Sulfa antibiotics; and Ciprofloxacin  Home Medications   Current Outpatient Rx  Name  Route  Sig  Dispense  Refill  . dextrose 5 % SOLN 50 mL with magnesium sulfate 50 % SOLN 500 mg   Intravenous   Inject 2 mg into the vein See admin instructions. Gets infusion twice weekly. Last done on Wed 11/28/13         . magnesium oxide (MAG-OX) 400 MG tablet   Oral   Take 400 mg by mouth 2 (two) times daily.          . methylphenidate (RITALIN) 20 MG tablet   Oral   Take 20 mg by mouth.          . pregabalin (LYRICA) 50 MG capsule   Oral   Take 50 mg by mouth 3 (three) times daily.           BP 130/96  Pulse 86  Temp(Src) 98.3 F (36.8 C) (Oral)  Resp 18  SpO2 100%  LMP 06/09/2012  Physical Exam  Nursing note and vitals reviewed. Constitutional:  She is oriented to person, place, and time. She appears well-developed and well-nourished. No distress.  HENT:  Head: Normocephalic and atraumatic.  Eyes: EOM are normal.  Neck: Neck supple. No tracheal deviation present.  Cardiovascular: Normal rate.   Pulmonary/Chest: Effort normal. No respiratory distress.  Musculoskeletal: Normal range of motion.  Tender to palpation of left hip. Left knee has swollen and inflamed bursa with multiple surgical scars.   Neurological: She is alert and oriented to person, place, and time.  Skin: Skin is warm and dry.  Psychiatric: She has a normal mood and affect. Her behavior is normal.    ED Course  Procedures (including critical care time)  DIAGNOSTIC STUDIES: Oxygen Saturation is 100% on RA, normal by my interpretation.    COORDINATION OF CARE: 5:49 PM-Discussed treatment plan which includes xray with pt at bedside and pt agreed to  plan.   Labs Review Labs Reviewed - No data to display Imaging Review No results found.  EKG Interpretation   None       MDM   1. Hip pain    X ray negative. Patient is on celebrex at home. She does not like to use narcotic medications. Patient is currently using wheel chair due to R knee prepatellar bursitis.  No signs of active infection.Patient is advised to follow up with ortho. The patient appears reasonably screened and/or stabilized for discharge and I doubt any other medical condition or other [facility] requiring further screening, evaluation, or treatment in the ED at this time prior to discharge.   I personally performed the services described in this documentation, which was scribed in my presence. The recorded information has been reviewed and is accurate.    Arthor Captain, PA-C 01/07/14 2134

## 2014-01-06 NOTE — ED Notes (Signed)
MD at bedside. 

## 2014-01-06 NOTE — ED Notes (Signed)
Pt states that she has had lt hip pain x 2 months.  Takes celebrex for it.

## 2014-01-06 NOTE — Discharge Instructions (Signed)
Hip Pain  The hips join the upper legs to the lower pelvis. The bones, cartilage, tendons, and muscles of the hip joint perform a lot of work each day holding your body weight and allowing you to move around.  Hip pain is a common symptom. It can range from a minor ache to severe pain on 1 or both hips. Pain may be felt on the inside of the hip joint near the groin, or the outside near the buttocks and upper thigh. There may be swelling or stiffness as well. It occurs more often when a person walks or performs activity. There are many reasons hip pain can develop.  CAUSES   It is important to work with your caregiver to identify the cause since many conditions can impact the bones, cartilage, muscles, and tendons of the hips. Causes for hip pain include:   Broken (fractured) bones.   Separation of the thighbone from the hip socket (dislocation).   Torn cartilage of the hip joint.   Swelling (inflammation) of a tendon (tendonitis), the sac within the hip joint (bursitis), or a joint.   A weakening in the abdominal wall (hernia), affecting the nerves to the hip.   Arthritis in the hip joint or lining of the hip joint.   Pinched nerves in the back, hip, or upper thigh.   A bulging disc in the spine (herniated disc).   Rarely, bone infection or cancer.  DIAGNOSIS   The location of your hip pain will help your caregiver understand what may be causing the pain. A diagnosis is based on your medical history, your symptoms, results from your physical exam, and results from diagnostic tests. Diagnostic tests may include X-ray exams, a computerized magnetic scan (magnetic resonance imaging, MRI), or bone scan.  TREATMENT   Treatment will depend on the cause of your hip pain. Treatment may include:   Limiting activities and resting until symptoms improve.   Crutches or other walking supports (a cane or brace).   Ice, elevation, and compression.   Physical therapy or home exercises.   Shoe inserts or special  shoes.   Losing weight.   Medications to reduce pain.   Undergoing surgery.  HOME CARE INSTRUCTIONS    Only take over-the-counter or prescription medicines for pain, discomfort, or fever as directed by your caregiver.   Put ice on the injured area:   Put ice in a plastic bag.   Place a towel between your skin and the bag.   Leave the ice on for 15-20 minutes at a time, 03-04 times a day.   Keep your leg raised (elevated) when possible to lessen swelling.   Avoid activities that cause pain.   Follow specific exercises as directed by your caregiver.   Sleep with a pillow between your legs on your most comfortable side.   Record how often you have hip pain, the location of the pain, and what it feels like. This information may be helpful to you and your caregiver.   Ask your caregiver about returning to work or sports and whether you should drive.   Follow up with your caregiver for further exams, therapy, or testing as directed.  SEEK MEDICAL CARE IF:    Your pain or swelling continues or worsens after 1 week.   You are feeling unwell or have chills.   You have increasing difficulty with walking.   You have a loss of sensation or other new symptoms.   You have questions or concerns.  SEEK   IMMEDIATE MEDICAL CARE IF:    You cannot put weight on the affected hip.   You have fallen.   You have a sudden increase in pain and swelling in your hip.   You have a fever.  MAKE SURE YOU:    Understand these instructions.   Will watch your condition.   Will get help right away if you are not doing well or get worse.  Document Released: 05/19/2010 Document Revised: 02/21/2012 Document Reviewed: 05/19/2010  Exit[employer] Patient Information 2014 [employer].

## 2014-01-08 NOTE — ED Provider Notes (Signed)
Medical screening examination/treatment/procedure(s) were performed by non-physician practitioner and as supervising physician I was immediately available for consultation/collaboration.  EKG Interpretation   None         Junius ArgyleForrest S Erikka Follmer, MD 01/08/14 1128

## 2014-01-09 ENCOUNTER — Emergency Department: Payer: Self-pay | Admitting: Emergency Medicine

## 2014-01-09 LAB — MAGNESIUM: Magnesium: 0.6 mg/dL — ABNORMAL LOW

## 2014-01-12 ENCOUNTER — Emergency Department: Payer: Self-pay | Admitting: Emergency Medicine

## 2014-01-12 LAB — BASIC METABOLIC PANEL
ANION GAP: 4 — AB (ref 7–16)
BUN: 17 mg/dL (ref 7–18)
CHLORIDE: 105 mmol/L (ref 98–107)
Calcium, Total: 9.2 mg/dL (ref 8.5–10.1)
Co2: 29 mmol/L (ref 21–32)
Creatinine: 0.78 mg/dL (ref 0.60–1.30)
EGFR (African American): >60
EGFR (Non-African Amer.): >60
GLUCOSE: 97 mg/dL (ref 65–99)
Osmolality: 277 (ref 275–301)
POTASSIUM: 3.6 mmol/L (ref 3.5–5.1)
SODIUM: 138 mmol/L (ref 136–145)

## 2014-01-12 LAB — MAGNESIUM: Magnesium: 0.9 mg/dL — ABNORMAL LOW

## 2014-01-18 ENCOUNTER — Encounter (HOSPITAL_COMMUNITY): Payer: Self-pay | Admitting: Emergency Medicine

## 2014-01-18 ENCOUNTER — Emergency Department (HOSPITAL_COMMUNITY)
Admission: EM | Admit: 2014-01-18 | Discharge: 2014-01-18 | Disposition: A | Discharge status: Home / Self Care | Payer: Medicaid Other | Attending: Emergency Medicine | Admitting: Emergency Medicine

## 2014-01-18 DIAGNOSIS — H538 Other visual disturbances: Secondary | ICD-10-CM | POA: Insufficient documentation

## 2014-01-18 DIAGNOSIS — R51 Headache: Secondary | ICD-10-CM | POA: Insufficient documentation

## 2014-01-18 DIAGNOSIS — E876 Hypokalemia: Secondary | ICD-10-CM

## 2014-01-18 DIAGNOSIS — Z8679 Personal history of other diseases of the circulatory system: Secondary | ICD-10-CM | POA: Insufficient documentation

## 2014-01-18 DIAGNOSIS — Z79899 Other long term (current) drug therapy: Secondary | ICD-10-CM | POA: Insufficient documentation

## 2014-01-18 DIAGNOSIS — Z791 Long term (current) use of non-steroidal anti-inflammatories (NSAID): Secondary | ICD-10-CM | POA: Insufficient documentation

## 2014-01-18 DIAGNOSIS — R109 Unspecified abdominal pain: Secondary | ICD-10-CM | POA: Insufficient documentation

## 2014-01-18 DIAGNOSIS — G8929 Other chronic pain: Secondary | ICD-10-CM | POA: Insufficient documentation

## 2014-01-18 LAB — BASIC METABOLIC PANEL
BUN: 17 mg/dL (ref 6–23)
CALCIUM: 9.3 mg/dL (ref 8.4–10.5)
CO2: 29 mEq/L (ref 19–32)
Chloride: 100 mEq/L (ref 96–112)
Creatinine, Ser: 0.74 mg/dL (ref 0.50–1.10)
Glucose, Bld: 99 mg/dL (ref 70–99)
POTASSIUM: 3.2 meq/L — AB (ref 3.7–5.3)
SODIUM: 141 meq/L (ref 137–147)

## 2014-01-18 LAB — CBC
HCT: 39.6 % (ref 36.0–46.0)
Hemoglobin: 13.6 g/dL (ref 12.0–15.0)
MCH: 28.7 pg (ref 26.0–34.0)
MCHC: 34.3 g/dL (ref 30.0–36.0)
MCV: 83.5 fL (ref 78.0–100.0)
PLATELETS: 190 10*3/uL (ref 150–400)
RBC: 4.74 MIL/uL (ref 3.87–5.11)
RDW: 14 % (ref 11.5–15.5)
WBC: 10.1 10*3/uL (ref 4.0–10.5)

## 2014-01-18 LAB — MAGNESIUM: MAGNESIUM: 1.1 mg/dL — AB (ref 1.5–2.5)

## 2014-01-18 MED ORDER — SODIUM CHLORIDE 0.9 % IV BOLUS (SEPSIS): 1000.0000 mL | Freq: Once | INTRAVENOUS | Status: DC

## 2014-01-18 MED ORDER — POTASSIUM CHLORIDE 20 MEQ/15ML (10%) PO LIQD
40.0000 meq | Freq: Once | ORAL | Status: DC
  Filled 2014-01-18: qty 30

## 2014-01-18 MED ORDER — MAGNESIUM SULFATE 40 MG/ML IJ SOLN
2.0000 g | INTRAMUSCULAR | Status: AC
  Administered 2014-01-18: 2 g via INTRAVENOUS
  Filled 2014-01-18: qty 50

## 2014-01-18 NOTE — ED Notes (Signed)
Pt left AMA with mother. Pt refused potassium and magnesium. Pt stated she did not want to receive Magnesium IV piggyback. When ask why, could not give reason. Stated "I've never received it piggyback like that, and I don't like it". Explained that per [facility] policy IV medications should be ran with fluids. Pt stated "I've never heard of something so stupid before in my life". Ripped out IV. Left in wheelchair. Refused to sign AMA paperwork. Stated that she was going to [facility].

## 2014-01-18 NOTE — ED Notes (Signed)
Pt states that she would her magnesium checked because she things it might be low, she says she usually see "stars" when it is low. She reports dizziness, denies pain, n/v/d.

## 2014-01-18 NOTE — ED Provider Notes (Signed)
CSN: [id number]     Arrival date & time 01/18/14  1452 History   First MD Initiated Contact with Patient 01/18/14 1547     Chief Complaint  Patient presents with  . Magnesium lab check    (Consider location/radiation/quality/duration/timing/severity/associated sxs/prior Treatment) The history is provided by the patient and medical records. No language interpreter was used.    [name] is a 33 y.o. female  with a hx of CP, Gitelman syndrome, chronic pain and POTS (s/p pacemaker) presents to the Emergency Department complaining of gradual, persistent, progressively worsening "seeing stars" onset several days ago.  Pt reports this is always the case when her Mg is low.  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  Pt has been discharged from several infusion sites and MD offices in the [place], [place], [place] and [place] due to noncompliance issues.   Associated symptoms include mild, generalized headaches.  Mother reports they have been seen at [facility] this [facility] for Magnesium infusions, but do not know why her Mg levels are low.  Mother reports pt is a poor eater and she thinks this is the issue. Nothing makes it better or worse.  Pt denies fever, chills, neck pain, neck stiffness, CP, SOB, N/V/D, weakness, dizziness, syncope, dysuria.  Pt endorses abd pain on ROS, but reports this is chronic.  Mother reports h/o ovarian cyst and that she has close follow-up with her OB/GYN.    Past Medical History  Diagnosis Date  . Cerebral palsy   . Hypokalemia     now resolved  . 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  . Gitelman syndrome   . Chronic back pain   . Carpal tunnel syndrome    Past Surgical History  Procedure Laterality Date  . Pacemaker insertion    . Orthopedic surgery    . Abdominal hysterectomy    . Portacath placement     Family History   Problem Relation Age of Onset  . Diabetes Mother   . Hypertension Mother    History  Substance Use Topics  . Smoking status: Never Smoker   . Smokeless tobacco: Not on file  . Alcohol Use: No   OB History   Grav Para Term Preterm Abortions TAB SAB Ect Mult Living                 Review of Systems  Constitutional: Negative for fever, diaphoresis, appetite change, fatigue and unexpected weight change.  HENT: Negative for mouth sores.   Eyes: Positive for visual disturbance.  Respiratory: Negative for cough, chest tightness, shortness of breath and wheezing.   Cardiovascular: Negative for chest pain.  Gastrointestinal: Positive for abdominal pain (chronic). Negative for nausea, vomiting, diarrhea and constipation.  Endocrine: Negative for polydipsia, polyphagia and polyuria.  Genitourinary: Negative for dysuria, urgency, frequency and hematuria.  Musculoskeletal: Negative for back pain and neck stiffness.  Skin: Negative for rash.  Allergic/Immunologic: Negative for immunocompromised state.  Neurological: Positive for headaches (mild, generalized). Negative for syncope and light-headedness.  Hematological: Does not bruise/bleed easily.  Psychiatric/Behavioral: Negative for sleep disturbance. The patient is not nervous/anxious.     Allergies  Cymbalta; Gabapentin; Prozac; Sulfa antibiotics; and Ciprofloxacin  Home Medications   Current Outpatient Rx  Name  Route  Sig  Dispense  Refill  . celecoxib (CELEBREX) 200 MG capsule   Oral   Take 200 mg by mouth daily.         .Marland Kitchen  magnesium oxide (MAG-OX) 400 MG tablet   Oral   Take 400 mg by mouth 2 (two) times daily.          . methylphenidate (RITALIN) 20 MG tablet   Oral   Take 20 mg by mouth.          . pregabalin (LYRICA) 50 MG capsule   Oral   Take 50 mg by mouth 3 (three) times daily.           BP 114/82  Pulse 85  Temp(Src) 98.8 F (37.1 C) (Oral)  Resp 22  Ht 5\' 2"  (1.575 m)  Wt 95 lb (43.092 kg)   BMI 17.37 kg/m2  SpO2 100%  LMP 06/09/2012 Physical Exam  Nursing note and vitals reviewed. Constitutional: She is oriented to person, place, and time. She appears well-developed and well-nourished. No distress.  Awake, alert, nontoxic appearance  HENT:  Head: Normocephalic and atraumatic.  Mouth/Throat: Oropharynx is clear and moist. No oropharyngeal exudate.  Eyes: Conjunctivae are normal. No scleral icterus.  Neck: Normal range of motion. Neck supple.  Cardiovascular: Normal rate, regular rhythm, normal heart sounds and intact distal pulses.   No murmur heard. RRR  Pulmonary/Chest: Effort normal and breath sounds normal. No respiratory distress. She has no wheezes.  Clear and equal  Abdominal: Soft. Bowel sounds are normal. She exhibits no distension and no mass. There is no tenderness. There is no rebound and no guarding.  Soft and nontender nondistended  Musculoskeletal: Normal range of motion. She exhibits no edema.  Full ROM of arms and legs  Neurological: She is alert and oriented to person, place, and time.  Skin: Skin is warm and dry. She is not diaphoretic. No erythema.  Psychiatric: She has a normal mood and affect.    ED Course  Procedures (including critical care time) Labs Review Labs Reviewed  BASIC METABOLIC PANEL - Abnormal; Notable for the following:    Potassium 3.2 (*)    All other components within normal limits  MAGNESIUM - Abnormal; Notable for the following:    Magnesium 1.1 (*)    All other components within normal limits  CBC   Imaging Review No results found.  EKG Interpretation   None       MDM   1. Hypomagnesemia   2. Hypokalemia      Caitlyn Flor presents with multiple medical problems with concerns that her Mg is low.  Pt with hx of same and symptoms of same.  Pt with Hx of CP.  She sits in a wheelchair throughout exam and mother reports she is sitting in her wheelchair more often.  Pt alert and oriented, interactive to  baseline and providing Hx.  Will check labs.    5:30 PM CBC unremarkable, hypokalemia at 3.2 and hypomagnesemia at 1.1  Will give MgSO4 2g via IV and Potassium PO.    6:02 PM Pt MgSO4 and fluids ordered and begun as requested.  Pt then became irate with the RN reporting that she would not take the potassium and she will not have her Magnesium run with fluids (though this is what she initially requested).  Patient and mother unable to tell are in what is that they do want done.  Patient and mother left AMA prior to my ability to discuss with them the risk versus benefit of leaving.    BP 114/82  Pulse 85  Temp(Src) 98.8 F (37.1 C) (Oral)  Resp 22  Ht 5\' 2"  (1.575 m)  Wt  95 lb (43.092 kg)  BMI 17.37 kg/m2  SpO2 100%  LMP 06/09/2012   Record review shows pt has a hx of leaving AMA for various conditions in the past.  Pt appears to have capacity to make this decision, but pt is here with her mother and legal guardian who has competency and capacity to make this decision.      Dahlia Client Aalaiyah Yassin, PA-C 01/18/14 [phone]

## 2014-01-19 ENCOUNTER — Emergency Department: Payer: Self-pay | Admitting: Emergency Medicine

## 2014-01-19 LAB — CBC WITH DIFFERENTIAL/PLATELET
BASOS ABS: 0.1 10*3/uL (ref 0.0–0.1)
Basophil %: 0.5 %
Eosinophil #: 0 10*3/uL (ref 0.0–0.7)
Eosinophil %: 0.1 %
HCT: 42.6 % (ref 35.0–47.0)
HGB: 14.4 g/dL (ref 12.0–16.0)
LYMPHS ABS: 2.3 10*3/uL (ref 1.0–3.6)
Lymphocyte %: 19.1 %
MCH: 28.8 pg (ref 26.0–34.0)
MCHC: 33.7 g/dL (ref 32.0–36.0)
MCV: 86 fL (ref 80–100)
Monocyte #: 0.8 x10 3/mm (ref 0.2–0.9)
Monocyte %: 6.4 %
NEUTROS ABS: 9.1 10*3/uL — AB (ref 1.4–6.5)
Neutrophil %: 73.9 %
Platelet: 185 10*3/uL (ref 150–440)
RBC: 4.98 10*6/uL (ref 3.80–5.20)
RDW: 15 % — AB (ref 11.5–14.5)
WBC: 12.3 10*3/uL — AB (ref 3.6–11.0)

## 2014-01-19 LAB — COMPREHENSIVE METABOLIC PANEL
ALBUMIN: 4.5 g/dL (ref 3.4–5.0)
ALK PHOS: 109 U/L
Anion Gap: 7 (ref 7–16)
BUN: 19 mg/dL — ABNORMAL HIGH (ref 7–18)
Bilirubin,Total: 0.5 mg/dL (ref 0.2–1.0)
CREATININE: 0.82 mg/dL (ref 0.60–1.30)
Calcium, Total: 9.4 mg/dL (ref 8.5–10.1)
Chloride: 102 mmol/L (ref 98–107)
Co2: 30 mmol/L (ref 21–32)
EGFR (African American): >60
EGFR (Non-African Amer.): >60
Glucose: 96 mg/dL (ref 65–99)
OSMOLALITY: 280 (ref 275–301)
POTASSIUM: 2.9 mmol/L — AB (ref 3.5–5.1)
SGOT(AST): 16 U/L (ref 15–37)
SGPT (ALT): 25 U/L (ref 12–78)
Sodium: 139 mmol/L (ref 136–145)
TOTAL PROTEIN: 8.1 g/dL (ref 6.4–8.2)

## 2014-01-19 LAB — MAGNESIUM: Magnesium: 1.2 mg/dL — ABNORMAL LOW

## 2014-01-19 NOTE — ED Provider Notes (Signed)
Medical screening examination/treatment/procedure(s) were performed by non-physician practitioner and as supervising physician I was immediately available for consultation/collaboration.  EKG Interpretation   None         Junius ArgyleForrest S Hydeia Mcatee, MD 01/19/14 1220

## 2014-01-23 ENCOUNTER — Emergency Department: Payer: Self-pay | Admitting: Emergency Medicine

## 2014-01-23 LAB — POTASSIUM: Potassium: 3.2 mmol/L — ABNORMAL LOW (ref 3.5–5.1)

## 2014-01-23 LAB — MAGNESIUM: MAGNESIUM: 1.1 mg/dL — AB

## 2014-02-02 ENCOUNTER — Emergency Department: Payer: Self-pay | Admitting: Internal Medicine

## 2014-02-02 LAB — CBC
HCT: 40.8 % (ref 35.0–47.0)
HGB: 13.6 g/dL (ref 12.0–16.0)
MCH: 28.3 pg (ref 26.0–34.0)
MCHC: 33.4 g/dL (ref 32.0–36.0)
MCV: 85 fL (ref 80–100)
PLATELETS: 140 10*3/uL — AB (ref 150–440)
RBC: 4.81 10*6/uL (ref 3.80–5.20)
RDW: 15.1 % — ABNORMAL HIGH (ref 11.5–14.5)
WBC: 12.3 10*3/uL — AB (ref 3.6–11.0)

## 2014-02-02 LAB — BASIC METABOLIC PANEL
ANION GAP: 4 — AB (ref 7–16)
BUN: 13 mg/dL (ref 7–18)
CHLORIDE: 104 mmol/L (ref 98–107)
CO2: 30 mmol/L (ref 21–32)
Calcium, Total: 8.8 mg/dL (ref 8.5–10.1)
Creatinine: 0.78 mg/dL (ref 0.60–1.30)
Glucose: 80 mg/dL (ref 65–99)
OSMOLALITY: 275 (ref 275–301)
Potassium: 3.1 mmol/L — ABNORMAL LOW (ref 3.5–5.1)
Sodium: 138 mmol/L (ref 136–145)

## 2014-02-02 LAB — MAGNESIUM: Magnesium: 0.7 mg/dL — ABNORMAL LOW

## 2014-02-09 ENCOUNTER — Emergency Department: Payer: Self-pay | Admitting: Emergency Medicine

## 2014-02-09 LAB — MAGNESIUM: Magnesium: 0.8 mg/dL — ABNORMAL LOW

## 2014-02-11 ENCOUNTER — Emergency Department: Payer: Self-pay | Admitting: Urology

## 2014-02-11 LAB — CBC WITH DIFFERENTIAL/PLATELET
Basophil #: 0.1 10*3/uL (ref 0.0–0.1)
Basophil %: 0.5 %
EOS PCT: 0.3 %
Eosinophil #: 0 10*3/uL (ref 0.0–0.7)
HCT: 40 % (ref 35.0–47.0)
HGB: 13.6 g/dL (ref 12.0–16.0)
Lymphocyte #: 1.9 10*3/uL (ref 1.0–3.6)
Lymphocyte %: 15.3 %
MCH: 29.3 pg (ref 26.0–34.0)
MCHC: 34.1 g/dL (ref 32.0–36.0)
MCV: 86 fL (ref 80–100)
Monocyte #: 0.5 x10 3/mm (ref 0.2–0.9)
Monocyte %: 4.3 %
NEUTROS ABS: 9.7 10*3/uL — AB (ref 1.4–6.5)
NEUTROS PCT: 79.6 %
PLATELETS: 136 10*3/uL — AB (ref 150–440)
RBC: 4.66 10*6/uL (ref 3.80–5.20)
RDW: 15.7 % — ABNORMAL HIGH (ref 11.5–14.5)
WBC: 12.2 10*3/uL — AB (ref 3.6–11.0)

## 2014-02-11 LAB — URINALYSIS, COMPLETE
BLOOD: NEGATIVE
Bilirubin,UR: NEGATIVE
GLUCOSE, UR: NEGATIVE mg/dL (ref 0–75)
Leukocyte Esterase: NEGATIVE
NITRITE: NEGATIVE
PROTEIN: NEGATIVE
Ph: 5 (ref 4.5–8.0)
RBC,UR: 1 /HPF (ref 0–5)
SPECIFIC GRAVITY: 1.02 (ref 1.003–1.030)
Squamous Epithelial: 7

## 2014-02-11 LAB — BASIC METABOLIC PANEL
Anion Gap: 4 — ABNORMAL LOW (ref 7–16)
BUN: 11 mg/dL (ref 7–18)
CALCIUM: 8.9 mg/dL (ref 8.5–10.1)
CO2: 30 mmol/L (ref 21–32)
CREATININE: 0.7 mg/dL (ref 0.60–1.30)
Chloride: 103 mmol/L (ref 98–107)
EGFR (African American): >60
EGFR (Non-African Amer.): >60
GLUCOSE: 84 mg/dL (ref 65–99)
Osmolality: 272 (ref 275–301)
Potassium: 3.4 mmol/L — ABNORMAL LOW (ref 3.5–5.1)
Sodium: 137 mmol/L (ref 136–145)

## 2014-02-11 LAB — MAGNESIUM: Magnesium: 0.9 mg/dL — ABNORMAL LOW

## 2014-02-13 ENCOUNTER — Emergency Department (HOSPITAL_COMMUNITY)
Admission: EM | Admit: 2014-02-13 | Discharge: 2014-02-13 | Disposition: A | Discharge status: Home / Self Care | Payer: Medicaid Other | Attending: Emergency Medicine | Admitting: Emergency Medicine

## 2014-02-13 ENCOUNTER — Emergency Department (HOSPITAL_COMMUNITY): Payer: Medicaid Other

## 2014-02-13 DIAGNOSIS — Z950 Presence of cardiac pacemaker: Secondary | ICD-10-CM | POA: Insufficient documentation

## 2014-02-13 DIAGNOSIS — E876 Hypokalemia: Secondary | ICD-10-CM | POA: Insufficient documentation

## 2014-02-13 DIAGNOSIS — Z791 Long term (current) use of non-steroidal anti-inflammatories (NSAID): Secondary | ICD-10-CM | POA: Insufficient documentation

## 2014-02-13 DIAGNOSIS — N83201 Unspecified ovarian cyst, right side: Secondary | ICD-10-CM

## 2014-02-13 DIAGNOSIS — Z79899 Other long term (current) drug therapy: Secondary | ICD-10-CM | POA: Insufficient documentation

## 2014-02-13 DIAGNOSIS — N83209 Unspecified ovarian cyst, unspecified side: Secondary | ICD-10-CM | POA: Insufficient documentation

## 2014-02-13 DIAGNOSIS — G8929 Other chronic pain: Secondary | ICD-10-CM | POA: Insufficient documentation

## 2014-02-13 DIAGNOSIS — R109 Unspecified abdominal pain: Secondary | ICD-10-CM

## 2014-02-13 LAB — CBC WITH DIFFERENTIAL/PLATELET
BASOS ABS: 0 10*3/uL (ref 0.0–0.1)
Basophils Relative: 0 % (ref 0–1)
EOS PCT: 0 % (ref 0–5)
Eosinophils Absolute: 0 10*3/uL (ref 0.0–0.7)
HCT: 40.6 % (ref 36.0–46.0)
Hemoglobin: 14 g/dL (ref 12.0–15.0)
LYMPHS PCT: 20 % (ref 12–46)
Lymphs Abs: 2.5 10*3/uL (ref 0.7–4.0)
MCH: 29.1 pg (ref 26.0–34.0)
MCHC: 34.5 g/dL (ref 30.0–36.0)
MCV: 84.4 fL (ref 78.0–100.0)
Monocytes Absolute: 0.5 10*3/uL (ref 0.1–1.0)
Monocytes Relative: 4 % (ref 3–12)
NEUTROS ABS: 9.5 10*3/uL — AB (ref 1.7–7.7)
Neutrophils Relative %: 76 % (ref 43–77)
PLATELETS: 171 10*3/uL (ref 150–400)
RBC: 4.81 MIL/uL (ref 3.87–5.11)
RDW: 14.3 % (ref 11.5–15.5)
WBC: 12.6 10*3/uL — AB (ref 4.0–10.5)

## 2014-02-13 LAB — URINALYSIS, ROUTINE W REFLEX MICROSCOPIC
Bilirubin Urine: NEGATIVE
GLUCOSE, UA: NEGATIVE mg/dL
HGB URINE DIPSTICK: NEGATIVE
Ketones, ur: NEGATIVE mg/dL
Leukocytes, UA: NEGATIVE
Nitrite: NEGATIVE
PH: 6 (ref 5.0–8.0)
PROTEIN: NEGATIVE mg/dL
Specific Gravity, Urine: 1.02 (ref 1.005–1.030)
Urobilinogen, UA: 0.2 mg/dL (ref 0.0–1.0)

## 2014-02-13 LAB — COMPREHENSIVE METABOLIC PANEL
ALBUMIN: 4 g/dL (ref 3.5–5.2)
ALT: 35 U/L (ref 0–35)
AST: 32 U/L (ref 0–37)
Alkaline Phosphatase: 87 U/L (ref 39–117)
BUN: 11 mg/dL (ref 6–23)
CALCIUM: 9.7 mg/dL (ref 8.4–10.5)
CHLORIDE: 101 meq/L (ref 96–112)
CO2: 26 mEq/L (ref 19–32)
Creatinine, Ser: 0.72 mg/dL (ref 0.50–1.10)
GFR calc Af Amer: >90 mL/min (ref >90)
Glucose, Bld: 110 mg/dL — ABNORMAL HIGH (ref 70–99)
Potassium: 3.3 mEq/L — ABNORMAL LOW (ref 3.7–5.3)
SODIUM: 143 meq/L (ref 137–147)
Total Bilirubin: 0.2 mg/dL — ABNORMAL LOW (ref 0.3–1.2)
Total Protein: 7.4 g/dL (ref 6.0–8.3)

## 2014-02-13 LAB — LIPASE, BLOOD: Lipase: 24 U/L (ref 11–59)

## 2014-02-13 LAB — MAGNESIUM: MAGNESIUM: 1 mg/dL — AB (ref 1.5–2.5)

## 2014-02-13 MED ORDER — SODIUM CHLORIDE 0.9 % IV BOLUS (SEPSIS)
1000.0000 mL | Freq: Once | INTRAVENOUS | Status: AC
  Administered 2014-02-13: 1000 mL via INTRAVENOUS

## 2014-02-13 MED ORDER — MAGNESIUM SULFATE 50 % IJ SOLN: 1.0000 g | Freq: Once | INTRAMUSCULAR | Status: DC

## 2014-02-13 MED ORDER — MAGNESIUM SULFATE IN D5W 10-5 MG/ML-% IV SOLN
1.0000 g | Freq: Once | INTRAVENOUS | Status: AC
  Administered 2014-02-13: 1 g via INTRAVENOUS
  Filled 2014-02-13: qty 100

## 2014-02-13 MED ORDER — HEPARIN SOD (PORK) LOCK FLUSH 100 UNIT/ML IV SOLN
500.0000 [IU] | Freq: Once | INTRAVENOUS | Status: AC
  Administered 2014-02-13: 500 [IU]
  Filled 2014-02-13: qty 5

## 2014-02-13 MED ORDER — IOHEXOL 300 MG/ML  SOLN
80.0000 mL | Freq: Once | INTRAMUSCULAR | Status: AC | PRN
  Administered 2014-02-13: 80 mL via INTRAVENOUS

## 2014-02-13 MED ORDER — POTASSIUM CHLORIDE CRYS ER 20 MEQ PO TBCR
40.0000 meq | EXTENDED_RELEASE_TABLET | Freq: Once | ORAL | Status: AC
  Administered 2014-02-13: 40 meq via ORAL
  Filled 2014-02-13: qty 2

## 2014-02-13 MED ORDER — IOHEXOL 300 MG/ML  SOLN
25.0000 mL | Freq: Once | INTRAMUSCULAR | Status: AC | PRN
  Administered 2014-02-13: 25 mL via ORAL

## 2014-02-13 NOTE — ED Notes (Signed)
PA At bedside

## 2014-02-13 NOTE — ED Provider Notes (Signed)
CSN: [id number]     Arrival date & time 02/13/14  1548 History   First MD Initiated Contact with Patient 02/13/14 1845     Chief Complaint  Patient presents with  . Abdominal Pain     (Consider location/radiation/quality/duration/timing/severity/associated sxs/prior Treatment) The history is provided by the patient and a parent.   Pt with hx cerebral palsy, Gitelman syndrome, Potts syndrome presents with abdominal pain x 1.5 weeks.  Has had pain mostly on the left side of her abdomen that is now moving to the right.  Has had nausea.  Pain is worse with eating.  Having BM makes her nausea worse.  Pain is sharp, intermittent, lasts 30 minutes at a time and occurs "frequently," is 6/10 pain at worst. Denies fevers, vomiting, changes in bowel habits including diarrhea, constipation, bloody stool, urinary symptoms.  S/p partial hysterectomy.   Patient's mother gives majority of history though both patient and her mother talk simulatneously.  They have been seen recently at various hospitals and have been displeased with their care.  They have also been dismissed by several other their own doctors, and pt now has a new doctor in [place].     Past Medical History  Diagnosis Date  . Cerebral palsy   . Hypokalemia     now resolved  . 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  . Gitelman syndrome   . Chronic back pain   . Carpal tunnel syndrome    Past Surgical History  Procedure Laterality Date  . Pacemaker insertion    . Orthopedic surgery    . Abdominal hysterectomy    . Portacath placement     Family History  Problem Relation Age of Onset  . Diabetes Mother   . Hypertension Mother    History  Substance Use Topics  . Smoking status: Never Smoker   . Smokeless tobacco: Not on file  . Alcohol Use: No   OB History   Grav Para Term Preterm Abortions TAB SAB Ect Mult Living                 Review of Systems  Constitutional: Negative for fever.   Respiratory: Negative for cough and shortness of breath.   Cardiovascular: Negative for chest pain.  Gastrointestinal: Positive for abdominal pain. Negative for nausea, vomiting, diarrhea, constipation and blood in stool.  Genitourinary: Negative for dysuria, urgency, frequency, vaginal bleeding and vaginal discharge.      Allergies  Cymbalta; Gabapentin; Prozac; Sulfa antibiotics; and Ciprofloxacin  Home Medications   Current Outpatient Rx  Name  Route  Sig  Dispense  Refill  . celecoxib (CELEBREX) 200 MG capsule   Oral   Take 200 mg by mouth daily.         . magnesium oxide (MAG-OX) 400 MG tablet   Oral   Take 400 mg by mouth 2 (two) times daily.          . methylphenidate (RITALIN) 20 MG tablet   Oral   Take 20 mg by mouth. Up to three times per day.         . pregabalin (LYRICA) 50 MG capsule   Oral   Take 50 mg by mouth 3 (three) times daily.           BP 130/78  Pulse 94  Temp(Src) 98.4 F (36.9 C) (Oral)  SpO2 97%  LMP 06/09/2012 Physical Exam  Nursing note and vitals reviewed. Constitutional: She appears well-developed and well-nourished. No distress.  HENT:  Head: Normocephalic and atraumatic.  Neck: Neck supple.  Cardiovascular: Normal rate and regular rhythm.   Pulmonary/Chest: Effort normal and breath sounds normal. No respiratory distress. She has no wheezes. She has no rales.  Abdominal: Soft. She exhibits no distension. There is tenderness in the left upper quadrant and left lower quadrant. There is no rebound and no guarding.  Neurological: She is alert.  Skin: She is not diaphoretic.    ED Course  Procedures (including critical care time) Labs Review Labs Reviewed  CBC WITH DIFFERENTIAL - Abnormal; Notable for the following:    WBC 12.6 (*)    Neutro Abs 9.5 (*)    All other components within normal limits  COMPREHENSIVE METABOLIC PANEL - Abnormal; Notable for the following:    Potassium 3.3 (*)    Glucose, Bld 110 (*)    Total  Bilirubin 0.2 (*)    All other components within normal limits  MAGNESIUM - Abnormal; Notable for the following:    Magnesium 1.0 (*)    All other components within normal limits  LIPASE, BLOOD  URINALYSIS, ROUTINE W REFLEX MICROSCOPIC   Imaging Review Ct Abdomen Pelvis W Contrast  02/13/2014   CLINICAL DATA:  Abdominal pain, right lower  EXAM: CT ABDOMEN AND PELVIS WITH CONTRAST  TECHNIQUE: Multidetector CT imaging of the abdomen and pelvis was performed using the standard protocol following bolus administration of intravenous contrast.  CONTRAST:  80mL OMNIPAQUE IOHEXOL 300 MG/ML  SOLN  COMPARISON:  12/22/2008  FINDINGS: BODY WALL: Unremarkable.  LOWER CHEST: Direct pacer, with battery pack in the epigastrium.  ABDOMEN/PELVIS:  Liver: No focal abnormality.  Biliary: No evidence of biliary obstruction or stone.  Pancreas: The pancreatic body is not visible. No inflammation or ductal enlargement in the pancreatic head.  Spleen: Unremarkable.  Adrenals: Unremarkable.  Kidneys and ureters: There are numerous low-density areas within the medullary region of the bilateral kidneys, likely multiple small cysts. There is a macroscopic cyst within the interpolar right kidney measuring 14 mm. Punctate nonobstructive stone in the interpolar right kidney.  Bladder: Unremarkable.  Reproductive: The uterus is deviated to the left. On previous non contrast imaging there appear to be 2 discrete horns. 3.8 cm cyst in the right ovary.  Bowel: Large volume of formed stool throughout the colon. No bowel obstruction. Normal appendix.  Retroperitoneum: No mass or adenopathy.  Peritoneum: No free fluid or gas.  Vascular: No acute abnormality.  OSSEOUS: No acute abnormality. Levo curvature of the spine. Spondylotic changes to multiple thoracic and lumbar endplates.  IMPRESSION: 1. No acute intra-abdominal abnormality.  Normal appendix. 2. 4 cm right ovarian cyst. 3. Multi cystic kidneys. 4. Agenesis of the dorsal pancreas.    Electronically Signed   By: Tiburcio Pea [name]   On: 02/13/2014 22:09     EKG Interpretation None       MDM   Final diagnoses:  Abdominal pain  Right ovarian cyst  Hypomagnesemia  Hypokalemia    Pt with cerebral palsy, gitelman syndrome with multiple visits to various medical centers for her various complaints, frequently requiring magnesium due to her gitelman syndrome. Complaining today of 1.5 weeks of abdominal pain without associated symptoms.  Pain has been gradually increasing and WBC elevated, mostly tender over left side of abdomen but now increasing on right.  CT to r/o infection such as colitis.  CT significant for ovarian cyst.  D/C home with PCP follow up.  Pt declined pain medication during this visit.  Was eating  chips when I walked into the room to give results.  Discussed result, findings, treatment, and follow up  with patient.  Pt given return precautions.  Pt verbalizes understanding and agrees with plan.        Trixie Dredgemily Audra Bellard, PA-C 02/13/14 2348

## 2014-02-13 NOTE — ED Notes (Signed)
Pt encouraged to drink CT contrast

## 2014-02-13 NOTE — ED Notes (Signed)
Pt in CT.

## 2014-02-13 NOTE — ED Notes (Signed)
Pt and mother state that she has had abdominal pain x 1 week. Hx of low magnesium. Has been seen recently at [facility] [place]. No acute distress. Normal BM. No emesis. Nausea.

## 2014-02-13 NOTE — Discharge Instructions (Signed)
Read the information below.  You may return to the Emergency Department at any time for worsening condition or any new symptoms that concern you.  If you develop high fevers, worsening abdominal pain, uncontrolled vomiting, or are unable to tolerate fluids by mouth, return to the ER for a recheck.     Abdominal Pain, Adult Many things can cause abdominal pain. Usually, abdominal pain is not caused by a disease and will improve without treatment. It can often be observed and treated at home. Your health care provider will do a physical exam and possibly order blood tests and X-rays to help determine the seriousness of your pain. However, in many cases, more time must pass before a clear cause of the pain can be found. Before that point, your health care provider may not know if you need more testing or further treatment. HOME CARE INSTRUCTIONS  Monitor your abdominal pain for any changes. The following actions may help to alleviate any discomfort you are experiencing:  Only take over-the-counter or prescription medicines as directed by your health care provider.  Do not take laxatives unless directed to do so by your health care provider.  Try a clear liquid diet (broth, tea, or water) as directed by your health care provider. Slowly move to a bland diet as tolerated. SEEK MEDICAL CARE IF:  You have unexplained abdominal pain.  You have abdominal pain associated with nausea or diarrhea.  You have pain when you urinate or have a bowel movement.  You experience abdominal pain that wakes you in the night.  You have abdominal pain that is worsened or improved by eating food.  You have abdominal pain that is worsened with eating fatty foods. SEEK IMMEDIATE MEDICAL CARE IF:   Your pain does not go away within 2 hours.  You have a fever.  You keep throwing up (vomiting).  Your pain is felt only in portions of the abdomen, such as the right side or the left lower portion of the  abdomen.  You pass bloody or black tarry stools. MAKE SURE YOU:  Understand these instructions.   Will watch your condition.   Will get help right away if you are not doing well or get worse.  Document Released: 09/08/2005 Document Revised: 09/19/2013 Document Reviewed: 08/08/2013 [facility] Patient Information 2014 [employer] [place].

## 2014-02-15 NOTE — ED Provider Notes (Signed)
Medical screening examination/treatment/procedure(s) were performed by non-physician practitioner and as supervising physician I was immediately available for consultation/collaboration.   EKG Interpretation None        Damond Borchers T Lilly Gasser, MD 02/15/14 1410 

## 2014-02-18 ENCOUNTER — Emergency Department: Payer: Self-pay | Admitting: Emergency Medicine

## 2014-02-23 ENCOUNTER — Emergency Department: Payer: Self-pay | Admitting: Emergency Medicine

## 2014-03-02 ENCOUNTER — Encounter (HOSPITAL_COMMUNITY): Payer: Self-pay | Admitting: Emergency Medicine

## 2014-03-02 ENCOUNTER — Emergency Department (HOSPITAL_COMMUNITY)
Admission: EM | Admit: 2014-03-02 | Discharge: 2014-03-02 | Disposition: A | Discharge status: Home / Self Care | Payer: Medicaid Other | Attending: Emergency Medicine | Admitting: Emergency Medicine

## 2014-03-02 DIAGNOSIS — M545 Low back pain, unspecified: Secondary | ICD-10-CM | POA: Insufficient documentation

## 2014-03-02 DIAGNOSIS — Z79899 Other long term (current) drug therapy: Secondary | ICD-10-CM | POA: Insufficient documentation

## 2014-03-02 DIAGNOSIS — Z3202 Encounter for pregnancy test, result negative: Secondary | ICD-10-CM | POA: Insufficient documentation

## 2014-03-02 DIAGNOSIS — Z791 Long term (current) use of non-steroidal anti-inflammatories (NSAID): Secondary | ICD-10-CM | POA: Insufficient documentation

## 2014-03-02 DIAGNOSIS — Z950 Presence of cardiac pacemaker: Secondary | ICD-10-CM | POA: Insufficient documentation

## 2014-03-02 DIAGNOSIS — M549 Dorsalgia, unspecified: Secondary | ICD-10-CM | POA: Diagnosis present

## 2014-03-02 DIAGNOSIS — G8929 Other chronic pain: Secondary | ICD-10-CM | POA: Insufficient documentation

## 2014-03-02 LAB — URINALYSIS, ROUTINE W REFLEX MICROSCOPIC
Bilirubin Urine: NEGATIVE
GLUCOSE, UA: NEGATIVE mg/dL
HGB URINE DIPSTICK: NEGATIVE
Ketones, ur: NEGATIVE mg/dL
Leukocytes, UA: NEGATIVE
Nitrite: NEGATIVE
PH: 5 (ref 5.0–8.0)
Protein, ur: NEGATIVE mg/dL
SPECIFIC GRAVITY, URINE: 1.019 (ref 1.005–1.030)
Urobilinogen, UA: 0.2 mg/dL (ref 0.0–1.0)

## 2014-03-02 LAB — BASIC METABOLIC PANEL
BUN: 13 mg/dL (ref 6–23)
CHLORIDE: 101 meq/L (ref 96–112)
CO2: 28 meq/L (ref 19–32)
Calcium: 9.2 mg/dL (ref 8.4–10.5)
Creatinine, Ser: 0.72 mg/dL (ref 0.50–1.10)
GFR calc Af Amer: >90 mL/min (ref >90)
GFR calc non Af Amer: >90 mL/min (ref >90)
GLUCOSE: 93 mg/dL (ref 70–99)
Potassium: 3.7 mEq/L (ref 3.7–5.3)
SODIUM: 142 meq/L (ref 137–147)

## 2014-03-02 LAB — CBC
HEMATOCRIT: 36.7 % (ref 36.0–46.0)
HEMOGLOBIN: 12.7 g/dL (ref 12.0–15.0)
MCH: 29 pg (ref 26.0–34.0)
MCHC: 34.6 g/dL (ref 30.0–36.0)
MCV: 83.8 fL (ref 78.0–100.0)
Platelets: 173 10*3/uL (ref 150–400)
RBC: 4.38 MIL/uL (ref 3.87–5.11)
RDW: 14 % (ref 11.5–15.5)
WBC: 7.8 10*3/uL (ref 4.0–10.5)

## 2014-03-02 LAB — POC URINE PREG, ED: PREG TEST UR: NEGATIVE

## 2014-03-02 LAB — MAGNESIUM: Magnesium: 1 mg/dL — ABNORMAL LOW (ref 1.5–2.5)

## 2014-03-02 MED ORDER — MAGNESIUM SULFATE 40 MG/ML IJ SOLN
2.0000 g | Freq: Once | INTRAMUSCULAR | Status: AC
  Administered 2014-03-02: 2 g via INTRAVENOUS
  Filled 2014-03-02: qty 50

## 2014-03-02 MED ORDER — KETOROLAC TROMETHAMINE 30 MG/ML IJ SOLN
30.0000 mg | Freq: Once | INTRAMUSCULAR | Status: AC
  Administered 2014-03-02: 30 mg via INTRAVENOUS
  Filled 2014-03-02: qty 1

## 2014-03-02 MED ORDER — SODIUM CHLORIDE 0.9 % IV BOLUS (SEPSIS)
500.0000 mL | Freq: Once | INTRAVENOUS | Status: AC
  Administered 2014-03-02: 500 mL via INTRAVENOUS

## 2014-03-02 NOTE — ED Notes (Signed)
MD at bedside. 

## 2014-03-02 NOTE — ED Notes (Signed)
Pt c/o t4 and t5 back pain. Pt dizzy and per pt's mother believes her magnesium needs to be checked. Pt's mother states that [facility] refused to see pt.

## 2014-03-02 NOTE — ED Notes (Signed)
Pt also c/o left hip pain ?

## 2014-03-02 NOTE — ED Provider Notes (Signed)
CSN: [id number]     Arrival date & time 03/02/14  1620 History   First MD Initiated Contact with Patient 03/02/14 1644     Chief Complaint  Patient presents with  . Dizziness  . Back Pain  . possible low magnesium      (Consider location/radiation/quality/duration/timing/severity/associated sxs/prior Treatment) Patient is a 33 y.o. female presenting with dizziness and back pain. The history is provided by the patient.  Dizziness Quality:  Lightheadedness Severity:  Mild Onset quality:  Gradual Timing:  Constant Progression:  Unchanged Chronicity:  Recurrent Context comment:  At rest Relieved by:  Nothing Worsened by:  Nothing tried Ineffective treatments:  None tried Associated symptoms: no chest pain, no diarrhea, no headaches, no nausea, no shortness of breath and no vomiting   Back Pain Associated symptoms: no abdominal pain, no chest pain, no dysuria, no fever and no headaches     Past Medical History  Diagnosis Date  . Cerebral palsy   . Hypokalemia     now resolved  . 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  . Gitelman syndrome   . Chronic back pain   . Carpal tunnel syndrome    Past Surgical History  Procedure Laterality Date  . Pacemaker insertion    . Orthopedic surgery    . Abdominal hysterectomy    . Portacath placement     Family History  Problem Relation Age of Onset  . Diabetes Mother   . Hypertension Mother    History  Substance Use Topics  . Smoking status: Never Smoker   . Smokeless tobacco: Not on file  . Alcohol Use: No   OB History   Grav Para Term Preterm Abortions TAB SAB Ect Mult Living                 Review of Systems  Constitutional: Negative for fever and fatigue.  HENT: Negative for congestion and drooling.   Eyes: Negative for pain.  Respiratory: Negative for cough and shortness of breath.   Cardiovascular: Negative for chest pain.  Gastrointestinal: Negative for nausea, vomiting,  abdominal pain and diarrhea.  Genitourinary: Negative for dysuria and hematuria.  Musculoskeletal: Positive for back pain. Negative for gait problem and neck pain.  Skin: Negative for color change.  Neurological: Positive for dizziness. Negative for headaches.  Hematological: Negative for adenopathy.  Psychiatric/Behavioral: Negative for behavioral problems.  All other systems reviewed and are negative.      Allergies  Cymbalta; Gabapentin; Prozac; and Sulfa antibiotics  Home Medications   Current Outpatient Rx  Name  Route  Sig  Dispense  Refill  . albuterol (PROVENTIL) (2.5 MG/3ML) 0.083% nebulizer solution   Nebulization   Take 2.5 mg by nebulization every 6 (six) hours as needed for wheezing or shortness of breath.         . celecoxib (CELEBREX) 200 MG capsule   Oral   Take 200 mg by mouth daily.         . magnesium oxide (MAG-OX) 400 MG tablet   Oral   Take 400 mg by mouth 2 (two) times daily.          . methylphenidate (RITALIN) 20 MG tablet   Oral   Take 20 mg by mouth. Up to three times per day.         . pregabalin (LYRICA) 50 MG capsule   Oral   Take 50 mg by mouth 3 (three) times daily.  BP 136/88  Pulse 85  Temp(Src) 98.4 F (36.9 C) (Oral)  Resp 85  SpO2 100%  LMP 06/09/2012 Physical Exam  Nursing note and vitals reviewed. Constitutional: She is oriented to person, place, and time. She appears well-developed and well-nourished.  HENT:  Head: Normocephalic.  Mouth/Throat: Oropharynx is clear and moist. No oropharyngeal exudate.  Eyes: Conjunctivae and EOM are normal. Pupils are equal, round, and reactive to light.  Neck: Normal range of motion. Neck supple.  Cardiovascular: Normal rate, regular rhythm, normal heart sounds and intact distal pulses.  Exam reveals no gallop and no friction rub.   No murmur heard. Pulmonary/Chest: Effort normal and breath sounds normal. No respiratory distress. She has no wheezes.  Abdominal: Soft.  Bowel sounds are normal. There is no tenderness. There is no rebound and no guarding.  Musculoskeletal: Normal range of motion. She exhibits no edema.  No vertebral tenderness noted. The patient has mild bilateral paraspinal lumbar tenderness to palpation. No CVA tenderness noted.  Neurological: She is alert and oriented to person, place, and time.  Skin: Skin is warm and dry.  Psychiatric: She has a normal mood and affect. Her behavior is normal.    ED Course  Procedures (including critical care time) Labs Review Labs Reviewed  MAGNESIUM - Abnormal; Notable for the following:    Magnesium 1.0 (*)    All other components within normal limits  URINALYSIS, ROUTINE W REFLEX MICROSCOPIC - Abnormal; Notable for the following:    APPearance CLOUDY (*)    All other components within normal limits  CBC  BASIC METABOLIC PANEL  POC URINE PREG, ED   Imaging Review No results found.   EKG Interpretation None      MDM   Final diagnoses:  Back pain  Hypomagnesemia    5:05 PM 33 y.o. female w hx hx of CP, POTTS, Gitelman syn w/ Mg wasting who is been seen here multiple times in the past for various complaints who presents with mild dizziness and generalized weakness. She states that she has these symptoms and her magnesium is low and frequently needs magnesium supplementation in the ER. She began having these symptoms today. She also complains of slight worsening of chronic back pain over the last 2 days. The mother denies any fevers, vomiting, or diarrhea at home. Patient denies any GU symptoms. She is afebrile and vital signs are unremarkable here. Will get screening labs and urine. Toradol for pain.  Mg low at 1.0. Potassium and Ca wnl. Will give 2g IV.   8:40 PM: I interpreted/reviewed the labs and/or imaging which were non-contributory.  Pt feeling better on exam.  I have discussed the diagnosis/risks/treatment options with the patient and family and believe the pt to be eligible for  discharge home to follow-up with pcp as needed. We also discussed returning to the ED immediately if new or worsening sx occur. We discussed the sx which are most concerning (e.g., worsening back pain, fever) that necessitate immediate return. Medications administered to the patient during their visit and any new prescriptions provided to the patient are listed below.  Medications given during this visit Medications  sodium chloride 0.9 % bolus 500 mL (0 mLs Intravenous Stopped 03/02/14 1915)  ketorolac (TORADOL) 30 MG/ML injection 30 mg (30 mg Intravenous Given 03/02/14 1809)  magnesium sulfate IVPB 2 g 50 mL (0 g Intravenous Stopped 03/02/14 2028)    New Prescriptions   No medications on file     Junius ArgyleForrest S Undra Harriman, MD 03/02/14 2309

## 2014-03-07 ENCOUNTER — Emergency Department: Payer: Self-pay | Admitting: Internal Medicine

## 2014-03-07 LAB — TROPONIN I: Troponin-I: <0.02 ng/mL

## 2014-03-07 LAB — BASIC METABOLIC PANEL
ANION GAP: 4 — AB (ref 7–16)
BUN: 13 mg/dL (ref 7–18)
Calcium, Total: 8.9 mg/dL (ref 8.5–10.1)
Chloride: 105 mmol/L (ref 98–107)
Co2: 30 mmol/L (ref 21–32)
Creatinine: 0.85 mg/dL (ref 0.60–1.30)
EGFR (African American): >60
EGFR (Non-African Amer.): >60
GLUCOSE: 91 mg/dL (ref 65–99)
Osmolality: 277 (ref 275–301)
Potassium: 3.3 mmol/L — ABNORMAL LOW (ref 3.5–5.1)
SODIUM: 139 mmol/L (ref 136–145)

## 2014-03-07 LAB — CBC
HCT: 40.6 % (ref 35.0–47.0)
HGB: 13.8 g/dL (ref 12.0–16.0)
MCH: 29.5 pg (ref 26.0–34.0)
MCHC: 34 g/dL (ref 32.0–36.0)
MCV: 87 fL (ref 80–100)
Platelet: 141 10*3/uL — ABNORMAL LOW (ref 150–440)
RBC: 4.69 10*6/uL (ref 3.80–5.20)
RDW: 15 % — ABNORMAL HIGH (ref 11.5–14.5)
WBC: 9.1 10*3/uL (ref 3.6–11.0)

## 2014-03-07 LAB — MAGNESIUM: Magnesium: 0.9 mg/dL — ABNORMAL LOW

## 2014-03-09 ENCOUNTER — Encounter (HOSPITAL_COMMUNITY): Payer: Self-pay | Admitting: Emergency Medicine

## 2014-03-09 ENCOUNTER — Emergency Department (HOSPITAL_COMMUNITY)
Admission: EM | Admit: 2014-03-09 | Discharge: 2014-03-09 | Disposition: A | Discharge status: Home / Self Care | Payer: Medicaid Other | Attending: Emergency Medicine | Admitting: Emergency Medicine

## 2014-03-09 DIAGNOSIS — M545 Low back pain, unspecified: Secondary | ICD-10-CM | POA: Insufficient documentation

## 2014-03-09 DIAGNOSIS — G8929 Other chronic pain: Secondary | ICD-10-CM | POA: Insufficient documentation

## 2014-03-09 DIAGNOSIS — Z862 Personal history of diseases of the blood and blood-forming organs and certain disorders involving the immune mechanism: Secondary | ICD-10-CM | POA: Insufficient documentation

## 2014-03-09 DIAGNOSIS — Z8639 Personal history of other endocrine, nutritional and metabolic disease: Secondary | ICD-10-CM | POA: Insufficient documentation

## 2014-03-09 DIAGNOSIS — M549 Dorsalgia, unspecified: Secondary | ICD-10-CM

## 2014-03-09 DIAGNOSIS — Z79899 Other long term (current) drug therapy: Secondary | ICD-10-CM | POA: Insufficient documentation

## 2014-03-09 DIAGNOSIS — Z8679 Personal history of other diseases of the circulatory system: Secondary | ICD-10-CM | POA: Insufficient documentation

## 2014-03-09 MED ORDER — LIDOCAINE 5 % EX PTCH
1.0000 | MEDICATED_PATCH | CUTANEOUS | Status: DC
  Administered 2014-03-09: 1 via TRANSDERMAL
  Filled 2014-03-09: qty 1

## 2014-03-09 NOTE — ED Notes (Signed)
Pt presents with c/o back pain that started about one week ago. Pt says she was seen for the same last Saturday and given toradol and a referral. Pt did follow up with orthopedic doctor. Pt called the orthopedic office today and was told to come to the ER. Pt has hx of chronic back pain.

## 2014-03-09 NOTE — ED Provider Notes (Signed)
CSN: [id number]     Arrival date & time 03/09/14  1623 History   First MD Initiated Contact with Patient 03/09/14 1654     Chief Complaint  Patient presents with  . Back Pain    HPI Patient presents emergency room with complaints of lower back pain. The patient has history of chronic back problems. She sees Dr. Ethelene Halamos.  In the past she has had spinal injections and has used Lidoderm pain patches.  In the last week or so the patient's had an exacerbation of her pain. She tried to get in with her orthopedic doctor before the weekend but there was some miscommunication and she is unable to do so. Patient was having more pain this weekend so when she called her doctor they suggested she come to the emergency room for treatment. patient states the pain is low but better now.  At times it has been severe radiating towards her left leg. She's also had a pins and needle sensation. She has not had any recent falls or injuries. No numbness or weakness.   Past Medical History  Diagnosis Date  . Cerebral palsy   . Hypokalemia     now resolved  . 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  . Gitelman syndrome   . Chronic back pain   . Carpal tunnel syndrome    Past Surgical History  Procedure Laterality Date  . Pacemaker insertion    . Orthopedic surgery    . Abdominal hysterectomy    . Portacath placement     Family History  Problem Relation Age of Onset  . Diabetes Mother   . Hypertension Mother    History  Substance Use Topics  . Smoking status: Never Smoker   . Smokeless tobacco: Not on file  . Alcohol Use: No   OB History   Grav Para Term Preterm Abortions TAB SAB Ect Mult Living                 Review of Systems  Constitutional: Negative for fever.  Gastrointestinal: Negative for abdominal pain.  Genitourinary: Negative for difficulty urinating.       No incontinence  Neurological: Negative for weakness.       No saddle anesthesia  All other  systems reviewed and are negative.      Allergies  Cymbalta; Gabapentin; Prozac; and Sulfa antibiotics  Home Medications   Current Outpatient Rx  Name  Route  Sig  Dispense  Refill  . albuterol (PROVENTIL) (2.5 MG/3ML) 0.083% nebulizer solution   Nebulization   Take 2.5 mg by nebulization every 6 (six) hours as needed for wheezing or shortness of breath.         . magnesium oxide (MAG-OX) 400 MG tablet   Oral   Take 400 mg by mouth 2 (two) times daily.          . methylphenidate (RITALIN) 20 MG tablet   Oral   Take 20 mg by mouth 3 (three) times daily as needed (Patient Preference).          . pregabalin (LYRICA) 50 MG capsule   Oral   Take 50 mg by mouth 3 (three) times daily.           BP 127/83  Pulse 85  Temp(Src) 97.6 F (36.4 C) (Oral)  Resp 16  SpO2 100%  LMP 06/09/2012 Physical Exam  Nursing note and vitals reviewed. Constitutional: She appears well-developed and well-nourished. No distress.  HENT:  Head: Normocephalic and atraumatic.  Right Ear: External ear normal.  Left Ear: External ear normal.  Eyes: Conjunctivae are normal. Right eye exhibits no discharge. Left eye exhibits no discharge. No scleral icterus.  Neck: Neck supple. No tracheal deviation present.  Cardiovascular: Normal rate.   Pulmonary/Chest: Effort normal. No stridor. No respiratory distress.  Musculoskeletal: She exhibits no edema.       Lumbar back: She exhibits no bony tenderness, no swelling and no edema.  Neurological: She is alert. Cranial nerve deficit: no gross deficits. She exhibits abnormal muscle tone. Coordination abnormal.  Spasticity,  Skin: Skin is warm and dry. No rash noted.  Psychiatric: She has a normal mood and affect.    ED Course  Procedures (including critical care time) Labs Review Labs Reviewed - No data to display Imaging Review No results found.   EKG Interpretation None      MDM   Final diagnoses:  Back pain    Patient has history  of chronic back problems. She never has had an MRI because of a pacemaker. Patient is not acutely having any neurologic symptoms. I offered pain medications but she would only like any Lidoderm patch.  Patient had been taking a medication which he found effective but most recently her insurance would not pay for a refill.  Patient is given a Lidoderm patch. She has plans to follow up with orthopedic doctor next week    Celene Kras, MD 03/09/14 [phone]

## 2014-03-09 NOTE — Discharge Instructions (Signed)
Back Pain, Adult Low back pain is very common. About 1 in 5 people have back pain.The cause of low back pain is rarely dangerous. The pain often gets better over time.About half of people with a sudden onset of back pain feel better in just 2 weeks. About 8 in 10 people feel better by 6 weeks.  CAUSES Some common causes of back pain include:  Strain of the muscles or ligaments supporting the spine.  Wear and tear (degeneration) of the spinal discs.  Arthritis.  Direct injury to the back. DIAGNOSIS Most of the time, the direct cause of low back pain is not known.However, back pain can be treated effectively even when the exact cause of the pain is unknown.Answering your caregiver's questions about your overall health and symptoms is one of the most accurate ways to make sure the cause of your pain is not dangerous. If your caregiver needs more information, he or she may order lab work or imaging tests (X-rays or MRIs).However, even if imaging tests show changes in your back, this usually does not require surgery. HOME CARE INSTRUCTIONS For many people, back pain returns.Since low back pain is rarely dangerous, it is often a condition that people can learn to manageon their own.   Remain active. It is stressful on the back to sit or stand in one place. Do not sit, drive, or stand in one place for more than 30 minutes at a time. Take short walks on level surfaces as soon as pain allows.Try to increase the length of time you walk each day.  Do not stay in bed.Resting more than 1 or 2 days can delay your recovery.  Do not avoid exercise or work.Your body is made to move.It is not dangerous to be active, even though your back may hurt.Your back will likely heal faster if you return to being active before your pain is gone.  Pay attention to your body when you bend and lift. Many people have less discomfortwhen lifting if they bend their knees, keep the load close to their bodies,and  avoid twisting. Often, the most comfortable positions are those that put less stress on your recovering back.  Find a comfortable position to sleep. Use a firm mattress and lie on your side with your knees slightly bent. If you lie on your back, put a pillow under your knees.  Only take over-the-counter or prescription medicines as directed by your caregiver. Over-the-counter medicines to reduce pain and inflammation are often the most helpful.Your caregiver may prescribe muscle relaxant drugs.These medicines help dull your pain so you can more quickly return to your normal activities and healthy exercise.  Put ice on the injured area.  Put ice in a plastic bag.  Place a towel between your skin and the bag.  Leave the ice on for 15-20 minutes, 03-04 times a day for the first 2 to 3 days. After that, ice and heat may be alternated to reduce pain and spasms.  Ask your caregiver about trying back exercises and gentle massage. This may be of some benefit.  Avoid feeling anxious or stressed.Stress increases muscle tension and can worsen back pain.It is important to recognize when you are anxious or stressed and learn ways to manage it.Exercise is a great option. SEEK MEDICAL CARE IF:  You have pain that is not relieved with rest or medicine.  You have pain that does not improve in 1 week.  You have new symptoms.  You are generally not feeling well. SEEK   IMMEDIATE MEDICAL CARE IF:   You have pain that radiates from your back into your legs.  You develop new bowel or bladder control problems.  You have unusual weakness or numbness in your arms or legs.  You develop nausea or vomiting.  You develop abdominal pain.  You feel faint. Document Released: 11/29/2005 Document Revised: 05/30/2012 Document Reviewed: 04/19/2011 [employer] Patient Information 2014 [employer].  

## 2014-03-14 ENCOUNTER — Encounter (HOSPITAL_COMMUNITY): Payer: Self-pay | Admitting: Emergency Medicine

## 2014-03-14 ENCOUNTER — Emergency Department (HOSPITAL_COMMUNITY)
Admission: EM | Admit: 2014-03-14 | Discharge: 2014-03-14 | Disposition: A | Discharge status: Home / Self Care | Payer: Medicaid Other | Attending: Emergency Medicine | Admitting: Emergency Medicine

## 2014-03-14 DIAGNOSIS — Z79899 Other long term (current) drug therapy: Secondary | ICD-10-CM | POA: Insufficient documentation

## 2014-03-14 DIAGNOSIS — M2550 Pain in unspecified joint: Secondary | ICD-10-CM

## 2014-03-14 DIAGNOSIS — I498 Other specified cardiac arrhythmias: Secondary | ICD-10-CM | POA: Insufficient documentation

## 2014-03-14 DIAGNOSIS — Z950 Presence of cardiac pacemaker: Secondary | ICD-10-CM | POA: Insufficient documentation

## 2014-03-14 DIAGNOSIS — M2559 Pain in other specified joint: Secondary | ICD-10-CM | POA: Insufficient documentation

## 2014-03-14 DIAGNOSIS — M549 Dorsalgia, unspecified: Secondary | ICD-10-CM | POA: Insufficient documentation

## 2014-03-14 DIAGNOSIS — G8929 Other chronic pain: Secondary | ICD-10-CM | POA: Insufficient documentation

## 2014-03-14 MED ORDER — LIDOCAINE 5 % EX PTCH
1.0000 | MEDICATED_PATCH | CUTANEOUS | Status: DC
  Filled 2014-03-14: qty 1

## 2014-03-14 NOTE — ED Provider Notes (Signed)
CSN: [id number]     Arrival date & time 03/14/14  1703 History  This chart was scribed for non-physician practitioner Roxy Horseman, PA-C, working with Hurman Horn, MD, by Yevette Edwards, ED Scribe. This patient was seen in room WTR6/WTR6 and the patient's care was started at 5:38 PM.   First MD Initiated Contact with Patient 03/14/14 1731     Chief Complaint  Patient presents with  . Medication Request     The history is provided by the patient and a parent. No language interpreter was used.    HPI Comments: [name] is a 33 y.o. female, with a h/o cerebral palsy and chronic back pain, who presents to the Emergency Department requesting a medication refill of a lidoderm patch for chronic back pain. She also reports pain to her hip. The rates the pain as 7/10.  She was treated last week at [facility] for similar symptoms and prescribed a lidoderm patch. The pt's mother has not been able to obtain an appointment with [facility] and that she is having issues with Medicaid. Her mother states Dr. Ethelene Hal has given her injections to her pt, but the pt was unable to have an injection last week. She does not have a scheduled appointment with Dr. Ethelene Hal.   Past Medical History  Diagnosis Date  . Cerebral palsy   . Hypokalemia     now resolved  . 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  . Gitelman syndrome   . Chronic back pain   . Carpal tunnel syndrome    Past Surgical History  Procedure Laterality Date  . Pacemaker insertion    . Orthopedic surgery    . Abdominal hysterectomy    . Portacath placement     Family History  Problem Relation Age of Onset  . Diabetes Mother   . Hypertension Mother    History  Substance Use Topics  . Smoking status: Never Smoker   . Smokeless tobacco: Not on file  . Alcohol Use: No   No OB history provided.  Review of Systems  Constitutional: Negative for fever and chills.  Gastrointestinal:   No bowel incontinence  Genitourinary:       No urinary incontinence  Musculoskeletal: Positive for arthralgias, back pain and myalgias.  Neurological:       No saddle anesthesia    Allergies  Cymbalta; Gabapentin; Prozac; and Sulfa antibiotics  Home Medications   Current Outpatient Rx  Name  Route  Sig  Dispense  Refill  . albuterol (PROVENTIL) (2.5 MG/3ML) 0.083% nebulizer solution   Nebulization   Take 2.5 mg by nebulization every 6 (six) hours as needed for wheezing or shortness of breath.         . magnesium oxide (MAG-OX) 400 MG tablet   Oral   Take 400 mg by mouth 2 (two) times daily.          . methylphenidate (RITALIN) 20 MG tablet   Oral   Take 20 mg by mouth 3 (three) times daily as needed (Patient Preference).          . pregabalin (LYRICA) 50 MG capsule   Oral   Take 50 mg by mouth 3 (three) times daily.           Triage Vitals: BP 105/71  Pulse 86  Temp(Src) 98.7 F (37.1 C) (Oral)  Resp 16  SpO2 100%  LMP 06/09/2012  Physical Exam  Nursing note and vitals reviewed. Constitutional:  She is oriented to person, place, and time. She appears well-developed and well-nourished. No distress.  HENT:  Head: Normocephalic and atraumatic.  Eyes: EOM are normal.  Neck: Neck supple. No tracheal deviation present.  Cardiovascular: Normal rate.   Pulmonary/Chest: Effort normal. No respiratory distress.  Musculoskeletal: Normal range of motion.  Neurological: She is alert and oriented to person, place, and time.  Skin: Skin is warm and dry.  Psychiatric: She has a normal mood and affect. Her behavior is normal.    ED Course  Procedures (including critical care time)  DIAGNOSTIC STUDIES: Oxygen Saturation is 100% on room air, normal by my interpretation.    COORDINATION OF CARE:  5:52 PM- Discussed treatment plan with patient and her mother, and they did not agree to the plan. When offered a lidoderm patch refill, the pt refused the refill. When  offered to assess the magnesium levels, the mother declined.     Labs Review Labs Reviewed - No data to display Imaging Review No results found.   EKG Interpretation None      MDM   Final diagnoses:  Chronic pain  Back pain   Patient with chronic back pain.  Will treat with lidoderm patch per request and give resources for chronic pain management. After giving resources regarding pain management, the patient and her mother became very agitated and began commenting on how many doctors have discharged her from their practice, and how many doctors they are going to sue for mistreatment.  Upon telling the patient and her mother that she needs to follow-up with pain management, because the ED is not the appropriate place to manage chronic pain, the patient and her mother became very angry and said that "if we do not treat patients, what are we even here for?"  I reiterated that I am treating her, but said that in the "future," it would be better to follow-up with chronic pain management.  At this point they said that they would not even like to be treated today because they are so frustrated. I let the patient calm down for several minutes and then re-offered my original treatment, to which she still refused.  Will discharge the patient to home.  I personally performed the services described in this documentation, which was scribed in my presence. The recorded information has been reviewed and is accurate.     Roxy Horsemanobert Ryla Cauthon, PA-C 03/14/14 1811

## 2014-03-14 NOTE — ED Notes (Signed)
Pt BIB mother. Pt states she has back pain and is requesting a lidoderm patch. Pt unable to get lidoderm patch prescription filled. Pt with no acute distress.

## 2014-03-14 NOTE — Discharge Instructions (Signed)
Chronic Pain Discharge Instructions  °Emergency care providers appreciate that many patients coming to us are in severe pain and we wish to address their pain in the safest, most responsible manner.  It is important to recognize however, that the proper treatment of chronic pain differs from that of the pain of injuries and acute illnesses.  Our goal is to provide quality, safe, personalized care and we thank you for giving us the opportunity to serve you. °The use of narcotics and related agents for chronic pain syndromes may lead to additional physical and psychological problems.  Nearly as many people die from prescription narcotics each year as die from car crashes.  Additionally, this risk is increased if such prescriptions are obtained from a variety of sources.  Therefore, only your primary care physician or a pain management specialist is able to safely treat such syndromes with narcotic medications long-term.   ° °Documentation revealing such prescriptions have been sought from multiple sources may prohibit us from providing a refill or different narcotic medication.  Your name may be checked first through the [place] Controlled Substances Reporting System.  This database is a record of controlled substance medication prescriptions that the patient has received.  This has been established by [facility] in an effort to eliminate the dangerous, and often life threatening, practice of obtaining multiple prescriptions from different medical providers.  ° °If you have a chronic pain syndrome (i.e. chronic headaches, recurrent back or neck pain, dental pain, abdominal or pelvis pain without a specific diagnosis, or neuropathic pain such as fibromyalgia) or recurrent visits for the same condition without an acute diagnosis, you may be treated with non-narcotics and other non-addictive medicines.  Allergic reactions or negative side effects that may be reported by a patient to such medications will not  typically lead to the use of a narcotic analgesic or other controlled substance as an alternative. °  °Patients managing chronic pain with a personal physician should have provisions in place for breakthrough pain.  If you are in crisis, you should call your physician.  If your physician directs you to the emergency department, please have the doctor call and speak to our attending physician concerning your care. °  °When patients come to the Emergency Department (ED) with acute medical conditions in which the Emergency Department physician feels appropriate to prescribe narcotic or sedating pain medication, the physician will prescribe these in very limited quantities.  The amount of these medications will last only until you can see your primary care physician in his/her office.  Any patient who returns to the ED seeking refills should expect only non-narcotic pain medications.  ° °In the event of an acute medical condition exists and the emergency physician feels it is necessary that the patient be given a narcotic or sedating medication -  a responsible adult driver should be present in the room prior to the medication being given by the nurse. °  °Prescriptions for narcotic or sedating medications that have been lost, stolen or expired will not be refilled in the Emergency Department.   ° °Patients who have chronic pain may receive non-narcotic prescriptions until seen by their primary care physician.  It is every patient’s personal responsibility to maintain active prescriptions with his or her primary care physician or specialist. °

## 2014-03-14 NOTE — ED Notes (Signed)
Pt and pt's mother upset about evaluation by provider PA Dahlia ClientBrowning and refused Lidocaine patch.

## 2014-03-15 NOTE — ED Provider Notes (Signed)
Medical screening examination/treatment/procedure(s) were performed by non-physician practitioner and as supervising physician I was immediately available for consultation/collaboration.   Asenath Balash M Carolan Avedisian, MD 03/15/14 1423 

## 2014-03-18 ENCOUNTER — Emergency Department: Payer: Self-pay | Admitting: Emergency Medicine

## 2014-03-18 LAB — BASIC METABOLIC PANEL
Anion Gap: 6 — ABNORMAL LOW (ref 7–16)
BUN: 12 mg/dL (ref 7–18)
CALCIUM: 8.8 mg/dL (ref 8.5–10.1)
CHLORIDE: 106 mmol/L (ref 98–107)
CO2: 30 mmol/L (ref 21–32)
Creatinine: 0.84 mg/dL (ref 0.60–1.30)
EGFR (African American): >60
EGFR (Non-African Amer.): >60
Glucose: 90 mg/dL (ref 65–99)
Osmolality: 282 (ref 275–301)
Potassium: 3.3 mmol/L — ABNORMAL LOW (ref 3.5–5.1)
Sodium: 142 mmol/L (ref 136–145)

## 2014-03-18 LAB — MAGNESIUM: Magnesium: 1 mg/dL — ABNORMAL LOW

## 2014-04-05 ENCOUNTER — Emergency Department: Payer: Self-pay | Admitting: Emergency Medicine

## 2014-04-05 LAB — BASIC METABOLIC PANEL
ANION GAP: 6 — AB (ref 7–16)
BUN: 11 mg/dL (ref 7–18)
CALCIUM: 8.2 mg/dL — AB (ref 8.5–10.1)
CHLORIDE: 104 mmol/L (ref 98–107)
Co2: 29 mmol/L (ref 21–32)
Creatinine: 0.6 mg/dL (ref 0.60–1.30)
Glucose: 85 mg/dL (ref 65–99)
Osmolality: 276 (ref 275–301)
Potassium: 3.7 mmol/L (ref 3.5–5.1)
SODIUM: 139 mmol/L (ref 136–145)

## 2014-04-05 LAB — CBC
HCT: 39.3 % (ref 35.0–47.0)
HGB: 13.4 g/dL (ref 12.0–16.0)
MCH: 29.6 pg (ref 26.0–34.0)
MCHC: 34.1 g/dL (ref 32.0–36.0)
MCV: 87 fL (ref 80–100)
Platelet: 159 10*3/uL (ref 150–440)
RBC: 4.53 10*6/uL (ref 3.80–5.20)
RDW: 13.4 % (ref 11.5–14.5)
WBC: 10.4 10*3/uL (ref 3.6–11.0)

## 2014-04-05 LAB — MAGNESIUM: Magnesium: 0.8 mg/dL — ABNORMAL LOW

## 2014-04-18 ENCOUNTER — Inpatient Hospital Stay (HOSPITAL_COMMUNITY): Payer: Medicaid Other

## 2014-04-18 ENCOUNTER — Inpatient Hospital Stay (HOSPITAL_COMMUNITY)
Admission: AD | Admit: 2014-04-18 | Discharge: 2014-04-18 | Disposition: A | Discharge status: Home / Self Care | Payer: Medicaid Other | Source: Ambulatory Visit | Attending: Obstetrics and Gynecology | Admitting: Obstetrics and Gynecology

## 2014-04-18 ENCOUNTER — Encounter (HOSPITAL_COMMUNITY): Payer: Self-pay | Admitting: *Deleted

## 2014-04-18 DIAGNOSIS — Z950 Presence of cardiac pacemaker: Secondary | ICD-10-CM | POA: Insufficient documentation

## 2014-04-18 DIAGNOSIS — Z90710 Acquired absence of both cervix and uterus: Secondary | ICD-10-CM | POA: Insufficient documentation

## 2014-04-18 DIAGNOSIS — G809 Cerebral palsy, unspecified: Secondary | ICD-10-CM | POA: Insufficient documentation

## 2014-04-18 DIAGNOSIS — N949 Unspecified condition associated with female genital organs and menstrual cycle: Secondary | ICD-10-CM | POA: Insufficient documentation

## 2014-04-18 DIAGNOSIS — N83209 Unspecified ovarian cyst, unspecified side: Secondary | ICD-10-CM

## 2014-04-18 DIAGNOSIS — R109 Unspecified abdominal pain: Secondary | ICD-10-CM | POA: Insufficient documentation

## 2014-04-18 HISTORY — DX: Presence of cardiac pacemaker: Z95.0

## 2014-04-18 LAB — URINALYSIS, ROUTINE W REFLEX MICROSCOPIC
Bilirubin Urine: NEGATIVE
Glucose, UA: NEGATIVE mg/dL
KETONES UR: NEGATIVE mg/dL
Leukocytes, UA: NEGATIVE
NITRITE: NEGATIVE
PH: 6.5 (ref 5.0–8.0)
Protein, ur: NEGATIVE mg/dL
SPECIFIC GRAVITY, URINE: 1.02 (ref 1.005–1.030)
UROBILINOGEN UA: 0.2 mg/dL (ref 0.0–1.0)

## 2014-04-18 LAB — URINE MICROSCOPIC-ADD ON

## 2014-04-18 MED ORDER — OXYCODONE-ACETAMINOPHEN 5-325 MG PO TABS: 1.0000 | ORAL_TABLET | Freq: Four times a day (QID) | ORAL | Status: DC | PRN

## 2014-04-18 NOTE — MAU Note (Signed)
Pt DX at [facility] for a rt ovarian mass.  Pt states the pain is really bothering her.  Pt has had a partial Hysterectomy about 2 years ago.  Denies vaginal bleeding or discharge.

## 2014-04-18 NOTE — Discharge Instructions (Signed)

## 2014-04-18 NOTE — MAU Provider Note (Signed)
Chief Complaint: Abdominal Pain   First Provider Initiated Contact with Patient 04/18/14 1612     SUBJECTIVE HPI: [name] is a 33 y.o. G0 who presents to maternity admissions reporting right lower quadrant pain and reporting she had CT scan recently indicating ovarian cyst/mass.  She has [facility] but was concerned about the pain so came in tonight.  She describes the pain as feeling "like something is about to pop."  Pt has CP and has history of chronic back pain, managed by Dr Ethelene Halamos.  She has an appointment for injection tomorrow so cannot take pain medication during her visit today.  She is not sexually active and denies risks for STDs.  She denies vaginal bleeding, vaginal itching/burning, urinary symptoms, h/a, dizziness, n/v, or fever/chills.     Past Medical History  Diagnosis Date  . Cerebral palsy   . Hypokalemia     now resolved  . 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  . Gitelman syndrome   . Chronic back pain   . Carpal tunnel syndrome   . Pacemaker    Past Surgical History  Procedure Laterality Date  . Pacemaker insertion    . Orthopedic surgery    . Abdominal hysterectomy    . Portacath placement     History   Social History  . Marital Status: Single    Spouse Name: N/A    Number of Children: N/A  . Years of Education: N/A   Occupational History  . Not on file.   Social History Main Topics  . Smoking status: Never Smoker   . Smokeless tobacco: Not on file  . Alcohol Use: No  . Drug Use: No  . Sexual Activity: No   Other Topics Concern  . Not on file   Social History Narrative  . No narrative on file   No current facility-administered medications on file prior to encounter.   Current Outpatient Prescriptions on File Prior to Encounter  Medication Sig Dispense Refill  . albuterol (PROVENTIL) (2.5 MG/3ML) 0.083% nebulizer solution Take 2.5 mg by nebulization every 6 (six) hours as  needed for wheezing or shortness of breath.      . magnesium oxide (MAG-OX) 400 MG tablet Take 400 mg by mouth 2 (two) times daily.       . methylphenidate (RITALIN) 20 MG tablet Take 20 mg by mouth 3 (three) times daily as needed (Patient Preference).       . pregabalin (LYRICA) 50 MG capsule Take 50 mg by mouth 3 (three) times daily.        Allergies  Allergen Reactions  . Cymbalta [Duloxetine Hcl]     Stressed out  . Gabapentin     Goes crazy  . Prozac [Fluoxetine Hcl] Nausea And Vomiting  . Sulfa Antibiotics Nausea And Vomiting    ROS: Pertinent items in HPI  OBJECTIVE Blood pressure 135/99, pulse 89, temperature 97.8 F (36.6 C), temperature source Oral, resp. rate 18, height 5\' 2"  (1.575 m), weight 49.896 kg (110 lb), last menstrual period 06/09/2012. GENERAL: Well-developed, well-nourished female in no acute distress.  HEENT: Normocephalic HEART: normal rate RESP: normal effort ABDOMEN: Soft, non-tender, no rebound tenderness or guarding EXTREMITIES: Nontender, no edema NEURO: Alert and oriented Pelvic exam: Cervix pink, visually closed, without lesion, scant white creamy discharge, vaginal walls and external genitalia normal Bimanual exam: Cervix 0/long/high, firm, anterior, neg CMT, uterus nontender, nonenlarged, adnexa with tenderness on right, no  enlargement or mass bilaterally   LAB RESULTS Results for orders placed during the hospital encounter of 04/18/14 (from the past 24 hour(s))  URINALYSIS, ROUTINE W REFLEX MICROSCOPIC     Status: Abnormal   Collection Time    04/18/14  4:40 PM      Result Value Ref Range   Color, Urine YELLOW  YELLOW   APPearance CLEAR  CLEAR   Specific Gravity, Urine 1.020  1.005 - 1.030   pH 6.5  5.0 - 8.0   Glucose, UA NEGATIVE  NEGATIVE mg/dL   Hgb urine dipstick LARGE (*) NEGATIVE   Bilirubin Urine NEGATIVE  NEGATIVE   Ketones, ur NEGATIVE  NEGATIVE mg/dL   Protein, ur NEGATIVE  NEGATIVE mg/dL   Urobilinogen, UA 0.2  0.0 - 1.0  mg/dL   Nitrite NEGATIVE  NEGATIVE   Leukocytes, UA NEGATIVE  NEGATIVE  URINE MICROSCOPIC-ADD ON     Status: Abnormal   Collection Time    04/18/14  4:40 PM      Result Value Ref Range   Squamous Epithelial / LPF MANY (*) RARE   WBC, UA 0-2  <3 WBC/hpf   RBC / HPF 3-6  <3 RBC/hpf   Bacteria, UA FEW (*) RARE   Urine-Other FEW YEAST      IMAGING US Transvaginal Non-ob  04/18/2014   CLINICAL DATA:  Right-sided pelvic pain.  EXAM: TRANSABDOMINAL AND TRANSVAGINAL ULTRASOUND OF PELVIS  DOPPLER ULTRASOUND OF OVARIES  TECHNIQUE: Both transabdominal and transvaginal ultrasound examinations of the pelvis were performed. Transabdominal technique was performed for global imaging of the pelvis including uterus, ovaries, adnexal regions, and pelvic cul-de-sac.  It was necessary to proceed with endovaginal exam following the transabdominal exam to visualize the ovaries and endometrium. Color and duplex Doppler ultrasound was utilized to evaluate blood flow to the ovaries.  COMPARISON:  CT scan 02/13/2014  FINDINGS: Uterus  Measurements: Surgically absent.  Endometrium  Thickness: N/A.  Right ovary  4.8 x 3.4 x 3.7 cm. Two adjacent adnexal cysts associated with the right ovary. These measure 3.4 x 2.4 x 2.3 cm and 3.2 x 2.1 x 2.1 cm. No worrisome imaging features. Patent ovarian blood flow noted.  Left ovary  Measurements: Surgically absent.  Pulsed Doppler evaluation of the right ovary demonstrates normal low-resistance arterial and venous waveforms.  Other findings  No free fluid.  IMPRESSION: Two adjacent simple appearing right ovarian cysts.  Patent right ovarian blood flow.  Status post hysterectomy and left oophorectomy.   Electronically Signed   By: Loralie Champagne [name]   On: 04/18/2014 18:01   US Pelvis Complete  04/18/2014   CLINICAL DATA:  Right-sided pelvic pain.  EXAM: TRANSABDOMINAL AND TRANSVAGINAL ULTRASOUND OF PELVIS  DOPPLER ULTRASOUND OF OVARIES  TECHNIQUE: Both transabdominal and transvaginal  ultrasound examinations of the pelvis were performed. Transabdominal technique was performed for global imaging of the pelvis including uterus, ovaries, adnexal regions, and pelvic cul-de-sac.  It was necessary to proceed with endovaginal exam following the transabdominal exam to visualize the ovaries and endometrium. Color and duplex Doppler ultrasound was utilized to evaluate blood flow to the ovaries.  COMPARISON:  CT scan 02/13/2014  FINDINGS: Uterus  Measurements: Surgically absent.  Endometrium  Thickness: N/A.  Right ovary  4.8 x 3.4 x 3.7 cm. Two adjacent adnexal cysts associated with the right ovary. These measure 3.4 x 2.4 x 2.3 cm and 3.2 x 2.1 x 2.1 cm. No worrisome imaging features. Patent ovarian blood flow noted.  Left ovary  Measurements:  Surgically absent.  Pulsed Doppler evaluation of the right ovary demonstrates normal low-resistance arterial and venous waveforms.  Other findings  No free fluid.  IMPRESSION: Two adjacent simple appearing right ovarian cysts.  Patent right ovarian blood flow.  Status post hysterectomy and left oophorectomy.   Electronically Signed   By: Loralie ChampagneMark  Gallerani [name]   On: 04/18/2014 18:01   [place] Art/ven Flow Abd Pelv Doppler  04/18/2014   CLINICAL DATA:  Right-sided pelvic pain.  EXAM: TRANSABDOMINAL AND TRANSVAGINAL ULTRASOUND OF PELVIS  DOPPLER ULTRASOUND OF OVARIES  TECHNIQUE: Both transabdominal and transvaginal ultrasound examinations of the pelvis were performed. Transabdominal technique was performed for global imaging of the pelvis including uterus, ovaries, adnexal regions, and pelvic cul-de-sac.  It was necessary to proceed with endovaginal exam following the transabdominal exam to visualize the ovaries and endometrium. Color and duplex Doppler ultrasound was utilized to evaluate blood flow to the ovaries.  COMPARISON:  CT scan 02/13/2014  FINDINGS: Uterus  Measurements: Surgically absent.  Endometrium  Thickness: N/A.  Right ovary  4.8 x 3.4 x 3.7 cm. Two  adjacent adnexal cysts associated with the right ovary. These measure 3.4 x 2.4 x 2.3 cm and 3.2 x 2.1 x 2.1 cm. No worrisome imaging features. Patent ovarian blood flow noted.  Left ovary  Measurements: Surgically absent.  Pulsed Doppler evaluation of the right ovary demonstrates normal low-resistance arterial and venous waveforms.  Other findings  No free fluid.  IMPRESSION: Two adjacent simple appearing right ovarian cysts.  Patent right ovarian blood flow.  Status post hysterectomy and left oophorectomy.   Electronically Signed   By: Loralie ChampagneMark  Gallerani [name]   On: 04/18/2014 18:01    ASSESSMENT 1. Simple ovarian cyst     PLAN Discharge home Percocet 5/325 Rx, 1-2 tabs Q 6 hours PRN x10 tabs to start after pain injection Ibuprofen 600 mg PO Q 6 hours  [facility] Return to MAU as needed for emergencies    Medication List         albuterol (2.5 MG/3ML) 0.083% nebulizer solution  Commonly known as:  PROVENTIL  Take 2.5 mg by nebulization every 6 (six) hours as needed for wheezing or shortness of breath.     magnesium oxide 400 MG tablet  Commonly known as:  MAG-OX  Take 400 mg by mouth 2 (two) times daily.     oxyCODONE-acetaminophen 5-325 MG per tablet  Commonly known as:  PERCOCET/ROXICET  Take 1-2 tablets by mouth every 6 (six) hours as needed.     pregabalin 50 MG capsule  Commonly known as:  LYRICA  Take 50 mg by mouth 3 (three) times daily.     RITALIN 20 MG tablet  Generic drug:  methylphenidate  Take 20 mg by mouth 3 (three) times daily as needed (Patient Preference).           Follow-up Information   [facility]. (For emergencies.  Keep scheduled Gyn appointment at [facility] next week.)    Contact information:   [address] [id number] [address] [id number] [phone]      Sharen CounterLisa Leftwich-Kirby Certified Nurse-Midwife 04/18/2014  6:58 PM

## 2014-04-19 NOTE — MAU Provider Note (Signed)
Attestation of Attending Supervision of Advanced Practitioner (CNM/NP): Evaluation and management procedures were performed by the Advanced Practitioner under my supervision and collaboration.  I have reviewed the Advanced Practitioner's note and chart, and I agree with the management and plan.  Cuca Benassi 04/19/2014 6:08 AM

## 2014-06-08 ENCOUNTER — Emergency Department: Payer: Self-pay | Admitting: Emergency Medicine

## 2014-06-08 LAB — BASIC METABOLIC PANEL
Anion Gap: 5 — ABNORMAL LOW (ref 7–16)
BUN: 13 mg/dL (ref 7–18)
CO2: 29 mmol/L (ref 21–32)
Calcium, Total: 9.2 mg/dL (ref 8.5–10.1)
Chloride: 107 mmol/L (ref 98–107)
Creatinine: 0.84 mg/dL (ref 0.60–1.30)
GLUCOSE: 94 mg/dL (ref 65–99)
Osmolality: 281 (ref 275–301)
Potassium: 3.3 mmol/L — ABNORMAL LOW (ref 3.5–5.1)
Sodium: 141 mmol/L (ref 136–145)

## 2014-06-08 LAB — MAGNESIUM: MAGNESIUM: 0.8 mg/dL — AB

## 2014-06-14 ENCOUNTER — Emergency Department: Payer: Self-pay | Admitting: Emergency Medicine

## 2014-06-14 LAB — CBC WITH DIFFERENTIAL/PLATELET
BASOS PCT: 0.7 %
Basophil #: 0.1 10*3/uL (ref 0.0–0.1)
EOS ABS: 0.1 10*3/uL (ref 0.0–0.7)
Eosinophil %: 0.6 %
HCT: 37.5 % (ref 35.0–47.0)
HGB: 13.1 g/dL (ref 12.0–16.0)
Lymphocyte #: 2.2 10*3/uL (ref 1.0–3.6)
Lymphocyte %: 22.3 %
MCH: 30.1 pg (ref 26.0–34.0)
MCHC: 34.9 g/dL (ref 32.0–36.0)
MCV: 86 fL (ref 80–100)
Monocyte #: 0.7 x10 3/mm (ref 0.2–0.9)
Monocyte %: 6.8 %
NEUTROS ABS: 7 10*3/uL — AB (ref 1.4–6.5)
Neutrophil %: 69.6 %
Platelet: 157 10*3/uL (ref 150–440)
RBC: 4.35 10*6/uL (ref 3.80–5.20)
RDW: 13.1 % (ref 11.5–14.5)
WBC: 10 10*3/uL (ref 3.6–11.0)

## 2014-06-14 LAB — COMPREHENSIVE METABOLIC PANEL
ALBUMIN: 4 g/dL (ref 3.4–5.0)
ALK PHOS: 63 U/L
ALT: 25 U/L (ref 12–78)
Anion Gap: 10 (ref 7–16)
BUN: 7 mg/dL (ref 7–18)
Bilirubin,Total: 0.5 mg/dL (ref 0.2–1.0)
CHLORIDE: 103 mmol/L (ref 98–107)
Calcium, Total: 8.8 mg/dL (ref 8.5–10.1)
Co2: 27 mmol/L (ref 21–32)
Creatinine: 0.69 mg/dL (ref 0.60–1.30)
EGFR (African American): >60
Glucose: 77 mg/dL (ref 65–99)
OSMOLALITY: 276 (ref 275–301)
Potassium: 2.9 mmol/L — ABNORMAL LOW (ref 3.5–5.1)
SGOT(AST): 21 U/L (ref 15–37)
SODIUM: 140 mmol/L (ref 136–145)
Total Protein: 7.2 g/dL (ref 6.4–8.2)

## 2014-06-14 LAB — MAGNESIUM: Magnesium: 0.6 mg/dL — ABNORMAL LOW

## 2014-06-16 ENCOUNTER — Emergency Department: Payer: Self-pay | Admitting: Emergency Medicine

## 2014-06-23 ENCOUNTER — Emergency Department: Payer: Self-pay | Admitting: Emergency Medicine

## 2014-06-23 LAB — MAGNESIUM: MAGNESIUM: 0.9 mg/dL — AB

## 2014-06-26 ENCOUNTER — Emergency Department: Payer: Self-pay | Admitting: Internal Medicine

## 2014-07-07 ENCOUNTER — Encounter (HOSPITAL_COMMUNITY): Payer: Self-pay | Admitting: Emergency Medicine

## 2014-07-07 ENCOUNTER — Emergency Department (HOSPITAL_COMMUNITY)
Admission: EM | Admit: 2014-07-07 | Discharge: 2014-07-07 | Disposition: A | Discharge status: Home / Self Care | Payer: Medicaid Other | Attending: Emergency Medicine | Admitting: Emergency Medicine

## 2014-07-07 ENCOUNTER — Emergency Department (HOSPITAL_COMMUNITY): Payer: Medicaid Other

## 2014-07-07 DIAGNOSIS — Z8679 Personal history of other diseases of the circulatory system: Secondary | ICD-10-CM | POA: Insufficient documentation

## 2014-07-07 DIAGNOSIS — Z79899 Other long term (current) drug therapy: Secondary | ICD-10-CM | POA: Insufficient documentation

## 2014-07-07 DIAGNOSIS — Z950 Presence of cardiac pacemaker: Secondary | ICD-10-CM | POA: Diagnosis not present

## 2014-07-07 DIAGNOSIS — M7918 Myalgia, other site: Secondary | ICD-10-CM

## 2014-07-07 DIAGNOSIS — Z8669 Personal history of other diseases of the nervous system and sense organs: Secondary | ICD-10-CM | POA: Diagnosis not present

## 2014-07-07 DIAGNOSIS — G8929 Other chronic pain: Secondary | ICD-10-CM | POA: Diagnosis not present

## 2014-07-07 DIAGNOSIS — M533 Sacrococcygeal disorders, not elsewhere classified: Secondary | ICD-10-CM | POA: Diagnosis present

## 2014-07-07 DIAGNOSIS — Z862 Personal history of diseases of the blood and blood-forming organs and certain disorders involving the immune mechanism: Secondary | ICD-10-CM | POA: Insufficient documentation

## 2014-07-07 DIAGNOSIS — Z8639 Personal history of other endocrine, nutritional and metabolic disease: Secondary | ICD-10-CM | POA: Insufficient documentation

## 2014-07-07 MED ORDER — METHOCARBAMOL 500 MG PO TABS: 500.0000 mg | ORAL_TABLET | Freq: Three times a day (TID) | ORAL | Status: DC | PRN

## 2014-07-07 NOTE — Discharge Instructions (Signed)
[facility] tomorrow as scheduled.  Please follow with your primary care doctor and make them aware of the current situation.  For breakthrough pain you may take Robaxin. Do not drink alcohol, drive or operate heavy machinery when taking Robaxin.  Muscle Pain Muscle pain (myalgia) may be caused by many things, including:  Overuse or muscle strain, especially if you are not in shape. This is the most common cause of muscle pain.  Injury.  Bruises.  Viruses, such as the flu.  Infectious diseases.  Fibromyalgia, which is a chronic condition that causes muscle tenderness, fatigue, and headache.  Autoimmune diseases, including lupus.  Certain drugs, including ACE inhibitors and statins. Muscle pain may be mild or severe. In most cases, the pain lasts only a short time and goes away without treatment. To diagnose the cause of your muscle pain, your health care provider will take your medical history. This means he or she will ask you when your muscle pain began and what has been happening. If you have not had muscle pain for very long, your health care provider may want to wait before doing much testing. If your muscle pain has lasted a long time, your health care provider may want to run tests right away. If your health care provider thinks your muscle pain may be caused by illness, you may need to have additional tests to rule out certain conditions.  Treatment for muscle pain depends on the cause. Home care is often enough to relieve muscle pain. Your health care provider may also prescribe anti-inflammatory medicine. HOME CARE INSTRUCTIONS Watch your condition for any changes. The following actions may help to lessen any discomfort you are feeling:  Only take over-the-counter or prescription medicines as directed by your health care provider.  Apply ice to the sore muscle:  Put ice in a plastic bag.  Place a towel between your skin and the  bag.  Leave the ice on for 15-20 minutes, 3-4 times a day.  You may alternate applying hot and cold packs to the muscle as directed by your health care provider.  If overuse is causing your muscle pain, slow down your activities until the pain goes away.  Remember that it is normal to feel some muscle pain after starting a workout program. Muscles that have not been used often will be sore at first.  Do regular, gentle exercises if you are not usually active.  Warm up before exercising to lower your risk of muscle pain.  Do not continue working out if the pain is very bad. Bad pain could mean you have injured a muscle. SEEK MEDICAL CARE IF:  Your muscle pain gets worse, and medicines do not help.  You have muscle pain that lasts longer than 3 days.  You have a rash or fever along with muscle pain.  You have muscle pain after a tick bite.  You have muscle pain while working out, even though you are in good physical condition.  You have redness, soreness, or swelling along with muscle pain.  You have muscle pain after starting a new medicine or changing the dose of a medicine. SEEK IMMEDIATE MEDICAL CARE IF:  You have trouble breathing.  You have trouble swallowing.  You have muscle pain along with a stiff neck, fever, and vomiting.  You have severe muscle weakness or cannot move part of your body. MAKE SURE YOU:   Understand these instructions.  Will watch your condition.  Will get help  right away if you are not doing well or get worse. Document Released: 10/21/2006 Document Revised: 12/04/2013 Document Reviewed: 09/25/2013 [facility] Patient Information 2015 [place], [place]. This information is not intended to replace advice given to you by your health care provider. Make sure you discuss any questions you have with your health care provider.

## 2014-07-07 NOTE — ED Provider Notes (Signed)
CSN: [id number]     Arrival date & time 07/07/14  1652 History   First MD Initiated Contact with Patient 07/07/14 1729     Chief Complaint  Patient presents with  . Muscle Pain   Patient is a 33 y.o. female presenting with musculoskeletal pain.  Muscle Pain   This chart was scribed for non-physician practitioner Wynetta EmeryNicole Jettie Mannor, PA-C working with Gerhard Munchobert Lockwood, MD, by Andrew Auaven Small, ED Scribe. This patient was seen in room WTR9/WTR9 and the patient's care was started at 5:41 PM.  [name] is a 33 y.o. female with h/o of cerebral palsy, pace maker and POTS syndrome who presents to the Emergency Department complaining of left buttocks. Pt denies known injury. Pt often crawls and falls on her buttock. Pt only has pain to area with sitting. She describes the pain as uncomfortable. Pt has not taken pain medication. Pt had left knee surgery at the end of June. She has a schedule appointment with orthopedist tomorrow.    Past Medical History  Diagnosis Date  . Cerebral palsy   . Hypokalemia     now resolved  . 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  . Gitelman syndrome   . Chronic back pain   . Carpal tunnel syndrome   . Pacemaker    Past Surgical History  Procedure Laterality Date  . Pacemaker insertion    . Orthopedic surgery    . Abdominal hysterectomy    . Portacath placement     Family History  Problem Relation Age of Onset  . Diabetes Mother   . Hypertension Mother    History  Substance Use Topics  . Smoking status: Never Smoker   . Smokeless tobacco: Not on file  . Alcohol Use: No   OB History   Grav Para Term Preterm Abortions TAB SAB Ect Mult Living                 Review of Systems  Constitutional: Negative for fever and chills.  Musculoskeletal: Positive for myalgias. Negative for back pain and neck pain.  All other systems reviewed and are negative.     Allergies  Cymbalta; Gabapentin; Prozac; and Sulfa  antibiotics  Home Medications   Prior to Admission medications   Medication Sig Start Date End Date Taking? Authorizing Provider  albuterol (PROVENTIL) (2.5 MG/3ML) 0.083% nebulizer solution Take 2.5 mg by nebulization every 6 (six) hours as needed for wheezing or shortness of breath.   Yes Historical Provider, MD  magnesium oxide (MAG-OX) 400 MG tablet Take 400 mg by mouth 2 (two) times daily.    Yes Historical Provider, MD  methylphenidate (RITALIN) 20 MG tablet Take 20 mg by mouth 3 (three) times daily as needed (Patient Preference).    Yes Historical Provider, MD  pregabalin (LYRICA) 50 MG capsule Take 50 mg by mouth 3 (three) times daily.    Yes Historical Provider, MD   BP 115/66  Pulse 85  Temp(Src) 98.2 F (36.8 C) (Oral)  Resp 16  SpO2 100%  LMP 06/09/2012 Physical Exam  Nursing note and vitals reviewed. Constitutional: She is oriented to person, place, and time. She appears well-developed and well-nourished. No distress.  HENT:  Head: Normocephalic.  Mouth/Throat: Oropharynx is clear and moist.  Eyes: Conjunctivae and EOM are normal. Pupils are equal, round, and reactive to light.  Neck: Normal range of motion.  Cardiovascular: Normal rate.   Pulmonary/Chest: Effort normal and breath sounds normal. No stridor.  Abdominal:  Soft. Bowel sounds are normal. She exhibits no distension and no mass. There is no tenderness. There is no rebound and no guarding.  Musculoskeletal: Normal range of motion. She exhibits tenderness.  No tenderness to palpation along the coccyx or left hip. No ecchymosis along the left gluteus however patient is tender to palpation.  Neurological: She is alert and oriented to person, place, and time.  Skin:  L. healing surgical incision to right knee with no redness, warmth or discharge.  Psychiatric: She has a normal mood and affect.    ED Course  Procedures (including critical care time) Labs Review Labs Reviewed - No data to display  Imaging  Review No results found.   EKG Interpretation None      MDM   Final diagnoses:  None   Filed Vitals:   07/07/14 1712  BP: 115/66  Pulse: 85  Temp: 98.2 F (36.8 C)  TempSrc: Oral  Resp: 16  SpO2: 100%    Medications - No data to display  [name] is a 33 y.o. female presenting with left gluteal pain. Patient has cerebral palsy and angulation is very difficult. Mother states that she frequently falls. No specific falls before this pain exacerbation occurred.  X-ray of the sacrum coccyx and left hip is negative. I will start her on Robaxin. Patient has followup with her orthopedist tomorrow. I have encouraged her to discuss this with orthopedist at postop checkup.   Evaluation does not show pathology that would require ongoing emergent intervention or inpatient treatment. Pt is hemodynamically stable and mentating appropriately. Discussed findings and plan with patient/guardian, who agrees with care plan. All questions answered. Return precautions discussed and outpatient follow up given.   Discharge Medication List as of 07/07/2014  6:43 PM    START taking these medications   Details  methocarbamol (ROBAXIN) 500 MG tablet Take 1 tablet (500 mg total) by mouth every 8 (eight) hours as needed for muscle spasms., Starting 07/07/2014, Until Discontinued, Print          I personally performed the services described in this documentation, which was scribed in my presence. The recorded information has been reviewed and is accurate.      Wynetta Emery, PA-C 07/07/14 2018

## 2014-07-07 NOTE — ED Notes (Signed)
Patient transported to X-ray 

## 2014-07-07 NOTE — ED Notes (Signed)
Pt reports L sided buttock and hip pain x2 weeks.

## 2014-07-08 NOTE — ED Provider Notes (Signed)
  Medical screening examination/treatment/procedure(s) were performed by non-physician practitioner and as supervising physician I was immediately available for consultation/collaboration.   EKG Interpretation None         Gerhard Munchobert Paton Crum, MD 07/08/14 [id number]

## 2014-07-30 ENCOUNTER — Ambulatory Visit: Payer: Self-pay | Admitting: Nurse Practitioner

## 2014-08-25 ENCOUNTER — Emergency Department: Payer: Self-pay | Admitting: Emergency Medicine

## 2014-08-28 ENCOUNTER — Emergency Department: Payer: Self-pay | Admitting: Student

## 2014-09-01 ENCOUNTER — Ambulatory Visit: Payer: Self-pay | Admitting: Emergency Medicine

## 2014-09-01 LAB — CBC WITH DIFFERENTIAL/PLATELET
Basophil #: 0 10*3/uL (ref 0.0–0.1)
Basophil %: 0.4 %
Eosinophil #: 0 10*3/uL (ref 0.0–0.7)
Eosinophil %: 0.2 %
HCT: 38.4 % (ref 35.0–47.0)
HGB: 13 g/dL (ref 12.0–16.0)
Lymphocyte #: 2.4 10*3/uL (ref 1.0–3.6)
Lymphocyte %: 22.3 %
MCH: 29.4 pg (ref 26.0–34.0)
MCHC: 33.7 g/dL (ref 32.0–36.0)
MCV: 87 fL (ref 80–100)
Monocyte #: 0.6 x10 3/mm (ref 0.2–0.9)
Monocyte %: 5.4 %
Neutrophil #: 7.7 10*3/uL — ABNORMAL HIGH (ref 1.4–6.5)
Neutrophil %: 71.7 %
Platelet: 173 10*3/uL (ref 150–440)
RBC: 4.41 10*6/uL (ref 3.80–5.20)
RDW: 13.3 % (ref 11.5–14.5)
WBC: 10.7 10*3/uL (ref 3.6–11.0)

## 2014-09-01 LAB — MAGNESIUM: Magnesium: 0.8 mg/dL — ABNORMAL LOW

## 2014-09-09 IMAGING — CR DG CHEST 2V
1 series · 2 of 2 positions shown · non-contrast
Comparison: DG CHEST 2V dated 12/22/2013

CLINICAL DATA: Three day history of chest pain.

EXAM:
CHEST  2 VIEW

[Series 1: w chest pa · 0.14mm/px · 2 of 2 slices shown]
[im 1/2]
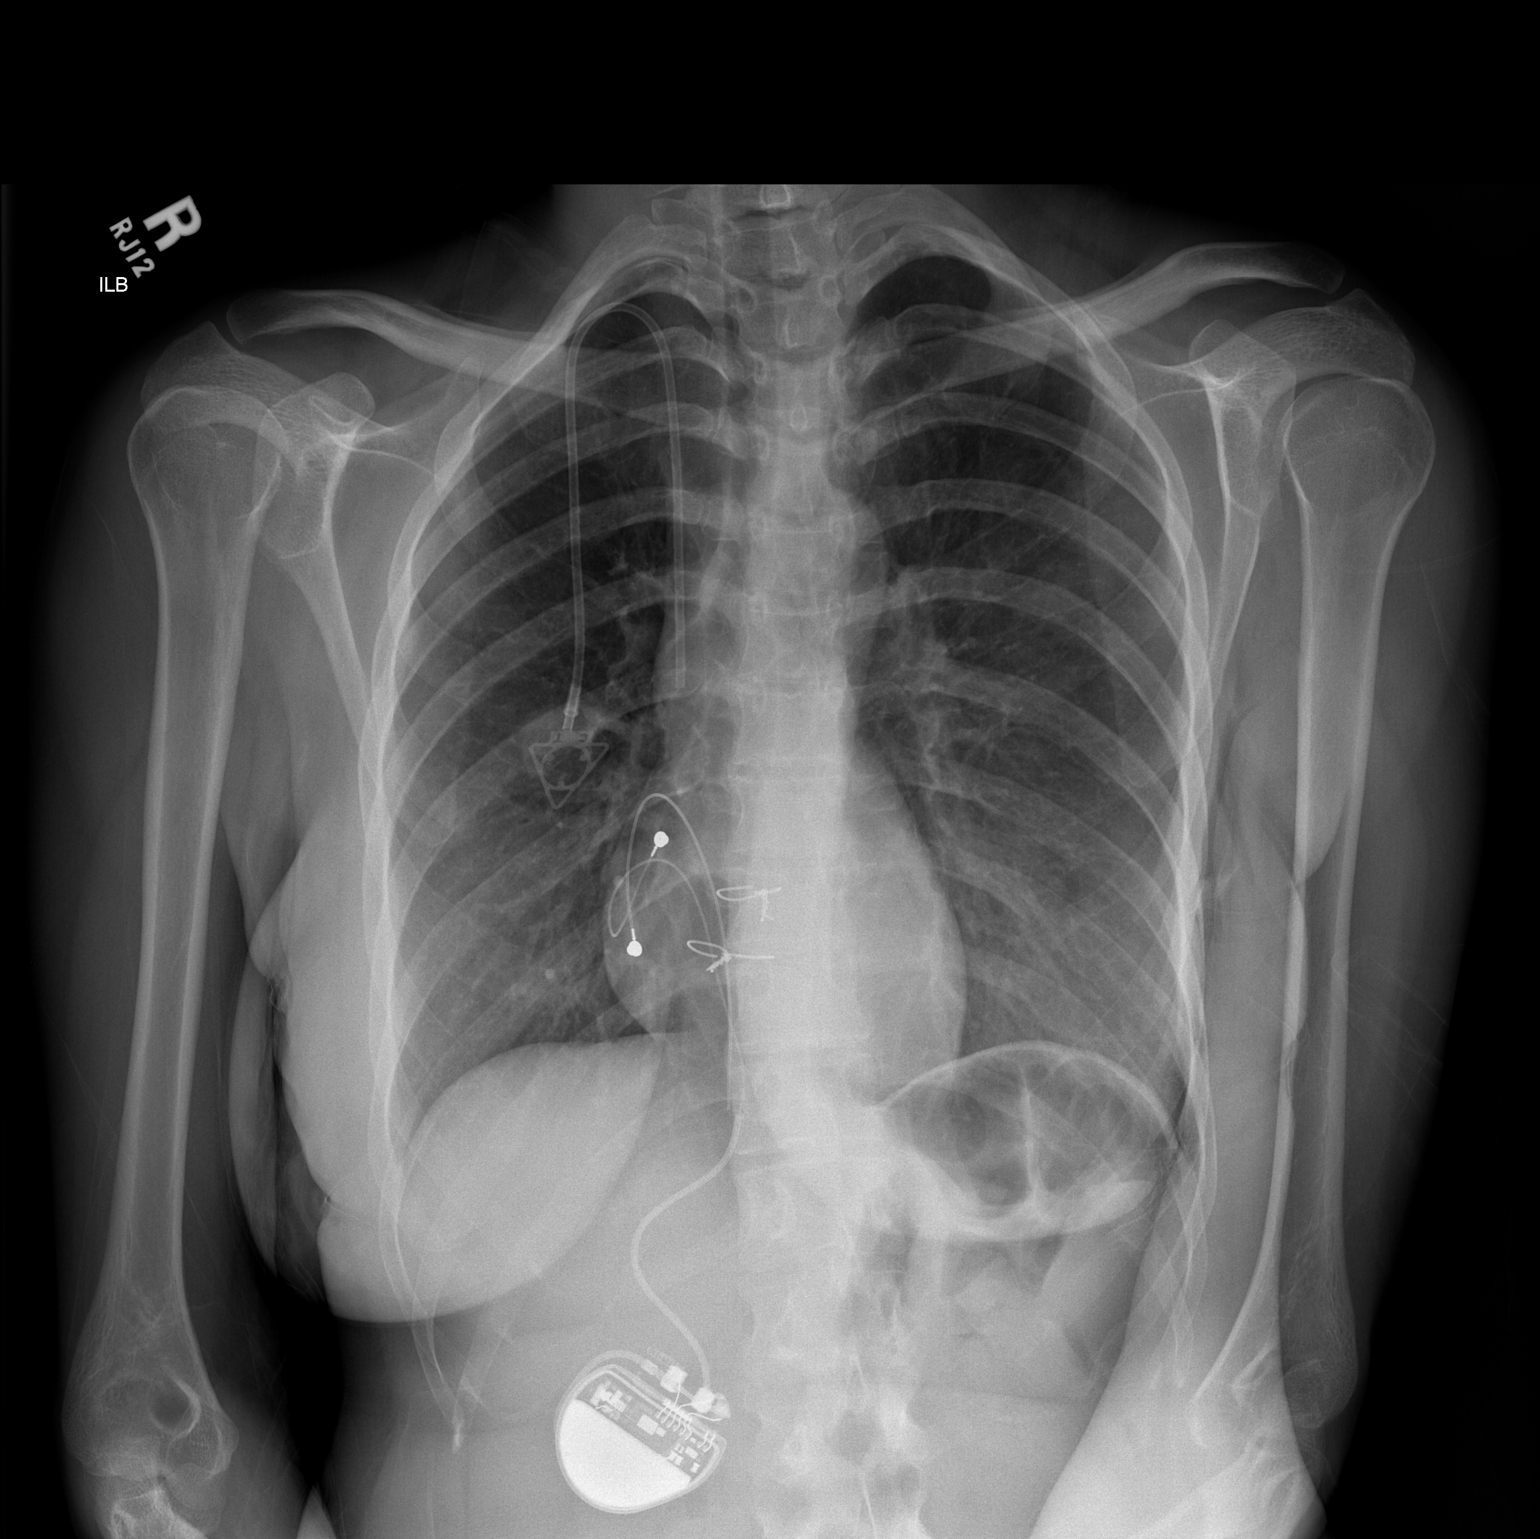
[im 2/2]
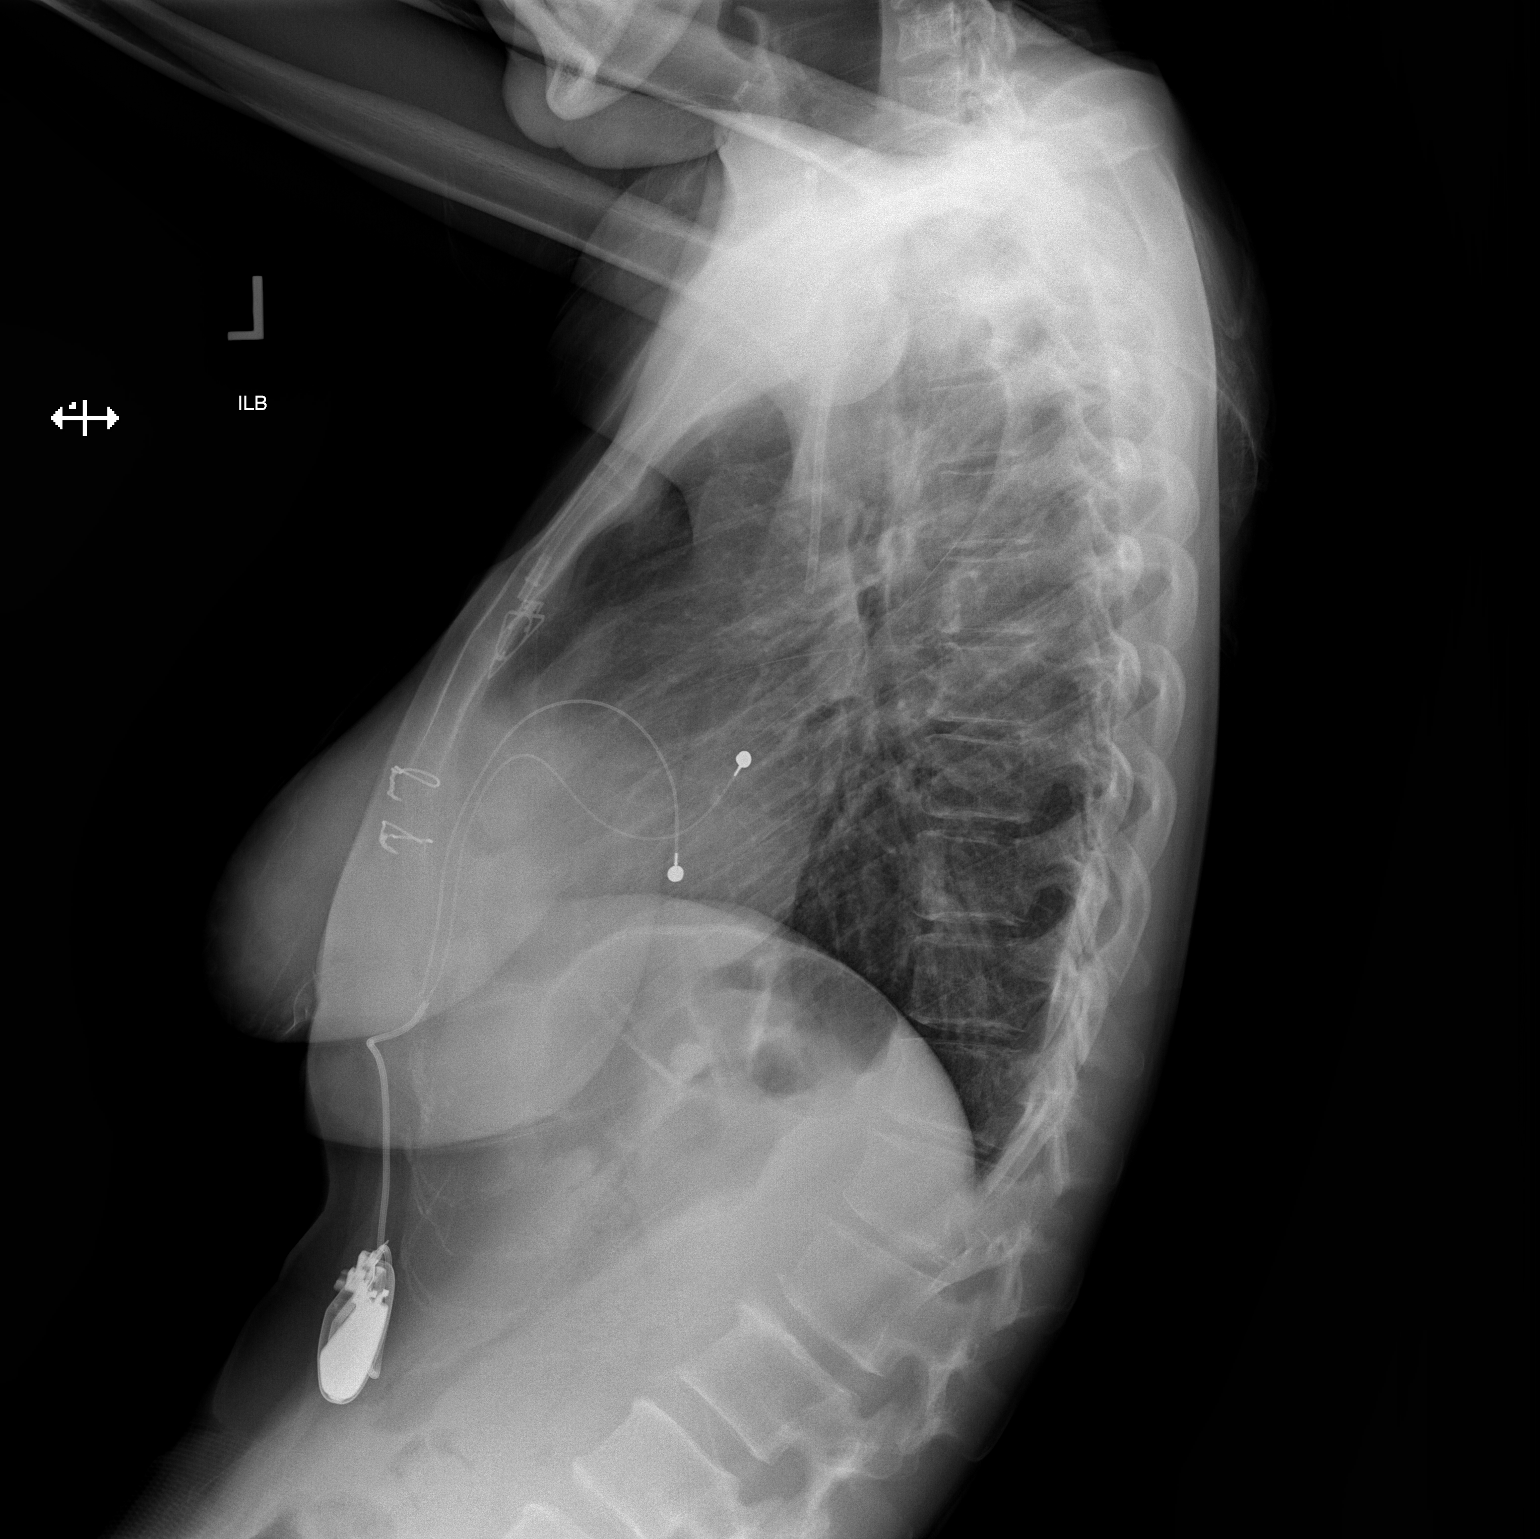

[2 of 2 positions shown; findings below may reference images not displayed]

FINDINGS: The lungs are well-expanded. There is no focal infiltrate. There is
no pleural effusion or pneumothorax. No pulmonary parenchymal
nodules or masses are demonstrated. The cardiac silhouette is normal
in size. The pulmonary vascularity is not engorged. Epicardial
pacing wires on the right are present and a pacemaker generator
overlies the right mid abdomen. A Port-A-Cath appliance is in place
with the tip of the catheter in the region of the midportion of the
SVC. The observed portions of the bony thorax exhibit no acute
abnormalities.
IMPRESSION: There is no evidence of pneumonia nor CHF nor other acute
cardiopulmonary disease.

## 2014-10-13 ENCOUNTER — Ambulatory Visit: Payer: Self-pay | Admitting: Family Medicine

## 2015-01-09 IMAGING — CR DG HIP (WITH OR WITHOUT PELVIS) 2-3V*L*
3 series · 3 of 3 positions shown · non-contrast
Comparison: None.

CLINICAL DATA: Left hip pain.

EXAM:
LEFT HIP - COMPLETE 2+ VIEW

[t pelvis ap]
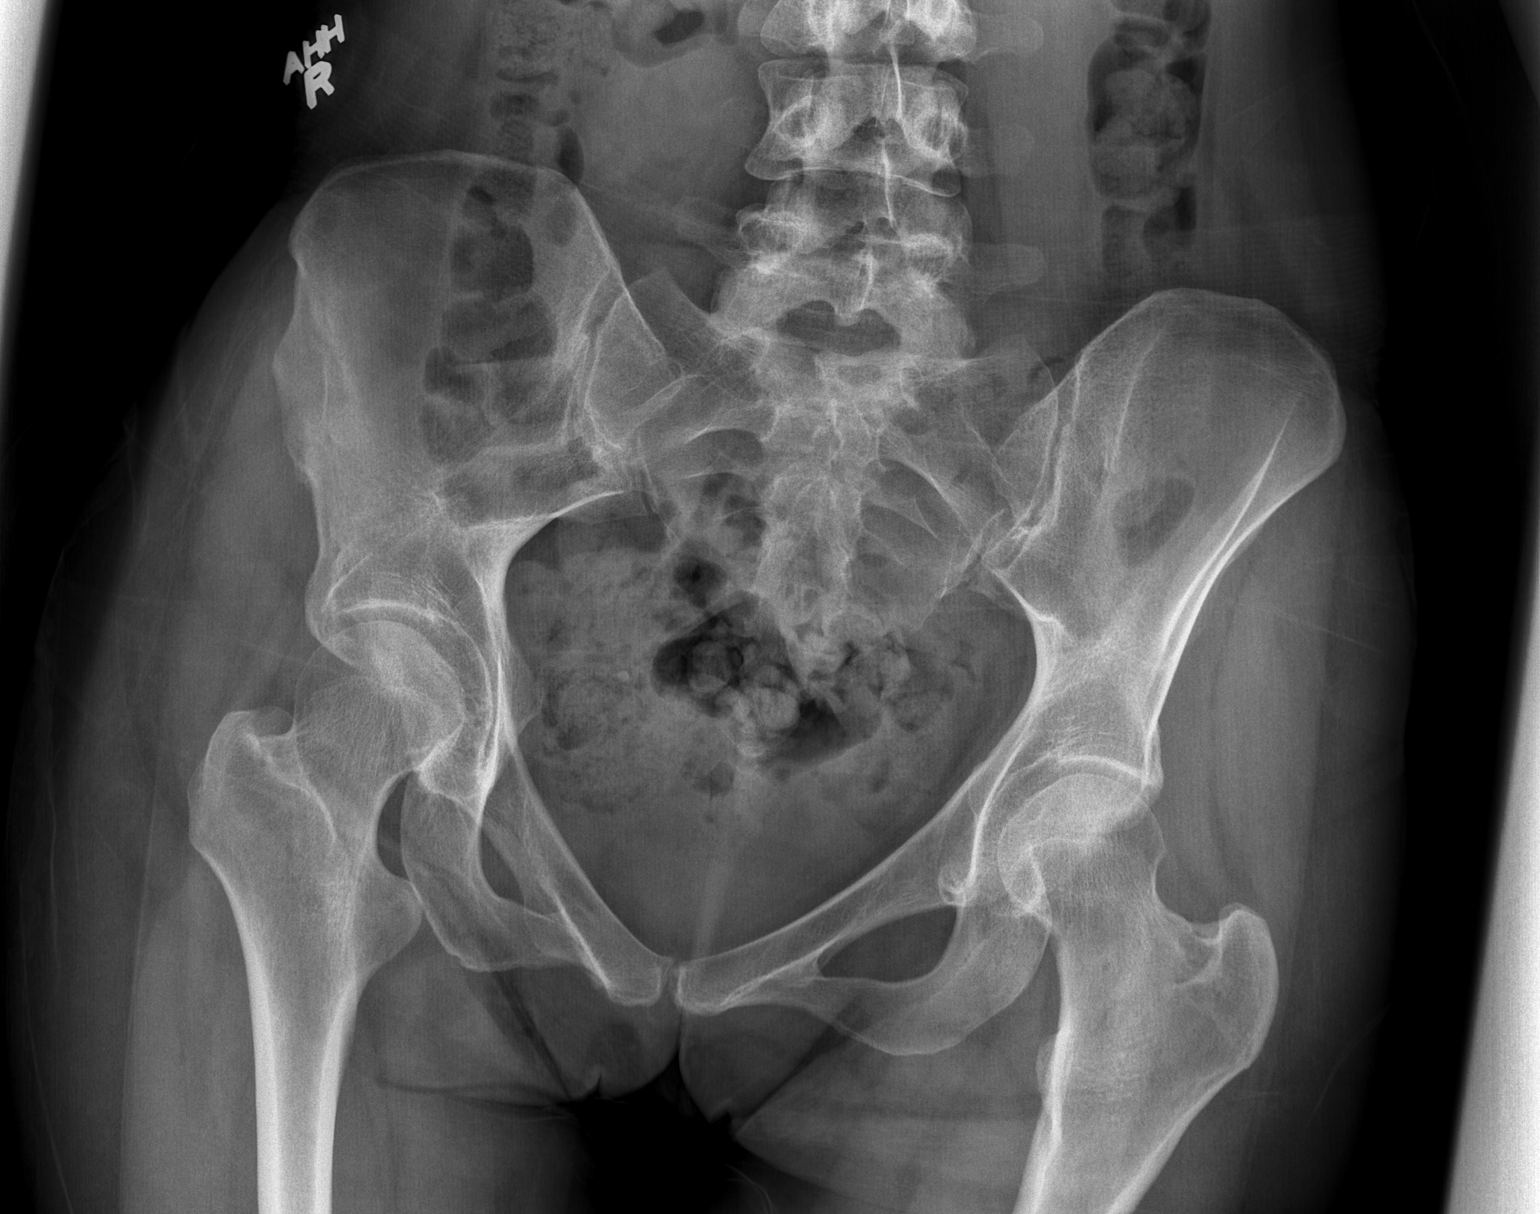

[t hip ap left]
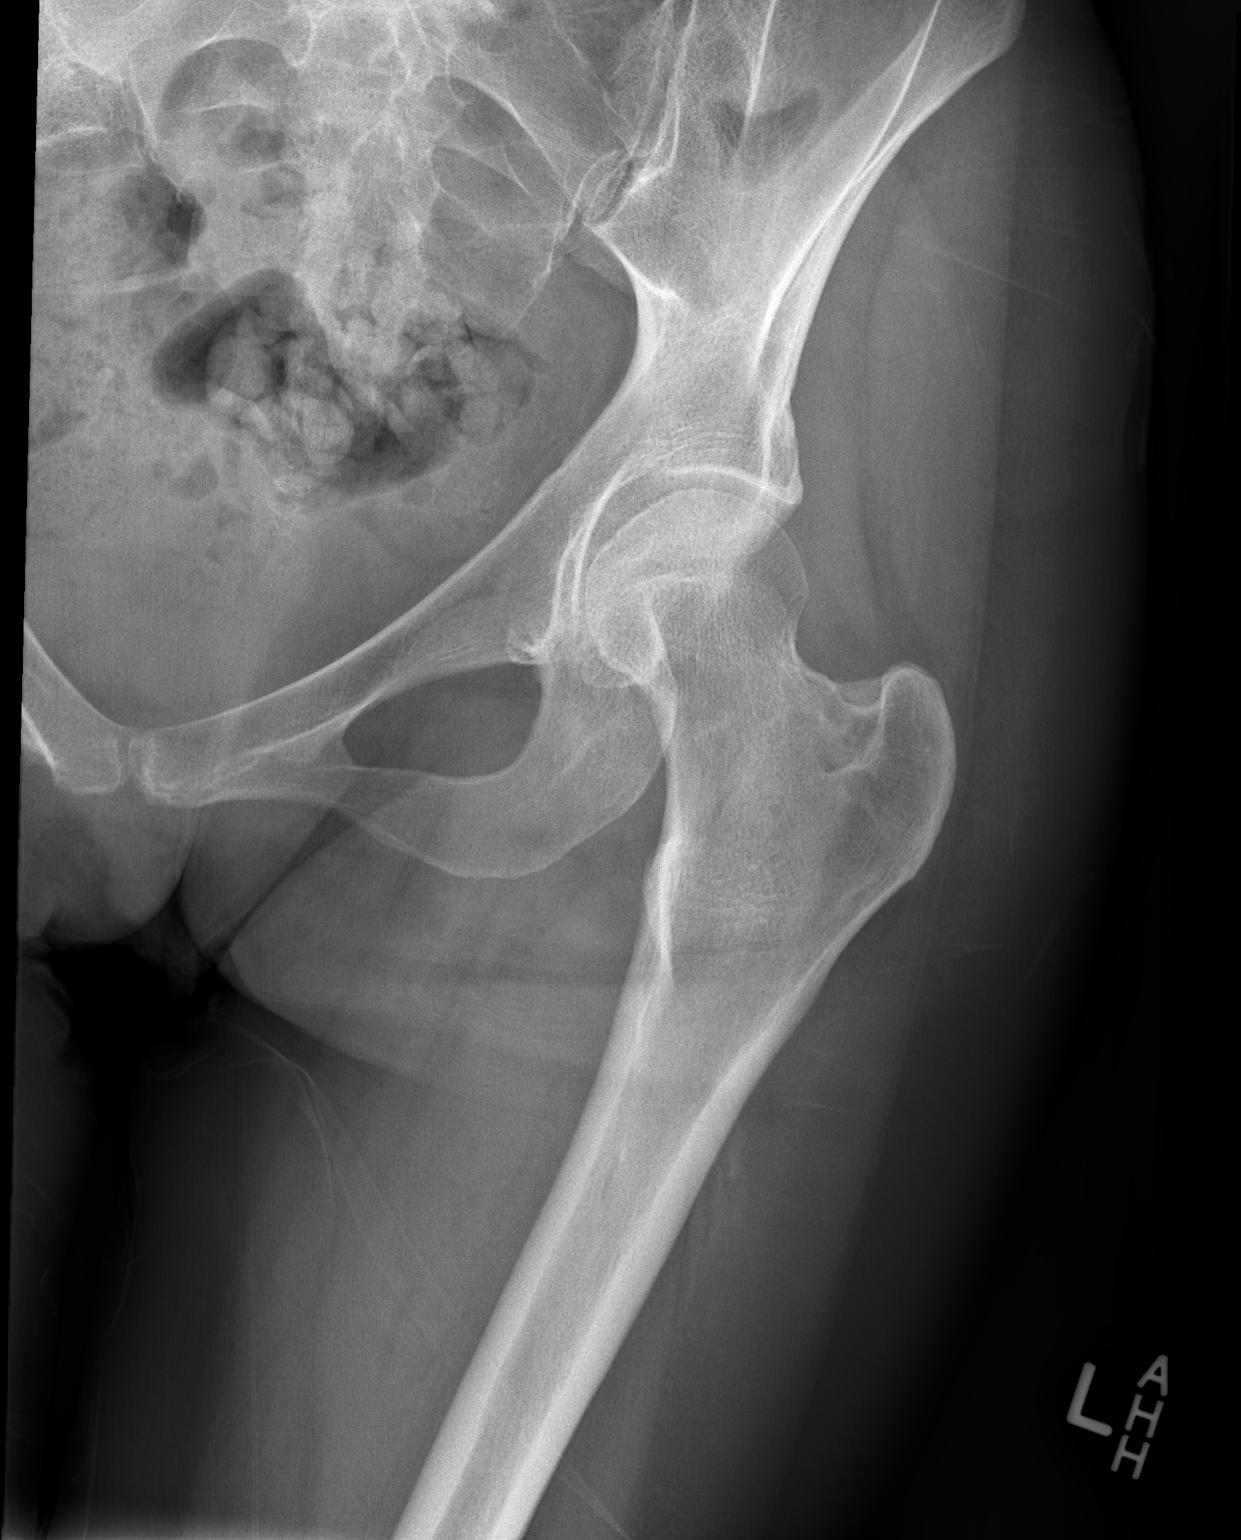

[t hip frog leg left]
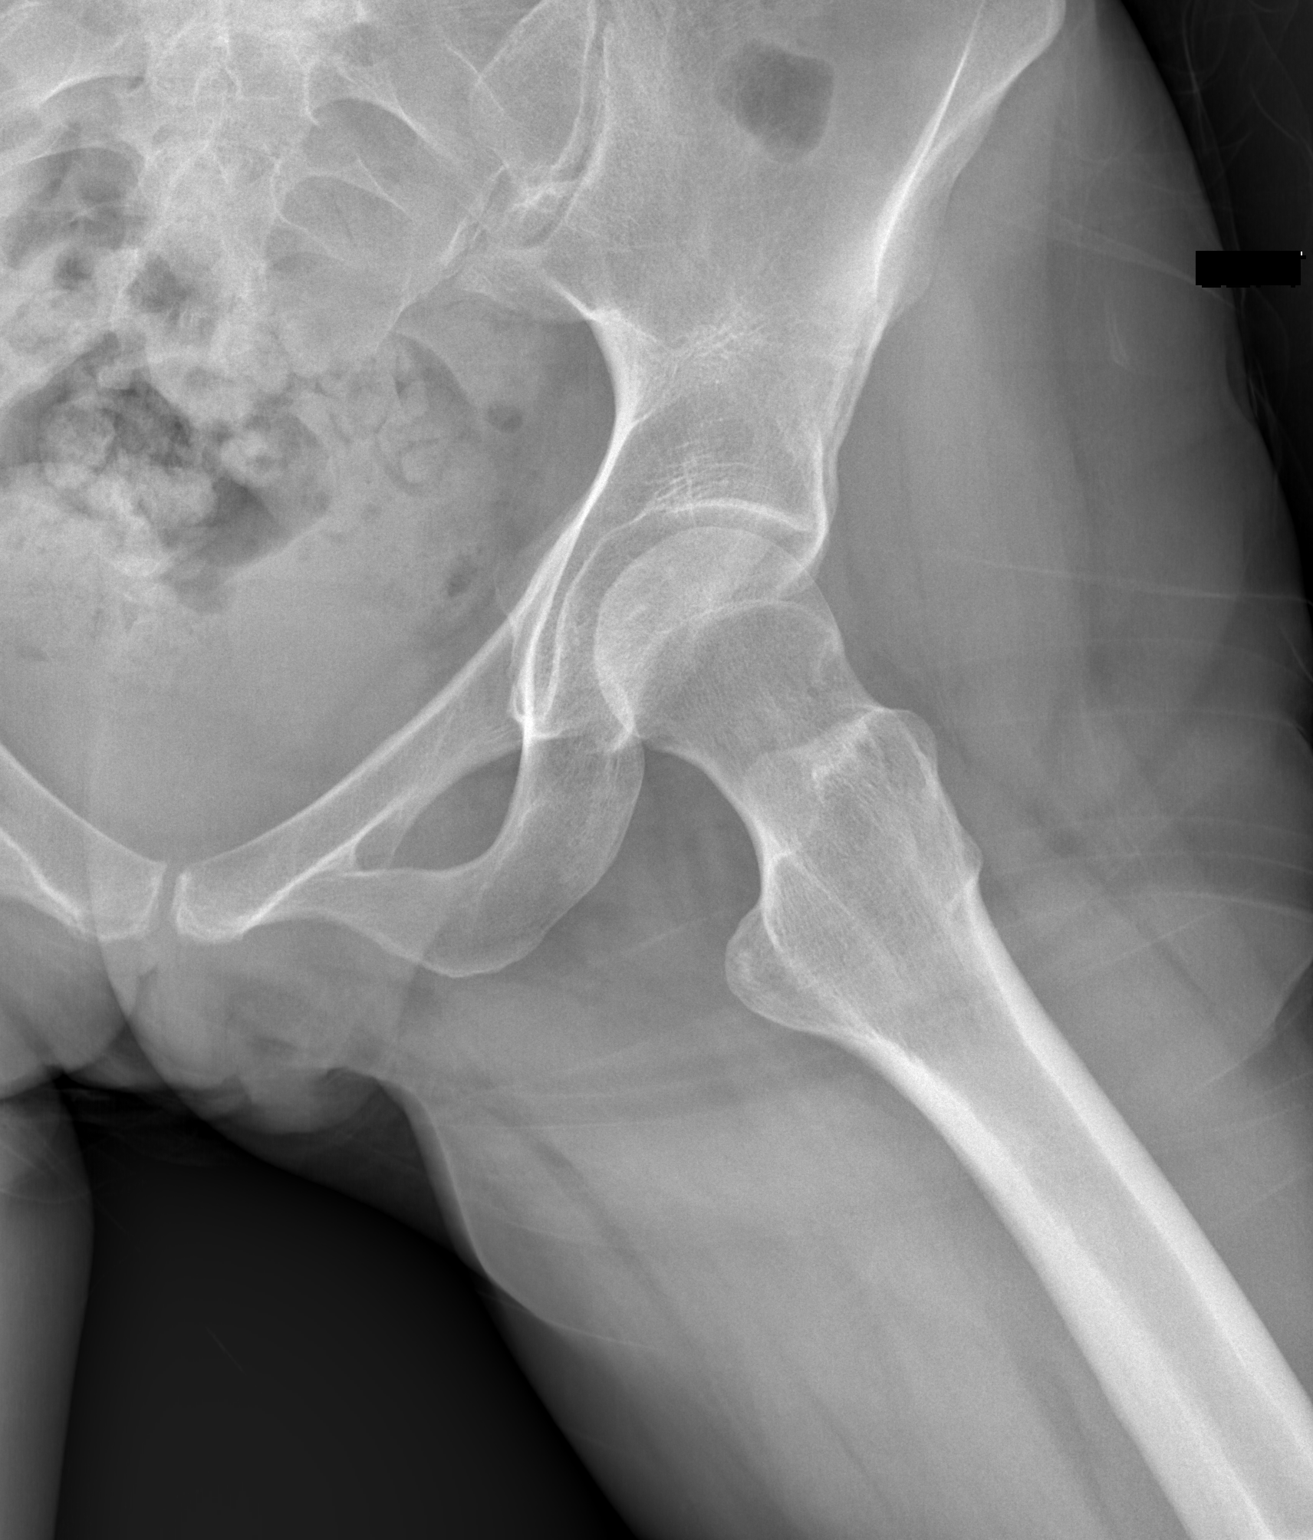

[3 of 3 positions shown; findings below may reference images not displayed]

FINDINGS: Hips both hips are normally located. No acute hip fracture or plain
film evidence of avascular necrosis. The pubic symphysis and SI
joints are intact. No definite pelvic fractures.
IMPRESSION: No acute bony findings.

## 2015-01-19 ENCOUNTER — Emergency Department: Payer: Self-pay | Admitting: Emergency Medicine

## 2015-01-19 ENCOUNTER — Ambulatory Visit: Payer: Self-pay | Admitting: Internal Medicine

## 2015-01-19 LAB — BASIC METABOLIC PANEL
Anion Gap: 8 (ref 7–16)
BUN: 12 mg/dL (ref 7–18)
CALCIUM: 8.3 mg/dL — AB (ref 8.5–10.1)
CHLORIDE: 107 mmol/L (ref 98–107)
Co2: 27 mmol/L (ref 21–32)
Creatinine: 0.75 mg/dL (ref 0.60–1.30)
EGFR (African American): >60
GLUCOSE: 89 mg/dL (ref 65–99)
Osmolality: 282 (ref 275–301)
POTASSIUM: 3.4 mmol/L — AB (ref 3.5–5.1)
SODIUM: 142 mmol/L (ref 136–145)

## 2015-01-19 LAB — MAGNESIUM: MAGNESIUM: 1 mg/dL — AB

## 2015-02-08 ENCOUNTER — Ambulatory Visit: Payer: Self-pay | Admitting: Nurse Practitioner

## 2015-02-22 ENCOUNTER — Emergency Department (HOSPITAL_COMMUNITY)
Admission: EM | Admit: 2015-02-22 | Discharge: 2015-02-22 | Disposition: A | Discharge status: Home / Self Care | Payer: Medicaid Other | Attending: Emergency Medicine | Admitting: Emergency Medicine

## 2015-02-22 ENCOUNTER — Encounter (HOSPITAL_COMMUNITY): Payer: Self-pay | Admitting: Emergency Medicine

## 2015-02-22 ENCOUNTER — Emergency Department (HOSPITAL_COMMUNITY): Payer: Medicaid Other

## 2015-02-22 DIAGNOSIS — G8929 Other chronic pain: Secondary | ICD-10-CM | POA: Diagnosis not present

## 2015-02-22 DIAGNOSIS — Z950 Presence of cardiac pacemaker: Secondary | ICD-10-CM | POA: Insufficient documentation

## 2015-02-22 DIAGNOSIS — G8918 Other acute postprocedural pain: Secondary | ICD-10-CM | POA: Insufficient documentation

## 2015-02-22 DIAGNOSIS — M25561 Pain in right knee: Secondary | ICD-10-CM | POA: Insufficient documentation

## 2015-02-22 DIAGNOSIS — Z8639 Personal history of other endocrine, nutritional and metabolic disease: Secondary | ICD-10-CM | POA: Insufficient documentation

## 2015-02-22 DIAGNOSIS — Z79899 Other long term (current) drug therapy: Secondary | ICD-10-CM | POA: Diagnosis not present

## 2015-02-22 MED ORDER — TRAMADOL HCL 50 MG PO TABS: 50.0000 mg | ORAL_TABLET | Freq: Four times a day (QID) | ORAL | Status: DC | PRN

## 2015-02-22 NOTE — ED Provider Notes (Signed)
CSN: [id number]     Arrival date & time 02/22/15  1531 History   First MD Initiated Contact with Patient 02/22/15 1601     Chief Complaint  Patient presents with  . Post-op Problem  . Knee Pain     (Consider location/radiation/quality/duration/timing/severity/associated sxs/prior Treatment) HPI   34 year old female with history of chronic back pain, POTTS, cerebral palsy who presents for evaluation of right knee pain. Patient reports she has recurrent bursitis to her right knee and has had her bursa removed by [facility] disease [facility] in [facility]. States that the healing process was goal well but for the past 2-3 weeks she has had intermittent worsening right knee pain and swelling that felt similar to prior bursitis. Pain is sharp, achy, waxing and waning, worsening when she have to crawl on the knees due to history of cerebral palsy, using wheelchair. She is requesting for an x-ray. She denies having any fever, or rash. She has noticed increased swelling. She tries taking her home medication include fever which has helped but pain still persist. She has not been seen by her orthopedic for this recently.    Past Medical History  Diagnosis Date  . Cerebral palsy   . Hypokalemia     now resolved  . 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  . Gitelman syndrome   . Chronic back pain   . Carpal tunnel syndrome   . Pacemaker    Past Surgical History  Procedure Laterality Date  . Pacemaker insertion    . Orthopedic surgery    . Abdominal hysterectomy    . Portacath placement     Family History  Problem Relation Age of Onset  . Diabetes Mother   . Hypertension Mother    History  Substance Use Topics  . Smoking status: Never Smoker   . Smokeless tobacco: Not on file  . Alcohol Use: No   OB History    No data available     Review of Systems  Constitutional: Negative for fever.  Musculoskeletal: Positive for joint swelling  and arthralgias.  Skin: Negative for rash.      Allergies  Cymbalta; Gabapentin; Prozac; and Sulfa antibiotics  Home Medications   Prior to Admission medications   Medication Sig Start Date End Date Taking? Authorizing Provider  albuterol (PROVENTIL) (2.5 MG/3ML) 0.083% nebulizer solution Take 2.5 mg by nebulization every 6 (six) hours as needed for wheezing or shortness of breath.    Historical Provider, MD  magnesium oxide (MAG-OX) 400 MG tablet Take 400 mg by mouth 2 (two) times daily.     Historical Provider, MD  methocarbamol (ROBAXIN) 500 MG tablet Take 1 tablet (500 mg total) by mouth every 8 (eight) hours as needed for muscle spasms. 07/07/14   Nicole Pisciotta, PA-C  methylphenidate (RITALIN) 20 MG tablet Take 20 mg by mouth 3 (three) times daily as needed (Patient Preference).     Historical Provider, MD  pregabalin (LYRICA) 50 MG capsule Take 50 mg by mouth 3 (three) times daily.     Historical Provider, MD   BP 128/78 mmHg  Pulse 92  Temp(Src) 98 F (36.7 C) (Oral)  Resp 27  SpO2 100%  LMP 06/09/2012 Physical Exam  Constitutional:  Small framed Caucasian female history of cerebral palsy, sitting in wheelchair, appears to be in no acute distress.  Musculoskeletal: She exhibits tenderness (Right knee: Well-healing midline surgical scar to anterior knee without evidence of infection. Point tenderness  to tibial tuberosity with mild surrounding swelling but no erythema or warmth. Normal knee flexion and extension and no joint laxity. ).  No tenderness to right hip or Right ankle, and with intact distal pulses  Nursing note and vitals reviewed.   ED Course  Procedures (including critical care time)  Gradual onset of right knee pain in a patient with history of bursitis status post surgery. On exam patient does have point tenderness to her tibial tuberosity, suspect infrapatellar bursitis. Patient request for x-ray. X-ray ordered. Low suspicion for joint infection. I offer  pain medication, patient declined at this time.  6:08 PM Xray neg for acute pathology.  RICE therapy discussed, ACE dressing applied.  Suspect bursitis.  Doubt infection.  Pt to f/u with her orthopedist.  Printout of xray given as requested.  Return precaution discussed.   Labs Review Labs Reviewed - No data to display  Imaging Review Dg Knee Complete 4 Views Right  02/22/2015   CLINICAL DATA:  Pt states that she had knee surgery in January and has had rt anterior knee pain and swelling x 2 wks. H/o cerebral palsy and bursitis in bilateral knees.  EXAM: RIGHT KNEE - COMPLETE 4+ VIEW  COMPARISON:  None.  FINDINGS: There is no evidence of fracture, dislocation, or joint effusion. There is no evidence of arthropathy or other focal bone abnormality. Soft tissues are unremarkable.  IMPRESSION: Negative.   Electronically Signed   By: Elberta Fortis [name]   On: 02/22/2015 17:27     EKG Interpretation None      MDM   Final diagnoses:  Right knee pain    BP 110/77 mmHg  Pulse 85  Temp(Src) 98 F (36.7 C) (Oral)  Resp 18  SpO2 100%  LMP 06/09/2012  I have reviewed nursing notes and vital signs. I personally reviewed the imaging tests through PACS system  I reviewed available ER/hospitalization records thought the EMR     Fayrene Helper, PA-C 02/22/15 1809  Nelva Nay, MD 02/23/15 [phone]

## 2015-02-22 NOTE — ED Notes (Signed)
Pt states that she had knee surgery in January and has had rt knee pain and swelling x 2 wks.

## 2015-02-22 NOTE — Discharge Instructions (Signed)
Elastic Bandage and RICE °Elastic bandages come in different shapes and sizes. They perform different functions. Your caregiver will help you to decide what is best for your protection, recovery, or rehabilitation following an injury. The following are some general tips to help you use an elastic bandage. °· Use the bandage as directed by the maker of the bandage you are using. °· Do not wrap it too tight. This may cut off the circulation of the arm or leg below the bandage. °· If part of your body beyond the bandage becomes blue, numb, or swollen, it is too tight. Loosen the bandage as needed to prevent these problems. °· See your caregiver or trainer if the bandage seems to be making your problems worse rather than better. °Bandages may be a reminder to you that you have an injury. However, they provide very little support. The few pounds of support they provide are minor considering the pressure it takes to injure a joint or tear ligaments. Therefore, the joint will not be able to handle all of the wear and tear it could before the injury. °The routine care of many injuries includes Rest, Ice, Compression, and Elevation (RICE). °· Rest is required to allow your body to heal. Generally, routine activities can be resumed when comfortable. Injured tendons and bones take about 6 weeks to heal. °· Icing the injury helps keep the swelling down and reduces pain. Do not apply ice directly to the skin. Put ice in a plastic bag. Place a towel between the skin and the bag. This will prevent frostbite to the skin. Apply ice bags to the injured area for 15-20 minutes, every 2 hours while awake. Do this for the first 24 to 48 hours, then as directed by your caregiver. °· Compression helps keep swelling down, gives support, and helps with discomfort. If an elastic bandage has been applied today, it should be removed and reapplied every 3 to 4 hours. It should not be applied tightly, but firmly enough to keep swelling down.  Watch fingers or toes for swelling, bluish discoloration, coldness, numbness, or increased pain. If any of these problems occur, remove the bandage and reapply it more loosely. If these problems persist, contact your caregiver. °· Elevation helps reduce swelling and decreases pain. The injured area (arms, hands, legs, or feet) should be placed near to or above the heart (center of the chest) if able. °Persistent pain and inability to use the injured area for more than 2 to 3 days are warning signs. You should see a caregiver for a follow-up visit as soon as possible. Initially, a minor broken bone (hairline fracture) may not be seen on X-rays. It may take 7 to 10 days to finally show up. Continued pain and swelling show that further evaluation and/or X-rays are needed. Make a follow-up visit with your caregiver. A specialist in reading X-rays (radiologist) will read your X-rays again. °Finding out the results of your test °Not all test results are available during your visit. If your test results are not back during the visit, make an appointment with your caregiver to find out the results. Do not assume everything is normal if you have not heard from your caregiver or the medical facility. It is important for you to follow up on all of your test results. °Document Released: 05/21/2002 Document Revised: 02/21/2012 Document Reviewed: 04/01/2008 °Exit[employer]® Patient Information ©2015 [employer]. This information is not intended to replace advice given to you by your health care provider. Make sure   you discuss any questions you have with your health care provider. ° °

## 2015-03-02 IMAGING — US US EXTREM LOW VENOUS*R*
1 series · 13 of 24 positions shown · non-contrast
Comparison: None.

CLINICAL DATA: Post knee surgery (08/20/2014) now with right lower
extremity pain and swelling. Evaluate for DVT.



[Series 1: us extrem low venous*right* · 0.05mm/px · 13 of 40 slices shown]
[im 1/40]
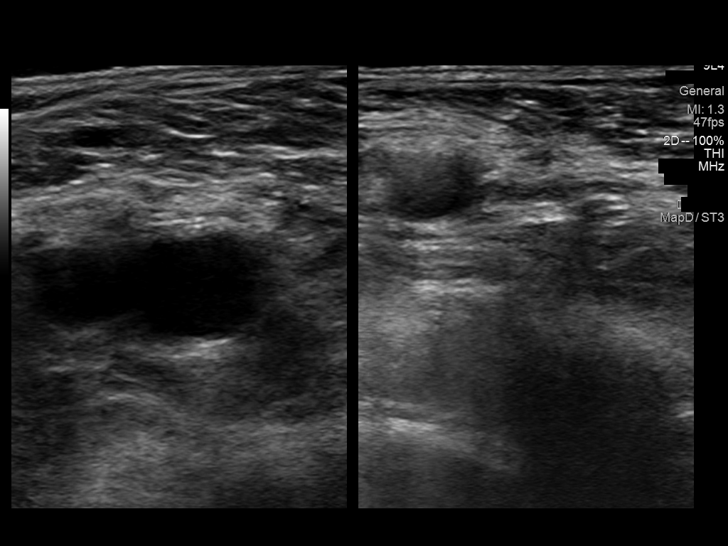
[im 4/40]
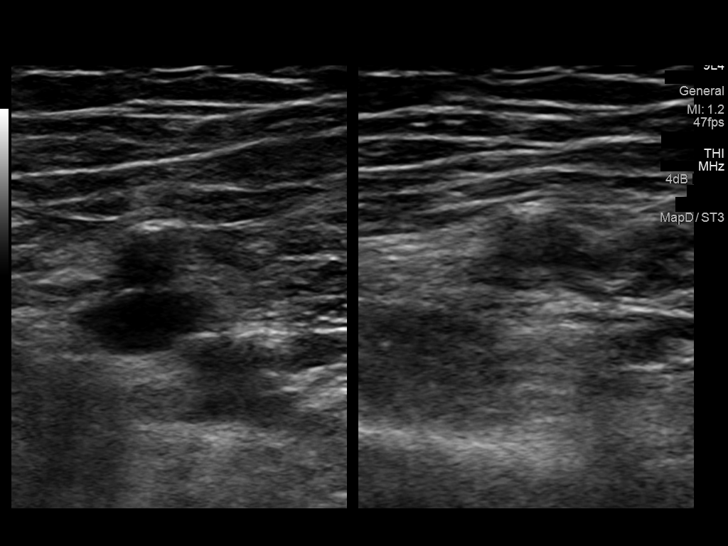
[im 7/40]
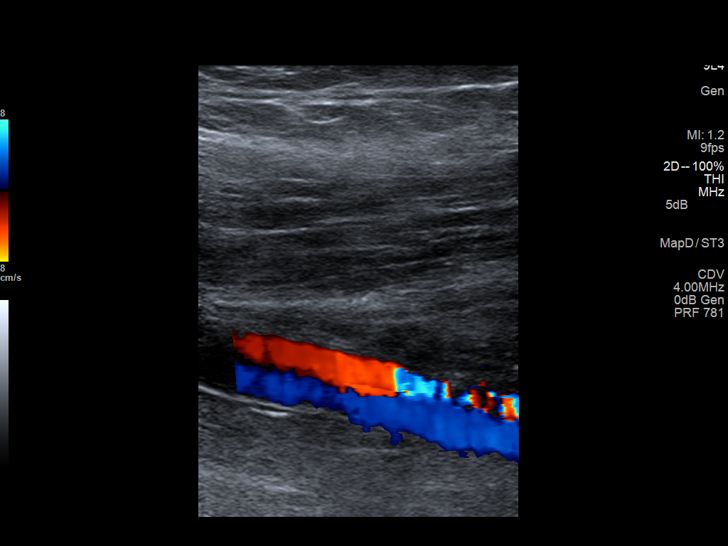
[im 11/40]
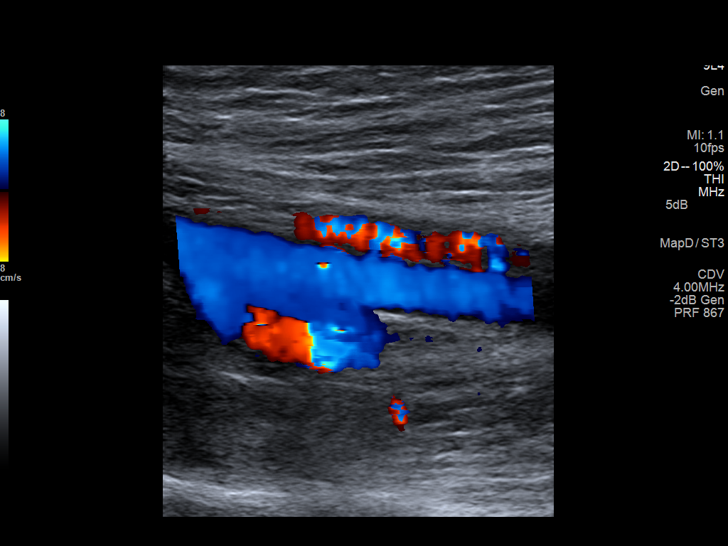
[im 14/40]
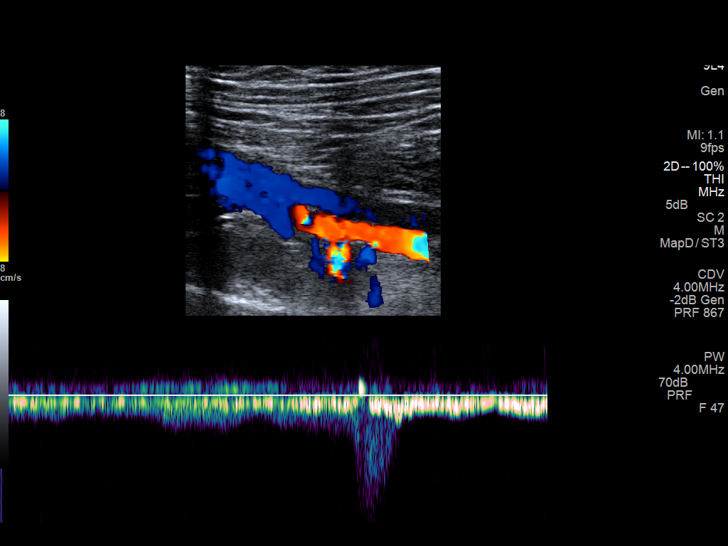
[im 17/40]
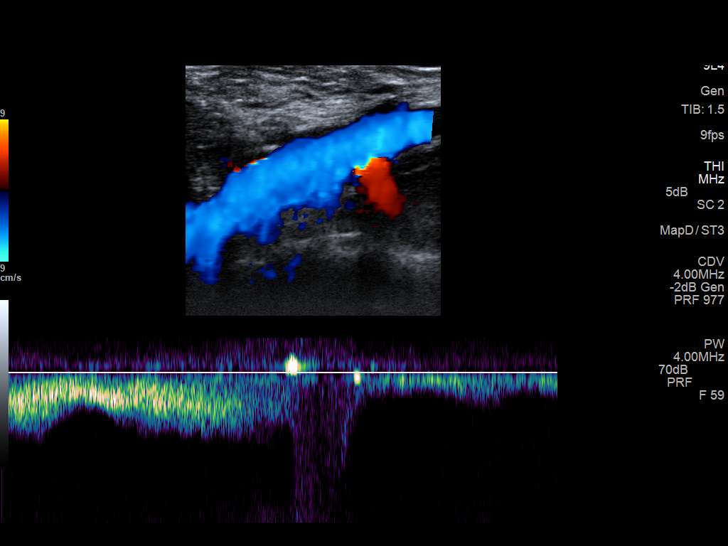
[im 21/40]
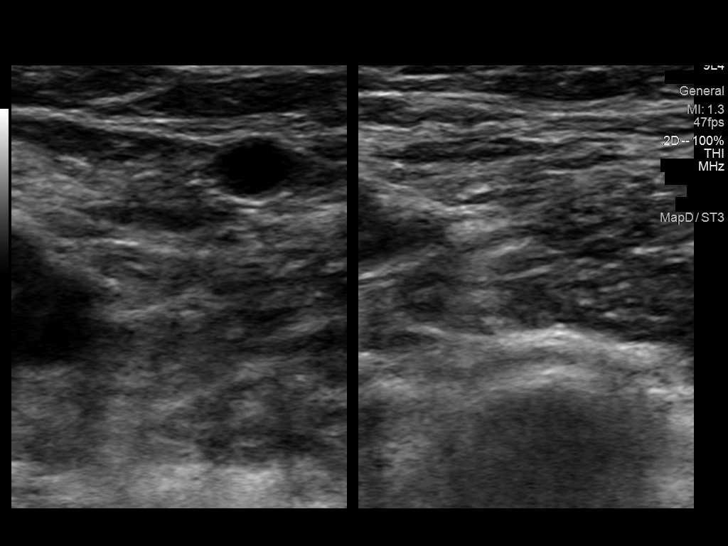
[im 23/40]
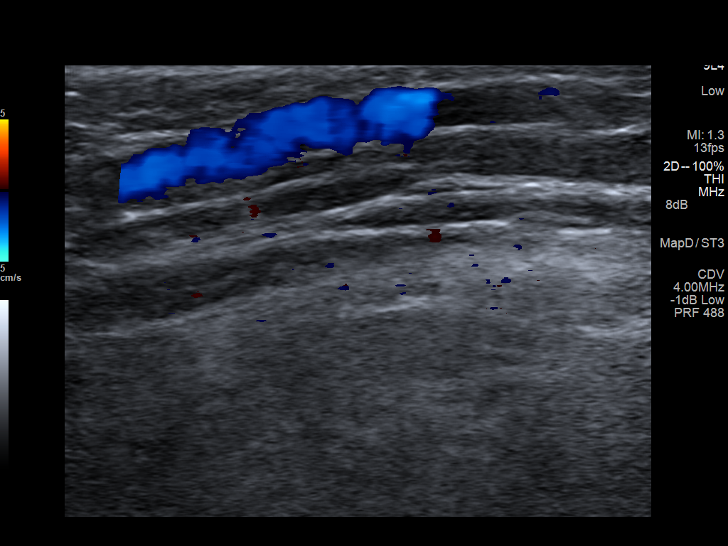
[im 26/40]
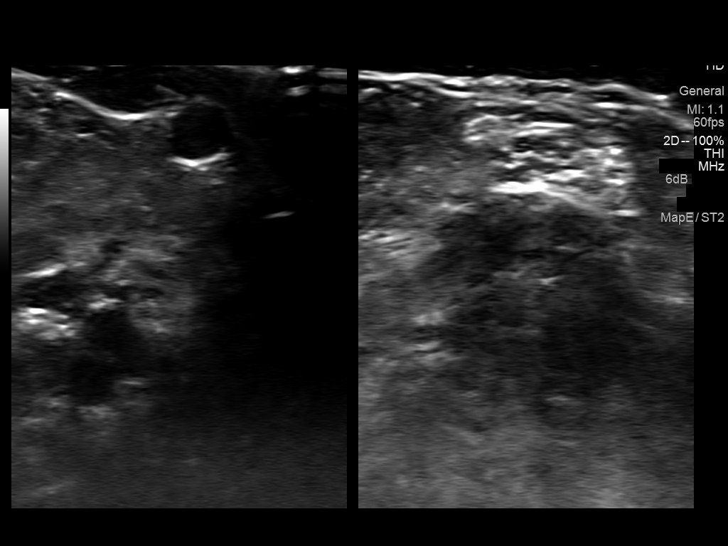
[im 29/40]
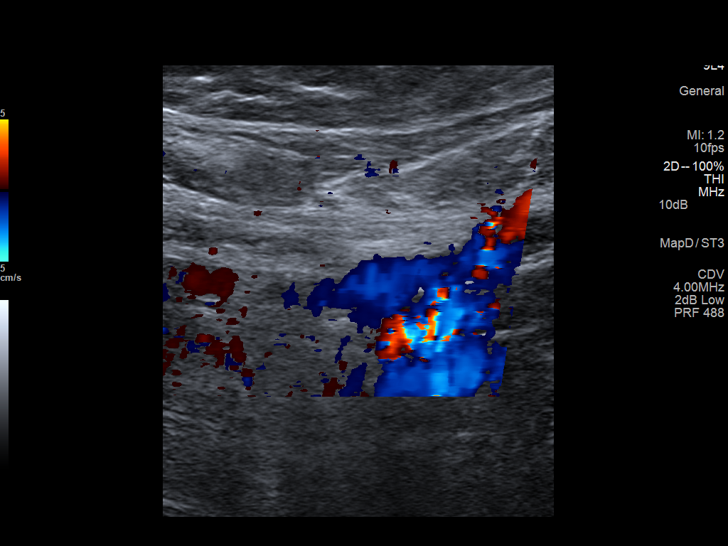
[im 33/40]
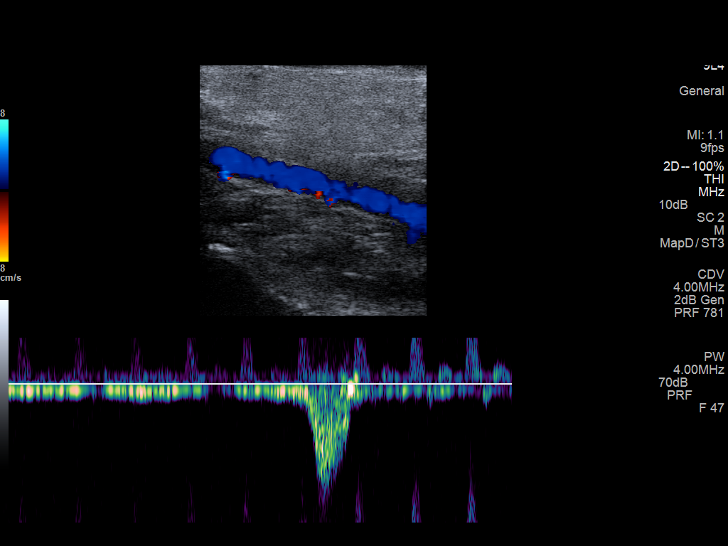
[im 36/40]
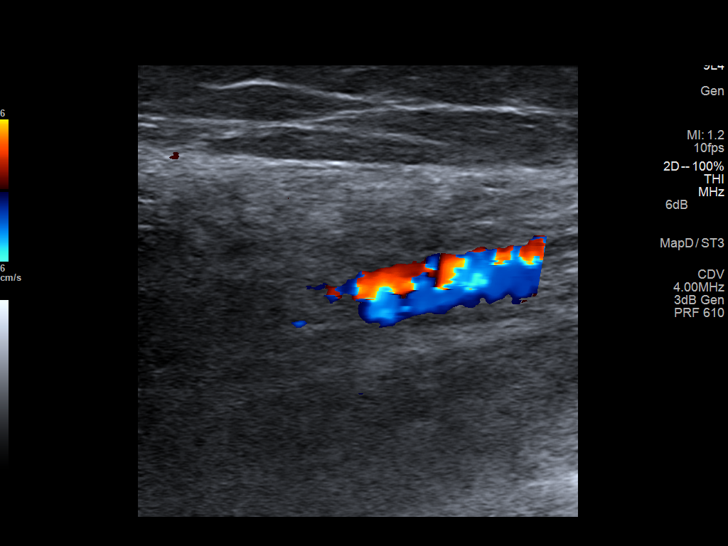
[im 40/40]
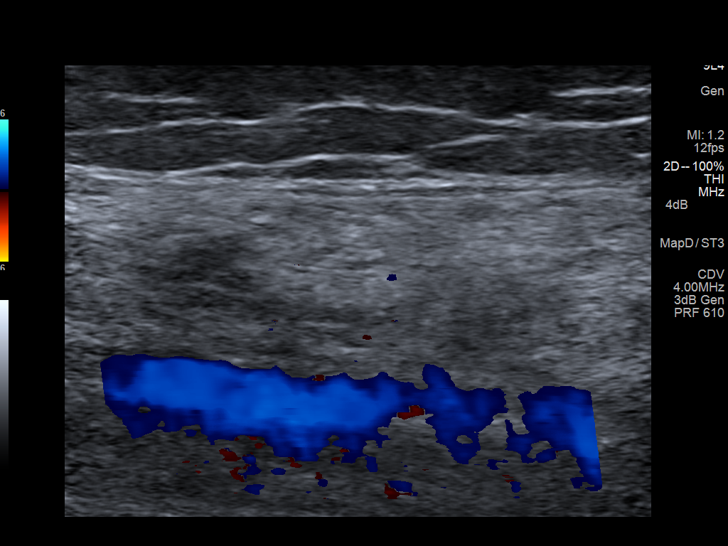

[13 of 24 positions shown; findings below may reference images not displayed]

FINDINGS: Common Femoral Vein: No evidence of thrombus. Normal
compressibility, respiratory phasicity and response to augmentation.

Saphenofemoral Junction: No evidence of thrombus. Normal
compressibility and flow on color Doppler imaging.

Profunda Femoral Vein: No evidence of thrombus. Normal
compressibility and flow on color Doppler imaging.

Femoral Vein: No evidence of thrombus. Normal compressibility,
respiratory phasicity and response to augmentation.

Popliteal Vein: No evidence of thrombus. Normal compressibility,
respiratory phasicity and response to augmentation.

Calf Veins: No evidence of thrombus. Normal compressibility and flow
on color Doppler imaging.

Superficial Great Saphenous Vein: No evidence of thrombus. Normal
compressibility and flow on color Doppler imaging.

Venous Reflux:  None.

Other Findings:  None.
IMPRESSION: No evidence of DVT within the right lower extremity.

## 2015-03-08 ENCOUNTER — Emergency Department (HOSPITAL_COMMUNITY): Payer: Medicaid Other

## 2015-03-08 ENCOUNTER — Encounter (HOSPITAL_COMMUNITY): Payer: Self-pay

## 2015-03-08 ENCOUNTER — Emergency Department (HOSPITAL_COMMUNITY)
Admission: EM | Admit: 2015-03-08 | Discharge: 2015-03-08 | Disposition: A | Discharge status: Home / Self Care | Payer: Medicaid Other | Attending: Emergency Medicine | Admitting: Emergency Medicine

## 2015-03-08 DIAGNOSIS — G8929 Other chronic pain: Secondary | ICD-10-CM | POA: Insufficient documentation

## 2015-03-08 DIAGNOSIS — Z8679 Personal history of other diseases of the circulatory system: Secondary | ICD-10-CM | POA: Diagnosis not present

## 2015-03-08 DIAGNOSIS — Z950 Presence of cardiac pacemaker: Secondary | ICD-10-CM | POA: Diagnosis not present

## 2015-03-08 DIAGNOSIS — R079 Chest pain, unspecified: Secondary | ICD-10-CM | POA: Diagnosis not present

## 2015-03-08 DIAGNOSIS — Z79899 Other long term (current) drug therapy: Secondary | ICD-10-CM | POA: Diagnosis not present

## 2015-03-08 LAB — BASIC METABOLIC PANEL
ANION GAP: 10 (ref 5–15)
BUN: 15 mg/dL (ref 6–23)
CO2: 26 mmol/L (ref 19–32)
Calcium: 8.7 mg/dL (ref 8.4–10.5)
Chloride: 107 mmol/L (ref 96–112)
Creatinine, Ser: 0.69 mg/dL (ref 0.50–1.10)
GFR calc Af Amer: >90 mL/min (ref >90)
GLUCOSE: 82 mg/dL (ref 70–99)
Potassium: 2.9 mmol/L — ABNORMAL LOW (ref 3.5–5.1)
Sodium: 143 mmol/L (ref 135–145)

## 2015-03-08 LAB — I-STAT TROPONIN, ED: Troponin i, poc: 0 ng/mL (ref 0.00–0.08)

## 2015-03-08 LAB — MAGNESIUM: Magnesium: 1 mg/dL — ABNORMAL LOW (ref 1.5–2.5)

## 2015-03-08 LAB — CBC WITH DIFFERENTIAL/PLATELET
Basophils Absolute: 0.1 10*3/uL (ref 0.0–0.1)
Basophils Relative: 1 % (ref 0–1)
EOS ABS: 0 10*3/uL (ref 0.0–0.7)
EOS PCT: 0 % (ref 0–5)
HCT: 37.6 % (ref 36.0–46.0)
Hemoglobin: 12.9 g/dL (ref 12.0–15.0)
LYMPHS ABS: 2.4 10*3/uL (ref 0.7–4.0)
Lymphocytes Relative: 31 % (ref 12–46)
MCH: 29.6 pg (ref 26.0–34.0)
MCHC: 34.3 g/dL (ref 30.0–36.0)
MCV: 86.2 fL (ref 78.0–100.0)
MONO ABS: 0.5 10*3/uL (ref 0.1–1.0)
Monocytes Relative: 6 % (ref 3–12)
NEUTROS PCT: 62 % (ref 43–77)
Neutro Abs: 4.9 10*3/uL (ref 1.7–7.7)
PLATELETS: 140 10*3/uL — AB (ref 150–400)
RBC: 4.36 MIL/uL (ref 3.87–5.11)
RDW: 13 % (ref 11.5–15.5)
WBC: 7.8 10*3/uL (ref 4.0–10.5)

## 2015-03-08 MED ORDER — KETOROLAC TROMETHAMINE 30 MG/ML IJ SOLN
30.0000 mg | Freq: Once | INTRAMUSCULAR | Status: DC
  Filled 2015-03-08: qty 1

## 2015-03-08 MED ORDER — ASPIRIN 81 MG PO CHEW
324.0000 mg | CHEWABLE_TABLET | Freq: Once | ORAL | Status: AC
  Administered 2015-03-08: 324 mg via ORAL
  Filled 2015-03-08: qty 4

## 2015-03-08 MED ORDER — MAGNESIUM SULFATE 2 GM/50ML IV SOLN
2.0000 g | Freq: Once | INTRAVENOUS | Status: DC
  Filled 2015-03-08: qty 50

## 2015-03-08 MED ORDER — POTASSIUM CHLORIDE CRYS ER 20 MEQ PO TBCR
40.0000 meq | EXTENDED_RELEASE_TABLET | Freq: Once | ORAL | Status: AC
  Administered 2015-03-08: 40 meq via ORAL
  Filled 2015-03-08: qty 2

## 2015-03-08 NOTE — ED Notes (Signed)
PA at bedside.

## 2015-03-08 NOTE — ED Provider Notes (Signed)
CSN: [id number]     Arrival date & time 03/08/15  1503 History   First MD Initiated Contact with Patient 03/08/15 1519     Chief Complaint  Patient presents with  . Chest Pain     (Consider location/radiation/quality/duration/timing/severity/associated sxs/prior Treatment) HPI Comments: Patient with a history of Cerebral Palsy, Sick Sinus Syndrome, POTS, with a pacemaker presents today with a chief complaint of chest pain.  She reports that the pain is located right anterior chest and does not radiate.  Pain has been intermittent since last evening.  Pain is not associated with eating or exertion.  She reports onset of pain while lying down yesterday.  Pain worse with movement.  She reports she has had similar pain in the past when her Magnesium is low.  Her mother reports that she has had a low Magnesium in the past and requires IV Magnesium frequently.  She had been getting regular Magnesium infusions, but has not had a Mg infusion in the past month.  She denies SOB, fever, chills, nausea, vomiting, abdominal pain, dizziness, syncope, or LE edema.  She does report an occasional cough, which she associates with seasonal allergies.    Patient is a 34 y.o. female presenting with chest pain.  Chest Pain   Past Medical History  Diagnosis Date  . Cerebral palsy   . Hypokalemia     now resolved  . 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  . Gitelman syndrome   . Chronic back pain   . Carpal tunnel syndrome   . Pacemaker    Past Surgical History  Procedure Laterality Date  . Pacemaker insertion    . Orthopedic surgery    . Abdominal hysterectomy    . Portacath placement     Family History  Problem Relation Age of Onset  . Diabetes Mother   . Hypertension Mother    History  Substance Use Topics  . Smoking status: Never Smoker   . Smokeless tobacco: Not on file  . Alcohol Use: No   OB History    No data available     Review of Systems   Cardiovascular: Positive for chest pain.  All other systems reviewed and are negative.     Allergies  Cymbalta; Gabapentin; Prozac; and Sulfa antibiotics  Home Medications   Prior to Admission medications   Medication Sig Start Date End Date Taking? Authorizing Provider  methylphenidate (RITALIN) 20 MG tablet Take 20 mg by mouth 3 (three) times daily as needed (Patient Preference).    Yes Historical Provider, MD  pregabalin (LYRICA) 50 MG capsule Take 50 mg by mouth 3 (three) times daily.    Yes Historical Provider, MD  methocarbamol (ROBAXIN) 500 MG tablet Take 1 tablet (500 mg total) by mouth every 8 (eight) hours as needed for muscle spasms. Patient not taking: Reported on 02/22/2015 07/07/14   Joni ReiningNicole Pisciotta, PA-C  traMADol (ULTRAM) 50 MG tablet Take 1 tablet (50 mg total) by mouth every 6 (six) hours as needed for moderate pain. 02/22/15   Fayrene HelperBowie Tran, PA-C   BP 119/74 mmHg  Pulse 89  Temp(Src) 98.3 F (36.8 C) (Oral)  Resp 18  SpO2 100%  LMP 06/09/2012 Physical Exam  Constitutional: She appears well-developed and well-nourished.  HENT:  Head: Normocephalic and atraumatic.  Neck: Normal range of motion. Neck supple.  Cardiovascular: Normal rate, regular rhythm and normal heart sounds.   Pulmonary/Chest: Effort normal and breath sounds normal.  Abdominal: Soft. There is no  tenderness.  Musculoskeletal: Normal range of motion.  No LE edema or erythema bilaterally  Neurological: She is alert.  Skin: Skin is warm and dry.  Psychiatric: She has a normal mood and affect.  Nursing note and vitals reviewed.   ED Course  Procedures (including critical care time) Labs Review Labs Reviewed  CBC WITH DIFFERENTIAL/PLATELET  BASIC METABOLIC PANEL  MAGNESIUM  I-STAT TROPOININ, ED    Imaging Review No results found.   EKG Interpretation   Date/Time:  Saturday March 08 2015 15:11:41 EDT Ventricular Rate:  93 PR Interval:  71 QRS Duration: 97 QT Interval:  357 QTC  Calculation: 444 R Axis:   104 Text Interpretation:  Ectopic atrial rhythm. Likely atrial paced based on  prior EKG but paced spikes difficult to discern on current.  short PR  interval Borderline right axis deviation Baseline wander in lead(s) V5  Confirmed by KOHUT  MD, STEPHEN ([id number]) on 03/08/2015 3:48:32 PM     5:00 PM Patient with low Magnesium.  Will order IV Magnesium  7:00 PM Patient is now becoming very agitated and states that she no longer wants the Magnesium and would like to be discharged home.   MDM   Final diagnoses:  Chest pain   Patient presents today with chest pain.  VSS.  No hypoxia.  No ischemic changes on EKG.  Troponin is negative.  CXR is negative.  She reports that she has had similar pain in the past caused by low Magnesium.  Magnesium today is 1.0.  However, patient refused the IV Mg when the RN attempted to give it to her.  Review of the chart shows that the patient has left AMA for similar reasons in the past.  Although when she presented to the ED she requested to have her Mg checked and requested to have IV Magnesium  Patient appears similar at time of discharge.  She reports that she has a Development worker, international aid in [place].  Patient instructed to follow up with Cardiology.  Return precautions given. Marland Kitchen   Santiago Glad, PA-C 03/10/15 2124  Elwin Mocha, MD 03/11/15 [phone]

## 2015-03-08 NOTE — Discharge Instructions (Signed)

## 2015-03-08 NOTE — ED Notes (Signed)
Pt refuses to have Magnesium ran at rate ordered . She reports she normally has it ran in 1 to 4 hours.  She states " I will take this IV out, you are trying to give me a heart attack". Will notify PA Heather.

## 2015-03-08 NOTE — ED Notes (Signed)
Pt informed we are awaiting IV Team.

## 2015-03-08 NOTE — ED Notes (Signed)
Pt presents with c/o sharp right sided chest pain that started last night. Pt reports she has had this pain before. Pt also c/o some shortness of breath and dizziness.

## 2015-03-08 NOTE — ED Notes (Signed)
IV Team at bedside 

## 2015-03-08 NOTE — ED Notes (Signed)
Crackers and Ice provided per pt request.

## 2015-03-08 NOTE — ED Notes (Signed)
Awake. Verbally responsive. Resp even and unlabored. ABC's intact. Pt OOB in personal wc. Denies pain. Requested nurse remove IV at this time. Pt and family stated that they have been here awhile and it was a holiday and the pt did not get much rest last night and they were ready to go.

## 2015-03-08 NOTE — ED Notes (Signed)
Awake. Verbally responsive. A/O x4. Resp even and unlabored. No audible adventitious breath sounds noted. ABC's intact.  

## 2015-03-08 NOTE — ED Notes (Signed)
Unsuccessful attempt at IV insertion. Another RN to attempt

## 2015-03-08 NOTE — ED Notes (Signed)
Heather PA speaking with patient. Patient has decided to leave  Without receiving  Magnesium.

## 2015-03-08 NOTE — ED Notes (Signed)
PT transported to XRAY 

## 2015-04-01 NOTE — Op Note (Signed)
PATIENT NAME:  [name], [name] MR#:  [id number] DATE OF BIRTH:  [birth year]  DATE OF PROCEDURE:  08/03/2012  PREOPERATIVE DIAGNOSIS: Pelvic pain and menorrhagia.  POSTOPERATIVE DIAGNOSIS: Pelvic pain and menorrhagia with endometriosis.   PROCEDURES PERFORMED:  1. Laparoscopic supracervical hysterectomy.  2. Left salpingo-oophorectomy.  3. Right salpingectomy.   SURGEON: Annamarie MajorPaul Harris, [name]   ASSISTANJanene Harvey: [place]  ANESTHESIA: General.   ESTIMATED BLOOD LOSS: 25 mL.   COMPLICATIONS: None.   FINDINGS: Endometriosis visualized. 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 The patient had a cyst on both fallopian tubes as well as on her left ovary. The patient had normal appendix and liver and no adhesions found.   DISPOSITION: To recovery room in stable condition.   TECHNIQUE: The patient is prepped and draped in the usual sterile fashion after adequate anesthesia is obtained in the dorsal lithotomy position. After careful placement Foley catheter is inserted and sponge stick is placed vaginally for manipulation purposes.   Attention is then turned to the abdomen where a Veress needle is inserted through a 5 mm infraumbilical incision after Marcaine is used to anesthetize the skin. Veress needle placement is confirmed using the hanging drop technique and the abdomen is then insufflated with CO2 gas. A 5 mm trocar is then inserted under direct visualization with the laparoscope with no injuries or bleeding noted. The patient is placed in Trendelenburg positioning. Trocars are placed in the right and left lower quadrants lateral to the inferior epigastric blood vessels with a 5 mm on the left and an 11 mm on the right. Laparoscopic instrumentation is then performed to isolate the right fallopian tube and dissected away from the ovary with a 5 mm Harmonic scalpel without compromise of the right ovarian blood supply. Once the level of the cornua is reached, the round  ligaments are carefully coagulated and cut and then the broad ligament complex is dissected to free the uterus away from the pelvic sidewall. Once the level of the uterine arteries is reached, these are then  carefully coagulated and cut, again with the Harmonic scalpel. On the left side, the ovary and fallopian tube is stabilized and the infundibulopelvic blood vessels and ligament are carefully coagulated and cut using the 5 mm Harmonic scalpel. Dissection is carried to the level of the uterus. The round ligament is also dissected and the uterine arteries are reached without bleeding and are carefully coagulated and cut. At this point, the uterus is noted to be blanching. The uterus is then amputated at the level of the cervix with approximately 1 cm length of cervix left inside. The endocervical canal is then cauterized with use of the Harmonic scalpel. No electrocautery is used during the entirety of the case.   Pelvic cavity is irrigated with aspiration of all fluid and no bleeding is noted. There is no apparent injury to bowel, bladder, ureter, or other structures. Then the morcellator device is placed through the right lower quadrant incision after the trocar is removed and the uterus and adnexa is removed with morcellation process and sent to pathology for further review. The right ovary looks to be in normal position with good blood flow at the conclusion of the case. Repeat aspiration is performed and the patient is leveled and all fluid is aspirated. Interceed is placed over the operative site, at the level of the cervix. Gas is expelled and trocars are removed. The right lower quadrant fascia is closed with 0 Vicryl suture  and then the skin is closed with Dermabond at all three sites. Sponge stick is removed and Foley catheter is left in place. The patient goes to the recovery room in stable condition. All sponge, instrument, and needle counts are correct.  ____________________________ R. Annamarie Major, MD rph:slb D: 08/03/2012 10:56:25 ET T: 08/03/2012 11:30:00 ET JOB#: [id number]  cc: Dierdre Searles, MD, <Dictator> Nadara Mustard MD ELECTRONICALLY SIGNED 08/04/2012 9:41

## 2015-05-21 ENCOUNTER — Ambulatory Visit (INDEPENDENT_AMBULATORY_CARE_PROVIDER_SITE_OTHER): Payer: Medicaid Other | Admitting: General Surgery

## 2015-05-21 ENCOUNTER — Encounter: Payer: Self-pay | Admitting: General Surgery

## 2015-05-21 VITALS — BP 126/78 | HR 82 | Resp 14 | Ht 60.0 in | Wt 99.0 lb

## 2015-05-21 DIAGNOSIS — G8929 Other chronic pain: Secondary | ICD-10-CM

## 2015-05-21 DIAGNOSIS — R1031 Right lower quadrant pain: Secondary | ICD-10-CM

## 2015-05-21 NOTE — Progress Notes (Signed)
Patient ID: [name], female   DOB: 06-10-1981, 34 y.o.   MRN: [id number]  Chief Complaint  Patient presents with  . Other     pain right side    HPI [name] is a 34 y.o. female here today for evaluation of pain on right side. She describes the pain as stinging that radiates from abdomen to the right lower quadrant.She does feel pressure, and a "popping" sensatation going on for 2-3 months. Per patient and patient's mother [name] is planning on doing surgery on right ovary, this has not been scheduled yet. [facility] being followed for cyst on kidneys and ovaries. Evidence of an appendicolith was identified on that study. HPI  Past Medical History  Diagnosis Date  . Cerebral palsy   . Hypokalemia     now resolved  . 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  . Gitelman syndrome   . Chronic back pain   . Carpal tunnel syndrome   . Pacemaker   . SA node dysfunction 2008  . RSD (reflex sympathetic dystrophy)     right foot,     Past Surgical History  Procedure Laterality Date  . Pacemaker insertion    . Orthopedic surgery    . Portacath placement    . Abdominal hysterectomy Left 2013    [name]    Family History  Problem Relation Age of Onset  . Diabetes Mother   . Hypertension Mother     Social History History  Substance Use Topics  . Smoking status: Never Smoker   . Smokeless tobacco: Not on file  . Alcohol Use: No    Allergies  Allergen Reactions  . Cymbalta [Duloxetine Hcl]     Stressed out  . Gabapentin     Goes crazy  . Prozac [Fluoxetine Hcl] Nausea And Vomiting  . Sulfa Antibiotics Nausea And Vomiting    Current Outpatient Prescriptions  Medication Sig Dispense Refill  . magnesium sulfate in dextrose 5 % 250 mL Inject into the vein once a week.    . methylphenidate (RITALIN) 20 MG tablet Take 20 mg by mouth 3 (three) times daily as needed (Patient  Preference).     . pregabalin (LYRICA) 50 MG capsule Take 50 mg by mouth 3 (three) times daily.      No current facility-administered medications for this visit.    Review of Systems Review of Systems  Constitutional: Negative.   Respiratory: Negative.   Cardiovascular: Negative.     Blood pressure 126/78, pulse 82, resp. rate 14, height 5' (1.524 m), weight 99 lb (44.906 kg), last menstrual period 06/09/2012.  Physical Exam Physical Exam  Constitutional: She is oriented to person, place, and time. She appears well-developed.  Cardiovascular: Normal rate, regular rhythm and normal heart sounds.   Pulmonary/Chest: Effort normal and breath sounds normal.  Abdominal: There is tenderness in the right lower quadrant.  Neurological: She is alert and oriented to person, place, and time.  Skin: Skin is warm and dry.    Data Reviewed [facility] and mild changes suggestive of possible early appendicitis.  GYN notes from Annamarie MajorPaul Harris, [name] dated 05/02/2015 were reviewed.  I spoke with [name] by phone during the patient's visit.  Assessment    Chronic right lower quadrant pain with previously identified right ovarian cyst and CT suggesting appendicolith in April.  Plan    Considering there are ongoing plans for general anesthesia for cyst removal, I think it reasonable to remove the appendix at the same time. This will be coordinated with Dr. Tiburcio Pea.    Plan to do appendectomy on same day as planned right ovary removal.     PCP: NP Laurette Schimke Ref: Dr Velora Mediate  Earline Mayotte 05/23/2015, 2:32 PM

## 2015-05-21 NOTE — Patient Instructions (Signed)
Laparoscopic Appendectomy Appendectomy is surgery to remove the appendix. Laparoscopic surgery uses several small cuts (incisions) instead of one large incision. Laparoscopic surgery offers a shorter recovery time and less discomfort. LET YOUR CAREGIVER KNOW ABOUT:  Allergies to food or medicine.  Medicines taken, including vitamins, dietary supplements, herbs, eyedrops, over-the-counter medicines, and creams.  Use of steroids (by mouth or creams).  Previous problems with anesthetics or numbing medicines.  History of bleeding problems or blood clots.  Previous surgery.  Other health problems, including diabetes, heart problems, lung problems, and kidney problems.  Possibility of pregnancy, if this applies. RISKS AND COMPLICATIONS  Infection. A germ starts growing in the wound. This can usually be treated with antibiotics. In some cases, the wound will need to be opened and cleaned.  Bleeding.  Damage to other organs.  Sores (abscesses).  Chronic pain at the incision sites. This is defined as pain that lasts for more than 3 months.  Blood clots in the legs that may rarely travel to the lungs.  Infection in the lungs (pneumonia). BEFORE THE PROCEDURE Appendectomy is usually performed immediately after an inflamed appendix (appendicitis) is diagnosed. No preparation is necessary ahead of this procedure. PROCEDURE  You will be given medicine that makes you sleep (general anesthetic). After you are asleep, a flexible tube (catheter) may be inserted into your bladder to drain your urine during surgery. The tube is removed before you wake up after surgery. When you are asleep, carbondioxide gas will be used to inflate your abdomen. This will allow your surgeon to see inside your abdomen and perform your surgery. Three small incisions will be made in your abdomen. Your surgeon will insert a thin, lighted tube (laparoscope) through one of the incisions. Your surgeon will look through the  laparoscope while performing the surgery. Other tools will be inserted through the other incisions. Laparoscopic procedures may not be appropriate when:  There is major scarring from a previous surgery.  The patient has bleeding disorders.  A pregnancy is near term.  There are other conditions which make the laparoscopic procedure impossible, such as an advanced infection or a ruptured appendix. If your surgeon feels it is not safe to continue with the laparoscopic procedure, he or she will perform an open surgery instead. This gives the surgeon a larger view and more space to work. Open surgery requires a longer recovery time. After your appendix is removed, your incisions will be closed with stitches (sutures) or skin adhesive. AFTER THE PROCEDURE You will be taken to a recovery room. When the anesthesia has worn off, you will be returned to your hospital room. You will be given pain medicines to keep you comfortable. Ask your caregiver how long your hospital stay will be. Document Released: 07/13/2004 Document Revised: 02/21/2012 Document Reviewed: 06/08/2011 [facility] Patient Information 2015 [place]. This information is not intended to replace advice given to you by your health care provider. Make sure you discuss any questions you have with your health care provider.

## 2015-05-23 DIAGNOSIS — R1031 Right lower quadrant pain: Principal | ICD-10-CM

## 2015-05-23 DIAGNOSIS — G8929 Other chronic pain: Secondary | ICD-10-CM | POA: Insufficient documentation

## 2015-05-27 ENCOUNTER — Other Ambulatory Visit: Payer: Self-pay | Admitting: General Surgery

## 2015-05-27 ENCOUNTER — Encounter: Payer: Self-pay | Admitting: Anesthesiology

## 2015-05-27 ENCOUNTER — Encounter
Admission: RE | Admit: 2015-05-27 | Discharge: 2015-05-27 | Disposition: A | Discharge status: Home / Self Care | Payer: Medicaid Other | Source: Ambulatory Visit | Attending: Obstetrics & Gynecology | Admitting: Obstetrics & Gynecology

## 2015-05-27 DIAGNOSIS — Z01818 Encounter for other preprocedural examination: Secondary | ICD-10-CM

## 2015-05-27 DIAGNOSIS — R1031 Right lower quadrant pain: Secondary | ICD-10-CM

## 2015-05-27 DIAGNOSIS — Z01810 Encounter for preprocedural cardiovascular examination: Secondary | ICD-10-CM | POA: Insufficient documentation

## 2015-05-27 DIAGNOSIS — Z01812 Encounter for preprocedural laboratory examination: Secondary | ICD-10-CM | POA: Diagnosis present

## 2015-05-27 DIAGNOSIS — R000 Tachycardia, unspecified: Secondary | ICD-10-CM | POA: Diagnosis not present

## 2015-05-27 HISTORY — DX: Magnesium deficiency: E61.2

## 2015-05-27 LAB — TYPE AND SCREEN
ABO/RH(D): O POS
Antibody Screen: NEGATIVE

## 2015-05-27 LAB — DIFFERENTIAL
BASOS PCT: 1 %
Basophils Absolute: 0 10*3/uL (ref 0–0.1)
EOS ABS: 0 10*3/uL (ref 0–0.7)
Eosinophils Relative: 0 %
Lymphocytes Relative: 24 %
Lymphs Abs: 1.7 10*3/uL (ref 1.0–3.6)
MONO ABS: 0.4 10*3/uL (ref 0.2–0.9)
MONOS PCT: 6 %
Neutro Abs: 4.9 10*3/uL (ref 1.4–6.5)
Neutrophils Relative %: 69 %

## 2015-05-27 LAB — SURGICAL PCR SCREEN
MRSA, PCR: NEGATIVE
STAPHYLOCOCCUS AUREUS: NEGATIVE

## 2015-05-27 LAB — CBC
HEMATOCRIT: 37.2 % (ref 35.0–47.0)
HEMOGLOBIN: 13 g/dL (ref 12.0–16.0)
MCH: 30.1 pg (ref 26.0–34.0)
MCHC: 34.9 g/dL (ref 32.0–36.0)
MCV: 86.3 fL (ref 80.0–100.0)
Platelets: 127 10*3/uL — ABNORMAL LOW (ref 150–440)
RBC: 4.32 MIL/uL (ref 3.80–5.20)
RDW: 13.1 % (ref 11.5–14.5)
WBC: 7.1 10*3/uL (ref 3.6–11.0)

## 2015-05-27 LAB — BASIC METABOLIC PANEL
ANION GAP: 6 (ref 5–15)
BUN: 15 mg/dL (ref 6–20)
CHLORIDE: 107 mmol/L (ref 101–111)
CO2: 27 mmol/L (ref 22–32)
Calcium: 8.8 mg/dL — ABNORMAL LOW (ref 8.9–10.3)
Creatinine, Ser: 0.77 mg/dL (ref 0.44–1.00)
Glucose, Bld: 102 mg/dL — ABNORMAL HIGH (ref 65–99)
POTASSIUM: 3.5 mmol/L (ref 3.5–5.1)
Sodium: 140 mmol/L (ref 135–145)

## 2015-05-27 LAB — ABO/RH: ABO/RH(D): O POS

## 2015-05-27 LAB — MAGNESIUM: Magnesium: 1.1 mg/dL — ABNORMAL LOW (ref 1.7–2.4)

## 2015-05-27 NOTE — Patient Instructions (Signed)
  Your procedure is scheduled on: June 03, 2015 (Tuesday)  Report to Day Surgery. To find out your arrival time please call [phone] between 1PM - 3PM on June 02, 2015 (Monday) Remember: Instructions that are not followed completely may result in serious medical risk, up to and including death, or upon the discretion of your surgeon and anesthesiologist your surgery may need to be rescheduled.    __x__ 1. Do not eat food or drink liquids after midnight. No gum chewing or hard candies.     ____ 2. No Alcohol for 24 hours before or after surgery.   ___ 3. Bring all medications with you on the day of surgery if instructed.    __x__ 4. Notify your doctor if there is any change in your medical condition     (cold, fever, infections).     Do not wear jewelry, make-up, hairpins, clips or nail polish.  Do not wear lotions, powders, or perfumes. You may wear deodorant.  Do not shave 48 hours prior to surgery. Men may shave face and neck.  Do not bring valuables to the hospital.    [facility] is not responsible for any belongings or valuables.               Contacts, dentures or bridgework may not be worn into surgery.  Leave your suitcase in the car. After surgery it may be brought to your room.  For patients admitted to the hospital, discharge time is determined by your                treatment team.   Patients discharged the day of surgery will not be allowed to drive home.   Please read over the following fact sheets that you were given:   MRSA Information and Surgical Site Infection Prevention   ____ Take these medicines the morning of surgery with A SIP OF WATER:    1.   2.   3.   4.  5.  6.  ____ Fleet Enema (as directed)   __x__ Use CHG Soap as directed (SAGE WIPES)  ____ Use inhalers on the day of surgery  ____ Stop metformin 2 days prior to surgery    ____ Take 1/2 of usual insulin dose the night before surgery and none on the morning of surgery.   ____ Stop  Coumadin/Plavix/aspirin on -  ____ Stop Anti-inflammatories on    ____ Stop supplements until after surgery.    ____ Bring C-Pap to the hospital.

## 2015-05-27 NOTE — H&P (Signed)
Patient ID: [name], female DOB: Dec 21, 1980, 34 y.o. MRN: [id number]  Chief Complaint  Patient presents with  . Other    pain right side    HPI [name] is a 34 y.o. female here today for evaluation of pain on right side. She describes the pain as stinging that radiates from abdomen to the right lower quadrant.She does feel pressure, and a "popping" sensatation going on for 2-3 months. Per patient and patient's mother Dr. Tiburcio Pea is planning on doing surgery on right ovary, this has not been scheduled yet. Patient had a CT scan done on 03/29/15 at [facility], being followed for cyst on kidneys and ovaries. Evidence of an appendicolith was identified on that study. HPI  Past Medical History  Diagnosis Date  . Cerebral palsy   . Hypokalemia     now resolved  . 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  . Gitelman syndrome   . Chronic back pain   . Carpal tunnel syndrome   . Pacemaker   . SA node dysfunction 2008  . RSD (reflex sympathetic dystrophy)     right foot,     Past Surgical History  Procedure Laterality Date  . Pacemaker insertion    . Orthopedic surgery    . Portacath placement    . Abdominal hysterectomy Left 2013    Dr. Tiburcio Pea    Family History  Problem Relation Age of Onset  . Diabetes Mother   . Hypertension Mother     Social History History  Substance Use Topics  . Smoking status: Never Smoker   . Smokeless tobacco: Not on file  . Alcohol Use: No    Allergies  Allergen Reactions  . Cymbalta [Duloxetine Hcl]     Stressed out  . Gabapentin     Goes crazy  . Prozac [Fluoxetine Hcl] Nausea And Vomiting  . Sulfa Antibiotics Nausea And Vomiting    Current Outpatient Prescriptions  Medication Sig Dispense Refill  . magnesium sulfate in dextrose 5 % 250 mL Inject into  the vein once a week.    . methylphenidate (RITALIN) 20 MG tablet Take 20 mg by mouth 3 (three) times daily as needed (Patient Preference).     . pregabalin (LYRICA) 50 MG capsule Take 50 mg by mouth 3 (three) times daily.      No current facility-administered medications for this visit.    Review of Systems Review of Systems  Constitutional: Negative.  Respiratory: Negative.  Cardiovascular: Negative.    Blood pressure 126/78, pulse 82, resp. rate 14, height 5' (1.524 m), weight 99 lb (44.906 kg), last menstrual period 06/09/2012.  Physical Exam Physical Exam  Constitutional: She is oriented to person, place, and time. She appears well-developed.  Cardiovascular: Normal rate, regular rhythm and normal heart sounds.  Pulmonary/Chest: Effort normal and breath sounds normal.  Abdominal: There is tenderness in the right lower quadrant.  Neurological: She is alert and oriented to person, place, and time.  Skin: Skin is warm and dry.    Data Reviewed Review of the [facility] described an appendicolith and mild changes suggestive of possible early appendicitis.  GYN notes from Annamarie Major, [name] dated 05/02/2015 were reviewed.  I spoke with Dr. Tiburcio Pea by phone during the patient's visit.  Assessment    Chronic right lower quadrant pain with previously identified right ovarian cyst and CT suggesting appendicolith in April.    Plan    Considering there are ongoing plans  for general anesthesia for cyst removal, I think it reasonable to remove the appendix at the same time. This will be coordinated with Dr. Tiburcio Pea.    Plan to do appendectomy on same day as planned right ovary removal. Surgery tentatively scheduled for 06/03/2015.    PCP: NP Laurette Schimke Ref: Dr Velora Mediate  Earline Mayotte

## 2015-05-27 NOTE — Pre-Procedure Instructions (Signed)
Dr. Maisie Fus notified regarding patient medical (CP, magnesium deficiency, RDS,and Gitelman syndrome),and cardiac  (POT,S syndrome,pacemaker, and SA node dysfunction) history.Cardiac clearance requested from Dr. Gifford Shave, and medical clearance requested from [facility], FNP @ [facility]

## 2015-05-29 NOTE — Pre-Procedure Instructions (Signed)
OR notified verbally and placed in special needs of pacemaker implanted in abdomen right side. [name] stated Dr. Lemar Livings is aware .

## 2015-05-31 DIAGNOSIS — N83201 Unspecified ovarian cyst, right side: Secondary | ICD-10-CM | POA: Diagnosis present

## 2015-06-03 ENCOUNTER — Ambulatory Visit
Admission: RE | Admit: 2015-06-03 | Payer: Medicaid Other | Source: Ambulatory Visit | Admitting: Obstetrics & Gynecology

## 2015-06-03 ENCOUNTER — Encounter: Admission: RE | Payer: Self-pay | Source: Ambulatory Visit

## 2015-06-03 SURGERY — LAPAROSCOPY OPERATIVE
Anesthesia: Choice

## 2015-06-03 MED ORDER — CEFOXITIN SODIUM-DEXTROSE 2-2.2 GM-% IV SOLR (PREMIX)
INTRAVENOUS | Status: AC
  Filled 2015-06-03: qty 50

## 2015-07-02 ENCOUNTER — Inpatient Hospital Stay: Admission: RE | Admit: 2015-07-02 | Payer: Medicaid Other | Source: Ambulatory Visit

## 2015-07-03 ENCOUNTER — Inpatient Hospital Stay: Admission: RE | Admit: 2015-07-03 | Payer: Medicaid Other | Source: Ambulatory Visit

## 2015-07-03 ENCOUNTER — Telehealth: Payer: Self-pay | Admitting: *Deleted

## 2015-07-03 NOTE — Telephone Encounter (Signed)
Per Harriett Sine at [facility], patient was a no show for pre-op appointment with Dr. Tiburcio Pea. They have rescheduled patient for pre-op on 07-14-15. Patient will be a phone interview on 07-14-15. If patient does not show for appointment, surgery will need to be cancelled for 07-15-15.  Harriett Sine to notify our office if patient does not show for pre-op appointment with Dr. Tiburcio Pea.

## 2015-07-14 ENCOUNTER — Telehealth: Payer: Self-pay | Admitting: *Deleted

## 2015-07-14 ENCOUNTER — Inpatient Hospital Stay: Admission: RE | Admit: 2015-07-14 | Payer: Medicaid Other | Source: Ambulatory Visit

## 2015-07-14 NOTE — Telephone Encounter (Signed)
Per Harriett Sine at [facility], patient's surgery that was scheduled for tomorrow has been cancelled due to patient's mother's illness.   No reschedule at this time. Will wait till mom is doing better and able to care for her daughter post operatively.  Leah in the O.R. has been notified of cancellation.

## 2015-07-15 ENCOUNTER — Encounter: Admission: RE | Payer: Self-pay | Source: Ambulatory Visit

## 2015-07-15 ENCOUNTER — Ambulatory Visit
Admission: RE | Admit: 2015-07-15 | Payer: Medicaid Other | Source: Ambulatory Visit | Admitting: Obstetrics & Gynecology

## 2015-07-15 SURGERY — LAPAROSCOPY OPERATIVE
Anesthesia: General

## 2015-08-16 ENCOUNTER — Encounter: Payer: Self-pay | Admitting: Gynecology

## 2015-08-16 ENCOUNTER — Ambulatory Visit
Admission: EM | Admit: 2015-08-16 | Discharge: 2015-08-16 | Disposition: A | Discharge status: Home / Self Care | Payer: Medicaid Other | Attending: Internal Medicine | Admitting: Internal Medicine

## 2015-08-16 DIAGNOSIS — L259 Unspecified contact dermatitis, unspecified cause: Secondary | ICD-10-CM | POA: Diagnosis not present

## 2015-08-16 MED ORDER — HYDROCORTISONE VALERATE 0.2 % EX OINT
1.0000 | TOPICAL_OINTMENT | Freq: Two times a day (BID) | CUTANEOUS | Status: DC
Start: 2015-08-16 — End: 2017-11-23

## 2015-08-16 MED ORDER — MUPIROCIN 2 % EX OINT: 1.0000 "application " | TOPICAL_OINTMENT | Freq: Two times a day (BID) | CUTANEOUS | Status: DC

## 2015-08-16 NOTE — Discharge Instructions (Signed)
Prescriptions were given today for bactroban (mupirocin, an antibiotic ointment) and westcort (hydrocortisone valerate, a steroid ointment) for rash on legs. Anticipate gradual improvement in itching, red patches, over the next week or two.  Contact Dermatitis Contact dermatitis is a reaction to certain substances that touch the skin. Contact dermatitis can be either irritant contact dermatitis or allergic contact dermatitis. Irritant contact dermatitis does not require previous exposure to the substance for a reaction to occur.Allergic contact dermatitis only occurs if you have been exposed to the substance before. Upon a repeat exposure, your body reacts to the substance.  CAUSES  Many substances can cause contact dermatitis. Irritant dermatitis is most commonly caused by repeated exposure to mildly irritating substances, such as:  Makeup.  Soaps.  Detergents.  Bleaches.  Acids.  Metal salts, such as nickel. Allergic contact dermatitis is most commonly caused by exposure to:  Poisonous plants.  Chemicals (deodorants, shampoos).  Jewelry.  Latex.  Neomycin in triple antibiotic cream.  Preservatives in products, including clothing. SYMPTOMS  The area of skin that is exposed may develop:  Dryness or flaking.  Redness.  Cracks.  Itching.  Pain or a burning sensation.  Blisters. With allergic contact dermatitis, there may also be swelling in areas such as the eyelids, mouth, or genitals.  DIAGNOSIS  Your caregiver can usually tell what the problem is by doing a physical exam. In cases where the cause is uncertain and an allergic contact dermatitis is suspected, a patch skin test may be performed to help determine the cause of your dermatitis. TREATMENT Treatment includes protecting the skin from further contact with the irritating substance by avoiding that substance if possible. Barrier creams, powders, and gloves may be helpful. Your caregiver may also  recommend:  Steroid creams or ointments applied 2 times daily. For best results, soak the rash area in cool water for 20 minutes. Then apply the medicine. Cover the area with a plastic wrap. You can store the steroid cream in the refrigerator for a "chilly" effect on your rash. That may decrease itching. Oral steroid medicines may be needed in more severe cases.  Antibiotics or antibacterial ointments if a skin infection is present.  Antihistamine lotion or an antihistamine taken by mouth to ease itching.  Lubricants to keep moisture in your skin.  Burow's solution to reduce redness and soreness or to dry a weeping rash. Mix one packet or tablet of solution in 2 cups cool water. Dip a clean washcloth in the mixture, wring it out a bit, and put it on the affected area. Leave the cloth in place for 30 minutes. Do this as often as possible throughout the day.  Taking several cornstarch or baking soda baths daily if the area is too large to cover with a washcloth. Harsh chemicals, such as alkalis or acids, can cause skin damage that is like a burn. You should flush your skin for 15 to 20 minutes with cold water after such an exposure. You should also seek immediate medical care after exposure. Bandages (dressings), antibiotics, and pain medicine may be needed for severely irritated skin.  HOME CARE INSTRUCTIONS  Avoid the substance that caused your reaction.  Keep the area of skin that is affected away from hot water, soap, sunlight, chemicals, acidic substances, or anything else that would irritate your skin.  Do not scratch the rash. Scratching may cause the rash to become infected.  You may take cool baths to help stop the itching.  Only take over-the-counter or prescription medicines  as directed by your caregiver.  See your caregiver for follow-up care as directed to make sure your skin is healing properly. SEEK MEDICAL CARE IF:   Your condition is not better after 3 days of  treatment.  You seem to be getting worse.  You see signs of infection such as swelling, tenderness, redness, soreness, or warmth in the affected area.  You have any problems related to your medicines. Document Released: 11/26/2000 Document Revised: 02/21/2012 Document Reviewed: 05/04/2011 [facility] Patient Information 2015 [place], [place]. This information is not intended to replace advice given to you by your health care provider. Make sure you discuss any questions you have with your health care provider.

## 2015-08-16 NOTE — ED Provider Notes (Signed)
CSN: [id number]     Arrival date & time 08/16/15  1524 History   First MD Initiated Contact with Patient 08/16/15 1641     Chief Complaint  Patient presents with  . Rash   HPI  Patient is a 34 year old lady with cerebral palsy, presents today with 4 day history of red itchy patches on her legs, one of which is a little bit sore. Her mother relates that she supposed to be using her walker, but sometimes finds this cumbersome, and just crawls.   She has scattered flat red patches over her distal legs.  No fever, feels fine otherwise.  Past Medical History  Diagnosis Date  . Cerebral palsy   . Hypokalemia     now resolved  . 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  . Gitelman syndrome   . Chronic back pain   . Carpal tunnel syndrome   . Pacemaker   . SA node dysfunction 2008  . RSD (reflex sympathetic dystrophy)     right foot,   . RSD (reflex sympathetic dystrophy)   . Magnesium deficiency    Past Surgical History  Procedure Laterality Date  . Pacemaker insertion    . Orthopedic surgery    . Portacath placement    . Abdominal hysterectomy Left 2013    Dr. Tiburcio Pea  . Insert / replace / remove pacemaker     Family History  Problem Relation Age of Onset  . Diabetes Mother   . Hypertension Mother    Social History  Substance Use Topics  . Smoking status: Never Smoker   . Smokeless tobacco: Never Used  . Alcohol Use: No    Review of Systems  All other systems reviewed and are negative.   Allergies  Cymbalta; Gabapentin; Prozac; and Sulfa antibiotics  Home Medications   Prior to Admission medications   Medication Sig Start Date End Date Taking? Authorizing Provider  magnesium sulfate in dextrose 5 % 250 mL Inject into the vein once a week.   Yes Historical Provider, MD  methylphenidate (RITALIN) 20 MG tablet Take 20 mg by mouth 3 (three) times daily.    Yes Historical Provider, MD  pregabalin (LYRICA) 50 MG capsule Take 75 mg by mouth  3 (three) times daily.    Yes Historical Provider, MD  hydrocortisone valerate ointment (WESTCORT) 0.2 % Apply 1 application topically 2 (two) times daily. 08/16/15   Eustace Moore, MD  mupirocin ointment (BACTROBAN) 2 % Apply 1 application topically 2 (two) times daily. To sore patches of leg rash 08/16/15   Eustace Moore, MD   Meds Ordered and Administered this Visit  Medications - No data to display  BP 109/65 mmHg  Pulse 85  Temp(Src) 98.2 F (36.8 C) (Oral)  Resp 16  Ht  (1.575 m)  Wt 105 lb (47.628 kg)  BMI 19.20 kg/m2  SpO2 100%  LMP 06/09/2012 No data found.   Physical Exam  Constitutional: She is oriented to person, place, and time. No distress.  Alert, nicely groomed  HENT:  Head: Atraumatic.  Eyes:  Conjugate gaze, no eye redness/drainage  Neck: Neck supple.  Cardiovascular: Normal rate.   Pulmonary/Chest: No respiratory distress.  Abdominal: She exhibits no distension.  Musculoskeletal: She exhibits no edema.  No leg swelling  Neurological: She is alert and oriented to person, place, and time.  Skin: Skin is warm and dry.  Pink. No cyanosis Scattered over the distal lower extremities are several flat pale  red patches 2-3.5 cm across, no scale, occasional crust. One patch, just medial to the right knee, is slightly sore, but no vesicles or erosions are seen. No pustules, no drainage.  Nursing note and vitals reviewed.   ED Course  Procedures   MDM   1. Contact dermatitis and eczema    Discharge Medication List as of 08/16/2015  4:57 PM    START taking these medications   Details  hydrocortisone valerate ointment (WESTCORT) 0.2 % Apply 1 application topically 2 (two) times daily., Starting 08/16/2015, Until Discontinued, Print    mupirocin ointment (BACTROBAN) 2 % Apply 1 application topically 2 (two) times daily. To sore patches of leg rash, Starting 08/16/2015, Until Discontinued, Print       Prescriptions as above, to cover perhaps slightly  impetiginized eczematous changes.    Eustace Moore, MD 08/16/15 [phone]

## 2015-08-16 NOTE — ED Notes (Signed)
Patient c/o rash on bilateral legs x 4 days. Pt. Stated itching and redness.

## 2015-08-19 ENCOUNTER — Emergency Department (HOSPITAL_COMMUNITY)
Admission: EM | Admit: 2015-08-19 | Discharge: 2015-08-19 | Discharge status: Against Medical Advice | Payer: Medicaid Other | Attending: Emergency Medicine | Admitting: Emergency Medicine

## 2015-08-19 ENCOUNTER — Encounter (HOSPITAL_COMMUNITY): Payer: Self-pay | Admitting: Emergency Medicine

## 2015-08-19 DIAGNOSIS — E612 Magnesium deficiency: Secondary | ICD-10-CM | POA: Diagnosis not present

## 2015-08-19 DIAGNOSIS — Z8679 Personal history of other diseases of the circulatory system: Secondary | ICD-10-CM | POA: Insufficient documentation

## 2015-08-19 DIAGNOSIS — Z7952 Long term (current) use of systemic steroids: Secondary | ICD-10-CM | POA: Insufficient documentation

## 2015-08-19 DIAGNOSIS — G8929 Other chronic pain: Secondary | ICD-10-CM | POA: Insufficient documentation

## 2015-08-19 DIAGNOSIS — Z792 Long term (current) use of antibiotics: Secondary | ICD-10-CM | POA: Diagnosis not present

## 2015-08-19 DIAGNOSIS — Z79899 Other long term (current) drug therapy: Secondary | ICD-10-CM | POA: Insufficient documentation

## 2015-08-19 DIAGNOSIS — Z760 Encounter for issue of repeat prescription: Secondary | ICD-10-CM | POA: Diagnosis present

## 2015-08-19 DIAGNOSIS — Z950 Presence of cardiac pacemaker: Secondary | ICD-10-CM | POA: Insufficient documentation

## 2015-08-19 NOTE — ED Notes (Signed)
Pt needs medication prescription for Ritalin. Pt hasnt received it in the mail.  Pt tried to get one pill from the pharmacy and wouldn't give patient one.  Pt tried to see a PA that she saw recently in [place] but they werent in.

## 2015-08-19 NOTE — ED Provider Notes (Signed)
CSN: [id number]     Arrival date & time 08/19/15  1510 History  This chart was scribed for non-physician practitioner, Arthor Captain, PA-C, working with Benjiman Core, MD by Freida Busman, ED Scribe. This patient was seen in room WTR8/WTR8 and the patient's care was started at 4:54 PM.     Chief Complaint  Patient presents with  . Medication Refill    The history is provided by the patient. No language interpreter was used.   HPI Comments:  [name] is a 34 y.o. female who presents to the Emergency Department for Ritalin refill. Pt states she has not received her prescription in them mail. Per mother pt is prescribed Ritalin for POTS.   Pt and mother left before completion of interview. i was unable to finish asking   Past Medical History  Diagnosis Date  . Cerebral palsy   . Hypokalemia     now resolved  . 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  . Gitelman syndrome   . Chronic back pain   . Carpal tunnel syndrome   . Pacemaker   . SA node dysfunction 2008  . RSD (reflex sympathetic dystrophy)     right foot,   . RSD (reflex sympathetic dystrophy)   . Magnesium deficiency    Past Surgical History  Procedure Laterality Date  . Pacemaker insertion    . Orthopedic surgery    . Portacath placement    . Abdominal hysterectomy Left 2013    Dr. Tiburcio Pea  . Insert / replace / remove pacemaker     Family History  Problem Relation Age of Onset  . Diabetes Mother   . Hypertension Mother    Social History  Substance Use Topics  . Smoking status: Never Smoker   . Smokeless tobacco: Never Used  . Alcohol Use: No   OB History    No data available     Review of Systems  Unable to perform ROS: Other    Allergies  Cymbalta; Gabapentin; Prozac; and Sulfa antibiotics  Home Medications   Prior to Admission medications   Medication Sig Start Date End Date Taking? Authorizing Provider  hydrocortisone valerate ointment (WESTCORT) 0.2  % Apply 1 application topically 2 (two) times daily. 08/16/15   Eustace Moore, MD  magnesium sulfate in dextrose 5 % 250 mL Inject into the vein once a week.    Historical Provider, MD  methylphenidate (RITALIN) 20 MG tablet Take 20 mg by mouth 3 (three) times daily.     Historical Provider, MD  mupirocin ointment (BACTROBAN) 2 % Apply 1 application topically 2 (two) times daily. To sore patches of leg rash 08/16/15   Eustace Moore, MD  pregabalin (LYRICA) 50 MG capsule Take 75 mg by mouth 3 (three) times daily.     Historical Provider, MD   BP 125/78 mmHg  Pulse 85  Temp(Src) 97.7 F (36.5 C) (Oral)  Resp 19  SpO2 100%  LMP 06/09/2012 Physical Exam  Constitutional: She appears well-nourished.  Cerebral palsy  Eyes: Conjunctivae are normal.  Neck: No JVD present.  Pulmonary/Chest: Effort normal.  Neurological: She is alert.  spacticity  Nursing note and vitals reviewed.  ED Course  Procedures   DIAGNOSTIC STUDIES:  Oxygen Saturation is 100% on RA, normal by my interpretation.    COORDINATION OF CARE:  4:59 PM Pt and mother eloped.  Labs Review Labs Reviewed - No data to display  Imaging Review No results found. I have  personally reviewed and evaluated these images and lab results as part of my medical decision-making.   EKG Interpretation None      MDM   Final diagnoses:  Medication refill    Patient requesting refill of her ritalin which she has been out of for the past week. I discussed that we do not refill controlled substances. Patient's mother became very angry and refused any further work up, history or evaluation.  I personally performed the services described in this documentation, which was scribed in my presence. The recorded information has been reviewed and is accurate.      Arthor Captain, PA-C 08/26/15 1623  Benjiman Core, MD 08/27/15 [id number]

## 2015-08-28 ENCOUNTER — Emergency Department
Admission: EM | Admit: 2015-08-28 | Discharge: 2015-08-28 | Disposition: A | Discharge status: Home / Self Care | Payer: Medicaid Other | Attending: Emergency Medicine | Admitting: Emergency Medicine

## 2015-08-28 ENCOUNTER — Encounter: Payer: Self-pay | Admitting: Emergency Medicine

## 2015-08-28 DIAGNOSIS — Z791 Long term (current) use of non-steroidal anti-inflammatories (NSAID): Secondary | ICD-10-CM | POA: Diagnosis not present

## 2015-08-28 DIAGNOSIS — G8929 Other chronic pain: Secondary | ICD-10-CM | POA: Insufficient documentation

## 2015-08-28 DIAGNOSIS — Z79899 Other long term (current) drug therapy: Secondary | ICD-10-CM | POA: Insufficient documentation

## 2015-08-28 DIAGNOSIS — R1031 Right lower quadrant pain: Secondary | ICD-10-CM | POA: Insufficient documentation

## 2015-08-28 LAB — URINALYSIS COMPLETE WITH MICROSCOPIC (ARMC ONLY)
BILIRUBIN URINE: NEGATIVE
Bacteria, UA: NONE SEEN
Glucose, UA: NEGATIVE mg/dL
Hgb urine dipstick: NEGATIVE
KETONES UR: NEGATIVE mg/dL
Leukocytes, UA: NEGATIVE
NITRITE: NEGATIVE
PH: 5 (ref 5.0–8.0)
PROTEIN: NEGATIVE mg/dL
SPECIFIC GRAVITY, URINE: 1.018 (ref 1.005–1.030)

## 2015-08-28 MED ORDER — NAPROXEN 500 MG PO TABS: 500.0000 mg | ORAL_TABLET | Freq: Two times a day (BID) | ORAL | Status: DC

## 2015-08-28 NOTE — ED Provider Notes (Signed)
[facility] Emergency Department Provider Note  ____________________________________________  Time seen: 3:00 PM  I have reviewed the triage vital signs and the nursing notes.   HISTORY  Chief Complaint Abdominal Pain    HPI [name] is a 34 y.o. female well-known to me and this emergency Department who presents with acute on chronic right lower quadrant abdominal pain. She states that she was scheduled to have an ovarian cystectomy performed by Dr. Tiburcio Pea but that she arrived late to the evaluation appointment today, so they told her that she would have to be rescheduled. Patient's mother frequently interrupts and speaks of her own illnesses as well. She brought her daughter to the ER concerned that the cyst might rupture and make her gravely ill. No fevers chills nausea vomiting. Pain is intermittent.     Past Medical History  Diagnosis Date  . Cerebral palsy   . Hypokalemia     now resolved  . 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  . Gitelman syndrome   . Chronic back pain   . Carpal tunnel syndrome   . Pacemaker   . SA node dysfunction 2008  . RSD (reflex sympathetic dystrophy)     right foot,   . RSD (reflex sympathetic dystrophy)   . Magnesium deficiency      Patient Active Problem List   Diagnosis Date Noted  . Right ovarian cyst 05/31/2015  . Abdominal pain, chronic, right lower quadrant 05/23/2015  . Hypomagnesemia 03/02/2014  . Back pain 03/02/2014     Past Surgical History  Procedure Laterality Date  . Pacemaker insertion    . Orthopedic surgery    . Portacath placement    . Abdominal hysterectomy Left 2013    Dr. Tiburcio Pea  . Insert / replace / remove pacemaker       Current Outpatient Rx  Name  Route  Sig  Dispense  Refill  . hydrocortisone valerate ointment (WESTCORT) 0.2 %   Topical   Apply 1 application topically 2 (two) times daily.   45 g   0   . magnesium sulfate in  dextrose 5 % 250 mL   Intravenous   Inject into the vein once a week.         . methylphenidate (RITALIN) 20 MG tablet   Oral   Take 20 mg by mouth 3 (three) times daily.          . mupirocin ointment (BACTROBAN) 2 %   Topical   Apply 1 application topically 2 (two) times daily. To sore patches of leg rash   30 g   0   . naproxen (NAPROSYN) 500 MG tablet   Oral   Take 1 tablet (500 mg total) by mouth 2 (two) times daily with a meal.   20 tablet   0   . pregabalin (LYRICA) 50 MG capsule   Oral   Take 75 mg by mouth 3 (three) times daily.             Allergies Cymbalta; Gabapentin; Prozac; and Sulfa antibiotics   Family History  Problem Relation Age of Onset  . Diabetes Mother   . Hypertension Mother     Social History Social History  Substance Use Topics  . Smoking status: Never Smoker   . Smokeless tobacco: Never Used  . Alcohol Use: No    Review of Systems  Constitutional:   No fever or chills. No weight changes Eyes:   No blurry  vision or double vision.  ENT:   No sore throat. Cardiovascular:   No chest pain. Respiratory:   No dyspnea or cough. Gastrointestinal:   Chronic right lower quadrant abdominal pain.  No BRBPR or melena. Genitourinary:   Negative for dysuria, urinary retention, bloody urine, or difficulty urinating. Musculoskeletal:   Negative for back pain. No joint swelling or pain. Skin:   Negative for rash. Neurological:   Negative for headaches, focal weakness or numbness. Psychiatric:  No anxiety or depression.   Endocrine:  No hot/cold intolerance, changes in energy, or sleep difficulty.  10-point ROS otherwise negative.  ____________________________________________   PHYSICAL EXAM:  VITAL SIGNS: ED Triage Vitals  Enc Vitals Group     BP 08/28/15 1302 118/81 mmHg     Pulse Rate 08/28/15 1302 85     Resp 08/28/15 1302 20     Temp 08/28/15 1302 98.4 F (36.9 C)     Temp Source 08/28/15 1302 Oral     SpO2 08/28/15 1302  99 %     Weight 08/28/15 1302 105 lb (47.628 kg)     Height 08/28/15 1302 5\' 2"  (1.575 m)     Head Cir --      Peak Flow --      Pain Score 08/28/15 1303 5     Pain Loc --      Pain Edu? --      Excl. in GC? --      Constitutional:   Alert and oriented. Well appearing and in no distress. Eyes:   No scleral icterus. No conjunctival pallor. PERRL. EOMI ENT   Head:   Normocephalic and atraumatic.   Nose:   No congestion/rhinnorhea. No septal hematoma   Mouth/Throat:   MMM, no pharyngeal erythema. No peritonsillar mass. No uvula shift.   Neck:   No stridor. No SubQ emphysema. No meningismus. Hematological/Lymphatic/Immunilogical:   No cervical lymphadenopathy. Cardiovascular:   RRR. Normal and symmetric distal pulses are present in all extremities. No murmurs, rubs, or gallops. Respiratory:   Normal respiratory effort without tachypnea nor retractions. Breath sounds are clear and equal bilaterally. No wheezes/rales/rhonchi. Gastrointestinal:   Soft with mild suprapubic tendernesser. No distention. There is no CVA tenderness.  No rebound, rigidity, or guarding. Genitourinary:   deferred Musculoskeletal:   Nontender with normal range of motion in all extremities. No joint effusions.  No lower extremity tenderness.  No edema. Neurologic:   Baseline speech and language.  CN 2-10 normal. Motor grossly intact. No pronator drift.  Normal gait. No gross focal neurologic deficits are appreciated.  Skin:    Skin is warm, dry and intact. No rash noted.  No petechiae, purpura, or bullae. Psychiatric:   Mood and affect are normal. Speech and behavior are normal. Patient exhibits appropriate insight and judgment.  ____________________________________________    LABS (pertinent positives/negatives) (all labs ordered are listed, but only abnormal results are displayed) Labs Reviewed  URINALYSIS COMPLETEWITH MICROSCOPIC ([facility] ONLY) - Abnormal; Notable for the following:    Color,  Urine YELLOW (*)    APPearance CLEAR (*)    Squamous Epithelial / LPF 0-5 (*)    All other components within normal limits   ____________________________________________   EKG    ____________________________________________    RADIOLOGY    ____________________________________________   PROCEDURES   ____________________________________________   INITIAL IMPRESSION / ASSESSMENT AND PLAN / ED COURSE  Pertinent labs & imaging results that were available during my care of the patient were reviewed by me and considered in  my medical decision making (see chart for details).  Patient is an apparent baseline. Vital signs are normal. She is medically stable no acute distress. Low suspicion for torsion or appendicitis. There is no right lower quadrant tenderness at all. Urinalysis is unremarkable and there is no evidence of urinary tract infection or pyelonephritis. Low suspicion for sepsis. We'll discharge home have her take naproxen as needed for pain and follow-up with gynecology.     ____________________________________________   FINAL CLINICAL IMPRESSION(S) / ED DIAGNOSES  Final diagnoses:  Abdominal pain, chronic, right lower quadrant      Sharman Cheek, MD 08/28/15 1546

## 2015-08-28 NOTE — ED Notes (Signed)
Mother states pt needs her right overy and appendix out, states she is awaiting cardiac clearance for surgery but mother states she hasn't felt good so has not taken pt to the MD and states "I figured I would go ahead and just bring her in before it pops", pt mentally challenged

## 2015-08-28 NOTE — ED Notes (Signed)
Pt to ed with c/o right lower quad pain.  Pt states known right ovarian cyst and scheduled for surgery.  Pt states pain in same area.

## 2015-08-28 NOTE — Discharge Instructions (Signed)
Abdominal Pain °Many things can cause abdominal pain. Usually, abdominal pain is not caused by a disease and will improve without treatment. It can often be observed and treated at home. Your health care provider will do a physical exam and possibly order blood tests and X-rays to help determine the seriousness of your pain. However, in many cases, more time must pass before a clear cause of the pain can be found. Before that point, your health care provider may not know if you need more testing or further treatment. °HOME CARE INSTRUCTIONS  °Monitor your abdominal pain for any changes. The following actions may help to alleviate any discomfort you are experiencing: °· Only take over-the-counter or prescription medicines as directed by your health care provider. °· Do not take laxatives unless directed to do so by your health care provider. °· Try a clear liquid diet (broth, tea, or water) as directed by your health care provider. Slowly move to a bland diet as tolerated. °SEEK MEDICAL CARE IF: °· You have unexplained abdominal pain. °· You have abdominal pain associated with nausea or diarrhea. °· You have pain when you urinate or have a bowel movement. °· You experience abdominal pain that wakes you in the night. °· You have abdominal pain that is worsened or improved by eating food. °· You have abdominal pain that is worsened with eating fatty foods. °· You have a fever. °SEEK IMMEDIATE MEDICAL CARE IF:  °· Your pain does not go away within 2 hours. °· You keep throwing up (vomiting). °· Your pain is felt only in portions of the abdomen, such as the right side or the left lower portion of the abdomen. °· You pass bloody or black tarry stools. °MAKE SURE YOU: °· Understand these instructions.   °· Will watch your condition.   °· Will get help right away if you are not doing well or get worse.   °Document Released: 09/08/2005 Document Revised: 12/04/2013 Document Reviewed: 08/08/2013 °[employer]® Patient Information  ©2015 [employer]. This information is not intended to replace advice given to you by your health care provider. Make sure you discuss any questions you have with your health care provider. ° °Abdominal Pain, Women °Abdominal (stomach, pelvic, or belly) pain can be caused by many things. It is important to tell your doctor: °· The location of the pain. °· Does it come and go or is it present all the time? °· Are there things that start the pain (eating certain foods, exercise)? °· Are there other symptoms associated with the pain (fever, nausea, vomiting, diarrhea)? °All of this is helpful to know when trying to find the cause of the pain. °CAUSES  °· Stomach: virus or bacteria infection, or ulcer. °· Intestine: appendicitis (inflamed appendix), regional ileitis (Crohn's disease), ulcerative colitis (inflamed colon), irritable bowel syndrome, diverticulitis (inflamed diverticulum of the colon), or cancer of the stomach or intestine. °· Gallbladder disease or stones in the gallbladder. °· Kidney disease, kidney stones, or infection. °· Pancreas infection or cancer. °· Fibromyalgia (pain disorder). °· Diseases of the female organs: °¨ Uterus: fibroid (non-cancerous) tumors or infection. °¨ Fallopian tubes: infection or tubal pregnancy. °¨ Ovary: cysts or tumors. °¨ Pelvic adhesions (scar tissue). °¨ Endometriosis (uterus lining tissue growing in the pelvis and on the pelvic organs). °¨ Pelvic congestion syndrome (female organs filling up with blood just before the menstrual period). °¨ Pain with the menstrual period. °¨ Pain with ovulation (producing an egg). °¨ Pain with an IUD (intrauterine device, birth control) in the uterus. °¨   Cancer of the female organs. °· Functional pain (pain not caused by a disease, may improve without treatment). °· Psychological pain. °· Depression. °DIAGNOSIS  °Your doctor will decide the seriousness of your pain by doing an examination. °· Blood tests. °· X-rays. °· Ultrasound. °· CT  scan (computed tomography, special type of X-ray). °· MRI (magnetic resonance imaging). °· Cultures, for infection. °· Barium enema (dye inserted in the large intestine, to better view it with X-rays). °· Colonoscopy (looking in intestine with a lighted tube). °· Laparoscopy (minor surgery, looking in abdomen with a lighted tube). °· Major abdominal exploratory surgery (looking in abdomen with a large incision). °TREATMENT  °The treatment will depend on the cause of the pain.  °· Many cases can be observed and treated at home. °· Over-the-counter medicines recommended by your caregiver. °· Prescription medicine. °· Antibiotics, for infection. °· Birth control pills, for painful periods or for ovulation pain. °· Hormone treatment, for endometriosis. °· Nerve blocking injections. °· Physical therapy. °· Antidepressants. °· Counseling with a psychologist or psychiatrist. °· Minor or major surgery. °HOME CARE INSTRUCTIONS  °· Do not take laxatives, unless directed by your caregiver. °· Take over-the-counter pain medicine only if ordered by your caregiver. Do not take aspirin because it can cause an upset stomach or bleeding. °· Try a clear liquid diet (broth or water) as ordered by your caregiver. Slowly move to a bland diet, as tolerated, if the pain is related to the stomach or intestine. °· Have a thermometer and take your temperature several times a day, and record it. °· Bed rest and sleep, if it helps the pain. °· Avoid sexual intercourse, if it causes pain. °· Avoid stressful situations. °· Keep your follow-up appointments and tests, as your caregiver orders. °· If the pain does not go away with medicine or surgery, you may try: °¨ Acupuncture. °¨ Relaxation exercises (yoga, meditation). °¨ Group therapy. °¨ Counseling. °SEEK MEDICAL CARE IF:  °· You notice certain foods cause stomach pain. °· Your home care treatment is not helping your pain. °· You need stronger pain medicine. °· You want your IUD  removed. °· You feel faint or lightheaded. °· You develop nausea and vomiting. °· You develop a rash. °· You are having side effects or an allergy to your medicine. °SEEK IMMEDIATE MEDICAL CARE IF:  °· Your pain does not go away or gets worse. °· You have a fever. °· Your pain is felt only in portions of the abdomen. The right side could possibly be appendicitis. The left lower portion of the abdomen could be colitis or diverticulitis. °· You are passing blood in your stools (bright red or black tarry stools, with or without vomiting). °· You have blood in your urine. °· You develop chills, with or without a fever. °· You pass out. °MAKE SURE YOU:  °· Understand these instructions. °· Will watch your condition. °· Will get help right away if you are not doing well or get worse. °Document Released: 09/26/2007 Document Revised: 04/15/2014 Document Reviewed: 10/16/2009 °ExitCare® Patient Information ©2015 [employer]. This information is not intended to replace advice given to you by your health care provider. Make sure you discuss any questions you have with your health care provider. ° °

## 2015-09-03 ENCOUNTER — Telehealth: Payer: Self-pay | Admitting: *Deleted

## 2015-09-03 NOTE — Telephone Encounter (Signed)
Patient's surgery has been scheduled for 09-25-15 at time of surgery with Dr. Tiburcio Pea.

## 2015-09-08 ENCOUNTER — Other Ambulatory Visit: Payer: Self-pay | Admitting: General Surgery

## 2015-09-08 DIAGNOSIS — K389 Disease of appendix, unspecified: Secondary | ICD-10-CM

## 2015-09-08 NOTE — Telephone Encounter (Signed)
Orders completed. I will go first of that is okay with Dr. Tiburcio Pea.

## 2015-09-16 ENCOUNTER — Other Ambulatory Visit: Payer: Medicaid Other

## 2015-09-17 ENCOUNTER — Encounter
Admission: RE | Admit: 2015-09-17 | Discharge: 2015-09-17 | Disposition: A | Discharge status: Home / Self Care | Payer: Medicaid Other | Source: Ambulatory Visit | Attending: Obstetrics & Gynecology | Admitting: Obstetrics & Gynecology

## 2015-09-17 DIAGNOSIS — Z01818 Encounter for other preprocedural examination: Secondary | ICD-10-CM | POA: Insufficient documentation

## 2015-09-17 HISTORY — DX: Other specified postprocedural states: R11.2

## 2015-09-17 HISTORY — DX: Other specified postprocedural states: Z98.890

## 2015-09-17 LAB — SURGICAL PCR SCREEN
MRSA, PCR: NEGATIVE
STAPHYLOCOCCUS AUREUS: NEGATIVE

## 2015-09-17 LAB — CBC
HCT: 38.7 % (ref 35.0–47.0)
HEMOGLOBIN: 13.5 g/dL (ref 12.0–16.0)
MCH: 30 pg (ref 26.0–34.0)
MCHC: 34.8 g/dL (ref 32.0–36.0)
MCV: 86.2 fL (ref 80.0–100.0)
PLATELETS: 151 10*3/uL (ref 150–440)
RBC: 4.5 MIL/uL (ref 3.80–5.20)
RDW: 13.1 % (ref 11.5–14.5)
WBC: 9.2 10*3/uL (ref 3.6–11.0)

## 2015-09-17 NOTE — Patient Instructions (Signed)
  Your procedure is scheduled on: Thursday September 25, 2015. Report to Same Day Surgery. To find out your arrival time please call [phone] between 1PM - 3PM on Wednesday Sep 24, 2015.  Remember: Instructions that are not followed completely may result in serious medical risk, up to and including death, or upon the discretion of your surgeon and anesthesiologist your surgery may need to be rescheduled.    __x__ 1. Do not eat food or drink liquids after midnight. No gum chewing or hard candies.     ____ 2. No Alcohol for 24 hours before or after surgery.   ____ 3. Bring all medications with you on the day of surgery if instructed.    _x___ 4. Notify your doctor if there is any change in your medical condition     (cold, fever, infections).     Do not wear jewelry, make-up, hairpins, clips or nail polish.  Do not wear lotions, powders, or perfumes. You may wear deodorant.  Do not shave 48 hours prior to surgery. Men may shave face and neck.  Do not bring valuables to the hospital.    [facility] is not responsible for any belongings or valuables.               Contacts, dentures or bridgework may not be worn into surgery.  Leave your suitcase in the car. After surgery it may be brought to your room.  For patients admitted to the hospital, discharge time is determined by your treatment team.   Patients discharged the day of surgery will not be allowed to drive home.    Please read over the following fact sheets that you were given:   [facility] Preparing for Surgery  _x___ Take these medicines the morning of surgery with A SIP OF WATER:    1. pregabalin (LYRICA)   2. methylphenidate (RITALIN)     ____ Fleet Enema (as directed)   _x__ Use CHG Soap as directed  ____ Use inhalers on the day of surgery  ____ Stop metformin 2 days prior to surgery    ____ Take 1/2 of usual insulin dose the night before surgery and none on the morning of surgery.   ____ Stop  Coumadin/Plavix/aspirin on does not apply.  __x__ Stop Anti-inflammatories such as Aleve or Ibuprofen now.  May take Tylenol for pain.   ____ Stop supplements until after surgery.    ____ Bring C-Pap to the hospital.

## 2015-09-18 LAB — TYPE AND SCREEN
ABO/RH(D): O POS
Antibody Screen: NEGATIVE

## 2015-09-24 ENCOUNTER — Other Ambulatory Visit
Admission: RE | Admit: 2015-09-24 | Discharge: 2015-09-24 | Disposition: A | Discharge status: Home / Self Care | Payer: Medicaid Other | Source: Ambulatory Visit | Attending: Anesthesiology | Admitting: Anesthesiology

## 2015-09-24 ENCOUNTER — Encounter: Payer: Self-pay | Admitting: Anesthesiology

## 2015-09-24 DIAGNOSIS — Z01818 Encounter for other preprocedural examination: Secondary | ICD-10-CM | POA: Diagnosis not present

## 2015-09-24 DIAGNOSIS — R1031 Right lower quadrant pain: Secondary | ICD-10-CM | POA: Diagnosis present

## 2015-09-24 DIAGNOSIS — N83201 Unspecified ovarian cyst, right side: Secondary | ICD-10-CM | POA: Diagnosis not present

## 2015-09-24 LAB — MAGNESIUM: Magnesium: 1 mg/dL — ABNORMAL LOW (ref 1.7–2.4)

## 2015-09-24 LAB — BASIC METABOLIC PANEL
ANION GAP: 11 (ref 5–15)
BUN: 16 mg/dL (ref 6–20)
CHLORIDE: 101 mmol/L (ref 101–111)
CO2: 28 mmol/L (ref 22–32)
Calcium: 8.9 mg/dL (ref 8.9–10.3)
Creatinine, Ser: 0.8 mg/dL (ref 0.44–1.00)
GFR calc non Af Amer: >60 mL/min (ref >60)
Glucose, Bld: 93 mg/dL (ref 65–99)
Potassium: 3 mmol/L — ABNORMAL LOW (ref 3.5–5.1)
Sodium: 140 mmol/L (ref 135–145)

## 2015-09-24 NOTE — OR Nursing (Signed)
Telephone call to Dr. Henrene HawkingKephart to review results of labwork drawn today at 5 PM. Aware that Magnesium is low but patient runs between 0.9 to 1.1 frequently.  Today she is 1.0.  Calcium is also slightly low at 8.8.  Of concern is the potassium of 3.0.  Dr. Henrene HawkingKephart unsure of what to do at this late time for treatment of potassium level.  Will recheck potassium in the morning prior to surgery.

## 2015-09-25 ENCOUNTER — Ambulatory Visit
Admission: RE | Admit: 2015-09-25 | Payer: Medicaid Other | Source: Ambulatory Visit | Admitting: Obstetrics & Gynecology

## 2015-09-25 SURGERY — LAPAROSCOPY OPERATIVE
Anesthesia: Choice

## 2015-09-25 MED ORDER — LACTATED RINGERS IV SOLN: INTRAVENOUS | Status: DC

## 2015-09-25 MED ORDER — DEXTROSE 5 % IV SOLN
2.0000 g | INTRAVENOUS | Status: AC
  Filled 2015-09-25: qty 2

## 2015-09-25 MED ORDER — FAMOTIDINE 20 MG PO TABS: 20.0000 mg | ORAL_TABLET | Freq: Once | ORAL | Status: DC

## 2015-09-25 MED ORDER — BUPIVACAINE HCL (PF) 0.5 % IJ SOLN
INTRAMUSCULAR | Status: AC
  Filled 2015-09-25: qty 30

## 2015-09-29 ENCOUNTER — Telehealth: Payer: Self-pay | Admitting: General Surgery

## 2015-09-29 NOTE — Telephone Encounter (Signed)
PT'S MOTHER TAMMY Mcclenney CALLED AGAIN & WANTS YOU TO REVIEW THE STUDIES PT HAD DONE @ [facility] RELATING TO [email or web address].I SPOKE WITH MEGAN AT [facility] & SHE IS GOING TO FAX INFO.

## 2015-09-30 ENCOUNTER — Telehealth: Payer: Self-pay | Admitting: General Surgery

## 2015-09-30 NOTE — Telephone Encounter (Signed)
I CALLED TO [facility] BECAUSE WE HAD NOT RECEIVED REC'S & AGAIN SPOKE WITH MEGAN.SHE KNOW STATES THE PT MUST SIGN A MED REC RELEASE IN THERE OFC BEFORE THE REC'S CAN BE RELEASED, SINCE WE AREN'T REQUESTING & CHELSE HOLLAND NP HASN'T REF'D PT TO US. I HAVE CALLED & LEFT A MESSAGE FOR MOTHER TAMMY Peschke AND MADE  HER AWARE OF THE ABOVE.

## 2015-10-04 ENCOUNTER — Telehealth: Payer: Self-pay | Admitting: General Surgery

## 2015-10-04 NOTE — Telephone Encounter (Signed)
The patient's daughter, [name] has been seen in the past in regards to an appendicolith. Planned combined surgery with GYN for right ovary removal and appendectomy had been scheduled and canceled 3 times, most recently last week.  [name] reported that she had been instructed to contact this office regarding the CT report.  The patient recently had a CT scan completed on 09/23/2015 requested by her PCP, Rockney Gheehelsea Holland, ANP. The mother brought a copy of the report. Gallbladder was reported as mildly slightly distended.  Indication for study on the report: Renal cyst, cerebral palsy, pacemaker.  No films were available for review.  No symptoms reported by the mother.  No cause for intervention at this time. CT report was received for information, but again the patient was not examined and films were not able to be reviewed. Indications for the exam based on clinical abdominal symptoms are unknown at this time.

## 2016-05-28 ENCOUNTER — Emergency Department
Admission: EM | Admit: 2016-05-28 | Discharge: 2016-05-28 | Disposition: A | Discharge status: Home / Self Care | Payer: Medicaid Other | Attending: Emergency Medicine | Admitting: Emergency Medicine

## 2016-05-28 ENCOUNTER — Encounter: Payer: Self-pay | Admitting: Emergency Medicine

## 2016-05-28 DIAGNOSIS — S01512A Laceration without foreign body of oral cavity, initial encounter: Secondary | ICD-10-CM | POA: Diagnosis not present

## 2016-05-28 DIAGNOSIS — Y939 Activity, unspecified: Secondary | ICD-10-CM | POA: Diagnosis not present

## 2016-05-28 DIAGNOSIS — Z950 Presence of cardiac pacemaker: Secondary | ICD-10-CM | POA: Diagnosis not present

## 2016-05-28 DIAGNOSIS — Y999 Unspecified external cause status: Secondary | ICD-10-CM | POA: Insufficient documentation

## 2016-05-28 DIAGNOSIS — Y92002 Bathroom of unspecified non-institutional (private) residence single-family (private) house as the place of occurrence of the external cause: Secondary | ICD-10-CM | POA: Insufficient documentation

## 2016-05-28 DIAGNOSIS — Z79899 Other long term (current) drug therapy: Secondary | ICD-10-CM | POA: Diagnosis not present

## 2016-05-28 DIAGNOSIS — S01511A Laceration without foreign body of lip, initial encounter: Secondary | ICD-10-CM | POA: Insufficient documentation

## 2016-05-28 DIAGNOSIS — G809 Cerebral palsy, unspecified: Secondary | ICD-10-CM | POA: Insufficient documentation

## 2016-05-28 DIAGNOSIS — W01198A Fall on same level from slipping, tripping and stumbling with subsequent striking against other object, initial encounter: Secondary | ICD-10-CM | POA: Insufficient documentation

## 2016-05-28 DIAGNOSIS — Z791 Long term (current) use of non-steroidal anti-inflammatories (NSAID): Secondary | ICD-10-CM | POA: Diagnosis not present

## 2016-05-28 MED ORDER — PENICILLIN V POTASSIUM 500 MG PO TABS: 500.0000 mg | ORAL_TABLET | Freq: Four times a day (QID) | ORAL | Status: DC

## 2016-05-28 NOTE — ED Provider Notes (Signed)
CSN: [id number]     Arrival date & time 05/28/16  1851 History   First MD Initiated Contact with Patient 05/28/16 1936     Chief Complaint  Patient presents with  . Fall  . Lip Laceration     (Consider location/radiation/quality/duration/timing/severity/associated sxs/prior Treatment) HPI  35 year old female with laceration to the left lower lip after falling in the bathroom. Patient states she tripped and fell, hitting her chin up against the handlebar. She bit her lower lip causing a intraoral laceration and the outer lip laceration to the lower lip. Patient denies losing consciousness before or after falling. No headaches she denies any other injuries to her body. Pain is 1 out of 10. She denies any chest pain shortness of breath or abdominal pain. She is ambulatory. Tetanus is up-to-date.  Past Medical History  Diagnosis Date  . Cerebral palsy (HCC)   . Hypokalemia     now resolved  . 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  . Chronic back pain   . Pacemaker   . SA node dysfunction (HCC) 2008  . Magnesium deficiency   . PONV (postoperative nausea and vomiting)   . Gitelman syndrome   . Carpal tunnel syndrome   . RSD (reflex sympathetic dystrophy)     right foot,   . RSD (reflex sympathetic dystrophy)    Past Surgical History  Procedure Laterality Date  . Pacemaker insertion    . Orthopedic surgery    . Portacath placement    . Insert / replace / remove pacemaker    . Abdominal hysterectomy Left 2013    [name]   Family History  Problem Relation Age of Onset  . Diabetes Mother   . Hypertension Mother    Social History  Substance Use Topics  . Smoking status: Never Smoker   . Smokeless tobacco: Never Used  . Alcohol Use: No   OB History    Gravida Para Term Preterm AB TAB SAB Ectopic Multiple Living   0 0 0 0 0 0 0 0 0 0      Review of Systems  Constitutional: Negative for fever, chills, activity change and fatigue.  HENT:  Negative for congestion, sinus pressure and sore throat.   Eyes: Negative for visual disturbance.  Respiratory: Negative for cough, chest tightness and shortness of breath.   Cardiovascular: Negative for chest pain and leg swelling.  Gastrointestinal: Negative for nausea, vomiting, abdominal pain and diarrhea.  Genitourinary: Negative for dysuria.  Musculoskeletal: Negative for arthralgias and gait problem.  Skin: Positive for wound.  Neurological: Negative for weakness, numbness and headaches.  Hematological: Negative for adenopathy.  Psychiatric/Behavioral: Negative for behavioral problems, confusion and agitation.      Allergies  Cymbalta; Gabapentin; Prozac; and Sulfa antibiotics  Home Medications   Prior to Admission medications   Medication Sig Start Date End Date Taking? Authorizing Provider  hydrocortisone valerate ointment (WESTCORT) 0.2 % Apply 1 application topically 2 (two) times daily. Patient not taking: Reported on 09/17/2015 08/16/15   [name], MD  magnesium sulfate in dextrose 5 % 250 mL Inject into the vein once a week.    Historical Provider, MD  methylphenidate (RITALIN) 20 MG tablet Take 20 mg by mouth 3 (three) times daily.     Historical Provider, MD  mupirocin ointment (BACTROBAN) 2 % Apply 1 application topically 2 (two) times daily. To sore patches of leg rash Patient not taking: Reported on 09/17/2015 08/16/15   [name], MD  naproxen (NAPROSYN) 500 MG tablet Take 1 tablet (500 mg total) by mouth 2 (two) times daily with a meal. Patient not taking: Reported on 09/17/2015 08/28/15   Sharman Cheek, MD  naproxen sodium (ANAPROX) 220 MG tablet Take 220 mg by mouth 2 (two) times daily with a meal.    Historical Provider, MD  penicillin v potassium (VEETID) 500 MG tablet Take 1 tablet (500 mg total) by mouth 4 (four) times daily. 05/28/16   Evon Slack, PA-C  pregabalin (LYRICA) 50 MG capsule Take 75 mg by mouth 3 (three) times daily.     Historical  Provider, MD   BP 115/86 mmHg  Pulse 86  Temp(Src) 98.4 F (36.9 C) (Oral)  Resp 18  Ht  (1.6 m)  Wt 44.453 kg  BMI 17.36 kg/m2  SpO2 96%  LMP 06/09/2012 Physical Exam  Constitutional: She is oriented to person, place, and time. She appears well-developed and well-nourished. No distress.  HENT:  Head: Normocephalic.  Mouth/Throat: Oropharynx is clear and moist.  Patient with a 0.5 cm intraoral laceration along the right lower lip. There is no gaping of the wound margins. Patient also has a 1 cm laceration on the external lower lip. No through and through laceration. There is no dental injury. Laceration does not cross the vermilion border.  Eyes: EOM are normal. Pupils are equal, round, and reactive to light. Right eye exhibits no discharge. Left eye exhibits no discharge.  Neck: Normal range of motion. Neck supple.  Cardiovascular: Normal rate, regular rhythm and intact distal pulses.   Pulmonary/Chest: Effort normal and breath sounds normal. No respiratory distress. She exhibits no tenderness.  Abdominal: Soft. She exhibits no distension. There is no tenderness.  Musculoskeletal: Normal range of motion. She exhibits no edema.  Neurological: She is alert and oriented to person, place, and time. She has normal reflexes.  Skin: Skin is warm and dry.  Psychiatric: She has a normal mood and affect. Her behavior is normal. Thought content normal.    ED Course  Procedures (including critical care time) LACERATION REPAIR Performed by: Patience Musca Authorized by: Patience Musca Consent: Verbal consent obtained. Risks and benefits: risks, benefits and alternatives were discussed Consent given by: patient Patient identity confirmed: provided demographic data Prepped and Draped in normal sterile fashion Wound explored  Laceration Location: Lower lip  Laceration Length: 1cm  No Foreign Bodies seen or palpated  Anesthesia: local infiltration  Local  anesthetic: None   Anesthetic total: 0 ml  Irrigation method: syringe Amount of cleaning: standard  Skin closure: 6-0 nylon   Number of sutures: 1   Technique: Simple interrupted   Patient tolerance: Patient tolerated the procedure well with no immediate complications. Labs Review Labs Reviewed - No data to display  Imaging Review No results found. I have personally reviewed and evaluated these images and lab results as part of my medical decision-making.   EKG Interpretation None      MDM   Final diagnoses:  Laceration of lip, initial encounter  Intraoral laceration, initial encounter    35 year old female tripped, fell suffering a intraoral and outer lip laceration. Intraoral laceration less than 0.5 cm in length, non-gaping, not requiring sutures. External laceration 1 cm in length, repaired with one 6-0 nylon suture. She'll follow-up in 5 days for suture removal. She is placed on prophylactic antibodies for intraoral laceration.    Evon Slack, PA-C 05/28/16 2001  Minna Antis, MD 05/28/16 2252

## 2016-05-28 NOTE — ED Notes (Signed)
Pt fell in bathroom, pain to chin, small lac noted as well, bleeding controlled.

## 2016-05-28 NOTE — Discharge Instructions (Signed)
Facial Laceration ° A facial laceration is a cut on the face. These injuries can be painful and cause bleeding. Lacerations usually heal quickly, but they need special care to reduce scarring. °DIAGNOSIS  °Your health care provider will take a medical history, ask for details about how the injury occurred, and examine the wound to determine how deep the cut is. °TREATMENT  °Some facial lacerations may not require closure. Others may not be able to be closed because of an increased risk of infection. The risk of infection and the chance for successful closure will depend on various factors, including the amount of time since the injury occurred. °The wound may be cleaned to help prevent infection. If closure is appropriate, pain medicines may be given if needed. Your health care provider will use stitches (sutures), wound glue (adhesive), or skin adhesive strips to repair the laceration. These tools bring the skin edges together to allow for faster healing and a better cosmetic outcome. If needed, you may also be given a tetanus shot. °HOME CARE INSTRUCTIONS °· Only take over-the-counter or prescription medicines as directed by your health care provider. °· Follow your health care provider's instructions for wound care. These instructions will vary depending on the technique used for closing the wound. °For Sutures: °· Keep the wound clean and dry.   °· If you were given a bandage (dressing), you should change it at least once a day. Also change the dressing if it becomes wet or dirty, or as directed by your health care provider.   °· Wash the wound with soap and water 2 times a day. Rinse the wound off with water to remove all soap. Pat the wound dry with a clean towel.   °· After cleaning, apply a thin layer of the antibiotic ointment recommended by your health care provider. This will help prevent infection and keep the dressing from sticking.   °· You may shower as usual after the first 24 hours. Do not soak the  wound in water until the sutures are removed.   °· Get your sutures removed as directed by your health care provider. With facial lacerations, sutures should usually be taken out after 4-5 days to avoid stitch marks.   °· Wait a few days after your sutures are removed before applying any makeup. °For Skin Adhesive Strips: °· Keep the wound clean and dry.   °· Do not get the skin adhesive strips wet. You may bathe carefully, using caution to keep the wound dry.   °· If the wound gets wet, pat it dry with a clean towel.   °· Skin adhesive strips will fall off on their own. You may trim the strips as the wound heals. Do not remove skin adhesive strips that are still stuck to the wound. They will fall off in time.   °For Wound Adhesive: °· You may briefly wet your wound in the shower or bath. Do not soak or scrub the wound. Do not swim. Avoid periods of heavy sweating until the skin adhesive has fallen off on its own. After showering or bathing, gently pat the wound dry with a clean towel.   °· Do not apply liquid medicine, cream medicine, ointment medicine, or makeup to your wound while the skin adhesive is in place. This may loosen the film before your wound is healed.   °· If a dressing is placed over the wound, be careful not to apply tape directly over the skin adhesive. This may cause the adhesive to be pulled off before the wound is healed.   °· Avoid   prolonged exposure to sunlight or tanning lamps while the skin adhesive is in place.  The skin adhesive will usually remain in place for 5-10 days, then naturally fall off the skin. Do not pick at the adhesive film.  After Healing: Once the wound has healed, cover the wound with sunscreen during the day for 1 full year. This can help minimize scarring. Exposure to ultraviolet light in the first year will darken the scar. It can take 1-2 years for the scar to lose its redness and to heal completely.  SEEK MEDICAL CARE IF:  You have a fever. SEEK IMMEDIATE  MEDICAL CARE IF:  You have redness, pain, or swelling around the wound.   You see ayellowish-white fluid (pus) coming from the wound.    This information is not intended to replace advice given to you by your health care provider. Make sure you discuss any questions you have with your health care provider.   Document Released: 01/06/2005 Document Revised: 12/20/2014 Document Reviewed: 07/12/2013 Elsevier Interactive Patient Education [employer]2016 Else[employer] Inc. Please have sutures removed in 5 days.

## 2016-05-28 NOTE — ED Notes (Signed)
Laceration on lower lip closed with suture. Laceration is bleeding slightly that is controlled with pressure. Patient is in good spirits. Ice pack given to patient. NAD. PA sutured lower lip laceration prior to this RN assessing wound. Please PA notes for assessment.

## 2016-06-15 ENCOUNTER — Ambulatory Visit
Admission: EM | Admit: 2016-06-15 | Discharge: 2016-06-15 | Disposition: A | Discharge status: Home / Self Care | Payer: Medicaid Other | Attending: Family Medicine | Admitting: Family Medicine

## 2016-06-15 DIAGNOSIS — H0011 Chalazion right upper eyelid: Secondary | ICD-10-CM

## 2016-06-15 MED ORDER — ERYTHROMYCIN 5 MG/GM OP OINT: TOPICAL_OINTMENT | OPHTHALMIC | Status: DC

## 2016-06-15 NOTE — Discharge Instructions (Signed)
Chalazion A chalazion is a swelling or lump on the eyelid. It can affect the upper or lower eyelid. CAUSES This condition may be caused by:  Long-lasting (chronic) inflammation of the eyelid glands.  A blocked oil gland in the eyelid. SYMPTOMS Symptoms of this condition include:  A swelling on the eyelid. The swelling may spread to areas around the eye.  A hard lump on the eyelid. This lump may make it hard to see out of the eye. DIAGNOSIS This condition is diagnosed with an examination of the eye. TREATMENT This condition is treated by applying a warm compress to the eyelid. If the condition does not improve after two days, it may be treated with:  Surgery.  Medicine that is injected into the chalazion by a health care provider.  Medicine that is applied to the eye. HOME CARE INSTRUCTIONS  Do not touch the chalazion.  Do not try to remove the pus, such as by squeezing the chalazion or sticking it with a pin or needle.  Do not rub your eyes.  Wash your hands often. Dry your hands with a clean towel.  Keep your face, scalp, and eyebrows clean.  Avoid wearing eye makeup.  Apply a warm, moist compress to the eyelid 4-6 times a day for 10-15 minutes at a time. This will help to open any blocked glands and help to reduce redness and swelling.  Apply over-the-counter and prescription medicines only as told by your health care provider.  If the chalazion does not break open (rupture) on its own in a month, return to your health care provider.  Keep all follow-up appointments as told by your health care provider. This is important. SEEK MEDICAL CARE IF:  Your eyelid has not improved in 4 weeks.  Your eyelid is getting worse.  You have a fever.  The chalazion does not rupture on its own with home treatment in a month. SEEK IMMEDIATE MEDICAL CARE IF:  You have pain in your eye.  Your vision changes.  The chalazion becomes painful or red  The chalazion gets  bigger.   This information is not intended to replace advice given to you by your health care provider. Make sure you discuss any questions you have with your health care provider.   Document Released: 11/26/2000 Document Revised: 08/20/2015 Document Reviewed: 03/24/2015 Elsevier Interactive Patient Education 2016 Elsevier Inc.  

## 2016-06-15 NOTE — ED Provider Notes (Signed)
CSN: [id number]     Arrival date & time 06/15/16  1501 History   First MD Initiated Contact with Patient 06/15/16 1531     Chief Complaint  Patient presents with  . Belepharitis   (Consider location/radiation/quality/duration/timing/severity/associated sxs/prior Treatment) HPI  This a 35 year old female with cerebral palsy who comes in today stating that her right upper eyelid is red and tender. She has not had any conjunctivitis has not had any matting is not painful and has had no visual disturbances. This started about 2 days ago. She has a history of eczema. The left eye is unaffected.   Past Medical History  Diagnosis Date  . Cerebral palsy (HCC)   . Hypokalemia     now resolved  . 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  . Chronic back pain   . Pacemaker   . SA node dysfunction (HCC) 2008  . Magnesium deficiency   . PONV (postoperative nausea and vomiting)   . Gitelman syndrome   . Carpal tunnel syndrome   . RSD (reflex sympathetic dystrophy)     right foot,   . RSD (reflex sympathetic dystrophy)    Past Surgical History  Procedure Laterality Date  . Pacemaker insertion    . Orthopedic surgery    . Portacath placement    . Insert / replace / remove pacemaker    . Abdominal hysterectomy Left 2013    [name]   Family History  Problem Relation Age of Onset  . Diabetes Mother   . Hypertension Mother    Social History  Substance Use Topics  . Smoking status: Never Smoker   . Smokeless tobacco: Never Used  . Alcohol Use: No   OB History    Gravida Para Term Preterm AB TAB SAB Ectopic Multiple Living   0 0 0 0 0 0 0 0 0 0      Review of Systems  Constitutional: Negative for fever, chills and fatigue.  Eyes: Positive for pain and redness. Negative for photophobia, discharge, itching and visual disturbance.  All other systems reviewed and are negative.   Allergies  Cymbalta; Gabapentin; Prozac; and Sulfa antibiotics  Home  Medications   Prior to Admission medications   Medication Sig Start Date End Date Taking? Authorizing Provider  magnesium sulfate in dextrose 5 % 250 mL Inject into the vein once a week.   Yes Historical Provider, MD  methylphenidate (RITALIN) 20 MG tablet Take 20 mg by mouth 3 (three) times daily.    Yes Historical Provider, MD  pregabalin (LYRICA) 50 MG capsule Take 75 mg by mouth 3 (three) times daily.    Yes Historical Provider, MD  erythromycin ophthalmic ointment Place a 1/2 inch ribbon of ointment into the lower eyelid. 06/15/16   [name], PA-C  hydrocortisone valerate ointment (WESTCORT) 0.2 % Apply 1 application topically 2 (two) times daily. Patient not taking: Reported on 09/17/2015 08/16/15   [name], MD  mupirocin ointment (BACTROBAN) 2 % Apply 1 application topically 2 (two) times daily. To sore patches of leg rash Patient not taking: Reported on 09/17/2015 08/16/15   [name], MD  naproxen (NAPROSYN) 500 MG tablet Take 1 tablet (500 mg total) by mouth 2 (two) times daily with a meal. Patient not taking: Reported on 09/17/2015 08/28/15   [name], MD  naproxen sodium (ANAPROX) 220 MG tablet Take 220 mg by mouth 2 (two) times daily with a meal.    Historical Provider, MD  penicillin v potassium (VEETID) 500 MG tablet Take 1 tablet (500 mg total) by mouth 4 (four) times daily. 05/28/16   [name], PA-C   Meds Ordered and Administered this Visit  Medications - No data to display  BP 99/52 mmHg  Pulse 86  Temp(Src) 98.2 F (36.8 C) (Oral)  Resp 16  Ht 5\' 2"  (1.575 m)  Wt 98 lb (44.453 kg)  BMI 17.92 kg/m2  SpO2 100%  LMP 06/09/2012 No data found.   Physical Exam  Constitutional: She is oriented to person, place, and time. She appears well-developed and well-nourished.  HENT:  Head: Normocephalic and atraumatic.  Eyes: Conjunctivae and EOM are normal. Pupils are equal, round, and reactive to light. Right eye exhibits no discharge. Left eye  exhibits no discharge.  Examination of the right eye shows some swelling of the upper lid and some erythema. There does not appear to be any scaling of the eyelashes and no inflammation of the eyelashes. Due to patient cooperation and her cerebral palsy and was unable to leave her to let adequately was able to look under by pulling lid out. No foreign object was seen. Lower lid is unaffected. Conjunctiva is not erythematous  Neck: Normal range of motion. Neck supple.  Musculoskeletal: She exhibits no edema or tenderness.  She has cerebral palsy and is in a walker the seat.  Neurological: She is alert and oriented to person, place, and time.  Skin: Skin is warm and dry.  Psychiatric: She has a normal mood and affect. Her behavior is normal. Judgment and thought content normal.  Nursing note and vitals reviewed.   ED Course  Procedures (including critical care time)  Labs Review Labs Reviewed - No data to display  Imaging Review No results found.   Visual Acuity Review  Right Eye Distance:   Left Eye Distance:   Bilateral Distance:    Right Eye Near:   Left Eye Near:    Bilateral Near:         MDM   1. Chalazion of right upper eyelid    Discharge Medication List as of 06/15/2016  3:58 PM    START taking these medications   Details  erythromycin ophthalmic ointment Place a 1/2 inch ribbon of ointment into the lower eyelid., Print      Plan: 1. Test/x-ray results and diagnosis reviewed with patient 2. rx as per orders; risks, benefits, potential side effects reviewed with patient 3. Recommend supportive treatment with Warm compresses 3-4 times a day, lasting 5-10 minutes. If she's not improved in 24-48 hours recommended that she be [facility] at [facility]. The phone number and address were provided to the patient. 4. F/u prn if symptoms worsen or don't improve     Lutricia FeilWilliam P Rim Thatch, PA-C 06/15/16 1610

## 2016-06-15 NOTE — ED Notes (Signed)
Patient complains of eyelids red. Patient states that she has right eyelid swelling. Patient states that symptoms started 2 days ago.

## 2016-09-22 ENCOUNTER — Emergency Department
Admission: EM | Admit: 2016-09-22 | Discharge: 2016-09-22 | Disposition: A | Discharge status: Home / Self Care | Payer: Medicaid Other | Attending: Emergency Medicine | Admitting: Emergency Medicine

## 2016-09-22 DIAGNOSIS — G8918 Other acute postprocedural pain: Secondary | ICD-10-CM

## 2016-09-22 DIAGNOSIS — Z792 Long term (current) use of antibiotics: Secondary | ICD-10-CM | POA: Diagnosis not present

## 2016-09-22 DIAGNOSIS — Z79899 Other long term (current) drug therapy: Secondary | ICD-10-CM | POA: Insufficient documentation

## 2016-09-22 DIAGNOSIS — M545 Low back pain, unspecified: Secondary | ICD-10-CM

## 2016-09-22 DIAGNOSIS — G809 Cerebral palsy, unspecified: Secondary | ICD-10-CM | POA: Diagnosis not present

## 2016-09-22 NOTE — ED Notes (Signed)
Mother cursing at this RN in hallway, wanting [facility]. [facility] multiple times [facility]. Mother refusing to sign discharge at this time. Pt unable to sign for discharge. Patient relationship and charge RN informed, MD informed.

## 2016-09-22 NOTE — ED Triage Notes (Signed)
Pt arrives to ER via [facility] c/o back pain. Pt had right knee bursitis surgery yesterday and arrives in hard cast. Pt CBG 107. Pt atrial paced with EMS EKG

## 2016-09-22 NOTE — ED Provider Notes (Signed)
[facility] Emergency Department Provider Note  ____________________________________________   First MD Initiated Contact with Patient 09/22/16 1432     (approximate)  I have reviewed the triage vital signs and the nursing notes.   HISTORY  Chief Complaint Back Pain    HPI [name] is a 35 y.o. female with whom I am familiar from many prior emergency department visits and who has extensive chronic medical conditions including cerebral palsy, POTS disease, and who recently had [facility] by [facility].  She presents today by EMS initially with complaint of back pain as per triage.  However when I spoke with patient and her mother first she was concerned about the cast being too tight and causing some pain and then it was somewhat unclear exactly why they had come in.  I specifically asked the mother with what I can help them today, and she said "I do not know."  The patient does have a history of occasionally syncopizing due to the POTS, but she has not done so recently.  She has had a small amount of bleeding from her knee but the bleeding is currently controlled.  Patient states that she feels bad in general and that she wants the cast off but is not specific about what in particular is bothering her at the moment.  Neither the patient nor her mother can identify a severity of her pain or conditions at this time.   Past Medical History:  Diagnosis Date  . Carpal tunnel syndrome   . Cerebral palsy (HCC)   . Chronic back pain   . Gitelman syndrome   . Hypokalemia    now resolved  . Magnesium deficiency   . Pacemaker   . PONV (postoperative nausea and vomiting)   . 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  . RSD (reflex sympathetic dystrophy)    right foot,   . RSD (reflex sympathetic dystrophy)   . SA node dysfunction (HCC) 2008    Patient Active Problem List   Diagnosis Date Noted  . Right ovarian cyst  05/31/2015  . Abdominal pain, chronic, right lower quadrant 05/23/2015  . Hypomagnesemia 03/02/2014  . Back pain 03/02/2014    Past Surgical History:  Procedure Laterality Date  . ABDOMINAL HYSTERECTOMY Left 2013   [name]  . INSERT / REPLACE / REMOVE PACEMAKER    . ORTHOPEDIC SURGERY    . PACEMAKER INSERTION    . PORTACATH PLACEMENT      Prior to Admission medications   Medication Sig Start Date End Date Taking? Authorizing Provider  erythromycin ophthalmic ointment Place a 1/2 inch ribbon of ointment into the lower eyelid. 06/15/16   Lutricia FeilWilliam P Roemer, PA-C  hydrocortisone valerate ointment (WESTCORT) 0.2 % Apply 1 application topically 2 (two) times daily. Patient not taking: Reported on 09/17/2015 08/16/15   [name], MD  magnesium sulfate in dextrose 5 % 250 mL Inject into the vein once a week.    Historical Provider, MD  methylphenidate (RITALIN) 20 MG tablet Take 20 mg by mouth 3 (three) times daily.     Historical Provider, MD  mupirocin ointment (BACTROBAN) 2 % Apply 1 application topically 2 (two) times daily. To sore patches of leg rash Patient not taking: Reported on 09/17/2015 08/16/15   [name], MD  naproxen (NAPROSYN) 500 MG tablet Take 1 tablet (500 mg total) by mouth 2 (two) times daily with a meal. Patient not taking: Reported on 09/17/2015 08/28/15  Sharman Cheek, MD  naproxen sodium (ANAPROX) 220 MG tablet Take 220 mg by mouth 2 (two) times daily with a meal.    Historical Provider, MD  penicillin v potassium (VEETID) 500 MG tablet Take 1 tablet (500 mg total) by mouth 4 (four) times daily. 05/28/16   Evon Slack, PA-C  pregabalin (LYRICA) 50 MG capsule Take 75 mg by mouth 3 (three) times daily.     Historical Provider, MD    Allergies Cymbalta [duloxetine hcl]; Gabapentin; Prozac [fluoxetine hcl]; and Sulfa antibiotics  Family History  Problem Relation Age of Onset  . Diabetes Mother   . Hypertension Mother     Social History Social History    Substance Use Topics  . Smoking status: Never Smoker  . Smokeless tobacco: Never Used  . Alcohol use No    Review of Systems Constitutional: No fever/chills Eyes: No visual changes. ENT: No sore throat. Cardiovascular: Denies chest pain. Respiratory: Denies shortness of breath. Gastrointestinal: No abdominal pain.  No nausea, no vomiting.  No diarrhea.  No constipation. Genitourinary: Negative for dysuria. Musculoskeletal: Pain in lower back and in right lower extremity including her knee on which she just recently had surgery Skin: Negative for rash. Neurological: Baseline neurological status with cerebral palsy  10-point ROS otherwise negative.  ____________________________________________   PHYSICAL EXAM:  VITAL SIGNS: ED Triage Vitals [09/22/16 1332]  Enc Vitals Group     BP 108/75     Pulse Rate (!) 101     Resp (!) 22     Temp 97.8 F (36.6 C)     Temp Source Oral     SpO2 100 %     Weight 96 lb (43.5 kg)     Height      Head Circumference      Peak Flow      Pain Score 7     Pain Loc      Pain Edu?      Excl. in GC?     Constitutional: Alert and oriented. Has the appearance of chronic illness but is at her baseline Eyes: Conjunctivae are normal. PERRL. EOMI. Head: Atraumatic. Nose: No congestion/rhinnorhea. Mouth/Throat: Mucous membranes are moist.  Oropharynx non-erythematous. Neck: No stridor.  No meningeal signs.   Cardiovascular: Normal rate (initial borderline tachycardia resolved), regular rhythm. Good peripheral circulation. Grossly normal heart sounds. Respiratory: Normal respiratory effort.  No retractions. Lungs CTAB. Gastrointestinal: Soft and nontender. No distention.  Musculoskeletal: Some chronic contractures which are at baseline.  The patient has a bandage on her right knee which has some dried blood but in general is well-appearing.  She has a postoperative cast on her right lower extremity.  Her toes and part of the foot are exposed and  there is normal capillary refill.  The temperature is the same in that foot as it is on the left foot.. She is able to wiggle the toes without any difficulty.  There is what appears to be some ecchymosis on one area of the big toe but other than that there is no indication of any neurovascular compromise. Neurologic:  Normal speech and language. No gross focal neurologic deficits are appreciated.  Skin:  Skin is warm, dry and intact. No rash noted.   ____________________________________________   LABS (all labs ordered are listed, but only abnormal results are displayed)  Labs Reviewed - No data to display ____________________________________________  EKG  None - EKG not ordered by ED physician ____________________________________________  RADIOLOGY   No results found.  ____________________________________________   PROCEDURES  Procedure(s) performed:   Procedures   Critical Care performed: No ____________________________________________   INITIAL IMPRESSION / ASSESSMENT AND PLAN / ED COURSE  Pertinent labs & imaging results that were available during my care of the patient were reviewed by me and considered in my medical decision making (see chart for details).  The patient appears to be at her baseline based on her multiple visits in the past.  She is in no acute distress and resting comfortably in bed.  There is no evidence of compartment syndrome based on the exposed areas of her foot and leg.  Her vital signs are stable.  She has no hypertension and no tachycardia.  As I documented above, ask her mother how I could help today, and after initially saying "I do not know", I ask again and she said that [name] did not feel like riding in the car to [facility] so she brought her here so that we can transfer her.  I explained that [name]'s vital signs are normal and that while I do agree she needs to be evaluated by her surgeon and other doctors at [facility], I cannot transfer her  because they are closed outside traffic.  We have called [facility] multiple times today and even a patient who is actively having an MI is not being given a bed.  Since she is hemodynamically stable, I encouraged her mother to take her either to her surgeon or to the emergency department but explained that there was little else that we could do in this emergency department.  There is no evidence of neurovascular compromise and the foot with a cast and I explained that I am afraid that I would be doing more harm than good if we remove the cast.  I was told subsequently that the patient's mother became very angry and upset and was cursing and yelling at the nurses and threatening lawsuits if the patient had any episodes of passing out or any other bad outcomes all she was driving her to [facility].  Again, there is no indication of emergent medical condition at this time and I think that private vehicle transport to another facility at their preference is completely appropriate.    ____________________________________________  FINAL CLINICAL IMPRESSION(S) / ED DIAGNOSES  Final diagnoses:  Low back pain without sciatica, unspecified back pain laterality, unspecified chronicity  Post-operative pain     MEDICATIONS GIVEN DURING THIS VISIT:  Medications - No data to display   NEW OUTPATIENT MEDICATIONS STARTED DURING THIS VISIT:  Discharge Medication List as of 09/22/2016  2:51 PM      Discharge Medication List as of 09/22/2016  2:51 PM      Discharge Medication List as of 09/22/2016  2:51 PM       Note:  This document was prepared using Dragon voice recognition software and may include unintentional dictation errors.    Loleta Rose, MD 09/22/16 [phone]

## 2016-09-22 NOTE — Discharge Instructions (Signed)
As we discussed, you have good blood supply to your foot at this point and we believe that the risk of removing your cast would be greater than the risk of following up in clinic or at the [facility] for further evaluation.  Because [facility] is close to outside transfers at this time, it is also not possible for us to send you by ambulance.  We recommend that you go and [facility] for further evaluation by your surgeon as soon as possible.

## 2017-03-21 DIAGNOSIS — H9011 Conductive hearing loss, unilateral, right ear, with unrestricted hearing on the contralateral side: Secondary | ICD-10-CM | POA: Insufficient documentation

## 2017-05-04 ENCOUNTER — Emergency Department: Payer: Medicaid Other

## 2017-05-04 ENCOUNTER — Emergency Department
Admission: EM | Admit: 2017-05-04 | Discharge: 2017-05-04 | Disposition: A | Discharge status: Home / Self Care | Payer: Medicaid Other | Attending: Emergency Medicine | Admitting: Emergency Medicine

## 2017-05-04 ENCOUNTER — Encounter: Payer: Self-pay | Admitting: Emergency Medicine

## 2017-05-04 DIAGNOSIS — N83201 Unspecified ovarian cyst, right side: Secondary | ICD-10-CM | POA: Diagnosis not present

## 2017-05-04 DIAGNOSIS — R102 Pelvic and perineal pain: Secondary | ICD-10-CM

## 2017-05-04 DIAGNOSIS — R112 Nausea with vomiting, unspecified: Secondary | ICD-10-CM

## 2017-05-04 DIAGNOSIS — R1030 Lower abdominal pain, unspecified: Secondary | ICD-10-CM | POA: Diagnosis present

## 2017-05-04 LAB — CBC WITH DIFFERENTIAL/PLATELET
BASOS ABS: 0 10*3/uL (ref 0–0.1)
Basophils Relative: 0 %
Eosinophils Absolute: 0 10*3/uL (ref 0–0.7)
Eosinophils Relative: 0 %
HEMATOCRIT: 40.9 % (ref 35.0–47.0)
HEMOGLOBIN: 14.2 g/dL (ref 12.0–16.0)
LYMPHS PCT: 8 %
Lymphs Abs: 1.2 10*3/uL (ref 1.0–3.6)
MCH: 29.9 pg (ref 26.0–34.0)
MCHC: 34.7 g/dL (ref 32.0–36.0)
MCV: 86.3 fL (ref 80.0–100.0)
MONO ABS: 0.7 10*3/uL (ref 0.2–0.9)
MONOS PCT: 5 %
NEUTROS ABS: 12.8 10*3/uL — AB (ref 1.4–6.5)
NEUTROS PCT: 87 %
Platelets: 132 10*3/uL — ABNORMAL LOW (ref 150–440)
RBC: 4.74 MIL/uL (ref 3.80–5.20)
RDW: 12.9 % (ref 11.5–14.5)
WBC: 14.7 10*3/uL — ABNORMAL HIGH (ref 3.6–11.0)

## 2017-05-04 LAB — COMPREHENSIVE METABOLIC PANEL
ALK PHOS: 66 U/L (ref 38–126)
ALT: 40 U/L (ref 14–54)
AST: 60 U/L — AB (ref 15–41)
Albumin: 4.3 g/dL (ref 3.5–5.0)
Anion gap: 7 (ref 5–15)
BILIRUBIN TOTAL: 0.6 mg/dL (ref 0.3–1.2)
BUN: 19 mg/dL (ref 6–20)
CALCIUM: 9.1 mg/dL (ref 8.9–10.3)
CO2: 27 mmol/L (ref 22–32)
Chloride: 104 mmol/L (ref 101–111)
Creatinine, Ser: 0.95 mg/dL (ref 0.44–1.00)
GFR calc Af Amer: >60 mL/min (ref >60)
GLUCOSE: 140 mg/dL — AB (ref 65–99)
POTASSIUM: 3.3 mmol/L — AB (ref 3.5–5.1)
Sodium: 138 mmol/L (ref 135–145)
TOTAL PROTEIN: 7.1 g/dL (ref 6.5–8.1)

## 2017-05-04 LAB — LIPASE, BLOOD: LIPASE: 19 U/L (ref 11–51)

## 2017-05-04 MED ORDER — PROMETHAZINE HCL 25 MG/ML IJ SOLN: 25.0000 mg | Freq: Once | INTRAMUSCULAR | Status: DC

## 2017-05-04 MED ORDER — SODIUM CHLORIDE 0.9 % IV SOLN
Freq: Once | INTRAVENOUS | Status: AC
  Administered 2017-05-04: 08:00:00 via INTRAVENOUS

## 2017-05-04 MED ORDER — IOPAMIDOL (ISOVUE-300) INJECTION 61%
75.0000 mL | Freq: Once | INTRAVENOUS | Status: AC | PRN
  Administered 2017-05-04: 75 mL via INTRAVENOUS

## 2017-05-04 MED ORDER — ONDANSETRON HCL 4 MG/2ML IJ SOLN: 4.0000 mg | Freq: Once | INTRAMUSCULAR | Status: DC

## 2017-05-04 MED ORDER — PROMETHAZINE HCL 25 MG PO TABS: 25.0000 mg | ORAL_TABLET | Freq: Four times a day (QID) | ORAL | 0 refills | Status: DC | PRN

## 2017-05-04 NOTE — ED Provider Notes (Signed)
[facility] Emergency Department Provider Note       Time seen: ----------------------------------------- 7:41 AM on 05/04/2017 -----------------------------------------     I have reviewed the triage vital signs and the nursing notes.   HISTORY   Chief Complaint Abdominal Pain    HPI [name] is a 36 y.o. female who presents to the ED for abdominal pain and vomiting. Patient reports pain in the lower abdomen as well as nausea and vomiting. EMS was called to their house for nausea. She did not start to vomit until in route by EMS. Patient states she has a history of ovarian cysts. She denies fevers, chills or other complaints. Onset of symptoms was this morning.   Past Medical History:  Diagnosis Date  . Carpal tunnel syndrome   . Cerebral palsy (HCC)   . Chronic back pain   . Gitelman syndrome   . Hypokalemia    now resolved  . Magnesium deficiency   . Pacemaker   . PONV (postoperative nausea and vomiting)   . 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  . RSD (reflex sympathetic dystrophy)    right foot,   . RSD (reflex sympathetic dystrophy)   . SA node dysfunction (HCC) 2008    Patient Active Problem List   Diagnosis Date Noted  . Right ovarian cyst 05/31/2015  . Abdominal pain, chronic, right lower quadrant 05/23/2015  . Hypomagnesemia 03/02/2014  . Back pain 03/02/2014    Past Surgical History:  Procedure Laterality Date  . ABDOMINAL HYSTERECTOMY Left 2013   Dr. Tiburcio Pea  . INSERT / REPLACE / REMOVE PACEMAKER    . ORTHOPEDIC SURGERY    . PACEMAKER INSERTION    . PORTACATH PLACEMENT      Allergies Cymbalta [duloxetine hcl]; Gabapentin; Prozac [fluoxetine hcl]; and Sulfa antibiotics  Social History Social History  Substance Use Topics  . Smoking status: Never Smoker  . Smokeless tobacco: Never Used  . Alcohol use No    Review of Systems Constitutional: Negative for fever. Eyes:  Negative for vision changes ENT:  Negative for congestion, sore throat Cardiovascular: Negative for chest pain. Respiratory: Negative for shortness of breath. Gastrointestinal: Positive for abdominal pain, vomiting Genitourinary: Negative for dysuria. Musculoskeletal: Negative for back pain. Skin: Negative for rash. Neurological: Negative for headaches, focal weakness or numbness.  All systems negative/normal/unremarkable except as stated in the HPI  ____________________________________________   PHYSICAL EXAM:  VITAL SIGNS: ED Triage Vitals  Enc Vitals Group     BP --      Pulse --      Resp --      Temp --      Temp src --      SpO2 05/04/17 0738 97 %     Weight 05/04/17 0739 90 lb (40.8 kg)     Height --      Head Circumference --      Peak Flow --      Pain Score --      Pain Loc --      Pain Edu? --      Excl. in GC? --     Constitutional: Alert and oriented. Well appearing and in no distress. Eyes: Conjunctivae are normal. Normal extraocular movements. ENT   Head: Normocephalic and atraumatic.   Nose: No congestion/rhinnorhea.   Mouth/Throat: Mucous membranes are moist.   Neck: No stridor. Cardiovascular: Normal rate, regular rhythm. No murmurs, rubs, or gallops. Respiratory: Normal respiratory effort without tachypnea nor  retractions. Breath sounds are clear and equal bilaterally. No wheezes/rales/rhonchi. Gastrointestinal: Lower abdominal tenderness, no rebound or guarding. Normal bowel sounds. Musculoskeletal: Nontender with normal range of motion in extremities. No lower extremity tenderness nor edema. Neurologic:  Normal speech and language. No gross focal neurologic deficits are appreciated.  Skin:  Skin is warm, dry and intact. No rash noted. Psychiatric: Speech and behavior are at her baseline ____________________________________________  ED COURSE:  Pertinent labs & imaging results that were available during my care of the patient were  reviewed by me and considered in my medical decision making (see chart for details). Patient presents for abdominal pain and vomiting, we will assess with labs and imaging as indicated.   Procedures ____________________________________________   LABS (pertinent positives/negatives)  Labs Reviewed  CBC WITH DIFFERENTIAL/PLATELET - Abnormal; Notable for the following:       Result Value   WBC 14.7 (*)    Platelets 132 (*)    Neutro Abs 12.8 (*)    All other components within normal limits  COMPREHENSIVE METABOLIC PANEL - Abnormal; Notable for the following:    Potassium 3.3 (*)    Glucose, Bld 140 (*)    AST 60 (*)    All other components within normal limits  LIPASE, BLOOD  URINALYSIS, COMPLETE (UACMP) WITH MICROSCOPIC    RADIOLOGY  CT of the abdomen and pelvis with contrast  IMPRESSION: 1. No evidence of bowel obstruction or acute bowel inflammation. 2. Mild patchy right middle lobe ground-glass opacity, probably inflammatory and of doubtful clinical significance. 3. Indeterminate 1.6 cm low-attenuation interpolar right renal cortical mass, minimally increased in size since 02/13/2014 CT study. MRI abdomen without and with IV contrast is recommended for further characterization. If the patient is not a candidate for MRI, CT abdomen without and with IV contrast is recommended. 4. Multilocular 4.9 cm right adnexal cystic mass, similar to a multilocular right adnexal cystic mass seen on 02/13/2014 CT study, suggesting a benign lesion. Recommend short-term outpatient transabdominal and transvaginal pelvic ultrasound follow-up. IMPRESSION: 1. No evidence of right adnexal torsion. 2. Two dominant subjacent simple right ovarian cysts measuring 3.1 cm and 3.6 cm, not appreciably changed since 04/18/2014 pelvic sonogram and considered benign. 3. Status post left oophorectomy.  No left adnexal mass. 4. Status post hysterectomy. No abnormal findings at the hysterectomy margin. 5.  No free fluid the pelvis. ____________________________________________  FINAL ASSESSMENT AND PLAN  Abdominal pain, vomiting, Ovarian cyst  Plan: Patient's labs and imaging were dictated above. Patient had presented for abdominal pain and vomiting. There are no signs of ovarian torsion but she does have right ovarian cyst which could be causing some of her pain. Otherwise her symptoms are likely viral. She'll be discharged antiemetics and she is stable for outpatient follow-up.   Evolet FilbertWilliams, Ryanne Morand E, MD   Note: This note was generated in part or whole with voice recognition software. Voice recognition is usually quite accurate but there are transcription errors that can and very often do occur. I apologize for any typographical errors that were not detected and corrected.     Rechel FilbertWilliams, Derika Eckles E, MD 05/04/17 1300

## 2017-05-04 NOTE — ED Notes (Signed)
NAD noted at time of D/C. Pt taken to the lobby via wheelchair to be taken home by her mother via POV. Pt's mother denies comments/concerns at this time.

## 2017-05-04 NOTE — ED Notes (Signed)
Pt given cool wash cloths per request, pt's mother given warm blanket, pt repositioned in bed.

## 2017-05-04 NOTE — ED Notes (Signed)
Pt returned from US and CT at this time.

## 2017-05-04 NOTE — ED Notes (Signed)
This RN to bedside due to call light. Pt visualized in NAD, resting on R side at this time. Pt's mom remains at bedside, pt asking about results of US. This RN explained and apologized for delay, explaining that multiple critical patients at this time. Pt states understanding. Will continue to monitor for further patient needs.

## 2017-05-04 NOTE — ED Notes (Signed)
This RN to bedside, pt c/o feeling like she is going to throw up. This RN offered once again for patient to get nausea medication, pt refused asking, "can I have a bag of fluid?" This RN explained to patient would have to ask MD, pt nods understanding. Pt's mom at bedside. Pt given a cool wash cloth per her request. Will continue to monitor for further patient needs.

## 2017-05-04 NOTE — ED Triage Notes (Signed)
Pt presents to ED via [facility] with c/o N/V and lower abdominal pain. Per EMS pt presents from home, vomited 2 times en route but no other times, no blood noted in vomit. Per EMS [facility] and dx with low magnesium levels, EMS states that pain started last night after she got home.

## 2017-05-04 NOTE — ED Notes (Signed)
Pt refused phenergan injection at this time.

## 2017-05-04 NOTE — ED Notes (Signed)
Patient transported to CT 

## 2017-06-01 ENCOUNTER — Emergency Department: Payer: Medicaid Other

## 2017-06-01 ENCOUNTER — Encounter: Payer: Self-pay | Admitting: Emergency Medicine

## 2017-06-01 ENCOUNTER — Emergency Department
Admission: EM | Admit: 2017-06-01 | Discharge: 2017-06-01 | Disposition: A | Discharge status: Home / Self Care | Payer: Medicaid Other | Attending: Emergency Medicine | Admitting: Emergency Medicine

## 2017-06-01 DIAGNOSIS — Z79899 Other long term (current) drug therapy: Secondary | ICD-10-CM | POA: Diagnosis not present

## 2017-06-01 DIAGNOSIS — Z950 Presence of cardiac pacemaker: Secondary | ICD-10-CM | POA: Insufficient documentation

## 2017-06-01 DIAGNOSIS — G809 Cerebral palsy, unspecified: Secondary | ICD-10-CM | POA: Diagnosis not present

## 2017-06-01 DIAGNOSIS — R079 Chest pain, unspecified: Secondary | ICD-10-CM | POA: Insufficient documentation

## 2017-06-01 DIAGNOSIS — K219 Gastro-esophageal reflux disease without esophagitis: Secondary | ICD-10-CM | POA: Diagnosis not present

## 2017-06-01 LAB — BASIC METABOLIC PANEL
Anion gap: 7 (ref 5–15)
BUN: 14 mg/dL (ref 6–20)
CALCIUM: 8.9 mg/dL (ref 8.9–10.3)
CO2: 28 mmol/L (ref 22–32)
Chloride: 105 mmol/L (ref 101–111)
Creatinine, Ser: 0.69 mg/dL (ref 0.44–1.00)
GFR calc Af Amer: >60 mL/min (ref >60)
GLUCOSE: 113 mg/dL — AB (ref 65–99)
Potassium: 3.4 mmol/L — ABNORMAL LOW (ref 3.5–5.1)
SODIUM: 140 mmol/L (ref 135–145)

## 2017-06-01 LAB — CBC
HCT: 36.8 % (ref 35.0–47.0)
HEMOGLOBIN: 13 g/dL (ref 12.0–16.0)
MCH: 30.5 pg (ref 26.0–34.0)
MCHC: 35.4 g/dL (ref 32.0–36.0)
MCV: 86.3 fL (ref 80.0–100.0)
Platelets: 115 10*3/uL — ABNORMAL LOW (ref 150–440)
RBC: 4.27 MIL/uL (ref 3.80–5.20)
RDW: 13 % (ref 11.5–14.5)
WBC: 5.2 10*3/uL (ref 3.6–11.0)

## 2017-06-01 LAB — TROPONIN I

## 2017-06-01 MED ORDER — FAMOTIDINE 20 MG PO TABS: 20.0000 mg | ORAL_TABLET | Freq: Two times a day (BID) | ORAL | 0 refills | Status: DC

## 2017-06-01 MED ORDER — CALCIUM CARBONATE ANTACID 500 MG PO CHEW: 2.0000 | CHEWABLE_TABLET | Freq: Three times a day (TID) | ORAL | 0 refills | Status: DC | PRN

## 2017-06-01 NOTE — ED Notes (Signed)
Patient transported to X-ray 

## 2017-06-01 NOTE — ED Notes (Signed)

## 2017-06-01 NOTE — Discharge Instructions (Signed)
Results for orders placed or performed during the hospital encounter of 06/01/17  Basic metabolic panel  Result Value Ref Range   Sodium 140 135 - 145 mmol/L   Potassium 3.4 (L) 3.5 - 5.1 mmol/L   Chloride 105 101 - 111 mmol/L   CO2 28 22 - 32 mmol/L   Glucose, Bld 113 (H) 65 - 99 mg/dL   BUN 14 6 - 20 mg/dL   Creatinine, Ser 1.61 0.44 - 1.00 mg/dL   Calcium 8.9 8.9 - 09.6 mg/dL   GFR calc non Af Amer >60 >60 mL/min   GFR calc Af Amer >60 >60 mL/min   Anion gap 7 5 - 15  CBC  Result Value Ref Range   WBC 5.2 3.6 - 11.0 K/uL   RBC 4.27 3.80 - 5.20 MIL/uL   Hemoglobin 13.0 12.0 - 16.0 g/dL   HCT 04.5 40.9 - 81.1 %   MCV 86.3 80.0 - 100.0 fL   MCH 30.5 26.0 - 34.0 pg   MCHC 35.4 32.0 - 36.0 g/dL   RDW 91.4 78.2 - 95.6 %   Platelets 115 (L) 150 - 440 K/uL  Troponin I  Result Value Ref Range   Troponin I <0.03 <0.03 ng/mL   US Transvaginal Non-ob  Result Date: 05/04/2017 CLINICAL DATA:  36 year old female with a history of hysterectomy and left oophorectomy presents with right pelvic pain and vomiting since 4 a.m. this morning. Right adnexal cystic mass on CT study from earlier today. EXAM: TRANSABDOMINAL AND TRANSVAGINAL ULTRASOUND OF PELVIS DOPPLER ULTRASOUND OF OVARIES TECHNIQUE: Both transabdominal and transvaginal ultrasound examinations of the pelvis were performed. Transabdominal technique was performed for global imaging of the pelvis including hysterectomy margin, adnexal regions and pelvic cul-de-sac. It was necessary to proceed with endovaginal exam following the transabdominal exam to visualize the hysterectomy margin and adnexa. Color and duplex Doppler ultrasound was utilized to evaluate blood flow to the ovaries. COMPARISON:  05/04/2017 CT abdomen/ pelvis. 04/18/2014 pelvic sonogram. FINDINGS: Uterus Status post hysterectomy. No mass or fluid collection demonstrated at the hysterectomy margin. Right ovary Measurements: 5.8 x 3.6 x 4.6 cm. There are two dominant subjacent  simple right ovarian cysts measuring 2.7 x 2.6 x 3.1 cm and 3.6 x 2.1 x 3.1 cm, respectively. Previously, these subjacent simple right ovarian cysts measured 3.2 x 2.1 x 2.1 cm and 3.4 x 2.4 x 2.3 cm on 04/18/2014 pelvic sonogram. No significant interval change. No mural nodules, wall thickening or other internal complexity. Left ovary Surgically absent left ovary.  No left adnexal mass demonstrated. Pulsed Doppler evaluation of the right ovary demonstrates normal low-resistance arterial and venous waveforms. Other findings No abnormal free fluid. IMPRESSION: 1. No evidence of right adnexal torsion. 2. Two dominant subjacent simple right ovarian cysts measuring 3.1 cm and 3.6 cm, not appreciably changed since 04/18/2014 pelvic sonogram and considered benign. 3. Status post left oophorectomy.  No left adnexal mass. 4. Status post hysterectomy. No abnormal findings at the hysterectomy margin. 5. No free fluid the pelvis. Electronically Signed   By: Delbert Phenix [name]   On: 05/04/2017 11:49   US Pelvis Complete  Result Date: 05/04/2017 CLINICAL DATA:  36 year old female with a history of hysterectomy and left oophorectomy presents with right pelvic pain and vomiting since 4 a.m. this morning. Right adnexal cystic mass on CT study from earlier today. EXAM: TRANSABDOMINAL AND TRANSVAGINAL ULTRASOUND OF PELVIS DOPPLER ULTRASOUND OF OVARIES TECHNIQUE: Both transabdominal and transvaginal ultrasound examinations of the pelvis were performed. Transabdominal technique was  performed for global imaging of the pelvis including hysterectomy margin, adnexal regions and pelvic cul-de-sac. It was necessary to proceed with endovaginal exam following the transabdominal exam to visualize the hysterectomy margin and adnexa. Color and duplex Doppler ultrasound was utilized to evaluate blood flow to the ovaries. COMPARISON:  05/04/2017 CT abdomen/ pelvis. 04/18/2014 pelvic sonogram. FINDINGS: Uterus Status post hysterectomy. No mass or  fluid collection demonstrated at the hysterectomy margin. Right ovary Measurements: 5.8 x 3.6 x 4.6 cm. There are two dominant subjacent simple right ovarian cysts measuring 2.7 x 2.6 x 3.1 cm and 3.6 x 2.1 x 3.1 cm, respectively. Previously, these subjacent simple right ovarian cysts measured 3.2 x 2.1 x 2.1 cm and 3.4 x 2.4 x 2.3 cm on 04/18/2014 pelvic sonogram. No significant interval change. No mural nodules, wall thickening or other internal complexity. Left ovary Surgically absent left ovary.  No left adnexal mass demonstrated. Pulsed Doppler evaluation of the right ovary demonstrates normal low-resistance arterial and venous waveforms. Other findings No abnormal free fluid. IMPRESSION: 1. No evidence of right adnexal torsion. 2. Two dominant subjacent simple right ovarian cysts measuring 3.1 cm and 3.6 cm, not appreciably changed since 04/18/2014 pelvic sonogram and considered benign. 3. Status post left oophorectomy.  No left adnexal mass. 4. Status post hysterectomy. No abnormal findings at the hysterectomy margin. 5. No free fluid the pelvis. Electronically Signed   By: Delbert PhenixJason A Poff [name]   On: 05/04/2017 11:49   Ct Abdomen Pelvis W Contrast  Result Date: 05/04/2017 CLINICAL DATA:  Cerebral palsy. Diffuse abdominal pain and vomiting. Prior hysterectomy. EXAM: CT ABDOMEN AND PELVIS WITH CONTRAST TECHNIQUE: Multidetector CT imaging of the abdomen and pelvis was performed using the standard protocol following bolus administration of intravenous contrast. CONTRAST:  75mL ISOVUE-300 IOPAMIDOL (ISOVUE-300) INJECTION 61% COMPARISON:  02/13/2014 CT abdomen/ pelvis. FINDINGS: Lower chest: Mild patchy ground-glass opacity in the dependent medial right middle lobe. Stable discontinuities in the lower most sternotomy wire. Partially visualized 2 lead epicardial pacemaker in the ventral right upper abdomen with the lead tips not seen on the CT images, although seen overlying the right pericardial space on the scout  topogram. Hepatobiliary: Normal liver with no liver mass. Distended gallbladder. No evidence of gallbladder wall thickening, radiopaque cholelithiasis or pericholecystic fluid, noting limited gallbladder visualization due to streak artifact from the pacemaker device. No biliary ductal dilatation. Pancreas: Stable appearance of the normal appearing pancreatic head and neck with apparent atrophy or absence of the pancreatic body and tail. No pancreatic mass or duct dilation. Spleen: Normal size. No mass. Adrenals/Urinary Tract: Normal adrenals. Hypodense 1.6 cm renal cortical lesion in the interpolar right kidney (series 2/ image 32) with indeterminate density 51 HU, minimally increased from 1.4 cm on 02/13/2014. Numerous additional subcentimeter hypodense renal cortical lesions in both kidneys are too small to characterize. No hydronephrosis. Normal bladder. Stomach/Bowel: Grossly normal stomach. Normal caliber small bowel with no small bowel wall thickening. Appendix appears normal. Normal large bowel with no diverticulosis, large bowel wall thickening or pericolonic fat stranding. Vascular/Lymphatic: Normal caliber abdominal aorta. Patent portal, splenic, hepatic and renal veins. No pathologically enlarged lymph nodes in the abdomen or pelvis. Reproductive: Status post hysterectomy. Stable appearance of the hysterectomy margin since 02/13/2014 CT study with mild soft tissue prominence at the hysterectomy margin probably representing remnant cervical tissue. Multilocular 4.9 x 3.5 cm right adnexal cystic mass (series 2/ image 65), similar to a 5.1 x 4.1 cm multilocular right adnexal cystic mass on 02/13/2014 CT study. No  left adnexal mass. Other: No pneumoperitoneum, ascites or focal fluid collection. Musculoskeletal: No aggressive appearing focal osseous lesions. Mild thoracolumbar spondylosis. IMPRESSION: 1. No evidence of bowel obstruction or acute bowel inflammation. 2. Mild patchy right middle lobe ground-glass  opacity, probably inflammatory and of doubtful clinical significance. 3. Indeterminate 1.6 cm low-attenuation interpolar right renal cortical mass, minimally increased in size since 02/13/2014 CT study. MRI abdomen without and with IV contrast is recommended for further characterization. If the patient is not a candidate for MRI, CT abdomen without and with IV contrast is recommended. 4. Multilocular 4.9 cm right adnexal cystic mass, similar to a multilocular right adnexal cystic mass seen on 02/13/2014 CT study, suggesting a benign lesion. Recommend short-term outpatient transabdominal and transvaginal pelvic ultrasound follow-up. Electronically Signed   By: Delbert Phenix [name]   On: 05/04/2017 10:12   [place] Art/ven Flow Abd Pelv Doppler  Result Date: 05/04/2017 CLINICAL DATA:  36 year old female with a history of hysterectomy and left oophorectomy presents with right pelvic pain and vomiting since 4 a.m. this morning. Right adnexal cystic mass on CT study from earlier today. EXAM: TRANSABDOMINAL AND TRANSVAGINAL ULTRASOUND OF PELVIS DOPPLER ULTRASOUND OF OVARIES TECHNIQUE: Both transabdominal and transvaginal ultrasound examinations of the pelvis were performed. Transabdominal technique was performed for global imaging of the pelvis including hysterectomy margin, adnexal regions and pelvic cul-de-sac. It was necessary to proceed with endovaginal exam following the transabdominal exam to visualize the hysterectomy margin and adnexa. Color and duplex Doppler ultrasound was utilized to evaluate blood flow to the ovaries. COMPARISON:  05/04/2017 CT abdomen/ pelvis. 04/18/2014 pelvic sonogram. FINDINGS: Uterus Status post hysterectomy. No mass or fluid collection demonstrated at the hysterectomy margin. Right ovary Measurements: 5.8 x 3.6 x 4.6 cm. There are two dominant subjacent simple right ovarian cysts measuring 2.7 x 2.6 x 3.1 cm and 3.6 x 2.1 x 3.1 cm, respectively. Previously, these subjacent simple right  ovarian cysts measured 3.2 x 2.1 x 2.1 cm and 3.4 x 2.4 x 2.3 cm on 04/18/2014 pelvic sonogram. No significant interval change. No mural nodules, wall thickening or other internal complexity. Left ovary Surgically absent left ovary.  No left adnexal mass demonstrated. Pulsed Doppler evaluation of the right ovary demonstrates normal low-resistance arterial and venous waveforms. Other findings No abnormal free fluid. IMPRESSION: 1. No evidence of right adnexal torsion. 2. Two dominant subjacent simple right ovarian cysts measuring 3.1 cm and 3.6 cm, not appreciably changed since 04/18/2014 pelvic sonogram and considered benign. 3. Status post left oophorectomy.  No left adnexal mass. 4. Status post hysterectomy. No abnormal findings at the hysterectomy margin. 5. No free fluid the pelvis. Electronically Signed   By: Delbert Phenix [name]   On: 05/04/2017 11:49

## 2017-06-01 NOTE — ED Provider Notes (Addendum)
[facility] Emergency Department Provider Note  ____________________________________________  Time seen: Approximately 5:54 AM  I have reviewed the triage vital signs and the nursing notes.   HISTORY  Chief Complaint Chest Pain   HPI [name] is a 36 y.o. female who awoke this morning at 3:30 AM with what she describes as chest pain. She indicates the upper abdomen as the source of the chest pain. Radiates up into the chest. Doesn't go to her back or jaw or right arm. No diaphoresis vomiting or shortness of breath. Not exertional, not pleuritic. No aggravating or alleviating factors. Feels like burning.    Past Medical History:  Diagnosis Date  . Carpal tunnel syndrome   . Cerebral palsy (HCC)   . Chronic back pain   . Gitelman syndrome   . Hypokalemia    now resolved  . Magnesium deficiency   . Pacemaker   . PONV (postoperative nausea and vomiting)   . 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  . RSD (reflex sympathetic dystrophy)    right foot,   . RSD (reflex sympathetic dystrophy)   . SA node dysfunction (HCC) 2008     Patient Active Problem List   Diagnosis Date Noted  . Right ovarian cyst 05/31/2015  . Abdominal pain, chronic, right lower quadrant 05/23/2015  . Hypomagnesemia 03/02/2014  . Back pain 03/02/2014     Past Surgical History:  Procedure Laterality Date  . ABDOMINAL HYSTERECTOMY Left 2013   [name]  . INSERT / REPLACE / REMOVE PACEMAKER    . ORTHOPEDIC SURGERY    . PACEMAKER INSERTION    . PORTACATH PLACEMENT       Prior to Admission medications   Medication Sig Start Date End Date Taking? Authorizing Provider  methylphenidate (RITALIN) 20 MG tablet Take 20 mg by mouth 3 (three) times daily.    Yes [provider]  naproxen sodium (ANAPROX) 220 MG tablet Take 220 mg by mouth 2 (two) times daily with a meal.   Yes [provider]  pregabalin (LYRICA) 75 MG  capsule Take 75 mg by mouth 3 (three) times daily.    Yes [provider]  calcium carbonate (TUMS) 500 MG chewable tablet Chew 2 tablets (400 mg of elemental calcium total) by mouth 3 (three) times daily as needed for indigestion or heartburn. 06/01/17 06/01/18  Sharman CheekStafford, Ladena Jacquez, MD  erythromycin ophthalmic ointment Place a 1/2 inch ribbon of ointment into the lower eyelid. Patient not taking: Reported on 05/04/2017 06/15/16   Lutricia Feiloemer, William P, PA-C  famotidine (PEPCID) 20 MG tablet Take 1 tablet (20 mg total) by mouth 2 (two) times daily. 06/01/17   Sharman CheekStafford, Juquan Reznick, MD  hydrocortisone valerate ointment (WESTCORT) 0.2 % Apply 1 application topically 2 (two) times daily. Patient not taking: Reported on 09/17/2015 08/16/15   Eustace MooreMurray, Laura W, MD  magnesium sulfate in dextrose 5 % 250 mL Inject into the vein once a week.    [provider]  mupirocin ointment (BACTROBAN) 2 % Apply 1 application topically 2 (two) times daily. To sore patches of leg rash Patient not taking: Reported on 09/17/2015 08/16/15   Eustace MooreMurray, Laura W, MD  naproxen (NAPROSYN) 500 MG tablet Take 1 tablet (500 mg total) by mouth 2 (two) times daily with a meal. Patient not taking: Reported on 09/17/2015 08/28/15   Sharman CheekStafford, Kymoni Lesperance, MD  penicillin v potassium (VEETID) 500 MG tablet Take 1 tablet (500 mg total) by mouth 4 (four) times daily.  Patient not taking: Reported on 05/04/2017 05/28/16   Evon Slack, PA-C  promethazine (PHENERGAN) 25 MG tablet Take 1 tablet (25 mg total) by mouth every 6 (six) hours as needed for nausea or vomiting. 05/04/17   Shaconda Filbert, MD     Allergies Cymbalta [duloxetine hcl]; Gabapentin; Prozac [fluoxetine hcl]; and Sulfa antibiotics   Family History  Problem Relation Age of Onset  . Diabetes Mother   . Hypertension Mother     Social History Social History  Substance Use Topics  . Smoking status: Never Smoker  . Smokeless tobacco: Never Used  . Alcohol use No     Review of Systems  Constitutional:   No fever or chills.  ENT:   No sore throat. No rhinorrhea. Cardiovascular:   No chest pain or syncope. Respiratory:   No dyspnea or cough. Gastrointestinal:  Upper abdominal pain as above without vomiting or diarrhea Musculoskeletal:   Negative for focal pain or swelling All other systems reviewed and are negative except as documented above in ROS and HPI.  ____________________________________________   PHYSICAL EXAM:  VITAL SIGNS: ED Triage Vitals  Enc Vitals Group     BP 06/01/17 0518 (!) 133/91     Pulse Rate 06/01/17 0518 85     Resp 06/01/17 0518 16     Temp 06/01/17 0518 98.4 F (36.9 C)     Temp Source 06/01/17 0518 Oral     SpO2 06/01/17 0516 100 %     Weight 06/01/17 0519 95 lb (43.1 kg)     Height 06/01/17 0519 5\' 2"  (1.575 m)     Head Circumference --      Peak Flow --      Pain Score 06/01/17 0518 7     Pain Loc --      Pain Edu? --      Excl. in GC? --     Vital signs reviewed, nursing assessments reviewed.   Constitutional:   Alert and oriented. Not in distress Eyes:   No scleral icterus.  EOMI. No nystagmus. No conjunctival pallor. PERRL. ENT   Head:   Normocephalic and atraumatic.   Nose:   No congestion/rhinnorhea.    Mouth/Throat:   MMM, no pharyngeal erythema. No peritonsillar mass.    Neck:   No meningismus. Full ROM Hematological/Lymphatic/Immunilogical:   No cervical lymphadenopathy. Cardiovascular:   RRR. Symmetric bilateral radial and DP pulses.  No murmurs.  Respiratory:   Normal respiratory effort without tachypnea/retractions. Breath sounds are clear and equal bilaterally. No wheezes/rales/rhonchi. Gastrointestinal:   Soft with left upper quadrant tenderness reproducing her pain. Non distended. There is no CVA tenderness.  No rebound, rigidity, or guarding. Genitourinary:   deferred Musculoskeletal:   Normal range of motion in all extremities. No joint effusions.  No lower extremity  tenderness.  No edema. Neurologic:   Normal speech and language.  Motor grossly intact. No gross focal neurologic deficits are appreciated.  Skin:    Skin is warm, dry and intact. No rash noted.  No petechiae, purpura, or bullae.  ____________________________________________    LABS (pertinent positives/negatives) (all labs ordered are listed, but only abnormal results are displayed) Labs Reviewed  BASIC METABOLIC PANEL - Abnormal; Notable for the following:       Result Value   Potassium 3.4 (*)    Glucose, Bld 113 (*)    All other components within normal limits  CBC - Abnormal; Notable for the following:    Platelets 115 (*)  All other components within normal limits  TROPONIN I   ____________________________________________   EKG  Interpreted by me Atrial paced rhythm. Rate of 85. Right axis. Normal QRS ST segments and T waves.  ____________________________________________    RADIOLOGY  No results found.  ____________________________________________   PROCEDURES Procedures  ____________________________________________   INITIAL IMPRESSION / ASSESSMENT AND PLAN / ED COURSE  Pertinent labs & imaging results that were available during my care of the patient were reviewed by me and considered in my medical decision making (see chart for details).  Patient well appearing no acute distress, presents with upper abdominal pain radiating into the chest, consistent with acid reflux. Exam does not reveal any other concerns. Vital signs unremarkable. I highly doubt ACS PE dissection and pneumothorax pericarditis. No suspicion of any acute intra-abdominal pathology such as perforation obstruction AAA biliary pathology. In the past month patient has had multiple ultrasounds of the pelvis and CT scans of the abdomen. All unremarkable.  We'll check labs, give antacids. Anticipated discharge home.      ____________________________________________   FINAL CLINICAL  IMPRESSION(S) / ED DIAGNOSES  Final diagnoses:  Nonspecific chest pain  Gastroesophageal reflux disease, esophagitis presence not specified      New Prescriptions   CALCIUM CARBONATE (TUMS) 500 MG CHEWABLE TABLET    Chew 2 tablets (400 mg of elemental calcium total) by mouth 3 (three) times daily as needed for indigestion or heartburn.   FAMOTIDINE (PEPCID) 20 MG TABLET    Take 1 tablet (20 mg total) by mouth 2 (two) times daily.     Portions of this note were generated with dragon dictation software. Dictation errors may occur despite best attempts at proofreading.    Sharman Cheek, MD 06/01/17 Bethann Berkshire    Sharman Cheek, MD 06/01/17 [phone]

## 2017-06-01 NOTE — ED Triage Notes (Signed)
Pt presents to ED vai [facility] from home c/o chest pain radiating to left arm. Pt reports intermittent pain for over a month but has progressively become worse since yesterday and began again at 3 am this morning. [facility]. Pt was given 324 Aspirin by EMS which pt states helped a little but pain is back to a 7/10. Per EMS pt took phenergan prior to their arrival.  Pt has pacemaker. Pt also c/o upper abd pain, states had lower abd pain/pelvic pain yesterday and dx with cyst. Pt states pain is now in upper abd. Pt alert and oriented x 4, respirations even and unlabored, skin warm and dry. Family at bedside, call bell within reach.

## 2017-07-10 ENCOUNTER — Emergency Department (HOSPITAL_COMMUNITY): Payer: Medicaid Other

## 2017-07-10 ENCOUNTER — Encounter (HOSPITAL_COMMUNITY): Payer: Self-pay | Admitting: Emergency Medicine

## 2017-07-10 ENCOUNTER — Emergency Department (HOSPITAL_COMMUNITY)
Admission: EM | Admit: 2017-07-10 | Discharge: 2017-07-10 | Disposition: A | Discharge status: Home / Self Care | Payer: Medicaid Other | Attending: Emergency Medicine | Admitting: Emergency Medicine

## 2017-07-10 DIAGNOSIS — R079 Chest pain, unspecified: Secondary | ICD-10-CM

## 2017-07-10 DIAGNOSIS — Z950 Presence of cardiac pacemaker: Secondary | ICD-10-CM | POA: Insufficient documentation

## 2017-07-10 DIAGNOSIS — Z79899 Other long term (current) drug therapy: Secondary | ICD-10-CM | POA: Diagnosis not present

## 2017-07-10 DIAGNOSIS — R0789 Other chest pain: Secondary | ICD-10-CM | POA: Diagnosis present

## 2017-07-10 LAB — BASIC METABOLIC PANEL
ANION GAP: 13 (ref 5–15)
BUN: 14 mg/dL (ref 6–20)
CALCIUM: 9.4 mg/dL (ref 8.9–10.3)
CO2: 24 mmol/L (ref 22–32)
Chloride: 102 mmol/L (ref 101–111)
Creatinine, Ser: 0.87 mg/dL (ref 0.44–1.00)
Glucose, Bld: 71 mg/dL (ref 65–99)
POTASSIUM: 3.3 mmol/L — AB (ref 3.5–5.1)
Sodium: 139 mmol/L (ref 135–145)

## 2017-07-10 LAB — URINALYSIS, ROUTINE W REFLEX MICROSCOPIC
Bilirubin Urine: NEGATIVE
Glucose, UA: NEGATIVE mg/dL
HGB URINE DIPSTICK: NEGATIVE
Ketones, ur: 20 mg/dL — AB
LEUKOCYTES UA: NEGATIVE
NITRITE: NEGATIVE
PROTEIN: NEGATIVE mg/dL
Specific Gravity, Urine: 1.023 (ref 1.005–1.030)
pH: 5 (ref 5.0–8.0)

## 2017-07-10 LAB — HEPATIC FUNCTION PANEL
ALBUMIN: 4.2 g/dL (ref 3.5–5.0)
ALT: 16 U/L (ref 14–54)
AST: 24 U/L (ref 15–41)
Alkaline Phosphatase: 50 U/L (ref 38–126)
BILIRUBIN TOTAL: 0.9 mg/dL (ref 0.3–1.2)
Bilirubin, Direct: 0.2 mg/dL (ref 0.1–0.5)
Indirect Bilirubin: 0.7 mg/dL (ref 0.3–0.9)
TOTAL PROTEIN: 6.7 g/dL (ref 6.5–8.1)

## 2017-07-10 LAB — MAGNESIUM: MAGNESIUM: 1 mg/dL — AB (ref 1.7–2.4)

## 2017-07-10 LAB — CBC
HEMATOCRIT: 37.3 % (ref 36.0–46.0)
HEMOGLOBIN: 13.3 g/dL (ref 12.0–15.0)
MCH: 29.4 pg (ref 26.0–34.0)
MCHC: 35.7 g/dL (ref 30.0–36.0)
MCV: 82.5 fL (ref 78.0–100.0)
Platelets: 89 10*3/uL — ABNORMAL LOW (ref 150–400)
RBC: 4.52 MIL/uL (ref 3.87–5.11)
RDW: 12.7 % (ref 11.5–15.5)
WBC: 8.5 10*3/uL (ref 4.0–10.5)

## 2017-07-10 LAB — I-STAT TROPONIN, ED: TROPONIN I, POC: 0 ng/mL (ref 0.00–0.08)

## 2017-07-10 LAB — LIPASE, BLOOD: LIPASE: 29 U/L (ref 11–51)

## 2017-07-10 MED ORDER — POTASSIUM CHLORIDE CRYS ER 10 MEQ PO TBCR: 10.0000 meq | EXTENDED_RELEASE_TABLET | Freq: Every day | ORAL | 0 refills | Status: DC

## 2017-07-10 NOTE — ED Provider Notes (Signed)
[facility]-EMERGENCY DEPT Provider Note   CSN: [id number] Arrival date & time: 07/10/17  1306     History   Chief Complaint Chief Complaint  Patient presents with  . Chest Pain    HPI [name] is a 36 y.o. female.  HPI   [name] is a 36 y.o. female, with a history of Cerebral palsy, pacemaker due to SA node dysfunction, magnesium deficiency, and hypokalemia, presenting to the ED with lower central chest pain for the last few days. Patient's mother is at the bedside and states patient has had this complaint and has been evaluated for it multiple times before. States she feels like one of her pacer leads has come loose. Pain is described as "a shocking feeling like I stuck my finger in a light socket," 5/10, intermittent, worse at night and when lying down, radiates to the back and superiorly. Nothing makes the pain better. Pacemaker is located in the abdomen. States she feels like one of her pacer leads is loose. States this has happened before. New pacer was placed in August 2017.   Denies shortness of breath, N/V/D, diaphoresis, fever/chills, cough, or any other complaints.  Sees [facility] with [facility]. Has an appointment with her cardiologist tomorrow. 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  Past Medical History:  Diagnosis Date  . Carpal tunnel syndrome   . Cerebral palsy (HCC)   . Chronic back pain   . Gitelman syndrome   . Hypokalemia    now resolved  . Magnesium deficiency   . Pacemaker   . PONV (postoperative nausea and vomiting)   . 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  . RSD (reflex sympathetic dystrophy)    right foot,   . RSD (reflex sympathetic dystrophy)   . SA node dysfunction (HCC) 2008    Patient Active Problem List   Diagnosis Date Noted  . Right ovarian cyst 05/31/2015  . Abdominal pain, chronic, right lower quadrant 05/23/2015  . Hypomagnesemia 03/02/2014  . Back pain 03/02/2014     Past Surgical History:  Procedure Laterality Date  . ABDOMINAL HYSTERECTOMY Left 2013   Dr. Tiburcio Pea  . INSERT / REPLACE / REMOVE PACEMAKER    . ORTHOPEDIC SURGERY    . PACEMAKER INSERTION    . PORTACATH PLACEMENT      OB History    Gravida Para Term Preterm AB Living   0 0 0 0 0 0   SAB TAB Ectopic Multiple Live Births   0 0 0 0         Home Medications    Prior to Admission medications   Medication Sig Start Date End Date Taking? Authorizing Provider  calcium carbonate (TUMS) 500 MG chewable tablet Chew 2 tablets (400 mg of elemental calcium total) by mouth 3 (three) times daily as needed for indigestion or heartburn. 06/01/17 06/01/18 Yes Sharman Cheek, MD  famotidine (PEPCID) 20 MG tablet Take 1 tablet (20 mg total) by mouth 2 (two) times daily. 06/01/17  Yes Sharman Cheek, MD  methylphenidate (RITALIN) 20 MG tablet Take 20 mg by mouth 3 (three) times daily.    Yes [provider]  naproxen sodium (ANAPROX) 220 MG tablet Take 220 mg by mouth 2 (two) times daily with a meal.   Yes [provider]  pregabalin (LYRICA) 75 MG capsule Take 75 mg by mouth 3 (three) times daily.    Yes [provider]  promethazine (PHENERGAN) 25 MG tablet Take 1 tablet (25  mg total) by mouth every 6 (six) hours as needed for nausea or vomiting. 05/04/17  Yes Aela FilbertWilliams, Jonathan E, MD  hydrocortisone valerate ointment (WESTCORT) 0.2 % Apply 1 application topically 2 (two) times daily. Patient not taking: Reported on 09/17/2015 08/16/15   Eustace MooreMurray, Laura W, MD  mupirocin ointment (BACTROBAN) 2 % Apply 1 application topically 2 (two) times daily. To sore patches of leg rash Patient not taking: Reported on 09/17/2015 08/16/15   Eustace MooreMurray, Laura W, MD  naproxen (NAPROSYN) 500 MG tablet Take 1 tablet (500 mg total) by mouth 2 (two) times daily with a meal. Patient not taking: Reported on 09/17/2015 08/28/15   Sharman CheekStafford, Phillip, MD  potassium chloride SA (K-DUR,KLOR-CON) 10 MEQ tablet  Take 1 tablet (10 mEq total) by mouth daily. 07/10/17   Alawna Graybeal, Hillard DankerShawn C, PA-C    Family History Family History  Problem Relation Age of Onset  . Diabetes Mother   . Hypertension Mother     Social History Social History  Substance Use Topics  . Smoking status: Never Smoker  . Smokeless tobacco: Never Used  . Alcohol use No     Allergies   Cymbalta [duloxetine hcl]; Gabapentin; Prozac [fluoxetine hcl]; and Sulfa antibiotics   Review of Systems Review of Systems  Constitutional: Negative for chills, diaphoresis, fatigue and fever.  Respiratory: Negative for shortness of breath.   Cardiovascular: Positive for chest pain.  Gastrointestinal: Negative for abdominal pain, diarrhea, nausea and vomiting.  Neurological: Negative for dizziness, weakness, light-headedness and numbness.  All other systems reviewed and are negative.    Physical Exam Updated Vital Signs BP (!) 140/98   Temp 97.8 F (36.6 C)   Resp 19   LMP 06/09/2012   SpO2 100%   Physical Exam  Constitutional: She appears well-developed and well-nourished. No distress.  HENT:  Head: Normocephalic and atraumatic.  Eyes: Conjunctivae are normal.  Neck: Neck supple.  Cardiovascular: Normal rate, regular rhythm, normal heart sounds and intact distal pulses.   Equal pulses in the extremities bilaterally.  Pulmonary/Chest: Effort normal and breath sounds normal. No respiratory distress.  Abdominal: Soft. There is tenderness in the epigastric area. There is no guarding.    Musculoskeletal: She exhibits tenderness. She exhibits no edema.  Tenderness to the right lower back that reproduces patient's pain.  Lymphadenopathy:    She has no cervical adenopathy.  Neurological: She is alert.  Skin: Skin is warm and dry. She is not diaphoretic.  Psychiatric: She has a normal mood and affect. Her behavior is normal.  Nursing note and vitals reviewed.    ED Treatments / Results  Labs (all labs ordered are listed, but  only abnormal results are displayed) Labs Reviewed  BASIC METABOLIC PANEL - Abnormal; Notable for the following:       Result Value   Potassium 3.3 (*)    All other components within normal limits  CBC - Abnormal; Notable for the following:    Platelets 89 (*)    All other components within normal limits  MAGNESIUM - Abnormal; Notable for the following:    Magnesium 1.0 (*)    All other components within normal limits  URINALYSIS, ROUTINE W REFLEX MICROSCOPIC - Abnormal; Notable for the following:    Ketones, ur 20 (*)    All other components within normal limits  HEPATIC FUNCTION PANEL  LIPASE, BLOOD  I-STAT TROPONIN, ED    EKG  EKG Interpretation  Date/Time:  Sunday July 10 2017 13:13:20 EDT Ventricular Rate:  85 PR Interval:  200 QRS Duration: 94 QT Interval:  376 QTC Calculation: 447 R Axis:   95 Text Interpretation:  Suspect arm lead reversal, interpretation assumes no reversal Atrial-paced rhythm Rightward axis Pulmonary disease pattern Abnormal ECG Confirmed by Margarita Grizzle [phone]) on 07/10/2017 2:56:37 PM       Radiology Dg Chest 2 View  Result Date: 07/10/2017 CLINICAL DATA:  Chest pain EXAM: CHEST  2 VIEW COMPARISON:  06/01/2017 FINDINGS: Cardiac pacer leads are identified in the right atrium. Heart size is normal. No pleural effusion or edema. No airspace opacities. IMPRESSION: 1. No acute cardiopulmonary abnormalities. Electronically Signed   By: Signa Kell [name]   On: 07/10/2017 14:01    Procedures Procedures (including critical care time)  Medications Ordered in ED Medications - No data to display   Initial Impression / Assessment and Plan / ED Course  I have reviewed the triage vital signs and the nursing notes.  Pertinent labs & imaging results that were available during my care of the patient were reviewed by me and considered in my medical decision making (see chart for details).  Clinical Course as of Jul 10 1940  Sun Jul 10, 2017  1444  Spoke with Dr. Bradly Chris, radiologist, to review the chest xray and look more closely at the pacemaker leads. States that he sees no evidence that the pacer leads are fractured, but recommends consulting with cardiology.  [SJ]  1453 Spoke with Joey, rep from [employer], who states that there is no dysfunction or event noted with the pacemaker. States the battery is fine. States he has evaluated the patient multiple times for similar complaints when she presents at multiple sites around the region.  [SJ]  1530 Value is noted. Patient states this is not unusual for her and she is seeing a hematologist for it. Platelets: (!) 89 [SJ]  1620 Consistent with previous values. Patient states she has a magnesium supplement at home that she is supposed to take, but is not always compliant. No EKG changes. Magnesium: (!) 1.0 [SJ]    Clinical Course User Index [SJ] Donyea Gafford C, PA-C    Patient presents with complaint of atypical chest pain. Low suspicion for ACS. HEART score is 1, indicating low risk for a cardiac event. Wells criteria score is 0, indicating low risk for PE. Patient is nontoxic appearing, afebrile, not tachycardic, not tachypneic, not hypotensive, maintains SPO2 of 100% on room air, and is in no apparent distress. Patient's pain resolved in the ED and did not recur. Doubt PE or thoracic/abdominal aortic dissection. Patient has close cardiology follow-up. The patient was given instructions for home care as well as return precautions. Patient voices understanding of these instructions, accepts the plan, and is comfortable with discharge.   Findings and plan of care discussed with Margarita Grizzle, MD.   Vitals:   07/10/17 1545 07/10/17 1600 07/10/17 1615 07/10/17 1630  BP: 125/77 111/81 120/82 121/82  Pulse: 85 85 85 85  Resp: 18 16 15 17   Temp:      SpO2: 100% 100% 100% 100%     Final Clinical Impressions(s) / ED Diagnoses   Final diagnoses:  Chest pain, unspecified type   Hypomagnesemia    New Prescriptions Discharge Medication List as of 07/10/2017  4:32 PM    START taking these medications   Details  potassium chloride SA (K-DUR,KLOR-CON) 10 MEQ tablet Take 1 tablet (10 mEq total) by mouth daily., Starting Sun 07/10/2017, Print         Azaylea Maves, Alexander  C, PA-C 07/10/17 1941    Margarita Grizzleay, Danielle, MD 07/16/17 [phone]

## 2017-07-10 NOTE — Discharge Instructions (Signed)
Your lab and imaging results were encouraging. Your magnesium is low, but consistent with previous levels. Take your home magnesium supplement, as prescribed. Your potassium was also low. Please take the prescribed potassium supplement then have your values rechecked.  Please follow up with your cardiologist, as planned, this week. Return to the ED as needed.

## 2017-07-10 NOTE — ED Triage Notes (Signed)
Pt reports chest pain to centralized chest radiating into the back starting yesterday. Pt has a pacemaker.

## 2017-07-10 NOTE — ED Notes (Signed)
Pacemaker interrogated. 

## 2017-11-23 ENCOUNTER — Encounter (HOSPITAL_COMMUNITY): Payer: Self-pay

## 2017-11-23 ENCOUNTER — Emergency Department (HOSPITAL_COMMUNITY)
Admission: EM | Admit: 2017-11-23 | Discharge: 2017-11-23 | Discharge status: Against Medical Advice | Payer: Medicaid Other | Attending: Physician Assistant | Admitting: Physician Assistant

## 2017-11-23 DIAGNOSIS — Z5329 Procedure and treatment not carried out because of patient's decision for other reasons: Secondary | ICD-10-CM | POA: Insufficient documentation

## 2017-11-23 DIAGNOSIS — R42 Dizziness and giddiness: Secondary | ICD-10-CM | POA: Diagnosis present

## 2017-11-23 DIAGNOSIS — Z79899 Other long term (current) drug therapy: Secondary | ICD-10-CM | POA: Insufficient documentation

## 2017-11-23 LAB — CBC WITH DIFFERENTIAL/PLATELET
Basophils Absolute: 0 10*3/uL (ref 0.0–0.1)
Basophils Relative: 0 %
EOS ABS: 0 10*3/uL (ref 0.0–0.7)
EOS PCT: 0 %
HCT: 37.9 % (ref 36.0–46.0)
HEMOGLOBIN: 13.3 g/dL (ref 12.0–15.0)
LYMPHS ABS: 1.2 10*3/uL (ref 0.7–4.0)
LYMPHS PCT: 17 %
MCH: 29.8 pg (ref 26.0–34.0)
MCHC: 35.1 g/dL (ref 30.0–36.0)
MCV: 85 fL (ref 78.0–100.0)
MONOS PCT: 4 %
Monocytes Absolute: 0.3 10*3/uL (ref 0.1–1.0)
Neutro Abs: 5.7 10*3/uL (ref 1.7–7.7)
Neutrophils Relative %: 79 %
PLATELETS: 141 10*3/uL — AB (ref 150–400)
RBC: 4.46 MIL/uL (ref 3.87–5.11)
RDW: 13.1 % (ref 11.5–15.5)
WBC: 7.3 10*3/uL (ref 4.0–10.5)

## 2017-11-23 LAB — COMPREHENSIVE METABOLIC PANEL
ALK PHOS: 53 U/L (ref 38–126)
ALT: 15 U/L (ref 14–54)
ANION GAP: 9 (ref 5–15)
AST: 19 U/L (ref 15–41)
Albumin: 4.4 g/dL (ref 3.5–5.0)
BUN: 16 mg/dL (ref 6–20)
CALCIUM: 9.6 mg/dL (ref 8.9–10.3)
CO2: 24 mmol/L (ref 22–32)
CREATININE: 0.81 mg/dL (ref 0.44–1.00)
Chloride: 106 mmol/L (ref 101–111)
Glucose, Bld: 104 mg/dL — ABNORMAL HIGH (ref 65–99)
Potassium: 3.6 mmol/L (ref 3.5–5.1)
Sodium: 139 mmol/L (ref 135–145)
Total Bilirubin: 0.5 mg/dL (ref 0.3–1.2)
Total Protein: 6.7 g/dL (ref 6.5–8.1)

## 2017-11-23 LAB — PHOSPHORUS: PHOSPHORUS: 3.2 mg/dL (ref 2.5–4.6)

## 2017-11-23 LAB — MAGNESIUM: MAGNESIUM: 1 mg/dL — AB (ref 1.7–2.4)

## 2017-11-23 MED ORDER — MAGNESIUM SULFATE 2 GM/50ML IV SOLN
2.0000 g | Freq: Once | INTRAVENOUS | Status: DC
  Filled 2017-11-23: qty 50

## 2017-11-23 MED ORDER — LACTATED RINGERS IV SOLN: INTRAVENOUS | Status: DC

## 2017-11-23 NOTE — ED Notes (Signed)
Pt. Had IV taken out due to leaving AMA. Nurse aware.

## 2017-11-23 NOTE — ED Notes (Addendum)
Attempted to administer patient's magnesium sulfate and lactated ringers. Patient states "that is not the right one, that is the wrong brand." When asked, patient unable to state which magnesium she would like. MD called to bedside. MD informed patient of risks of leaving against medical advice. Patient last seen A&O in NAD.

## 2017-11-23 NOTE — ED Notes (Signed)
Bed: [id number] Expected date:  Expected time:  Means of arrival:  Comments: EMS Dizzy

## 2017-11-23 NOTE — ED Provider Notes (Signed)
[facility]-EMERGENCY DEPT Provider Note   CSN: [id number] Arrival date & time: 11/23/17  [id number]     History   Chief Complaint Chief Complaint  Patient presents with  . Dizziness    HPI [name] is a 36 y.o. female.  HPI   36 y.o. female with a history of cerebral palsy, pots syndrome, hypomagnesemia, cardiac pacemaker, Gettleman syndrome, who presents the emergency department with dizziness.  Patient presents with her mother.  In looking over her records it appears that she went to the emergency department on the fourth, fifth, and seventh (today is the 12th) .  [facility]  All 3 times she requested magnesium and then she felt like they were taking too long and she left AGAINST MEDICAL ADVICE.  Patient is now here with her mother requesting the same.  She states that she normally goes to the infusion clinic and receives 1 L of lactated Ringer's and 4 g of magnesium   Patient's had intermittent dizziness.  No chest pain.  Patient's [facility]  But her mom does not feel like she can take her because her knees hurt.        Past Medical History:  Diagnosis Date  . Carpal tunnel syndrome   . Cerebral palsy (HCC)   . Chronic back pain   . Gitelman syndrome   . Hypokalemia    now resolved  . Magnesium deficiency   . Pacemaker   . PONV (postoperative nausea and vomiting)   . 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  . RSD (reflex sympathetic dystrophy)    right foot,   . RSD (reflex sympathetic dystrophy)   . SA node dysfunction (HCC) 2008    Patient Active Problem List   Diagnosis Date Noted  . Right ovarian cyst 05/31/2015  . Abdominal pain, chronic, right lower quadrant 05/23/2015  . Hypomagnesemia 03/02/2014  . Back pain 03/02/2014    Past Surgical History:  Procedure Laterality Date  . ABDOMINAL HYSTERECTOMY Left 2013   [name]  . INSERT / REPLACE / REMOVE PACEMAKER    . ORTHOPEDIC SURGERY    . PACEMAKER INSERTION    . PORTACATH PLACEMENT      OB History    Gravida Para Term Preterm AB Living   0 0 0 0 0 0   SAB TAB Ectopic Multiple Live Births   0 0 0 0         Home Medications    Prior to Admission medications   Medication Sig Start Date End Date Taking? Authorizing Provider  calcium carbonate (TUMS) 500 MG chewable tablet Chew 2 tablets (400 mg of elemental calcium total) by mouth 3 (three) times daily as needed for indigestion or heartburn. 06/01/17 06/01/18  Sharman CheekStafford, Phillip, MD  famotidine (PEPCID) 20 MG tablet Take 1 tablet (20 mg total) by mouth 2 (two) times daily. 06/01/17   Sharman CheekStafford, Phillip, MD  hydrocortisone valerate ointment (WESTCORT) 0.2 % Apply 1 application topically 2 (two) times daily. Patient not taking: Reported on 09/17/2015 08/16/15   Eustace MooreMurray, Laura W, MD  methylphenidate (RITALIN) 20 MG tablet Take 20 mg by mouth 3 (three) times daily.     [provider]  mupirocin ointment (BACTROBAN) 2 % Apply 1 application topically 2 (two) times daily. To sore patches of leg rash Patient not taking: Reported on 09/17/2015 08/16/15   Ria ClockMurray, Laura  W, MD  naproxen (NAPROSYN) 500 MG tablet Take 1 tablet (500 mg total) by mouth 2 (two) times daily with a meal. Patient not taking: Reported on 09/17/2015 08/28/15   Sharman Cheek, MD  naproxen sodium (ANAPROX) 220 MG tablet Take 220 mg by mouth 2 (two) times daily with a meal.    [provider]  potassium chloride SA (K-DUR,KLOR-CON) 10 MEQ tablet Take 1 tablet (10 mEq total) by mouth daily. 07/10/17   Joy, Shawn C, PA-C  pregabalin (LYRICA) 75 MG capsule Take 75 mg by mouth 3 (three) times daily.     [provider]  promethazine (PHENERGAN) 25 MG tablet Take 1 tablet (25 mg total) by mouth every 6 (six) hours as needed for nausea or vomiting. 05/04/17   Dalayza Filbert, MD    Family History Family History    Problem Relation Age of Onset  . Diabetes Mother   . Hypertension Mother     Social History Social History   Tobacco Use  . Smoking status: Never Smoker  . Smokeless tobacco: Never Used  Substance Use Topics  . Alcohol use: No    Alcohol/week: 0.0 oz  . Drug use: No     Allergies   Cymbalta [duloxetine hcl]; Gabapentin; Prozac [fluoxetine hcl]; and Sulfa antibiotics   Review of Systems Review of Systems  Constitutional: Positive for fatigue. Negative for fever.  Respiratory: Negative for shortness of breath.   Cardiovascular: Negative for chest pain.  Neurological: Positive for dizziness.     Physical Exam Updated Vital Signs BP 127/72 (BP Location: Left Arm)   Pulse 88   Temp (!) 97.4 F (36.3 C) (Oral)   Resp 18   LMP 06/09/2012   SpO2 100%   Physical Exam  Constitutional: She is oriented to person, place, and time. She appears well-developed and well-nourished.  Chronic contractures.  HENT:  Head: Normocephalic and atraumatic.  Eyes: Right eye exhibits no discharge. Left eye exhibits no discharge.  Cardiovascular: Normal rate, regular rhythm and normal heart sounds.  No murmur heard. Pulmonary/Chest: Effort normal and breath sounds normal. She has no wheezes. She has no rales.  Abdominal: Soft. She exhibits no distension. There is no tenderness.  Neurological: She is oriented to person, place, and time.  Skin: Skin is warm and dry. She is not diaphoretic.  Psychiatric: She has a normal mood and affect.  Nursing note and vitals reviewed.    ED Treatments / Results  Labs (all labs ordered are listed, but only abnormal results are displayed) Labs Reviewed - No data to display  EKG  EKG Interpretation  Date/Time:  Wednesday November 23 2017 08:45:00 EST Ventricular Rate:  86 PR Interval:    QRS Duration: 99 QT Interval:  349 QTC Calculation: 418 R Axis:   95 Text Interpretation:  Sinus or ectopic atrial rhythm ssubtle st elevations n 2, 3  ,avf, does not meet STEMI requirments.  Confirmed by Bary Castilla ([id number]) on 11/23/2017 8:53:42 AM       Radiology No results found.  Procedures Procedures (including critical care time)  Medications Ordered in ED Medications - No data to display   Initial Impression / Assessment and Plan / ED Course  I have reviewed the triage vital signs and the nursing notes.  Pertinent labs & imaging results that were available during my care of the patient were reviewed by me and considered in my medical decision making (see chart for details).     36 y.o. female with a  history of cerebral palsy, pots syndrome, hypomagnesemia, cardiac pacemaker, Gettleman syndrome, who presents the emergency department with dizziness.  Patient presents with her mother.  In looking over her records it appears that she went to the emergency department on the fourth, fifth, and seventh (today is the 12th) .  [facility].  All 3 times she requested magnesium and then she felt like they were taking too long and she left AGAINST MEDICAL ADVICE.  Patient is now here with her mother requesting the same.  She states that she normally goes to the infusion clinic and receives 1 L of lactated Ringer's and 4 g of magnesium   Patient's had intermittent dizziness.  No chest pain.  Patient's mom states that [facility].  But her mom does not feel like she can take her because her knees hurt.    9:06 AM Upon initial eval patient said "I never been treated like this in my life".  Patient has been waiting for 44 minutes.  This is patient's fourth visit in the last 7 days for magnesium.  We offered again to get labs, and give effusion.  We encouraged mom to follow-up with her cardiologist in [place] as planned.  Patient's magnesium was 1.  We hung lactated Ringer's and got mag sulfate to hang.  The nurse took it out of its foil container and hung it and patient  was upset because she said that it is "not the right brand".  We explained that it was the same thing it is the foil pack she been taking off.  We even offered to show her the package of patient refused to look.  She said this is "not what I want" we asked what kind of magnesium she want,s she was unable to tell us.  Mother said "she is so difficult, I'll just take her home".  This will be the fourth time patient left AMA before getting her magnesium in the last 7 days.  We again warned her about disability and death. They both expressed understanding.   Final Clinical Impressions(s) / ED Diagnoses   Final diagnoses:  None    ED Discharge Orders    None       Abelino DerrickMackuen, Arisa Congleton Lyn, MD 11/24/17 [phone]

## 2017-11-23 NOTE — ED Triage Notes (Signed)
Per EMS, pt from home.  Pt c/o dizziness upon change in movement.  Started a couple of hours ago.  No recent n/v.  Negative orthostatics.  No new medications or treatments.  cbg 139, bp 128/80, hr 88, resp 16, 99% ra

## 2018-01-17 DIAGNOSIS — K811 Chronic cholecystitis: Secondary | ICD-10-CM | POA: Insufficient documentation

## 2018-11-10 ENCOUNTER — Emergency Department (HOSPITAL_COMMUNITY): Payer: Medicaid Other

## 2018-11-10 ENCOUNTER — Encounter (HOSPITAL_COMMUNITY): Payer: Self-pay | Admitting: Emergency Medicine

## 2018-11-10 ENCOUNTER — Emergency Department (HOSPITAL_COMMUNITY)
Admission: EM | Admit: 2018-11-10 | Discharge: 2018-11-10 | Disposition: A | Discharge status: Home / Self Care | Payer: Medicaid Other | Attending: Emergency Medicine | Admitting: Emergency Medicine

## 2018-11-10 DIAGNOSIS — Z950 Presence of cardiac pacemaker: Secondary | ICD-10-CM | POA: Diagnosis not present

## 2018-11-10 DIAGNOSIS — G809 Cerebral palsy, unspecified: Secondary | ICD-10-CM | POA: Diagnosis not present

## 2018-11-10 DIAGNOSIS — R110 Nausea: Secondary | ICD-10-CM | POA: Insufficient documentation

## 2018-11-10 DIAGNOSIS — R0789 Other chest pain: Secondary | ICD-10-CM

## 2018-11-10 DIAGNOSIS — Z79899 Other long term (current) drug therapy: Secondary | ICD-10-CM | POA: Diagnosis not present

## 2018-11-10 LAB — CBC WITH DIFFERENTIAL/PLATELET
Abs Immature Granulocytes: 0.03 10*3/uL (ref 0.00–0.07)
BASOS ABS: 0 10*3/uL (ref 0.0–0.1)
Basophils Relative: 0 %
EOS ABS: 0 10*3/uL (ref 0.0–0.5)
EOS PCT: 0 %
HEMATOCRIT: 36.9 % (ref 36.0–46.0)
HEMOGLOBIN: 12.3 g/dL (ref 12.0–15.0)
Immature Granulocytes: 0 %
LYMPHS ABS: 1.1 10*3/uL (ref 0.7–4.0)
LYMPHS PCT: 8 %
MCH: 29.9 pg (ref 26.0–34.0)
MCHC: 33.3 g/dL (ref 30.0–36.0)
MCV: 89.8 fL (ref 80.0–100.0)
MONOS PCT: 4 %
Monocytes Absolute: 0.5 10*3/uL (ref 0.1–1.0)
Neutro Abs: 11.6 10*3/uL — ABNORMAL HIGH (ref 1.7–7.7)
Neutrophils Relative %: 88 %
Platelets: 112 10*3/uL — ABNORMAL LOW (ref 150–400)
RBC: 4.11 MIL/uL (ref 3.87–5.11)
RDW: 12.8 % (ref 11.5–15.5)
WBC: 13.3 10*3/uL — ABNORMAL HIGH (ref 4.0–10.5)
nRBC: 0 % (ref 0.0–0.2)

## 2018-11-10 LAB — COMPREHENSIVE METABOLIC PANEL
ALBUMIN: 4 g/dL (ref 3.5–5.0)
ALK PHOS: 51 U/L (ref 38–126)
ALT: 45 U/L — ABNORMAL HIGH (ref 0–44)
ANION GAP: 7 (ref 5–15)
AST: 64 U/L — AB (ref 15–41)
BILIRUBIN TOTAL: 0.7 mg/dL (ref 0.3–1.2)
BUN: 25 mg/dL — AB (ref 6–20)
CALCIUM: 8.7 mg/dL — AB (ref 8.9–10.3)
CO2: 28 mmol/L (ref 22–32)
Chloride: 103 mmol/L (ref 98–111)
Creatinine, Ser: 0.71 mg/dL (ref 0.44–1.00)
GFR calc Af Amer: >60 mL/min (ref >60)
GLUCOSE: 111 mg/dL — AB (ref 70–99)
POTASSIUM: 3.7 mmol/L (ref 3.5–5.1)
Sodium: 138 mmol/L (ref 135–145)
TOTAL PROTEIN: 6.4 g/dL — AB (ref 6.5–8.1)

## 2018-11-10 LAB — URINALYSIS, ROUTINE W REFLEX MICROSCOPIC
Bilirubin Urine: NEGATIVE
GLUCOSE, UA: NEGATIVE mg/dL
KETONES UR: NEGATIVE mg/dL
Leukocytes, UA: NEGATIVE
Nitrite: NEGATIVE
PH: 7 (ref 5.0–8.0)
PROTEIN: NEGATIVE mg/dL
SPECIFIC GRAVITY, URINE: 1.014 (ref 1.005–1.030)

## 2018-11-10 LAB — I-STAT TROPONIN, ED: Troponin i, poc: 0 ng/mL (ref 0.00–0.08)

## 2018-11-10 LAB — PREGNANCY, URINE: PREG TEST UR: NEGATIVE

## 2018-11-10 MED ORDER — SODIUM CHLORIDE 0.9 % IV BOLUS
1000.0000 mL | Freq: Once | INTRAVENOUS | Status: AC
  Administered 2018-11-10: 1000 mL via INTRAVENOUS

## 2018-11-10 NOTE — ED Notes (Signed)
Bed: [id number] Expected date:  Expected time:  Means of arrival:  Comments: Vomiting, fever

## 2018-11-10 NOTE — ED Provider Notes (Signed)
[id number]: Patient handed off to me by previous ED PA.  Please see previous note for full details.  Briefly, patient is a 37 year old female who presents for evaluation of right flank discomfort x1 week.  She has had a work-up by PCP for this abdominal pain and had a recent CT renal that showed stool burden but no kidney stones, appendicitis, acute pelvic processes.  History of adnexal cyst but none seen on CT. History of hysterectomy and cholecystectomy.  There is low concern for pyelonephritis, appendicitis, ovarian torsion, renal stone or other acute intra-abdominal/pelvic abnormality given chronicity of symptoms, exam.  She has had no abdominal pain while in the ER and abdomen has been nontender.   Additionally pt reported chest pressure, lightheadedness.  She has history of pots, SA node dysfunction status post pacemaker.  There is low concern for cardiac ischemia, PE per previous team.    At shift change, BP is 106/94, VS WNL.  Troponin 0.  UA without infection.  EKG and chest x-ray unremarkable.  Plan is to reassess, f/u on CBC, CMP. Anticipate discharge.   Physical Exam  BP (!) 106/94   Pulse 85   Temp 98.5 F (36.9 C)   Resp 12   LMP 06/09/2012   SpO2 100%   Physical Exam  Constitutional: She is oriented to person, place, and time. She appears well-developed and well-nourished.  Non toxic  HENT:  Head: Normocephalic and atraumatic.  Nose: Nose normal.  Eyes: Pupils are equal, round, and reactive to light. Conjunctivae and EOM are normal.  Neck: Normal range of motion.  Cardiovascular: Normal rate and regular rhythm.  Pulmonary/Chest: Effort normal and breath sounds normal.  Abdominal: Soft. Bowel sounds are normal. There is no tenderness.  No G/R/R. No suprapubic or CVA tenderness. Negative Murphy's and McBurney's. Active BS to lower quadrants.   Musculoskeletal: Normal range of motion.  Neurological: She is alert and oriented to person, place, and time.  Skin: Skin is warm and dry.  Capillary refill takes less than 2 seconds.  Psychiatric: She has a normal mood and affect. Her behavior is normal.  Nursing note and vitals reviewed.   ED Course/Procedures     Procedures  MDM   0725: Patient evaluated.  Patient states her chest pressure is sternal, constant onset since last night without aggravating factors.  Nonexertional, nonpleuritic.  Reports her lightheadedness has resolved.  States her right flank has not been bothering her in the last day.  Discussed work-up with patient and mother.  Blood pressures have remained greater than 100 over 90s.  Mom states that her systolic BP is usually 100 at home.  Mother expressed frustration regarding staff responding to call bell.  States she called for someone to help patient urinate but nobody came in to help her so she had to do it herself.  I apologized about this and offered charge nurse to come speak to her but she declined.  Additionally, asked if her magnesium was checked because she has history of low magnesium that has needed replacement.  I explained magnesium was not ordered and there is no real indication to obtain giving her potassium is normal.  She expressed her frustration about this as well.  I offered drawing magnesium but states that she just wants to go home and mother declined.  Discussed return precautions.  Recommended follow-up with cardiology.      Liberty HandyGibbons, Jaisa Defino J, PA-C 11/10/18 0730    Derwood KaplanNanavati, Ankit, MD 11/11/18 [phone]

## 2018-11-10 NOTE — Discharge Instructions (Signed)
Work up today was reassuring including heart enzymes, chest x-ray, EKG, electrolytes. You reported concern about your magnesium but declined blood draw today.  Potassium and other electrolytes are normal.   Follow up with cardiology for further discussion of your chest pressure.   Return to the ER for cough, fevers, chest pain or shortness of breath with exertion, vomiting, diarrhea, blood in stool, worsening or new symptoms.

## 2018-11-10 NOTE — ED Triage Notes (Signed)
From home  -C/C nausea w/o vomiting, malaise, right side pain x 2 days, "feels similar to when she ahd a cyst on her ovary."  -said her and mom went to family's house for thanksgiving, came home not feeling well, woke up feeling worse. Cold sweats and chills at home -Hx pacemaker, cerebral palsy -Sinus rhythm, pacemaker set at 85   -refused zofran en route, had adverse effects from it, requests phenergan   -Vitals  -P 87 -BP CBG 123 -RR 18 -BP 110/80 -O2 98% RA  IV 20 G RAC

## 2018-11-10 NOTE — ED Notes (Signed)
Patient transported to X-ray via stretcher 

## 2018-11-10 NOTE — ED Provider Notes (Signed)
[facility]-EMERGENCY DEPT Provider Note   CSN: [id number] Arrival date & time: 11/10/18  0425    History   Chief Complaint Chief Complaint  Patient presents with  . Nausea    HPI [name] is a 37 y.o. female.   37 year old female with a history of cerebral palsy, POTS and SA node dysfunction s/p pacemaker placement, Gitelman syndrome, and RSD presents to the ED for evaluation.  She is a slightly poor historian and initially states that she awoke from sleep feeling nauseous, clammy with chills.  States that she was experiencing some pain to the right side of her abdomen.  Later reports that she did not experience pain in her abdomen, but pressure in her chest.  She felt lightheaded with this as though she was going to pass out.  States that she was told by her cardiologist that the lead on her pacemaker was nearing the end of its life and would need to be replaced soon in the future.  She has had no fevers, syncope, vomiting.  She states that she continues to experience this pressure in her chest.  On chart review, the patient was evaluated in her PCP office for RLQ and R flank pain 1 week ago.  Patient underwent outpatient CT scan on 11/03/18 which was suggestive only of constipation; no ureterolithiasis, appendicitis, ovarian cysts.  SHx significant for hysterectomy, cholecystectomy.    Past Medical History:  Diagnosis Date  . Carpal tunnel syndrome   . Cerebral palsy (HCC)   . Chronic back pain   . Gitelman syndrome   . Hypokalemia    now resolved  . Magnesium deficiency   . Pacemaker   . PONV (postoperative nausea and vomiting)   . 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  . RSD (reflex sympathetic dystrophy)    right foot,   . RSD (reflex sympathetic dystrophy)   . SA node dysfunction (HCC) 2008    Patient Active Problem List   Diagnosis Date Noted  . Right ovarian cyst 05/31/2015  . Abdominal pain, chronic,  right lower quadrant 05/23/2015  . Hypomagnesemia 03/02/2014  . Back pain 03/02/2014    Past Surgical History:  Procedure Laterality Date  . ABDOMINAL HYSTERECTOMY Left 2013   [name]  . INSERT / REPLACE / REMOVE PACEMAKER    . ORTHOPEDIC SURGERY    . PACEMAKER INSERTION    . PORTACATH PLACEMENT       OB History    Gravida  0   Para  0   Term  0   Preterm  0   AB  0   Living  0     SAB  0   TAB  0   Ectopic  0   Multiple  0   Live Births               Home Medications    Prior to Admission medications   Medication Sig Start Date End Date Taking? Authorizing Provider  ibuprofen (ADVIL,MOTRIN) 600 MG tablet Take 600 mg by mouth every 6 (six) hours as needed for fever, headache, mild pain, moderate pain or cramping.   Yes [provider]  methylphenidate (RITALIN) 20 MG tablet Take 20 mg by mouth 2 (two) times daily with breakfast and lunch.    Yes [provider]  pregabalin (LYRICA) 75 MG capsule Take 75 mg by mouth 4 (four) times daily.    Yes [provider]  potassium chloride  SA (K-DUR,KLOR-CON) 10 MEQ tablet Take 1 tablet (10 mEq total) by mouth daily. Patient not taking: Reported on 11/23/2017 07/10/17   Anselm Pancoast, PA-C    Family History Family History  Problem Relation Age of Onset  . Diabetes Mother   . Hypertension Mother     Social History Social History   Tobacco Use  . Smoking status: Never Smoker  . Smokeless tobacco: Never Used  Substance Use Topics  . Alcohol use: No    Alcohol/week: 0.0 standard drinks  . Drug use: No     Allergies   Cymbalta [duloxetine hcl]; Gabapentin; Prozac [fluoxetine hcl]; and Sulfa antibiotics   Review of Systems Review of Systems Ten systems reviewed and are negative for acute change, except as noted in the HPI.    Physical Exam Updated Vital Signs BP (!) 106/94   Pulse 85   Temp 98.5 F (36.9 C)   Resp 12   LMP 06/09/2012   SpO2 100%   Physical Exam    Constitutional: She is oriented to person, place, and time. She appears well-developed and well-nourished. No distress.  Pleasant, alert and in NAD. Nontoxic.  HENT:  Head: Normocephalic and atraumatic.  Eyes: Conjunctivae and EOM are normal. No scleral icterus.  Neck: Normal range of motion.  Cardiovascular: Normal rate, regular rhythm and intact distal pulses.  Pulmonary/Chest: Effort normal. No stridor. No respiratory distress. She has no wheezes. She has no rales.  Lungs CTAB. Respirations even and unlabored.  Musculoskeletal: Normal range of motion.  Neurological: She is alert and oriented to person, place, and time.  GCS 15. Speech is goal oriented.  Skin: Skin is warm and dry. No rash noted. She is not diaphoretic. No erythema. No pallor.  Psychiatric: She has a normal mood and affect. Her behavior is normal.  Nursing note and vitals reviewed.    ED Treatments / Results  Labs (all labs ordered are listed, but only abnormal results are displayed) Labs Reviewed  URINALYSIS, ROUTINE W REFLEX MICROSCOPIC - Abnormal; Notable for the following components:      Result Value   Hgb urine dipstick SMALL (*)    Bacteria, UA RARE (*)    All other components within normal limits  PREGNANCY, URINE  CBC WITH DIFFERENTIAL/PLATELET  COMPREHENSIVE METABOLIC PANEL  I-STAT TROPONIN, ED    EKG EKG Interpretation  Date/Time:  Friday November 10 2018 05:53:00 EST Ventricular Rate:  85 PR Interval:    QRS Duration: 103 QT Interval:  374 QTC Calculation: 445 R Axis:   93 Text Interpretation:  Atrial-paced complexes Borderline right axis deviation Confirmed by Derwood Kaplan ([id number]) on 11/10/2018 6:23:33 AM   Radiology Dg Chest 2 View  Result Date: 11/10/2018 CLINICAL DATA:  Chest pressure. EXAM: CHEST - 2 VIEW COMPARISON:  Radiograph 07/10/2017 FINDINGS: Right chest port tip in the SVC. Pacemaker wires from an inferior approach. Two sternotomy wires in the lower chest. External  device projects over the mid sternum.The cardiomediastinal contours are normal. The lungs are clear. Pulmonary vasculature is normal. No consolidation, pleural effusion, or pneumothorax. No acute osseous abnormalities are seen. IMPRESSION: No acute findings. Electronically Signed   By: Narda Rutherford [name]   On: 11/10/2018 05:43    Procedures Procedures (including critical care time)  Medications Ordered in ED Medications  sodium chloride 0.9 % bolus 1,000 mL (1,000 mLs Intravenous New Bag/Given 11/10/18 1191)     Initial Impression / Assessment and Plan / ED Course  I have reviewed the triage vital  signs and the nursing notes.  Pertinent labs & imaging results that were available during my care of the patient were reviewed by me and considered in my medical decision making (see chart for details).      37 year old female with a history of cerebral palsy presents to the emergency department for evaluation of nausea with lightheadedness and clamminess.  She states that the nausea was secondary to onset of pain which woke her from sleep.  Patient initially reporting that the pain which woke her was present in her right side.  Later reports that she was not experiencing abdominal discomfort, but instead chest pressure.  She does have a history of POTS and SA node dysfunction for which she has a pacemaker in place.  Abdominal pain was previously evaluated earlier this week with outpatient CT scan.  Scan, specifically, did not show any evidence of ureterolithiasis, adnexal cyst, appendicitis.  She has history of prior hysterectomy and cholecystectomy.  The patient has no signs of acute surgical abdomen on exam.  Heart with regular rate and rhythm.  Lungs grossly clear.  The patient is, overall, well-appearing.  She is in no distress or obvious discomfort.  She was given IV fluids given borderline low blood pressure.  The mother states that the patient's blood pressure often fluctuates and it is not  abnormal for it to be slightly low at times.  Patient pending CBC and metabolic panel.  Her urinalysis has been negative for UTI.  Pregnancy negative.  Troponin is 0.  If labs remain stable and consistent with baseline, I believe she is appropriate for outpatient primary care and cardiology follow-up.  Patient signed out to Sharen Heck, PA-C at change of shift he will follow-up on labs and disposition appropriately.   Final Clinical Impressions(s) / ED Diagnoses   Final diagnoses:  Nausea  Pressure in chest    ED Discharge Orders    None       Antony Madura, PA-C 11/10/18 4540    Derwood Kaplan, MD 11/11/18 [phone]

## 2019-06-08 ENCOUNTER — Emergency Department (HOSPITAL_COMMUNITY)
Admission: EM | Admit: 2019-06-08 | Discharge: 2019-06-08 | Disposition: A | Discharge status: Home / Self Care | Payer: Medicaid Other | Attending: Emergency Medicine | Admitting: Emergency Medicine

## 2019-06-08 ENCOUNTER — Emergency Department (HOSPITAL_COMMUNITY): Payer: Medicaid Other

## 2019-06-08 ENCOUNTER — Encounter (HOSPITAL_COMMUNITY): Payer: Self-pay | Admitting: Emergency Medicine

## 2019-06-08 DIAGNOSIS — Y92008 Other place in unspecified non-institutional (private) residence as the place of occurrence of the external cause: Secondary | ICD-10-CM | POA: Diagnosis not present

## 2019-06-08 DIAGNOSIS — Y9389 Activity, other specified: Secondary | ICD-10-CM | POA: Insufficient documentation

## 2019-06-08 DIAGNOSIS — S99922A Unspecified injury of left foot, initial encounter: Secondary | ICD-10-CM | POA: Diagnosis present

## 2019-06-08 DIAGNOSIS — Z79899 Other long term (current) drug therapy: Secondary | ICD-10-CM | POA: Diagnosis not present

## 2019-06-08 DIAGNOSIS — Y999 Unspecified external cause status: Secondary | ICD-10-CM | POA: Diagnosis not present

## 2019-06-08 DIAGNOSIS — G809 Cerebral palsy, unspecified: Secondary | ICD-10-CM | POA: Insufficient documentation

## 2019-06-08 DIAGNOSIS — Z23 Encounter for immunization: Secondary | ICD-10-CM | POA: Insufficient documentation

## 2019-06-08 DIAGNOSIS — S90812A Abrasion, left foot, initial encounter: Secondary | ICD-10-CM | POA: Diagnosis not present

## 2019-06-08 DIAGNOSIS — W230XXA Caught, crushed, jammed, or pinched between moving objects, initial encounter: Secondary | ICD-10-CM | POA: Insufficient documentation

## 2019-06-08 MED ORDER — TETANUS-DIPHTH-ACELL PERTUSSIS 5-2.5-18.5 LF-MCG/0.5 IM SUSP
0.5000 mL | Freq: Once | INTRAMUSCULAR | Status: DC
  Filled 2019-06-08: qty 0.5

## 2019-06-08 NOTE — Discharge Instructions (Signed)
X-ray is normal there are no fractures.  Keep the wound clean and dry.  Use clean water and mild soap.  Apply thin layer of Neosporin.  Monitor for signs of infection including redness, swelling, warmth, fever, pus.

## 2019-06-08 NOTE — ED Triage Notes (Signed)
Patient fell out of her wheelchair and then her wheelchair ran over her left foot.   EMS vitals: 110/82 BP 80 HR 16 Resp Rate

## 2019-06-08 NOTE — ED Notes (Signed)
Bed: LK44 Expected date:  Expected time:  Means of arrival:  Comments: EMS-foot lac-triage

## 2019-06-08 NOTE — ED Provider Notes (Signed)
[name]-EMER[facility] Provider Note   CSN: [id number] Arrival date & time: 06/08/19  1817    History   Chief Complaint Chief Complaint  Patient presents with  . Fall  . Foot Pain    HPI [name] is a 38 y.o. female with PMH below presents to the ER with her mother for evaluation of left foot injury.  Patient states that she was on the ground putting groceries away in the cabinets when her electric chair all of a sudden started moving unfortunately running over her left foot.  Her foot was stuck under the chair for a few seconds but she was eventually able to pull it out.  She has a skin tear to the bottom of her left big toe.  She denies any other injuries.  Mother reports some bleeding from the wound and she was not sure if there was some fracture so she brought to the ER for evaluation.  EMS applied a bandage.  No other interventions.  No modifying factors.  Her electric wheelchair is approx 200 lb.     HPI  Past Medical History:  Diagnosis Date  . Carpal tunnel syndrome   . Cerebral palsy (HCC)   . Chronic back pain   . Gitelman syndrome   . Hypokalemia    now resolved  . Magnesium deficiency   . Pacemaker   . PONV (postoperative nausea and vomiting)   . 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  . RSD (reflex sympathetic dystrophy)    right foot,   . RSD (reflex sympathetic dystrophy)   . SA node dysfunction (HCC) 2008    Patient Active Problem List   Diagnosis Date Noted  . Right ovarian cyst 05/31/2015  . Abdominal pain, chronic, right lower quadrant 05/23/2015  . Hypomagnesemia 03/02/2014  . Back pain 03/02/2014    Past Surgical History:  Procedure Laterality Date  . ABDOMINAL HYSTERECTOMY Left 2013   [name]  . INSERT / REPLACE / REMOVE PACEMAKER    . ORTHOPEDIC SURGERY    . PACEMAKER INSERTION    . PORTACATH PLACEMENT       OB History    Gravida  0   Para  0   Term  0   Preterm   0   AB  0   Living  0     SAB  0   TAB  0   Ectopic  0   Multiple  0   Live Births               Home Medications    Prior to Admission medications   Medication Sig Start Date End Date Taking? Authorizing Provider  ibuprofen (ADVIL,MOTRIN) 600 MG tablet Take 600 mg by mouth every 6 (six) hours as needed for fever, headache, mild pain, moderate pain or cramping.    [provider]  methylphenidate (RITALIN) 20 MG tablet Take 20 mg by mouth 2 (two) times daily with breakfast and lunch.     [provider]  potassium chloride SA (K-DUR,KLOR-CON) 10 MEQ tablet Take 1 tablet (10 mEq total) by mouth daily. Patient not taking: Reported on 11/23/2017 07/10/17   Harolyn RutherfordJoy, Shawn C, PA-C  pregabalin (LYRICA) 75 MG capsule Take 75 mg by mouth 4 (four) times daily.     [provider]    Family History Family History  Problem Relation Age of Onset  . Diabetes Mother   . Hypertension Mother  Social History Social History   Tobacco Use  . Smoking status: Never Smoker  . Smokeless tobacco: Never Used  Substance Use Topics  . Alcohol use: No    Alcohol/week: 0.0 standard drinks  . Drug use: No     Allergies   Cymbalta [duloxetine hcl], Gabapentin, Prozac [fluoxetine hcl], and Sulfa antibiotics   Review of Systems Review of Systems  Skin: Positive for wound.  All other systems reviewed and are negative.    Physical Exam Updated Vital Signs BP 110/79 (BP Location: Right Arm)   Pulse 85   Temp 97.7 F (36.5 C) (Axillary)   Resp 15   LMP 06/09/2012   SpO2 99%   Physical Exam Constitutional:      Appearance: She is well-developed.  HENT:     Head: Normocephalic.     Nose: Nose normal.  Eyes:     General: Lids are normal.  Neck:     Musculoskeletal: Normal range of motion.  Cardiovascular:     Rate and Rhythm: Normal rate.     Comments: Left toes well perfused. Pulmonary:     Effort: Pulmonary effort is normal. No respiratory  distress.  Musculoskeletal: Normal range of motion.     Comments: Mild tenderness to the bottom of the left MTP along the abrasion.  No other focal bony tenderness to the left toes, MTPs, metatarsals, malleoli, calcaneus or Achilles.  Skin:    Findings: Abrasion present.     Comments: 2.5 cm abrasion to the bottom of the left MTP, minimally tender.  Hemostatic.  Mild surrounding tenderness.  No erythema, drainage, bleeding.    Neurological:     Mental Status: She is alert.     Comments: Sensation and strength intact in the left foot  Psychiatric:        Behavior: Behavior normal.      ED Treatments / Results  Labs (all labs ordered are listed, but only abnormal results are displayed) Labs Reviewed - No data to display  EKG None  Radiology Dg Foot Complete Left  Result Date: 06/08/2019 CLINICAL DATA:  38 year old female with fall with trauma to the left foot. EXAM: LEFT FOOT - COMPLETE 3+ VIEW COMPARISON:  None. FINDINGS: There is no evidence of fracture or dislocation. There is no evidence of arthropathy or other focal bone abnormality. Soft tissues are unremarkable. IMPRESSION: Negative. Electronically Signed   By: Anner Crete [name]   On: 06/08/2019 20:55    Procedures Procedures (including critical care time)  Medications Ordered in ED Medications  Tdap (BOOSTRIX) injection 0.5 mL (has no administration in time range)     Initial Impression / Assessment and Plan / ED Course  I have reviewed the triage vital signs and the nursing notes.  Pertinent labs & imaging results that were available during my care of the patient were reviewed by me and considered in my medical decision making (see chart for details).    Small skin tear hemostatic with local tenderness only.  No other signs of injury.  X-ray is negative for associated fracture.  Tetanus updated.  Dressing applied.  DC with wound care instructions.  Return precautions discussed with mother who is comfortable with  ER work-up and discharge.  Final Clinical Impressions(s) / ED Diagnoses   Final diagnoses:  Abrasion of left foot, initial encounter    ED Discharge Orders    None       Arlean Hopping 06/08/19 2115    Malvin Johns, MD 06/08/19 2235

## 2019-08-06 ENCOUNTER — Emergency Department (HOSPITAL_COMMUNITY)
Admission: EM | Admit: 2019-08-06 | Discharge: 2019-08-06 | Disposition: A | Discharge status: Home / Self Care | Payer: Medicaid Other | Attending: Emergency Medicine | Admitting: Emergency Medicine

## 2019-08-06 ENCOUNTER — Other Ambulatory Visit: Payer: Self-pay

## 2019-08-06 ENCOUNTER — Encounter (HOSPITAL_COMMUNITY): Payer: Self-pay | Admitting: *Deleted

## 2019-08-06 DIAGNOSIS — I498 Other specified cardiac arrhythmias: Secondary | ICD-10-CM | POA: Insufficient documentation

## 2019-08-06 DIAGNOSIS — G809 Cerebral palsy, unspecified: Secondary | ICD-10-CM | POA: Insufficient documentation

## 2019-08-06 DIAGNOSIS — Z950 Presence of cardiac pacemaker: Secondary | ICD-10-CM | POA: Diagnosis not present

## 2019-08-06 DIAGNOSIS — R55 Syncope and collapse: Secondary | ICD-10-CM | POA: Insufficient documentation

## 2019-08-06 DIAGNOSIS — R42 Dizziness and giddiness: Secondary | ICD-10-CM

## 2019-08-06 LAB — BASIC METABOLIC PANEL
Anion gap: 10 (ref 5–15)
BUN: 16 mg/dL (ref 6–20)
CO2: 24 mmol/L (ref 22–32)
Calcium: 8.9 mg/dL (ref 8.9–10.3)
Chloride: 105 mmol/L (ref 98–111)
Creatinine, Ser: 0.86 mg/dL (ref 0.44–1.00)
GFR calc Af Amer: >60 mL/min (ref >60)
GFR calc non Af Amer: >60 mL/min (ref >60)
Glucose, Bld: 108 mg/dL — ABNORMAL HIGH (ref 70–99)
Potassium: 3.4 mmol/L — ABNORMAL LOW (ref 3.5–5.1)
Sodium: 139 mmol/L (ref 135–145)

## 2019-08-06 LAB — MAGNESIUM: Magnesium: 1.2 mg/dL — ABNORMAL LOW (ref 1.7–2.4)

## 2019-08-06 LAB — CBC WITH DIFFERENTIAL/PLATELET
Abs Immature Granulocytes: 0.01 10*3/uL (ref 0.00–0.07)
Basophils Absolute: 0.1 10*3/uL (ref 0.0–0.1)
Basophils Relative: 1 %
Eosinophils Absolute: 0.1 10*3/uL (ref 0.0–0.5)
Eosinophils Relative: 1 %
HCT: 35.3 % — ABNORMAL LOW (ref 36.0–46.0)
Hemoglobin: 11.9 g/dL — ABNORMAL LOW (ref 12.0–15.0)
Immature Granulocytes: 0 %
Lymphocytes Relative: 32 %
Lymphs Abs: 1.8 10*3/uL (ref 0.7–4.0)
MCH: 29.8 pg (ref 26.0–34.0)
MCHC: 33.7 g/dL (ref 30.0–36.0)
MCV: 88.5 fL (ref 80.0–100.0)
Monocytes Absolute: 0.5 10*3/uL (ref 0.1–1.0)
Monocytes Relative: 8 %
Neutro Abs: 3.3 10*3/uL (ref 1.7–7.7)
Neutrophils Relative %: 58 %
Platelets: 150 10*3/uL (ref 150–400)
RBC: 3.99 MIL/uL (ref 3.87–5.11)
RDW: 13.1 % (ref 11.5–15.5)
WBC: 5.6 10*3/uL (ref 4.0–10.5)
nRBC: 0 % (ref 0.0–0.2)

## 2019-08-06 MED ORDER — MAGNESIUM SULFATE 2 GM/50ML IV SOLN: 2.0000 g | Freq: Once | INTRAVENOUS | Status: DC

## 2019-08-06 NOTE — ED Triage Notes (Signed)
Pt to ED by EMS for increased lightheadedness. Hx of POTS. Has noticed pulse to increase to 150s when lying flat. Also reports her pacemaker Scientist, physiological) battery is low

## 2019-08-06 NOTE — ED Provider Notes (Signed)
[facility]   CSN: [id number] Arrival date & time: 08/06/19  0355     History   Chief Complaint Chief Complaint  Patient presents with  . Near Syncope    POTS    HPI [name] is a 38 y.o. female.     The history is provided by the patient and a parent. The history is limited by the condition of the patient.  Near Syncope This is a new problem. The problem occurs constantly. The problem has been gradually improving. Associated symptoms include chest pain. Nothing aggravates the symptoms. Nothing relieves the symptoms.  Patient with history of cerebral palsy, pots syndrome, SA node dysfunction with pacemaker in place presents with near syncope.  She felt lightheaded and increased pulse at home.  She also reports of chest pain.  No fevers or vomiting.  She has a long history of these episodes, and was disrupted her sleep tonight. She has a pacemaker in place reports that battery is low. She has had similar episodes previously Past Medical History:  Diagnosis Date  . Carpal tunnel syndrome   . Cerebral palsy (HCC)   . Chronic back pain   . Gitelman syndrome   . Hypokalemia    now resolved  . Magnesium deficiency   . Pacemaker   . PONV (postoperative nausea and vomiting)   . 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  . RSD (reflex sympathetic dystrophy)    right foot,   . RSD (reflex sympathetic dystrophy)   . SA node dysfunction (HCC) 2008    Patient Active Problem List   Diagnosis Date Noted  . Right ovarian cyst 05/31/2015  . Abdominal pain, chronic, right lower quadrant 05/23/2015  . Hypomagnesemia 03/02/2014  . Back pain 03/02/2014    Past Surgical History:  Procedure Laterality Date  . ABDOMINAL HYSTERECTOMY Left 2013   [name]  . INSERT / REPLACE / REMOVE PACEMAKER    . ORTHOPEDIC SURGERY    . PACEMAKER INSERTION    . PORTACATH PLACEMENT       OB History    Gravida  0   Para  0   Term  0   Preterm  0   AB  0   Living  0     SAB  0   TAB  0   Ectopic  0   Multiple  0   Live Births               Home Medications    Prior to Admission medications   Medication Sig Start Date End Date Taking? Authorizing Provider  ibuprofen (ADVIL,MOTRIN) 600 MG tablet Take 600 mg by mouth every 6 (six) hours as needed for fever, headache, mild pain, moderate pain or cramping.    [provider]  methylphenidate (RITALIN) 20 MG tablet Take 20 mg by mouth 2 (two) times daily with breakfast and lunch.     [provider]  potassium chloride SA (K-DUR,KLOR-CON) 10 MEQ tablet Take 1 tablet (10 mEq total) by mouth daily. Patient not taking: Reported on 11/23/2017 07/10/17   Harolyn RutherfordJoy, Shawn C, PA-C  pregabalin (LYRICA) 75 MG capsule Take 75 mg by mouth 4 (four) times daily.     [provider]    Family History Family History  Problem Relation Age of Onset  . Diabetes Mother   . Hypertension Mother     Social History Social History   Tobacco Use  .  Smoking status: Never Smoker  . Smokeless tobacco: Never Used  Substance Use Topics  . Alcohol use: No    Alcohol/week: 0.0 standard drinks  . Drug use: No     Allergies   Cymbalta [duloxetine hcl], Gabapentin, Prozac [fluoxetine hcl], and Sulfa antibiotics   Review of Systems Review of Systems  Cardiovascular: Positive for chest pain and near-syncope.  Neurological: Positive for light-headedness.  All other systems reviewed and are negative.    Physical Exam Updated Vital Signs BP 121/84 (BP Location: Right Arm)   Pulse 86 Comment: Simultaneous filing. User may not have seen previous data.  Temp 97.9 F (36.6 C) (Oral)   Resp 19 Comment: Simultaneous filing. User may not have seen previous data.  LMP 06/09/2012   SpO2 100% Comment: Simultaneous filing. User may not have seen previous data.  Physical Exam CONSTITUTIONAL: awake/alert, no distress  HEAD: Normocephalic/atraumatic EYES: EOMI/PERRL ENMT: Mucous membranes moist NECK: supple no meningeal signs SPINE/BACK:entire spine nontender CV: S1/S2 noted, no murmurs/rubs/gallops noted LUNGS: Lungs are clear to auscultation bilaterally, no apparent distress ABDOMEN: soft, nontender, no rebound or guarding, bowel sounds noted throughout abdomen GU:no cva tenderness NEURO: Pt is awake/alert, maex4.  Speech is goal oriented but is difficult to understand at times due to underlying CP EXTREMITIES: chronic contractures noted.   SKIN: warm, color normal PSYCH: mildly anxious  ED Treatments / Results  Labs (all labs ordered are listed, but only abnormal results are displayed) Labs Reviewed  BASIC METABOLIC PANEL - Abnormal; Notable for the following components:      Result Value   Potassium 3.4 (*)    Glucose, Bld 108 (*)    All other components within normal limits  CBC WITH DIFFERENTIAL/PLATELET - Abnormal; Notable for the following components:   Hemoglobin 11.9 (*)    HCT 35.3 (*)    All other components within normal limits  MAGNESIUM - Abnormal; Notable for the following components:   Magnesium 1.2 (*)    All other components within normal limits    EKG  ED ECG REPORT   Date: 08/06/2019 0409am  Rate: paced rhythm  Rhythm: normal sinus rhythm  QRS Axis: right  Intervals: normal  ST/T Wave abnormalities: nonspecific ST changes  Conduction Disutrbances:none  Narrative Interpretation:   Old EKG Reviewed: unchanged evalutation limited due to artifact But overall no significant changes I have personally reviewed the EKG tracing and agree with the computerized printout as noted.  Radiology No results found.  Procedures Procedures   Medications Ordered in ED Medications  magnesium sulfate IVPB 2 g 50 mL (has no administration in time range)     Initial Impression / Assessment and Plan / ED Course  I have reviewed the triage vital signs and the nursing notes.   Pertinent labs results that were available during my care of the patient were reviewed by me and considered in my medical decision making (see chart for details).        4:27 AM Patient with history of cerebral palsy, pots syndrome, SA node dysfunction with pacemaker presents with lightheadedness, dizziness and tachycardia.  She reports she had difficulty sleeping.  She has had these episodes before She is in no acute distress at this time.  Vitals are appropriate.  EKG was limited due to artifact, but no gross abnormalities compared to prior.  Pacemaker appears to be working.  [facility] as well as check labs electrolytes at patient request.  Unfortunately I am unable to  find any recent cardiology notes because it is in an outside hospital system 5:49 AM Patient stable.  Per interrogation report, no events noted, time to  explant will be in 6 months. Patient found to be hypomagnesemic. Patient be given 2 g IV over 1 hour per pharmacy recommendations. Patient otherwise will be stable for discharge home.  No other acute issues identified at this time 7:10 AM Patient refused magnesium infusion.  She prefers to be discharged.  She reports taking supplementation at home.  She is also unable to get outpatient infusions. Encourage patient to follow closely and she will need the magnesium replaced. Labs given to patient and her mother. Final Clinical Impressions(s) / ED Diagnoses   Final diagnoses:  Lightheadedness  Hypomagnesemia    ED Discharge Orders    None       Ripley Fraise, MD 08/06/19 [phone]

## 2019-08-06 NOTE — ED Notes (Signed)
Pt refusing the Mg and to have her port accessed. States that she doesn't need it and that "It takes 2 hours to get Magnesium, not one." Will inform MD

## 2019-08-06 NOTE — ED Notes (Addendum)
[employer] interrogated successfully.

## 2019-08-06 NOTE — Progress Notes (Signed)
IV team consulted to access port-a-cath, pt refused this RN to access port because she stated "I don't want the medication that was ordered" Pt RN to talk to pt. Pt refused port to be accessed at this time. RN to make MD aware of pt's request.  Delilah Shan Brookley Spitler,RN-VAST

## 2019-08-06 NOTE — ED Notes (Signed)
Patient verbalizes understanding of discharge instructions. Opportunity for questioning and answers were provided. Armband removed by staff, pt discharged from ED by wheelchair   

## 2019-12-16 ENCOUNTER — Other Ambulatory Visit (HOSPITAL_COMMUNITY)
Admission: RE | Admit: 2019-12-16 | Discharge: 2019-12-16 | Disposition: A | Discharge status: Home / Self Care | Payer: Medicaid Other | Source: Other Acute Inpatient Hospital | Attending: Emergency Medicine | Admitting: Emergency Medicine

## 2019-12-16 DIAGNOSIS — R002 Palpitations: Secondary | ICD-10-CM | POA: Diagnosis present

## 2019-12-16 LAB — CBC WITH DIFFERENTIAL/PLATELET
Abs Immature Granulocytes: 0.01 10*3/uL (ref 0.00–0.07)
Basophils Absolute: 0 10*3/uL (ref 0.0–0.1)
Basophils Relative: 0 %
Eosinophils Absolute: 0 10*3/uL (ref 0.0–0.5)
Eosinophils Relative: 0 %
HCT: 36.4 % (ref 36.0–46.0)
Hemoglobin: 12.4 g/dL (ref 12.0–15.0)
Immature Granulocytes: 0 %
Lymphocytes Relative: 27 %
Lymphs Abs: 1.9 10*3/uL (ref 0.7–4.0)
MCH: 30.3 pg (ref 26.0–34.0)
MCHC: 34.1 g/dL (ref 30.0–36.0)
MCV: 89 fL (ref 80.0–100.0)
Monocytes Absolute: 0.4 10*3/uL (ref 0.1–1.0)
Monocytes Relative: 7 %
Neutro Abs: 4.4 10*3/uL (ref 1.7–7.7)
Neutrophils Relative %: 66 %
Platelets: 121 10*3/uL — ABNORMAL LOW (ref 150–400)
RBC: 4.09 MIL/uL (ref 3.87–5.11)
RDW: 13 % (ref 11.5–15.5)
WBC: 6.8 10*3/uL (ref 4.0–10.5)
nRBC: 0 % (ref 0.0–0.2)

## 2019-12-16 LAB — COMPREHENSIVE METABOLIC PANEL
ALT: 29 U/L (ref 0–44)
AST: 25 U/L (ref 15–41)
Albumin: 3.9 g/dL (ref 3.5–5.0)
Alkaline Phosphatase: 68 U/L (ref 38–126)
Anion gap: 5 (ref 5–15)
BUN: 23 mg/dL — ABNORMAL HIGH (ref 6–20)
CO2: 30 mmol/L (ref 22–32)
Calcium: 8.9 mg/dL (ref 8.9–10.3)
Chloride: 104 mmol/L (ref 98–111)
Creatinine, Ser: 0.75 mg/dL (ref 0.44–1.00)
GFR calc Af Amer: >60 mL/min (ref >60)
GFR calc non Af Amer: >60 mL/min (ref >60)
Glucose, Bld: 104 mg/dL — ABNORMAL HIGH (ref 70–99)
Potassium: 3.8 mmol/L (ref 3.5–5.1)
Sodium: 139 mmol/L (ref 135–145)
Total Bilirubin: 0.9 mg/dL (ref 0.3–1.2)
Total Protein: 6.4 g/dL — ABNORMAL LOW (ref 6.5–8.1)

## 2019-12-16 LAB — MAGNESIUM: Magnesium: 1.3 mg/dL — ABNORMAL LOW (ref 1.7–2.4)

## 2020-01-15 ENCOUNTER — Other Ambulatory Visit (HOSPITAL_COMMUNITY)
Admission: AD | Admit: 2020-01-15 | Discharge: 2020-01-15 | Disposition: A | Discharge status: Home / Self Care | Payer: Medicaid Other | Source: Ambulatory Visit | Attending: Nephrology | Admitting: Nephrology

## 2020-01-15 DIAGNOSIS — R002 Palpitations: Secondary | ICD-10-CM | POA: Insufficient documentation

## 2020-01-15 LAB — MAGNESIUM: Magnesium: 1.3 mg/dL — ABNORMAL LOW (ref 1.7–2.4)

## 2020-02-03 ENCOUNTER — Encounter (HOSPITAL_COMMUNITY)
Admission: RE | Admit: 2020-02-03 | Discharge: 2020-02-03 | Disposition: A | Discharge status: Home / Self Care | Payer: Medicaid Other | Source: Skilled Nursing Facility | Attending: Nephrology | Admitting: Nephrology

## 2020-02-03 LAB — MAGNESIUM: Magnesium: 1.2 mg/dL — ABNORMAL LOW (ref 1.7–2.4)

## 2020-02-05 LAB — VITAMIN D 25 HYDROXY (VIT D DEFICIENCY, FRACTURES): Vit D, 25-Hydroxy: 4.6 ng/mL — ABNORMAL LOW (ref 30–100)

## 2020-02-10 ENCOUNTER — Other Ambulatory Visit (HOSPITAL_COMMUNITY)
Admission: AD | Admit: 2020-02-10 | Discharge: 2020-02-10 | Disposition: A | Discharge status: Home / Self Care | Payer: Medicaid Other | Source: Other Acute Inpatient Hospital | Attending: Nephrology | Admitting: Nephrology

## 2020-02-10 DIAGNOSIS — R002 Palpitations: Secondary | ICD-10-CM | POA: Diagnosis present

## 2020-02-10 LAB — MAGNESIUM: Magnesium: 1.2 mg/dL — ABNORMAL LOW (ref 1.7–2.4)

## 2020-03-15 ENCOUNTER — Other Ambulatory Visit
Admission: RE | Admit: 2020-03-15 | Discharge: 2020-03-15 | Disposition: A | Discharge status: Home / Self Care | Payer: Medicaid Other | Source: Ambulatory Visit | Attending: Nephrology | Admitting: Nephrology

## 2020-03-15 DIAGNOSIS — R002 Palpitations: Secondary | ICD-10-CM | POA: Insufficient documentation

## 2020-03-15 LAB — MAGNESIUM: Magnesium: 1.1 mg/dL — ABNORMAL LOW (ref 1.7–2.4)

## 2020-03-22 ENCOUNTER — Other Ambulatory Visit (HOSPITAL_COMMUNITY)
Admission: RE | Admit: 2020-03-22 | Discharge: 2020-03-22 | Disposition: A | Discharge status: Home / Self Care | Payer: Medicaid Other | Source: Other Acute Inpatient Hospital | Attending: Nephrology | Admitting: Nephrology

## 2020-03-22 DIAGNOSIS — R002 Palpitations: Secondary | ICD-10-CM | POA: Diagnosis present

## 2020-03-22 LAB — MAGNESIUM: Magnesium: 1 mg/dL — ABNORMAL LOW (ref 1.7–2.4)

## 2021-01-14 ENCOUNTER — Emergency Department: Payer: Medicaid Other

## 2021-01-14 ENCOUNTER — Encounter: Payer: Self-pay | Admitting: Emergency Medicine

## 2021-01-14 ENCOUNTER — Other Ambulatory Visit: Payer: Self-pay

## 2021-01-14 ENCOUNTER — Emergency Department
Admission: EM | Admit: 2021-01-14 | Discharge: 2021-01-14 | Disposition: A | Discharge status: Home / Self Care | Payer: Medicaid Other | Attending: Emergency Medicine | Admitting: Emergency Medicine

## 2021-01-14 DIAGNOSIS — R42 Dizziness and giddiness: Secondary | ICD-10-CM | POA: Diagnosis not present

## 2021-01-14 DIAGNOSIS — R0789 Other chest pain: Secondary | ICD-10-CM | POA: Diagnosis not present

## 2021-01-14 DIAGNOSIS — Z950 Presence of cardiac pacemaker: Secondary | ICD-10-CM | POA: Diagnosis not present

## 2021-01-14 DIAGNOSIS — R002 Palpitations: Secondary | ICD-10-CM | POA: Insufficient documentation

## 2021-01-14 LAB — CBC WITH DIFFERENTIAL/PLATELET
Abs Immature Granulocytes: 0.02 10*3/uL (ref 0.00–0.07)
Basophils Absolute: 0 10*3/uL (ref 0.0–0.1)
Basophils Relative: 0 %
Eosinophils Absolute: 0.2 10*3/uL (ref 0.0–0.5)
Eosinophils Relative: 2 %
HCT: 34.5 % — ABNORMAL LOW (ref 36.0–46.0)
Hemoglobin: 11.8 g/dL — ABNORMAL LOW (ref 12.0–15.0)
Immature Granulocytes: 0 %
Lymphocytes Relative: 27 %
Lymphs Abs: 2.2 10*3/uL (ref 0.7–4.0)
MCH: 29.2 pg (ref 26.0–34.0)
MCHC: 34.2 g/dL (ref 30.0–36.0)
MCV: 85.4 fL (ref 80.0–100.0)
Monocytes Absolute: 0.6 10*3/uL (ref 0.1–1.0)
Monocytes Relative: 7 %
Neutro Abs: 5.1 10*3/uL (ref 1.7–7.7)
Neutrophils Relative %: 64 %
Platelets: 148 10*3/uL — ABNORMAL LOW (ref 150–400)
RBC: 4.04 MIL/uL (ref 3.87–5.11)
RDW: 13.2 % (ref 11.5–15.5)
WBC: 8 10*3/uL (ref 4.0–10.5)
nRBC: 0 % (ref 0.0–0.2)

## 2021-01-14 LAB — BASIC METABOLIC PANEL
Anion gap: 7 (ref 5–15)
BUN: 18 mg/dL (ref 6–20)
CO2: 28 mmol/L (ref 22–32)
Calcium: 8.8 mg/dL — ABNORMAL LOW (ref 8.9–10.3)
Chloride: 104 mmol/L (ref 98–111)
Creatinine, Ser: 0.76 mg/dL (ref 0.44–1.00)
GFR, Estimated: >60 mL/min (ref >60)
Glucose, Bld: 106 mg/dL — ABNORMAL HIGH (ref 70–99)
Potassium: 3.6 mmol/L (ref 3.5–5.1)
Sodium: 139 mmol/L (ref 135–145)

## 2021-01-14 LAB — TROPONIN I (HIGH SENSITIVITY): Troponin I (High Sensitivity): <2 ng/L (ref <18)

## 2021-01-14 LAB — MAGNESIUM: Magnesium: 1.3 mg/dL — ABNORMAL LOW (ref 1.7–2.4)

## 2021-01-14 LAB — TSH: TSH: 2.368 u[IU]/mL (ref 0.350–4.500)

## 2021-01-14 MED ORDER — HEPARIN SOD (PORK) LOCK FLUSH 100 UNIT/ML IV SOLN
INTRAVENOUS | Status: AC
  Filled 2021-01-14: qty 5

## 2021-01-14 MED ORDER — SODIUM CHLORIDE 0.9 % IV BOLUS (SEPSIS)
1000.0000 mL | Freq: Once | INTRAVENOUS | Status: AC
  Administered 2021-01-14: 1000 mL via INTRAVENOUS

## 2021-01-14 MED ORDER — ACETAMINOPHEN 500 MG PO TABS
1000.0000 mg | ORAL_TABLET | Freq: Once | ORAL | Status: DC
  Filled 2021-01-14: qty 2

## 2021-01-14 MED ORDER — MAGNESIUM SULFATE 2 GM/50ML IV SOLN
2.0000 g | Freq: Once | INTRAVENOUS | Status: AC
  Administered 2021-01-14: 2 g via INTRAVENOUS
  Filled 2021-01-14: qty 50

## 2021-01-14 NOTE — Discharge Instructions (Addendum)
Please take your magnesium supplementation as prescribed by your nephrologist.  Other than having a low magnesium level, your labs, chest x-ray, EKG today were normal.  Your pacemaker showed no acute events when interrogated and is functioning properly.

## 2021-01-14 NOTE — ED Provider Notes (Signed)
[facility] Emergency Department Provider Note  ____________________________________________   Event Date/Time   First MD Initiated Contact with Patient 01/14/21 0425     (approximate)  I have reviewed the triage vital signs and the nursing notes.   HISTORY  Chief Complaint Dizziness and Back Pain    HPI [name] is a 40 y.o. female spastic quadriplegic cerebral palsy, POTS, sick sinus syndrome status post pacemaker who presents to the emergency department with complaints of palpitations that started last night causing discomfort in her chest and upper back.  She denies any injury to her back.  When asked to describe the discomfort further she states that it just feels like palpitations.  Has had some shortness of breath.  She has been lightheaded.  No nausea or vomiting but states she did vomit some last week.  No diarrhea.  No fever.  No cough.  Patient brought in by EMS.  Lives at home with her mother.  Blood pressures in the 90s systolic with EMS but improved on its own without intervention into the 110s.  In a paced rhythm with EMS.  It appears these are symptoms that she frequently has.  No history of PE or DVT.  No calf tenderness or calf swelling.  She is status post hysterectomy.        Past Medical History:  Diagnosis Date  . Carpal tunnel syndrome   . Cerebral palsy (HCC)   . Chronic back pain   . Gitelman syndrome   . Hypokalemia    now resolved  . Magnesium deficiency   . Pacemaker   . PONV (postoperative nausea and vomiting)   . 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  . RSD (reflex sympathetic dystrophy)    right foot,   . RSD (reflex sympathetic dystrophy)   . SA node dysfunction (HCC) 2008    Patient Active Problem List   Diagnosis Date Noted  . Right ovarian cyst 05/31/2015  . Abdominal pain, chronic, right lower quadrant 05/23/2015  . Hypomagnesemia 03/02/2014  . Back pain 03/02/2014     Past Surgical History:  Procedure Laterality Date  . ABDOMINAL HYSTERECTOMY Left 2013   Dr. Tiburcio Pea  . INSERT / REPLACE / REMOVE PACEMAKER    . ORTHOPEDIC SURGERY    . PACEMAKER INSERTION    . PORTACATH PLACEMENT      Prior to Admission medications   Medication Sig Start Date End Date Taking? Authorizing Provider  ibuprofen (ADVIL,MOTRIN) 600 MG tablet Take 600 mg by mouth every 6 (six) hours as needed for fever, headache, mild pain, moderate pain or cramping.    [provider]  methylphenidate (RITALIN) 20 MG tablet Take 20 mg by mouth 2 (two) times daily with breakfast and lunch.     [provider]  potassium chloride SA (K-DUR,KLOR-CON) 10 MEQ tablet Take 1 tablet (10 mEq total) by mouth daily. Patient not taking: Reported on 11/23/2017 07/10/17   Harolyn Rutherford C, PA-C  pregabalin (LYRICA) 75 MG capsule Take 75 mg by mouth 4 (four) times daily.     [provider]    Allergies Cymbalta [duloxetine hcl], Gabapentin, Prozac [fluoxetine hcl], and Sulfa antibiotics  Family History  Problem Relation Age of Onset  . Diabetes Mother   . Hypertension Mother     Social History Social History   Tobacco Use  . Smoking status: Never Smoker  . Smokeless tobacco: Never Used  Substance Use Topics  . Alcohol use:  No    Alcohol/week: 0.0 standard drinks  . Drug use: No    Review of Systems Constitutional: No fever. Eyes: No visual changes. ENT: No sore throat. Cardiovascular: + chest pain. Respiratory: + shortness of breath. Gastrointestinal: No nausea, vomiting, diarrhea. Genitourinary: Negative for dysuria. Musculoskeletal: Negative for back pain. Skin: Negative for rash. Neurological: Negative for focal weakness or numbness.  ____________________________________________   PHYSICAL EXAM:  VITAL SIGNS: ED Triage Vitals [01/14/21 0424]  Enc Vitals Group     BP (!) 142/92     Pulse Rate 85     Resp 17     Temp 98.3 F (36.8 C)     Temp  Source Oral     SpO2 100 %     Weight 95 lb (43.1 kg)     Height 5\' 2"  (1.575 m)     Head Circumference      Peak Flow      Pain Score 9     Pain Loc      Pain Edu?      Excl. in GC?    CONSTITUTIONAL: Alert and oriented and responds appropriately to questions.  Chronically ill-appearing.  Thin. HEAD: Normocephalic EYES: Conjunctivae clear, pupils appear equal, EOM appear intact ENT: normal nose; moist mucous membranes NECK: Supple, normal ROM CARD: RRR; S1 and S2 appreciated; no murmurs, no clicks, no rubs, no gallops CHEST: port noted without redness, swelling, tenderness RESP: Normal chest excursion without splinting or tachypnea; breath sounds clear and equal bilaterally; no wheezes, no rhonchi, no rales, no hypoxia or respiratory distress, speaking full sentences ABD/GI: Normal bowel sounds; non-distended; soft, non-tender, no rebound, no guarding, no peritoneal signs, no hepatosplenomegaly BACK: The back appears normal, no midline spinal tenderness or step-off or deformity EXT: Normal ROM in all joints; no deformity noted, no edema; no cyanosis SKIN: Normal color for age and race; warm; no rash on exposed skin NEURO: Spastic quadriplegia, no aphasia, patient has dysarthric speech which is her baseline PSYCH: The patient's mood and manner are appropriate.  ____________________________________________   LABS (all labs ordered are listed, but only abnormal results are displayed)  Labs Reviewed  CBC WITH DIFFERENTIAL/PLATELET - Abnormal; Notable for the following components:      Result Value   Hemoglobin 11.8 (*)    HCT 34.5 (*)    Platelets 148 (*)    All other components within normal limits  BASIC METABOLIC PANEL - Abnormal; Notable for the following components:   Glucose, Bld 106 (*)    Calcium 8.8 (*)    All other components within normal limits  MAGNESIUM - Abnormal; Notable for the following components:   Magnesium 1.3 (*)    All other components within normal  limits  TSH  TROPONIN I (HIGH SENSITIVITY)   ____________________________________________  EKG   EKG Interpretation  Date/Time:  Wednesday January 14 2021 04:21:43 EST Ventricular Rate:  85 PR Interval:    QRS Duration: 106 QT Interval:  381 QTC Calculation: 453 R Axis:   100 Text Interpretation: Normal sinus rhythm Borderline right axis deviation Artifact in lead(s) I aVR aVL V1 V2 Confirmed by [id number] [phone]) on 01/14/2021 4:31:19 AM       ____________________________________________  RADIOLOGY 03/14/2021 Heidy Mccubbin, personally viewed and evaluated these images (plain radiographs) as part of my medical decision making, as well as reviewing the written report by the radiologist.  ED MD interpretation: No acute abnormality noted.  Official radiology report(s): DG Chest Portable 1 View  Result Date:  01/14/2021 CLINICAL DATA:  Chest pain.  Shortness of breath. EXAM: PORTABLE CHEST 1 VIEW COMPARISON:  11/10/2018. FINDINGS: PowerPort catheter in stable position. Cardiac pacer noted with lead tip over the right atrium. Inferior pacing wires again noted in stable position. Heart size normal. Lungs are clear. No pleural effusion or pneumothorax. IMPRESSION: 1. No acute cardiopulmonary disease. 2. Cardiac pacer noted with lead tip over the right atrium. Inferior pacing wires again noted in stable position. PowerPort catheter in stable position. No pneumothorax. Electronically Signed   By: Maisie Fus  Register   On: 01/14/2021 06:19    ____________________________________________   PROCEDURES  Procedure(s) performed (including Critical Care):  Procedures  CRITICAL CARE Performed by: Baxter Hire Jakub Debold   Total critical care time: 45 minutes  Critical care time was exclusive of separately billable procedures and treating other patients.  Critical care was necessary to treat or prevent imminent or life-threatening deterioration.  Critical care was time spent personally by me on the  following activities: development of treatment plan with patient and/or surrogate as well as nursing, discussions with consultants, evaluation of patient's response to treatment, examination of patient, obtaining history from patient or surrogate, ordering and performing treatments and interventions, ordering and review of laboratory studies, ordering and review of radiographic studies, pulse oximetry and re-evaluation of patient's condition.  ____________________________________________   INITIAL IMPRESSION / ASSESSMENT AND PLAN / ED COURSE  As part of my medical decision making, I reviewed the following data within the electronic MEDICAL RECORD NUMBER History obtained from family, Nursing notes reviewed and incorporated, Labs reviewed, EKG interpreted NSR, Old chart reviewed, Radiograph reviewed and Notes from prior ED visits         Patient here with palpitations, dizziness and atypical chest pain.  Will check for anemia, electrolyte derangement, thyroid dysfunction.  Low suspicion for ACS, PE, dissection.  Hemodynamically stable here without tachycardia, tachypnea or hypoxia.  Currently well-appearing.     Labs show stable hemoglobin of 11.8.  Potassium level normal at 3.6.  Does have a magnesium level of 1.3.  On review of her records, it appears this is a chronic issue for patient.  Will give 2 g of IV magnesium here.  Troponin is negative.  TSH normal.  Public affairs consultant. No acute events since last reset on 10/24/2020.  Chest x-ray shows no acute abnormality.  Patient continues to be hemodynamically stable.  Will discharge home after IV magnesium completed.  Recommended follow-up with primary care physician as outpatient for reevaluation of magnesium level.  EKG shows no new interval changes.  QT interval normal.  It appears in care everywhere that Mag-Tab 84 mg (3 tabs 3 times daily) was reordered by her nephrologist Dr. Sonny Masters yesterday.  At this time, I do not  feel there is any life-threatening condition present. I have reviewed, interpreted and discussed all results (EKG, imaging, lab, urine as appropriate) and exam findings with patient/family. I have reviewed nursing notes and appropriate previous records.  I feel the patient is safe to be discharged home without further emergent workup and can continue workup as an outpatient as needed. Discussed usual and customary return precautions. Patient/family verbalize understanding and are comfortable with this plan.  Outpatient follow-up has been provided as needed. All questions have been answered.  ____________________________________________   FINAL CLINICAL IMPRESSION(S) / ED DIAGNOSES  Final diagnoses:  Palpitations  Hypomagnesemia     ED Discharge Orders    None      *Please note:  [name] was evaluated  in Emergency Department on 01/14/2021 for the symptoms described in the history of present illness. She was evaluated in the context of the global COVID-19 pandemic, which necessitated consideration that the patient might be at risk for infection with the SARS-CoV-2 virus that causes COVID-19. Institutional protocols and algorithms that pertain to the evaluation of patients at risk for COVID-19 are in a state of rapid change based on information released by regulatory bodies including the CDC and federal and state organizations. These policies and algorithms were followed during the patient's care in the ED.  Some ED evaluations and interventions may be delayed as a result of limited staffing during and the pandemic.*   Note:  This document was prepared using Dragon voice recognition software and may include unintentional dictation errors.   Aiman Noe, Layla Maw, DO 01/14/21 [phone]

## 2021-01-14 NOTE — ED Triage Notes (Signed)
Pt to ED via [facility] c/o dizziness and light headedness tonight from home.  Has mid chronic back pain, palpitations, nausea.  Hx of pacemaker with paced rhythm.  Per EMS pt at baseline with speech.

## 2021-08-30 ENCOUNTER — Emergency Department: Payer: No Typology Code available for payment source

## 2021-08-30 ENCOUNTER — Emergency Department
Admission: EM | Admit: 2021-08-30 | Discharge: 2021-08-30 | Disposition: A | Discharge status: Home / Self Care | Payer: No Typology Code available for payment source | Attending: Emergency Medicine | Admitting: Emergency Medicine

## 2021-08-30 ENCOUNTER — Encounter: Payer: Self-pay | Admitting: Emergency Medicine

## 2021-08-30 ENCOUNTER — Other Ambulatory Visit: Payer: Self-pay

## 2021-08-30 DIAGNOSIS — Z7901 Long term (current) use of anticoagulants: Secondary | ICD-10-CM | POA: Diagnosis not present

## 2021-08-30 DIAGNOSIS — Y92410 Unspecified street and highway as the place of occurrence of the external cause: Secondary | ICD-10-CM | POA: Insufficient documentation

## 2021-08-30 DIAGNOSIS — S0990XA Unspecified injury of head, initial encounter: Secondary | ICD-10-CM

## 2021-08-30 DIAGNOSIS — Z950 Presence of cardiac pacemaker: Secondary | ICD-10-CM | POA: Insufficient documentation

## 2021-08-30 LAB — BASIC METABOLIC PANEL
Anion gap: 9 (ref 5–15)
BUN: 19 mg/dL (ref 6–20)
CO2: 28 mmol/L (ref 22–32)
Calcium: 9.2 mg/dL (ref 8.9–10.3)
Chloride: 100 mmol/L (ref 98–111)
Creatinine, Ser: 1.01 mg/dL — ABNORMAL HIGH (ref 0.44–1.00)
GFR, Estimated: >60 mL/min (ref >60)
Glucose, Bld: 159 mg/dL — ABNORMAL HIGH (ref 70–99)
Potassium: 3.7 mmol/L (ref 3.5–5.1)
Sodium: 137 mmol/L (ref 135–145)

## 2021-08-30 LAB — CBC
HCT: 37.6 % (ref 36.0–46.0)
Hemoglobin: 13.3 g/dL (ref 12.0–15.0)
MCH: 30.8 pg (ref 26.0–34.0)
MCHC: 35.4 g/dL (ref 30.0–36.0)
MCV: 87 fL (ref 80.0–100.0)
Platelets: 141 10*3/uL — ABNORMAL LOW (ref 150–400)
RBC: 4.32 MIL/uL (ref 3.87–5.11)
RDW: 12.6 % (ref 11.5–15.5)
WBC: 6.8 10*3/uL (ref 4.0–10.5)
nRBC: 0 % (ref 0.0–0.2)

## 2021-08-30 LAB — MAGNESIUM: Magnesium: 1.3 mg/dL — ABNORMAL LOW (ref 1.7–2.4)

## 2021-08-30 MED ORDER — MAGNESIUM SULFATE 2 GM/50ML IV SOLN
2.0000 g | Freq: Once | INTRAVENOUS | Status: DC
  Filled 2021-08-30: qty 50
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
## 2021-08-30 NOTE — ED Notes (Signed)
Patient wheeled out with mother to wait for ride. Refused repeat vitals.
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
## 2021-08-30 NOTE — ED Notes (Signed)
Blue top in lab 

## 2022-10-15 DIAGNOSIS — N3289 Other specified disorders of bladder: Secondary | ICD-10-CM | POA: Insufficient documentation

## 2022-10-24 ENCOUNTER — Encounter (HOSPITAL_COMMUNITY): Payer: Self-pay | Admitting: Emergency Medicine

## 2022-10-24 ENCOUNTER — Emergency Department (HOSPITAL_COMMUNITY): Payer: Medicaid Other

## 2022-10-24 ENCOUNTER — Observation Stay (HOSPITAL_COMMUNITY)
Admission: EM | Admit: 2022-10-24 | Discharge: 2022-10-26 | Disposition: A | Discharge status: Home / Self Care | Payer: Medicaid Other | Attending: Internal Medicine | Admitting: Internal Medicine

## 2022-10-24 ENCOUNTER — Other Ambulatory Visit: Payer: Self-pay

## 2022-10-24 DIAGNOSIS — M545 Low back pain, unspecified: Secondary | ICD-10-CM | POA: Diagnosis not present

## 2022-10-24 DIAGNOSIS — J111 Influenza due to unidentified influenza virus with other respiratory manifestations: Secondary | ICD-10-CM | POA: Diagnosis present

## 2022-10-24 DIAGNOSIS — Z1152 Encounter for screening for COVID-19: Secondary | ICD-10-CM | POA: Diagnosis not present

## 2022-10-24 DIAGNOSIS — I509 Heart failure, unspecified: Secondary | ICD-10-CM | POA: Insufficient documentation

## 2022-10-24 DIAGNOSIS — N3001 Acute cystitis with hematuria: Secondary | ICD-10-CM

## 2022-10-24 DIAGNOSIS — J449 Chronic obstructive pulmonary disease, unspecified: Secondary | ICD-10-CM | POA: Insufficient documentation

## 2022-10-24 DIAGNOSIS — M549 Dorsalgia, unspecified: Secondary | ICD-10-CM | POA: Diagnosis present

## 2022-10-24 DIAGNOSIS — R55 Syncope and collapse: Secondary | ICD-10-CM | POA: Diagnosis present

## 2022-10-24 DIAGNOSIS — E876 Hypokalemia: Secondary | ICD-10-CM | POA: Diagnosis not present

## 2022-10-24 DIAGNOSIS — Z86718 Personal history of other venous thrombosis and embolism: Secondary | ICD-10-CM | POA: Insufficient documentation

## 2022-10-24 DIAGNOSIS — I110 Hypertensive heart disease with heart failure: Secondary | ICD-10-CM | POA: Insufficient documentation

## 2022-10-24 DIAGNOSIS — W19XXXA Unspecified fall, initial encounter: Secondary | ICD-10-CM

## 2022-10-24 DIAGNOSIS — R1031 Right lower quadrant pain: Secondary | ICD-10-CM | POA: Diagnosis not present

## 2022-10-24 DIAGNOSIS — N12 Tubulo-interstitial nephritis, not specified as acute or chronic: Secondary | ICD-10-CM | POA: Diagnosis not present

## 2022-10-24 DIAGNOSIS — I959 Hypotension, unspecified: Secondary | ICD-10-CM | POA: Insufficient documentation

## 2022-10-24 DIAGNOSIS — Z7901 Long term (current) use of anticoagulants: Secondary | ICD-10-CM | POA: Insufficient documentation

## 2022-10-24 DIAGNOSIS — Z950 Presence of cardiac pacemaker: Secondary | ICD-10-CM | POA: Insufficient documentation

## 2022-10-24 DIAGNOSIS — Z79899 Other long term (current) drug therapy: Secondary | ICD-10-CM | POA: Diagnosis not present

## 2022-10-24 DIAGNOSIS — G8929 Other chronic pain: Secondary | ICD-10-CM | POA: Diagnosis present

## 2022-10-24 DIAGNOSIS — J101 Influenza due to other identified influenza virus with other respiratory manifestations: Principal | ICD-10-CM | POA: Insufficient documentation

## 2022-10-24 LAB — URINALYSIS, ROUTINE W REFLEX MICROSCOPIC
Bilirubin Urine: NEGATIVE
Glucose, UA: NEGATIVE mg/dL
Ketones, ur: NEGATIVE mg/dL
Nitrite: NEGATIVE
Protein, ur: NEGATIVE mg/dL
Specific Gravity, Urine: 1.019 (ref 1.005–1.030)
WBC, UA: >50 WBC/hpf — ABNORMAL HIGH (ref 0–5)
pH: 5 (ref 5.0–8.0)

## 2022-10-24 LAB — COMPREHENSIVE METABOLIC PANEL
ALT: 19 U/L (ref 0–44)
AST: 20 U/L (ref 15–41)
Albumin: 3.7 g/dL (ref 3.5–5.0)
Alkaline Phosphatase: 61 U/L (ref 38–126)
Anion gap: 15 (ref 5–15)
BUN: 13 mg/dL (ref 6–20)
CO2: 26 mmol/L (ref 22–32)
Calcium: 9.1 mg/dL (ref 8.9–10.3)
Chloride: 99 mmol/L (ref 98–111)
Creatinine, Ser: 0.98 mg/dL (ref 0.44–1.00)
GFR, Estimated: >60 mL/min (ref >60)
Glucose, Bld: 107 mg/dL — ABNORMAL HIGH (ref 70–99)
Potassium: 3 mmol/L — ABNORMAL LOW (ref 3.5–5.1)
Sodium: 140 mmol/L (ref 135–145)
Total Bilirubin: 0.4 mg/dL (ref 0.3–1.2)
Total Protein: 6.2 g/dL — ABNORMAL LOW (ref 6.5–8.1)

## 2022-10-24 LAB — CBC WITH DIFFERENTIAL/PLATELET
Abs Immature Granulocytes: 0.02 10*3/uL (ref 0.00–0.07)
Basophils Absolute: 0 10*3/uL (ref 0.0–0.1)
Basophils Relative: 0 %
Eosinophils Absolute: 0 10*3/uL (ref 0.0–0.5)
Eosinophils Relative: 0 %
HCT: 34.5 % — ABNORMAL LOW (ref 36.0–46.0)
Hemoglobin: 11.7 g/dL — ABNORMAL LOW (ref 12.0–15.0)
Immature Granulocytes: 0 %
Lymphocytes Relative: 5 %
Lymphs Abs: 0.3 10*3/uL — ABNORMAL LOW (ref 0.7–4.0)
MCH: 28.5 pg (ref 26.0–34.0)
MCHC: 33.9 g/dL (ref 30.0–36.0)
MCV: 84.1 fL (ref 80.0–100.0)
Monocytes Absolute: 0.3 10*3/uL (ref 0.1–1.0)
Monocytes Relative: 4 %
Neutro Abs: 5.7 10*3/uL (ref 1.7–7.7)
Neutrophils Relative %: 91 %
Platelets: 128 10*3/uL — ABNORMAL LOW (ref 150–400)
RBC: 4.1 MIL/uL (ref 3.87–5.11)
RDW: 12.7 % (ref 11.5–15.5)
WBC: 6.3 10*3/uL (ref 4.0–10.5)
nRBC: 0 % (ref 0.0–0.2)

## 2022-10-24 LAB — LACTIC ACID, PLASMA
Lactic Acid, Venous: 1.3 mmol/L (ref 0.5–1.9)
Lactic Acid, Venous: 0.9 mmol/L (ref 0.5–1.9)

## 2022-10-24 LAB — I-STAT BETA HCG BLOOD, ED (MC, WL, AP ONLY): I-stat hCG, quantitative: <5 m[IU]/mL (ref <5)

## 2022-10-24 LAB — CBG MONITORING, ED: Glucose-Capillary: 105 mg/dL — ABNORMAL HIGH (ref 70–99)

## 2022-10-24 LAB — MAGNESIUM
Magnesium: 0.8 mg/dL — CL (ref 1.7–2.4)
Magnesium: 3.6 mg/dL — ABNORMAL HIGH (ref 1.7–2.4)

## 2022-10-24 LAB — BASIC METABOLIC PANEL
Anion gap: 10 (ref 5–15)
BUN: 11 mg/dL (ref 6–20)
CO2: 29 mmol/L (ref 22–32)
Calcium: 8.2 mg/dL — ABNORMAL LOW (ref 8.9–10.3)
Chloride: 104 mmol/L (ref 98–111)
Creatinine, Ser: 0.94 mg/dL (ref 0.44–1.00)
GFR, Estimated: >60 mL/min (ref >60)
Glucose, Bld: 122 mg/dL — ABNORMAL HIGH (ref 70–99)
Potassium: 3.2 mmol/L — ABNORMAL LOW (ref 3.5–5.1)
Sodium: 143 mmol/L (ref 135–145)

## 2022-10-24 LAB — RESP PANEL BY RT-PCR (FLU A&B, COVID) ARPGX2
Influenza A by PCR: POSITIVE — AB
Influenza B by PCR: NEGATIVE
SARS Coronavirus 2 by RT PCR: NEGATIVE

## 2022-10-24 LAB — TROPONIN I (HIGH SENSITIVITY)
Troponin I (High Sensitivity): 3 ng/L (ref <18)
Troponin I (High Sensitivity): 3 ng/L (ref <18)

## 2022-10-24 LAB — PROTIME-INR
INR: 1.5 — ABNORMAL HIGH (ref 0.8–1.2)
Prothrombin Time: 18.1 seconds — ABNORMAL HIGH (ref 11.4–15.2)

## 2022-10-24 LAB — APTT: aPTT: 41 seconds — ABNORMAL HIGH (ref 24–36)

## 2022-10-24 MED ORDER — BISACODYL 10 MG RE SUPP: 10.0000 mg | Freq: Every day | RECTAL | Status: DC | PRN

## 2022-10-24 MED ORDER — POTASSIUM CHLORIDE CRYS ER 20 MEQ PO TBCR
40.0000 meq | EXTENDED_RELEASE_TABLET | Freq: Once | ORAL | Status: AC
  Administered 2022-10-24: 40 meq via ORAL
  Filled 2022-10-24: qty 2

## 2022-10-24 MED ORDER — METHOCARBAMOL 500 MG PO TABS: 500.0000 mg | ORAL_TABLET | Freq: Four times a day (QID) | ORAL | Status: DC | PRN

## 2022-10-24 MED ORDER — OSELTAMIVIR PHOSPHATE 75 MG PO CAPS
75.0000 mg | ORAL_CAPSULE | Freq: Two times a day (BID) | ORAL | Status: DC
  Administered 2022-10-24 – 2022-10-26 (×5): 75 mg via ORAL
  Filled 2022-10-24 (×6): qty 1

## 2022-10-24 MED ORDER — SENNA 8.6 MG PO TABS
1.0000 | ORAL_TABLET | Freq: Two times a day (BID) | ORAL | Status: DC
  Filled 2022-10-24 (×5): qty 1

## 2022-10-24 MED ORDER — MAGNESIUM SULFATE 2 GM/50ML IV SOLN
2.0000 g | Freq: Once | INTRAVENOUS | Status: AC
  Administered 2022-10-24: 2 g via INTRAVENOUS
  Filled 2022-10-24: qty 50

## 2022-10-24 MED ORDER — LACTATED RINGERS IV BOLUS
1000.0000 mL | Freq: Once | INTRAVENOUS | Status: AC
  Administered 2022-10-24: 1000 mL via INTRAVENOUS

## 2022-10-24 MED ORDER — LACTATED RINGERS IV SOLN: INTRAVENOUS | Status: AC

## 2022-10-24 MED ORDER — ACETAMINOPHEN 650 MG RE SUPP: 650.0000 mg | Freq: Four times a day (QID) | RECTAL | Status: DC | PRN

## 2022-10-24 MED ORDER — SODIUM CHLORIDE 0.9 % IV SOLN: 2.0000 g | INTRAVENOUS | Status: DC

## 2022-10-24 MED ORDER — MAGNESIUM SULFATE 4 GM/100ML IV SOLN
4.0000 g | Freq: Once | INTRAVENOUS | Status: AC
  Administered 2022-10-24: 4 g via INTRAVENOUS
  Filled 2022-10-24: qty 100

## 2022-10-24 MED ORDER — MAGNESIUM OXIDE -MG SUPPLEMENT 400 (240 MG) MG PO TABS
400.0000 mg | ORAL_TABLET | Freq: Every day | ORAL | Status: DC
  Filled 2022-10-24 (×2): qty 1

## 2022-10-24 MED ORDER — FENTANYL CITRATE PF 50 MCG/ML IJ SOSY
50.0000 ug | PREFILLED_SYRINGE | Freq: Once | INTRAMUSCULAR | Status: DC
  Filled 2022-10-24: qty 1

## 2022-10-24 MED ORDER — PREGABALIN 75 MG PO CAPS
75.0000 mg | ORAL_CAPSULE | Freq: Four times a day (QID) | ORAL | Status: DC
  Administered 2022-10-24 (×4): 75 mg via ORAL
  Filled 2022-10-24 (×3): qty 3
  Filled 2022-10-24: qty 1

## 2022-10-24 MED ORDER — PREGABALIN 75 MG PO CAPS
150.0000 mg | ORAL_CAPSULE | Freq: Three times a day (TID) | ORAL | Status: DC
  Administered 2022-10-25 – 2022-10-26 (×2): 75 mg via ORAL
  Filled 2022-10-24 (×3): qty 2

## 2022-10-24 MED ORDER — APIXABAN 5 MG PO TABS
5.0000 mg | ORAL_TABLET | Freq: Two times a day (BID) | ORAL | Status: DC
  Administered 2022-10-24 – 2022-10-26 (×5): 5 mg via ORAL
  Filled 2022-10-24 (×5): qty 1

## 2022-10-24 MED ORDER — METHYLPHENIDATE HCL 5 MG PO TABS
20.0000 mg | ORAL_TABLET | Freq: Three times a day (TID) | ORAL | Status: DC
  Administered 2022-10-24 – 2022-10-26 (×7): 20 mg via ORAL
  Filled 2022-10-24 (×2): qty 4
  Filled 2022-10-24: qty 1
  Filled 2022-10-24 (×2): qty 4
  Filled 2022-10-24 (×2): qty 1
  Filled 2022-10-24: qty 4

## 2022-10-24 MED ORDER — FAMOTIDINE 20 MG PO TABS
40.0000 mg | ORAL_TABLET | Freq: Every day | ORAL | Status: DC | PRN
  Administered 2022-10-26: 40 mg via ORAL
  Filled 2022-10-24: qty 2

## 2022-10-24 MED ORDER — CIPROFLOXACIN IN D5W 400 MG/200ML IV SOLN
400.0000 mg | Freq: Once | INTRAVENOUS | Status: AC
  Administered 2022-10-24: 400 mg via INTRAVENOUS
  Filled 2022-10-24: qty 200

## 2022-10-24 MED ORDER — ACETAMINOPHEN 325 MG PO TABS
650.0000 mg | ORAL_TABLET | Freq: Four times a day (QID) | ORAL | Status: DC | PRN
  Administered 2022-10-25: 325 mg via ORAL
  Filled 2022-10-24: qty 2

## 2022-10-24 MED ORDER — SUCRALFATE 1 G PO TABS
1.0000 g | ORAL_TABLET | Freq: Four times a day (QID) | ORAL | Status: DC
  Administered 2022-10-24 – 2022-10-26 (×9): 1 g via ORAL
  Filled 2022-10-24 (×9): qty 1

## 2022-10-24 MED ORDER — ACETAMINOPHEN 500 MG PO TABS
1000.0000 mg | ORAL_TABLET | Freq: Once | ORAL | Status: AC
  Administered 2022-10-24: 1000 mg via ORAL
  Filled 2022-10-24: qty 2

## 2022-10-24 MED ORDER — FOSFOMYCIN TROMETHAMINE 3 G PO PACK
3.0000 g | PACK | Freq: Once | ORAL | Status: DC
  Filled 2022-10-24: qty 3

## 2022-10-24 NOTE — ED Triage Notes (Signed)
Pt brought to ED by [facility] for evaluation of possible syncopal episode and fall. Pt has hx of cerebral palsy and reports falling from wheelchair but does not remember event. Pt now endorses chest pain, back pain and weakness. Pt takes Eliquis. Initial BP 92/68.  EMS Interventions ASA 324mg    EMS Vitals BP 102/? HR 88 SPO2 98% RA CBG 92

## 2022-10-24 NOTE — ED Provider Notes (Signed)
[facility]   CSN: [id number] Arrival date & time: 10/24/22  0401     History  Chief Complaint  Patient presents with   Loss of Consciousness   Fall    [name] is a 41 y.o. female.  41 year old female with past medical history that is significant for COPD, chronic pain, CHF, hypertension, spastic cerebral palsy, POTS, history of hypomagnesemia and hypokalemia, DVT on Eliquis who presents today following a fall that occurred last night.  Patient states she was sitting on her wheelchair in the living room with her mom next to her when she fell.  She does not recall the event.  Her mother is at bedside and states that she does not believe the patient passed out or that she hit her head.  However she does add that it was dark and she is not entirely certain.  Patient stayed on the floor for about 20 minutes once a fire and EMS arrived to assist patient and bring her to the ED for further evaluation.  She also endorses left flank pain as well as mild dysuria which has been ongoing for the past couple days.  Her mom states she was treated for UTI about 2 weeks ago.  Mom is worried about patient's magnesium level and would like that checked.  She does report compliance with her Eliquis.  The history is provided by the patient. No language interpreter was used.       Home Medications Prior to Admission medications   Medication Sig Start Date End Date Taking? Authorizing Provider  ELIQUIS 5 MG TABS tablet Take 5 mg by mouth 2 (two) times daily. 08/12/22  Yes [provider]  famotidine (PEPCID) 40 MG tablet Take 40 mg by mouth daily as needed for heartburn. 03/25/22  Yes [provider]  methocarbamol (ROBAXIN) 500 MG tablet Take 500-1,000 mg by mouth every 6 (six) hours as needed (for back spasms).   Yes [provider]  methylphenidate (RITALIN) 20 MG tablet Take 20 mg by mouth 3 (three) times daily.   Yes  [provider]  metoprolol succinate (TOPROL-XL) 25 MG 24 hr tablet Take 25 mg by mouth daily. 01/26/22  Yes [provider]  sucralfate (CARAFATE) 1 g tablet Take 1 g by mouth 4 (four) times daily.   Yes [provider]  traMADol (ULTRAM) 50 MG tablet Take 50 mg by mouth every 6 (six) hours as needed for moderate pain. 09/20/13  Yes [provider]  ciprofloxacin (CIPRO) 500 MG tablet Take 500 mg by mouth every 12 (twelve) hours. Patient not taking: Reported on 10/24/2022    [provider]  potassium chloride SA (K-DUR,KLOR-CON) 10 MEQ tablet Take 1 tablet (10 mEq total) by mouth daily. Patient not taking: Reported on 11/23/2017 07/10/17   Harolyn RutherfordJoy, Shawn C, PA-C  pregabalin (LYRICA) 75 MG capsule Take 75 mg by mouth 4 (four) times daily.     [provider]      Allergies    Duloxetine, Flecainide, Fluoxetine, Gabapentin, Baclofen, Cephalosporins, Sulfasalazine, Sulfa antibiotics, and Vancomycin    Review of Systems   Review of Systems  Constitutional:  Negative for chills and fever.  Respiratory:  Negative for shortness of breath.   Cardiovascular:  Negative for chest pain.  Gastrointestinal:  Negative for abdominal pain, nausea and vomiting.  Genitourinary:  Positive for dysuria and flank pain.  Neurological:  Positive for syncope. Negative for headaches.  All other systems reviewed  and are negative.   Physical Exam Updated Vital Signs BP 105/73 (BP Location: Right Arm)   Pulse 86   Temp 99.9 F (37.7 C) (Oral)   Resp 16   LMP 06/09/2012   SpO2 100%  Physical Exam Vitals and nursing note reviewed.  Constitutional:      General: She is not in acute distress.    Appearance: Normal appearance. She is ill-appearing (Chronically).  HENT:     Head: Normocephalic and atraumatic.     Nose: Nose normal.  Eyes:     General: No scleral icterus.    Extraocular Movements: Extraocular movements intact.     Conjunctiva/sclera:  Conjunctivae normal.  Cardiovascular:     Rate and Rhythm: Normal rate and regular rhythm.     Pulses: Normal pulses.  Pulmonary:     Effort: Pulmonary effort is normal. No respiratory distress.     Breath sounds: Normal breath sounds. No wheezing or rales.  Abdominal:     General: There is no distension.     Palpations: Abdomen is soft.     Tenderness: There is no abdominal tenderness. There is left CVA tenderness. There is no right CVA tenderness or guarding.  Musculoskeletal:        General: Normal range of motion.     Cervical back: Normal range of motion.     Right lower leg: No edema.     Left lower leg: No edema.  Skin:    General: Skin is warm and dry.  Neurological:     General: No focal deficit present.     Mental Status: She is alert and oriented to person, place, and time. Mental status is at baseline.     ED Results / Procedures / Treatments   Labs (all labs ordered are listed, but only abnormal results are displayed) Labs Reviewed  CBC WITH DIFFERENTIAL/PLATELET - Abnormal; Notable for the following components:      Result Value   Hemoglobin 11.7 (*)    HCT 34.5 (*)    Platelets 128 (*)    Lymphs Abs 0.3 (*)    All other components within normal limits  COMPREHENSIVE METABOLIC PANEL - Abnormal; Notable for the following components:   Potassium 3.0 (*)    Glucose, Bld 107 (*)    Total Protein 6.2 (*)    All other components within normal limits  PROTIME-INR - Abnormal; Notable for the following components:   Prothrombin Time 18.1 (*)    INR 1.5 (*)    All other components within normal limits  APTT - Abnormal; Notable for the following components:   aPTT 41 (*)    All other components within normal limits  CBG MONITORING, ED - Abnormal; Notable for the following components:   Glucose-Capillary 105 (*)    All other components within normal limits  CULTURE, BLOOD (ROUTINE X 2)  CULTURE, BLOOD (ROUTINE X 2)  URINE CULTURE  LACTIC ACID, PLASMA  LACTIC  ACID, PLASMA  URINALYSIS, ROUTINE W REFLEX MICROSCOPIC  MAGNESIUM  I-STAT BETA HCG BLOOD, ED ([facility], [facility], [facility] ONLY)  TROPONIN I (HIGH SENSITIVITY)  TROPONIN I (HIGH SENSITIVITY)    EKG EKG Interpretation  Date/Time:  Sunday October 24 2022 04:03:05 EST Ventricular Rate:  85 PR Interval:  130 QRS Duration: 100 QT Interval:  362 QTC Calculation: 430 R Axis:   93 Text Interpretation: Atrial-paced rhythm Rightward axis Abnormal ECG When compared with ECG of 30-Aug-2021 01:38, PREVIOUS ECG IS PRESENT Confirmed by Marily Memos [phone]) on 10/24/2022  5:45:39 AM  Radiology DG Chest 2 View  Result Date: 10/24/2022 CLINICAL DATA:  41 year old female with chest pain, back pain. Cerebral palsy. EXAM: CHEST - 2 VIEW COMPARISON:  Portable chest 01/14/2021 and earlier. FINDINGS: Upright AP and lateral views at 0427 hours. Stable left chest cardiac pacemaker, right chest power port and additional abdominal stimulator device with cardiac leads. No cardiomegaly. Mediastinal contours are stable since 2018 and within normal limits. Mildly lower lung volumes. No pneumothorax, pulmonary edema, pleural effusion or confluent pulmonary opacity. Visualized tracheal air column is within normal limits. Stable visualized osseous structures. Negative visible bowel gas. Cholecystectomy clips might be new since last year, uncertain. IMPRESSION: No acute cardiopulmonary abnormality. Electronically Signed   By: Odessa Fleming [name]   On: 10/24/2022 05:26    Procedures Procedures    Medications Ordered in ED Medications - No data to display  ED Course/ Medical Decision Making/ A&P Clinical Course as of 10/24/22 6712  [facility] Oct 24, 2022  0941 Potassium(!): 3.0 [JL]  4580 Magnesium(!!): 0.8 [JL]    Clinical Course User Index [JL] Ernie Avena, MD                           Medical Decision Making Amount and/or Complexity of Data Reviewed Labs: ordered. Decision-making details documented in ED Course.  Risk OTC  drugs. Prescription drug management.   Medical Decision Making / ED Course   This patient presents to the ED for concern of fall, syncopal episode, this involves an extensive number of treatment options, and is a complaint that carries with it a high risk of complications and morbidity.  The differential diagnosis includes Loss of consciousness secondary to fall, arrhythmia, ACS, PE  MDM: 41 year old female with past medical history as noted above presents today for questionable syncopal episode.  Mom is reluctant to admit that patient likely had a syncopal episode.  However she states it was dark and she is unsure.  Patient states she does not recall the fall and believes she likely had a syncopal episode.  She denies headache.  CBC is without leukocytosis, hemoglobin of 11.7 which is just slightly decreased from recent of 12.0 at [facility] ED, CMP with preserved renal function, glucose of 107, potassium 3.0.  Recent labs at [facility] showed similar hypokalemia.  We will add on magnesium per mom's request.  Initial troponin of 3.  Lactic acid of 1.3.  Chest x-ray without evidence of acute cardiopulmonary process.  EKG without acute ischemic changes.  During initial labs blood cultures were ordered which are currently pending.  UA is ordered but has not been collected yet.  Given her dysuria and flank pain we will collect a urine to ensure there is no active UTI.  Patient at the end of my shift signed out to attending Dr. Karene Fry.  she is hypomagnesemic at 0.8.  Magnesium supplementation ordered.  CT head without acute intracranial finding.  UA still pending.  Disposition per attending.    Additional history obtained: -Additional history obtained from recent labs at [facility] as well as recently ER visits there. -External records from outside source obtained and reviewed including: Chart review including previous notes, labs, imaging, consultation notes   Lab Tests: -I ordered, reviewed, and interpreted labs.    The pertinent results include:   Labs Reviewed  CBC WITH DIFFERENTIAL/PLATELET - Abnormal; Notable for the following components:      Result Value   Hemoglobin 11.7 (*)    HCT  34.5 (*)    Platelets 128 (*)    Lymphs Abs 0.3 (*)    All other components within normal limits  COMPREHENSIVE METABOLIC PANEL - Abnormal; Notable for the following components:   Potassium 3.0 (*)    Glucose, Bld 107 (*)    Total Protein 6.2 (*)    All other components within normal limits  PROTIME-INR - Abnormal; Notable for the following components:   Prothrombin Time 18.1 (*)    INR 1.5 (*)    All other components within normal limits  APTT - Abnormal; Notable for the following components:   aPTT 41 (*)    All other components within normal limits  CBG MONITORING, ED - Abnormal; Notable for the following components:   Glucose-Capillary 105 (*)    All other components within normal limits  CULTURE, BLOOD (ROUTINE X 2)  CULTURE, BLOOD (ROUTINE X 2)  URINE CULTURE  LACTIC ACID, PLASMA  LACTIC ACID, PLASMA  URINALYSIS, ROUTINE W REFLEX MICROSCOPIC  MAGNESIUM  I-STAT BETA HCG BLOOD, ED ([facility], [facility], [facility] ONLY)  TROPONIN I (HIGH SENSITIVITY)  TROPONIN I (HIGH SENSITIVITY)      EKG  EKG Interpretation  Date/Time:  Sunday October 24 2022 04:03:05 EST Ventricular Rate:  85 PR Interval:  130 QRS Duration: 100 QT Interval:  362 QTC Calculation: 430 R Axis:   93 Text Interpretation: Atrial-paced rhythm Rightward axis Abnormal ECG When compared with ECG of 30-Aug-2021 01:38, PREVIOUS ECG IS PRESENT Confirmed by Marily Memos [phone]) on 10/24/2022 5:45:39 AM         Imaging Studies ordered: I ordered imaging studies including chest x-ray, CT head I independently visualized and interpreted imaging. I agree with the radiologist interpretation   Medicines ordered and prescription drug management: No orders of the defined types were placed in this encounter.   -I have reviewed the patients home  medicines and have made adjustments as needed  Reevaluation: After the interventions noted above, I reevaluated the patient and found that they have :stayed the same  Co morbidities that complicate the patient evaluation  Past Medical History:  Diagnosis Date   Carpal tunnel syndrome    Cerebral palsy (HCC)    Chronic back pain    Gitelman syndrome    Hypokalemia    now resolved   Magnesium deficiency    Pacemaker    PONV (postoperative nausea and vomiting)    Postural orthostatic tachycardia syndrome    s/p pacemaker placement.   RSD (reflex sympathetic dystrophy)    right foot,    RSD (reflex sympathetic dystrophy)    SA node dysfunction (HCC) 2008      Dispostion: Patient edema shift signed out to attending Dr. Karene Fry we will follow-up on labs and dispo appropriately.  Final Clinical Impression(s) / ED Diagnoses Final diagnoses:  Fall, initial encounter  Syncope, unspecified syncope type  Hypomagnesemia  Hypokalemia    Rx / DC Orders ED Discharge Orders     None         Marita Kansas, PA-C 10/24/22 0950    Ernie Avena, MD 10/24/22 1104    Ernie Avena, MD 10/24/22 1243

## 2022-10-24 NOTE — H&P (Cosign Needed Addendum)
Date: 10/24/2022               Patient Name:  [name] MRN: [id number]  DOB: 1981-08-31 Age / Sex: 41 y.o., female   PCP: [facility]         Medical Service: Internal Medicine Teaching Service         Attending Physician: Dr. Dickie La, MD    First Contact: Dr. Adron Bene  Pager: [phone]  Second Contact: Dr. Champ Mungo  Pager: [phone]       After Hours (After 5p/  First Contact Pager: [phone]  weekends / holidays): Second Contact Pager: [phone]   Chief Complaint: Flank pain/GU symptoms, possible syncopal event   History of Present Illness:   [name] is a 15 y F with a PMH of spastic, cerebral palsy, SA node dysfxn s/p PPM, POTS, DVT on eliquis, and HF who presents for GU symptoms w/ flank pain R>L, and possible syncopal episode earlier today.     History provided by patient and her mother who is at bedside.   Patient has been having flank pain for about 3-4 days now. She has also been having burning with urination, feeling hot and cold, decreased appetite, and malaise during this time. She denies dyspnea or chest pain, notes mild HA, no changes in vision, chronic constipation but BM yesterday, and no leg swelling.   Patient also reports that she "passed out" the day prior to admission. She states she was sitting in her wheelchair and slid out of her wheel chair on to the floor. She denies any preceding symptoms including palpitations or diaphoresis. She also denies any preceding events like cough, defecation, or micturition. Per her mother, it did not appear that the patient lost consciousness and that the patient was communicative throughout the entire event.  The patient's mother also states that it is possible she drifted to sleep briefly as she has been more tired recently. Finally, patient is not able to ambulate but can stand briefly and has a history of POTS with no previous syncopal episode.   She was evaluated for similar urinary  complaints 11/3 at [facility] ED. She had malaise and pain with urination. She also noted a change in the smell of her urine. Urine cultures obtained at that time were negative. Patient was discharged on 7d course of ciprofloxacin  which ended 11/11.   Of note, patient has also had an additional ED visit 11/9 for R ankle pain with concern for DVT.   In the ED, she was noted to have flank pain, fever up to 102.30F. She had no leukocytosis but lymphopenia. She had a CT head negative for acute intracranial abnormality. A CT renal stone protocol negative for renal stone. She was started on IV ciprofloxacin for possible pyelonephritis, her magnesium was 0.8, and her K was 3.0, both were repleted.   Meds:  Current Meds  Medication Sig   ELIQUIS 5 MG TABS tablet Take 5 mg by mouth 2 (two) times daily.   famotidine (PEPCID) 40 MG tablet Take 40 mg by mouth daily as needed for heartburn.   methocarbamol (ROBAXIN) 500 MG tablet Take 500-1,000 mg by mouth every 6 (six) hours as needed (for back spasms).   methylphenidate (RITALIN) 20 MG tablet Take 20 mg by mouth 3 (three) times daily.   metoprolol succinate (TOPROL-XL) 25 MG 24 hr tablet Take 25 mg by mouth daily.   sucralfate (CARAFATE) 1 g tablet Take  1 g by mouth 4 (four) times daily.   traMADol (ULTRAM) 50 MG tablet Take 50 mg by mouth every 6 (six) hours as needed for moderate pain.     Allergies: Allergies as of 10/24/2022 - Review Complete 10/24/2022  Allergen Reaction Noted   Duloxetine  09/25/2011   Flecainide  09/25/2011   Fluoxetine  09/25/2011   Gabapentin  09/25/2011   Baclofen  05/26/2015   Cephalosporins  09/25/2011   Sulfasalazine  11/28/2011   Sulfa antibiotics Nausea And Vomiting 11/28/2011   Vancomycin  08/21/2018   Past Medical History:  Diagnosis Date   Carpal tunnel syndrome    Cerebral palsy (HCC)    Chronic back pain    Gitelman syndrome    Hypokalemia    now resolved   Magnesium deficiency    Pacemaker     PONV (postoperative nausea and vomiting)    Postural orthostatic tachycardia syndrome    s/p pacemaker placement.   RSD (reflex sympathetic dystrophy)    right foot,    RSD (reflex sympathetic dystrophy)    SA node dysfunction (HCC) 2008    Family History:   Family History  Problem Relation Age of Onset   Diabetes Mother    Hypertension Mother    Social History:   Social History   Tobacco Use   Smoking status: Never   Smokeless tobacco: Never  Substance Use Topics   Alcohol use: No    Alcohol/week: 0.0 standard drinks of alcohol   Drug use: No     Review of Systems: A complete ROS was negative except as per HPI.   Physical Exam: Blood pressure (!) 82/51, pulse 85, temperature 98.4 F (36.9 C), temperature source Oral, resp. rate 20, last menstrual period 06/09/2012, SpO2 99 %.   Constitutional: Chronically ill appearing, frail, and in no distress.  HENT:  Head: Normocephalic and atraumatic.  Cardiovascular: Normal rate, regular rhythm, intact distal pulses. No gallop and no friction rub.  No murmur heard. No lower extremity edema  Pulmonary: Non labored breathing on room air, no wheezing or rales  Abdominal: Soft. Normal bowel sounds. Non distended and non tender Musculoskeletal: Normal range of motion.        General: No tenderness or edema.  Neurological: Alert and oriented to person, place, and time.  Skin: Skin is warm and dry.    EKG: personally reviewed my interpretation is atrial paced   CXR: personally reviewed my interpretation is no acute abnormality.   Assessment & Plan by Problem: Principal Problem:   Pyelonephritis [name] is a 59 y F with a PMH of spastic, cerebral palsy, SA node dysfxn s/p PPM, POTS, and DVT on eliquis who presents for GU symptoms w/ flank pain R>L, and possible syncopal episode earlier today found to have possible pyelonephritis and to be influenza positive.   #?Pyelonephritis  Patient with flank pain, dysuria, and  UA with moderate leukocytes and rare bacteria concerning for possible pyelonephritis. A CT renal protocol was obtained which showed no stone. Patient was recently treated for UTI 11/4-11/11 with a 7d course of ciprofloxacin which she completed. A Ucx  obtained 11/3 was negative (<10k CFU). It is possible that she has worsening infection since she did not have flank pain when she presented to the ED almost a week ago. She continues to have dysuria. Patient was noted to be febrile this admission however with no leukocytosis, only lymphopenia. She received IV ciprofloxacin here in the ED.  -Will start for 7d course  of antibiotics, CTX while inpatient, follow up Ucx  #Flu A + Patient noted to have fever to 102.54F. Her influenza infection could also be contributing to her fever. In addition she has a leukopenia. She has some malaise and decreased appetite, but no respiratory symptoms. Although she does not currently have severe symptoms, given her multiple comorbidities will initiate treatment for her influenza.  -Tamiflu   #?Syncopal episode  Patient is atrially paced though it is unclear when her device was last interrogated and this does not preclude arrhythmia as potential etiology for her event. She does have POTS but states she did not change positions, she was sitting in her chair only. This also makes orthostatic hypotension less likely. She notes no prodromal symptoms concerning for vasovagal syncope. There is low concern for PE as she is on therapeutic anticoagulation for previous DVT. Patient also notes that it is possible that she drifted to sleep during this time, will follow up rest of work up.  -Will f/u telemetry  -PT  #Hypomagnesemia  #Hypokalemia  Unclear etiology of her severe hypomagnesemia. This appears to be a chronic issue for patient, with notes back to 2012 mentioning this. She also has a diagnosis of Gitelman syndrome listed on the chart but does not have a metabolic alkalosis. She  does look chronically malnourished but her albumin is normal. She has no diarrhea. She is not on a diuretic. She does not drink alcohol.  -She received IV mag4g in the ED, wil give patient another 4g -Patient is s/p of potassium in the ED as well -F/u BMP and Mag level at 1800  #DVT Happened about 3 years ago. Unclear if provoked or unprovoked however she is chronically immobile. Per her mother, her cardiologist would like her on chronic AC.  -Continue home eliquis   Hold home metoprolol   Dispo: Admit patient to Observation with expected length of stay less than 2 midnights.  Signed: Marolyn Haller, MD 10/24/2022, 2:16 PM  After 5pm on weekdays and 1pm on weekends: On Call pager: [phone]

## 2022-10-24 NOTE — Hospital Course (Addendum)
Principal Problem:   Influenza Active Problems:   Hypomagnesemia   Back pain   Abdominal pain, chronic, right lower quadrant  Resolved Problems:   Pyelonephritis  Mom is unhappy, tapanga says she is not being treated well. She says she doesn't even think she has the flu.

## 2022-10-24 NOTE — ED Provider Triage Note (Signed)
  Emergency Medicine Provider Triage Evaluation Note  MRN:  [id number]  Arrival date & time: 10/24/22    Medically screening exam initiated at 4:08 AM.   CC:   Loss of Consciousness and Fall   HPI:  [name] is a 41 y.o. year-old female presents to the ED with chief complaint of syncope.  Larey Seat out of wheel chair.  Denies hitting head.  Denies headache.  Complains of some chest pain.  Anticoagulated on Eliquis.  History provided by patient. ROS:  -As included in HPI PE:   Vitals:   10/24/22 0401  BP: 105/73  Pulse: 86  Resp: 16  Temp: 99.9 F (37.7 C)  SpO2: 100%    Non-toxic appearing No respiratory distress No evidence of head trauma MDM:  Based on signs and symptoms, syncope is highest on my differential. I've ordered labs and imaging in triage to expedite lab/diagnostic workup.  No sign of head trauma.  Patient denies hitting head.    Patient was informed that the remainder of the evaluation will be completed by another provider, this initial triage assessment does not replace that evaluation, and the importance of remaining in the ED until their evaluation is complete.    Roxy Horseman, PA-C 10/24/22 [phone]

## 2022-10-24 NOTE — ED Notes (Addendum)
Offgoing RN notified this RN that pt's bed has been rejected d/t hypotension. Internal Medicine paged related to patient vital signs. New orders given. Pt placement notified of the same.

## 2022-10-25 DIAGNOSIS — J111 Influenza due to unidentified influenza virus with other respiratory manifestations: Secondary | ICD-10-CM | POA: Diagnosis not present

## 2022-10-25 DIAGNOSIS — I959 Hypotension, unspecified: Secondary | ICD-10-CM

## 2022-10-25 LAB — CBC WITH DIFFERENTIAL/PLATELET
Abs Immature Granulocytes: 0.01 10*3/uL (ref 0.00–0.07)
Basophils Absolute: 0 10*3/uL (ref 0.0–0.1)
Basophils Relative: 1 %
Eosinophils Absolute: 0 10*3/uL (ref 0.0–0.5)
Eosinophils Relative: 0 %
HCT: 30.7 % — ABNORMAL LOW (ref 36.0–46.0)
Hemoglobin: 10.4 g/dL — ABNORMAL LOW (ref 12.0–15.0)
Immature Granulocytes: 0 %
Lymphocytes Relative: 19 %
Lymphs Abs: 0.6 10*3/uL — ABNORMAL LOW (ref 0.7–4.0)
MCH: 28.8 pg (ref 26.0–34.0)
MCHC: 33.9 g/dL (ref 30.0–36.0)
MCV: 85 fL (ref 80.0–100.0)
Monocytes Absolute: 0.3 10*3/uL (ref 0.1–1.0)
Monocytes Relative: 10 %
Neutro Abs: 2.2 10*3/uL (ref 1.7–7.7)
Neutrophils Relative %: 70 %
Platelets: 108 10*3/uL — ABNORMAL LOW (ref 150–400)
RBC: 3.61 MIL/uL — ABNORMAL LOW (ref 3.87–5.11)
RDW: 12.8 % (ref 11.5–15.5)
WBC: 3.2 10*3/uL — ABNORMAL LOW (ref 4.0–10.5)
nRBC: 0 % (ref 0.0–0.2)

## 2022-10-25 LAB — COMPREHENSIVE METABOLIC PANEL
ALT: 36 U/L (ref 0–44)
AST: 54 U/L — ABNORMAL HIGH (ref 15–41)
Albumin: 3.1 g/dL — ABNORMAL LOW (ref 3.5–5.0)
Alkaline Phosphatase: 57 U/L (ref 38–126)
Anion gap: 10 (ref 5–15)
BUN: 9 mg/dL (ref 6–20)
CO2: 25 mmol/L (ref 22–32)
Calcium: 7.8 mg/dL — ABNORMAL LOW (ref 8.9–10.3)
Chloride: 101 mmol/L (ref 98–111)
Creatinine, Ser: 0.85 mg/dL (ref 0.44–1.00)
GFR, Estimated: >60 mL/min (ref >60)
Glucose, Bld: 103 mg/dL — ABNORMAL HIGH (ref 70–99)
Potassium: 3 mmol/L — ABNORMAL LOW (ref 3.5–5.1)
Sodium: 136 mmol/L (ref 135–145)
Total Bilirubin: 0.4 mg/dL (ref 0.3–1.2)
Total Protein: 5.3 g/dL — ABNORMAL LOW (ref 6.5–8.1)

## 2022-10-25 LAB — URINE CULTURE

## 2022-10-25 LAB — MAGNESIUM: Magnesium: 1.9 mg/dL (ref 1.7–2.4)

## 2022-10-25 MED ORDER — ORAL CARE MOUTH RINSE: 15.0000 mL | OROMUCOSAL | Status: DC | PRN

## 2022-10-25 MED ORDER — POTASSIUM CHLORIDE CRYS ER 20 MEQ PO TBCR
40.0000 meq | EXTENDED_RELEASE_TABLET | Freq: Two times a day (BID) | ORAL | Status: DC
  Administered 2022-10-25 – 2022-10-26 (×3): 40 meq via ORAL
  Filled 2022-10-25 (×3): qty 2

## 2022-10-25 NOTE — Discharge Summary (Incomplete)
Name: [name] MRN: [id number] DOB: [birth year] 41 y.o. PCP: [facility]  Date of Admission: 10/24/2022  4:01 AM Date of Discharge: 10/26/2022 11:15 AM Attending Physician: Dickie La, MD  Discharge Diagnosis: Principal Problem:   Influenza Active Problems:   Hypomagnesemia   Back pain   Abdominal pain, chronic, right lower quadrant   Discharge Medications: Allergies as of 10/26/2022       Reactions   Duloxetine    Other reaction(s): Other (See Comments), Palpitations Other reaction(s): Hallucinations, Hyperactive behavior (finding), Other (See Comments) Stressed out   Flecainide    Other reaction(s): Other (See Comments) Other reaction(s): Hypomagnesemia (disorder), Other (See Comments) Other Reaction: QUESTION HYPOMAGNESEMIA   Fluoxetine    Other reaction(s): Nausea And Vomiting, Other (See Comments) Other reaction(s): Other (See Comments) Other Reaction: INCREASED HR  causes irritability and irrational behavior "I go crazy"   Gabapentin    Goes crazy Other reaction(s): Other (See Comments) Other reaction(s): Hyperactive behavior (finding), Mental Status Changes (intolerance), Other (See Comments) Goes crazy Hallucinations   Baclofen    Other reaction(s): Other (See Comments), Other (See Comments) Weakness  Other reaction(s): Other (See Comments) Weakness and loose Weakness  Other reaction(s):  Weakness    Cephalosporins    Other reaction(s): Hives, Rash Received pre-op vancomycin and cefazolin 07/20/16. Developed urticarial rash on bilateral lower extremities + slight periorbital swelling. Treated with diphenhydramine. Received pre-op vancomycin and cefazolin 07/20/16. Developed urticarial rash on bilateral lower extremities + slight periorbital swelling. Treated with diphenhydramine. Received pre-op vancomycin and cefazolin 07/20/16. Developed urticarial rash on bilateral lower extremities + slight periorbital swelling. Treated with  diphenhydramine.   Sulfasalazine    Other reaction(s): Nausea And Vomiting   Sulfa Antibiotics Nausea And Vomiting   Vancomycin    Other reaction(s): Hives Received pre-op vancomycin and cefazolin 07/20/16. Developed urticarial rash on bilateral lower extremities + slight periorbital swelling. Treated with diphenhydramine. Received pre-op vancomycin and cefazolin 07/20/16. Developed urticarial rash on bilateral lower extremities + slight periorbital swelling. Treated with diphenhydramine. Received pre-op vancomycin and cefazolin 07/20/16. Developed urticarial rash on bilateral lower extremities + slight periorbital swelling. Treated with diphenhydramine.        Medication List     STOP taking these medications    ciprofloxacin 500 MG tablet Commonly known as: CIPRO       TAKE these medications    Eliquis 5 MG Tabs tablet Generic drug: apixaban Take 5 mg by mouth 2 (two) times daily.   famotidine 40 MG tablet Commonly known as: PEPCID Take 40 mg by mouth daily as needed for heartburn.   magnesium oxide 400 (240 Mg) MG tablet Commonly known as: MAG-OX Take 1 tablet (400 mg total) by mouth daily. Start taking on: October 27, 2022   methocarbamol 500 MG tablet Commonly known as: ROBAXIN Take 500-1,000 mg by mouth every 6 (six) hours as needed (for back spasms).   metoprolol succinate 25 MG 24 hr tablet Commonly known as: TOPROL-XL Take 25 mg by mouth daily.   oseltamivir 75 MG capsule Commonly known as: TAMIFLU Take 1 capsule (75 mg total) by mouth 2 (two) times daily for 3 days.   potassium chloride 10 MEQ tablet Commonly known as: KLOR-CON M Take 1 tablet (10 mEq total) by mouth daily.   pregabalin 75 MG capsule Commonly known as: LYRICA Take 75 mg by mouth 4 (four) times daily.   Ritalin 20 MG tablet Generic drug: methylphenidate Take 20 mg by mouth 3 (three) times daily.  sucralfate 1 g tablet Commonly known as: CARAFATE Take 1 g by mouth 4 (four) times  daily.   traMADol 50 MG tablet Commonly known as: ULTRAM Take 50 mg by mouth every 6 (six) hours as needed for moderate pain.        Disposition and follow-up:   [name] is a 41 y.o. year old living with cerebral palsy and chronic pelvic pain who presented with fevers, right-sided low back pain, and fainting, initially thought to be due to complicated UTI, but also found to be influenza positive. After workup, fever and pre-syncope are felt most likely to be due to flu and orthostatic hypotension. Etiology of right low back pain is musculoskeletal versus chronic pelvic origin.  Influenza Tested positive for influenza A. Treated with supportive therapy and oseltamivir. She has been instructed to continue oseltamivir until 10/29/22.  Right low back pain Right low abdominal pain Musculoskeletal versus pelvic in origin. Recommend following up with urogyn at [facility], which has been seeing her for bladder spasms and pelvic pain s/p pelvic surgery.  Pending labs/ test needing follow-up: Blood cultures show no growth at 2 days at time of discharge.  Follow-up Appointments:  Follow-up Information     [place]. Call today.   Contact information: [address] [place] [address] [phone]         Steva Colder, MD. Go on 10/28/2022.   Specialty: Obstetrics and Gynecology Why: Please arrive by 1:30 for your appointment at 1:45 PM Contact information: [address] [place] [address] [phone]                 Hospital Course by problem list: Principal Problem:   Influenza Active Problems:   Hypomagnesemia   Back pain   Abdominal pain, chronic, right lower quadrant  Resolved Problems:   Pyelonephritis  Influenza Fever Hypotension Patient's initial presentation with fevers and syncope are felt most likely to be explained by flu and orthostatic hypotension rather than bacterial pyelonephritis.  She had no  respiratory symptoms.  Physical exam and chest x-ray were not suggestive of pneumonia.  She received supportive therapy including IV fluids and oseltamivir.  Her condition is stable.  She requires no oxygen.   Right-sided low back pain Right lower quadrant abdominal pain Initial concern was for pyelonephritis based on patient's clinical presentation with fever and hypotension in setting of recent history of UTI. On further review of patient's history, she has chronic pelvic pain and bladder spasms that have been an issue for her for years, and possibly worsened since August 2023 R oophorectomy to remove ovarian cyst. Prior to that this patient had a bilateral salpingectomy and left oophorectomy in addition to hysterectomy for presumed endometriosis.  Since her surgery in August, she has sought treatment in the ED several times for dysuria, with repeat urine cultures showing colonization by mixed flora but no obvious culprit pathogen.  During this admission, there was no microbiologic or imaging evidence of pyelonephritis. Although CT renal study is limited for evaluation of pyelonephritis given lack of contrast, our examination and other clinical findings are less suspicious for complicated UTI.  Moreover prior to presentation at this ED she was treated with a 7-day course of ciprofloxacin for similar symptoms, course ending 11/11.  She follows with urogynecology at [facility] for pelvic and bladder pain.  I recommend that she follow-up with them after she leaves the hospital, appointment scheduled 10/28/22.  History of right upper extremity DVT  Takes Eliquis outside of hospital. 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 Eliquis was continued for admission.  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 These electrolytes were replaced. No related complications  during admission.  Discharge Exam:  Subjective: reports some indigestion and nausea. Otherwise no new symptoms since yesterday.   Blood pressure 113/78, pulse 87, temperature (!) 97.5 F (36.4 C), temperature source Oral, resp. rate 18, height 5\' 2"  (1.575 m), weight 42 kg, last menstrual period 06/09/2012, SpO2 100 %.  General: No apparent acute distress. Cardiovascular: Regular rate and rhythm.  Right radial pulse normal. Pulmonary: Breathing is regular and unlabored.  Lungs clear to auscultation. Abdominal: Soft and nondistended. Musculoskeletal: Right lower paraspinal tenderness to palpation. Extremities: No pedal edema. Neurologic: No gross focal deficits. Psychiatric: Pleasant.  Appropriate mood and affect.  Pertinent studies and procedures:  11/12 Blood cultures no growth x2 days - PENDING at discharge 11/12 Urine culture polymicrobial Lactate WNL Influenza A PCR positive  CT renal stone study IMPRESSION: 1. Limited exam due to lack of contrast, limited peritoneal/retroperitoneal fat and relatively dense residual contrast mixed with stool in the colon. There is also shadowing from a right anterior upper abdomen implanted device consistent with a defibrillator, stable from the prior CT. 2. Allowing for the limitations, there is no convincing acute abnormality. No visualized ureteral stone and no hydronephrosis. 3. Bilateral intrarenal stones. 4. Mild to moderate increase in the colonic stool burden. No convincing bowel inflammation.  Discharge Instructions:   Discharge Instructions      Ms. SHAYLIE EKLUND  You were admitted for fevers and hypotension and treated for influenza. It was initially thought that you had a UTI, but the tests and imaging performed don't show evidence of that kind of infection. Blood cultures, a test to detect bacteria in your bloodstream, were obtained. The initial results are not suggestive of a bloodstream infection, but we will follow  those blood tests for a total of 5 days to see if there is any bacterial growth. We are discharging you home now that you are doing better. To help assist you on your road to recovery, I have written the following recommendations:   Stop taking antibiotics, because we don't suspect your illness is caused by a bacterial infection.  Continue taking oseltamivir (Tamiflu) until November 17.  Please make an appointment to see your primary care physician, or any doctor whom you trust, as soon as possible after you leave the hospital.  It was a privilege to be a part of your hospital care team, and I hope you feel better as a result of your stay.  All the best, November 19, MD     Signed: Marrianne Mood MD 10/26/2022, 11:15 AM   Pager: [phone]

## 2022-10-25 NOTE — Progress Notes (Signed)
   10/25/22 1958  Provider Notification  Provider Name/Title Dr Nooruddin  Date Provider Notified 10/25/22  Time Provider Notified 1959  Method of Notification Page  Notification Reason Requested by patient/family (pt mum wants to speak with on call provider)

## 2022-10-25 NOTE — Discharge Instructions (Signed)
[name]  You were admitted for fevers and hypotension and treated for influenza. It was initially thought that you had a UTI, but the tests and imaging performed don't show evidence of that kind of infection. Blood cultures, a test to detect bacteria in your bloodstream, were obtained. The initial results are not suggestive of a bloodstream infection, but we will follow those blood tests for a total of 5 days to see if there is any bacterial growth. We are discharging you home now that you are doing better. To help assist you on your road to recovery, I have written the following recommendations:   Stop taking antibiotics, because we don't suspect your illness is caused by a bacterial infection.  Continue taking oseltamivir (Tamiflu) until November 17.  Please make an appointment to see your primary care physician, or any doctor whom you trust, as soon as possible after you leave the hospital.  It was a privilege to be a part of your hospital care team, and I hope you feel better as a result of your stay.  All the best, Marrianne Mood, MD

## 2022-10-25 NOTE — Progress Notes (Signed)
  Transition of Care [facility]) Screening Note   Patient Details  Name: [name] Date of Birth: 04-26-1981   Transition of Care [facility]) CM/SW Contact:    Harriet Masson, RN Phone Number: 10/25/2022, 1:52 PM    Transition of Care Department [facility]) has reviewed patient and no TOC needs have been identified at this time. We will continue to monitor patient advancement through interdisciplinary progression rounds. If new patient transition needs arise, please place a TOC consult.

## 2022-10-25 NOTE — Progress Notes (Addendum)
Interval history Feels weak.  Complains of right lower back pain that is different than her usual chronic low back pain. Reports that her bladder symptoms have been ongoing for several weeks to months, since surgery to remove right ovarian cyst.  Physical exam Blood pressure 114/70, pulse 86, temperature 98.2 F (36.8 C), temperature source Oral, resp. rate 16, height 5\' 2"  (1.575 m), weight 42 kg, last menstrual period 06/09/2012, SpO2 99 %.  General: No apparent acute distress. Cardiovascular: Regular rate and rhythm.  Right radial pulse normal. Pulmonary: Breathing is regular and unlabored.  Lungs clear to auscultation. Abdominal: Soft and nondistended. Musculoskeletal: Right lower paraspinal tenderness to palpation. Extremities: No pedal edema. Neurologic: No gross focal deficits. Psychiatric: Pleasant.  Appropriate mood and affect.   Intake/Output Summary (Last 24 hours) at 10/25/2022 1122 Last data filed at 10/25/2022 10/27/2022 Gross per 24 hour  Intake 1808.8 ml  Output 350 ml  Net 1458.8 ml    Labs K: 3.0 WBCs: 3.2 Hemoglobin: 10.4 Blood cultures no growth at 1 day Urine culture from in/out cath with multiple species present  Images and other studies CT renal stone protocol: Bilateral renal stones.  No ureteral stones or hydronephrosis. CT head: No acute intracranial abnormality. CXR: No pulmonary infiltrates or pleural effusions.  Assessment and plan Hospital day 0  [name] is a 41 y.o. with cerebral palsy, chronic pelvic pain status post bilateral salpingo oophorectomy and hysterectomy, and recent history notable for treatment of presumed UTI who presents with flank pain, fevers and syncope, initially concerning for pyelonephritis but without evidence of same, incidentally found to have influenza which is thought to explain her overall clinical syndrome.  Influenza Fever Hypotension Patient's initial presentation with fevers and syncope  are likely to be explained by flu and orthostatic hypotension rather than bacterial pyelonephritis.  She has no respiratory symptoms.  Physical exam and chest x-ray are not suggestive of pneumonia.  She has received good supportive therapy up to this point including IV fluids and oseltamivir.  Her condition is currently stable.  She requires no oxygen.  It would be appropriate for her to continue convalescing at home. - Continue oseltamivir 75 mg twice daily until 10/29/2022. - Discontinue antibiotics  Right flank pain Right-sided abdominal pain Based on history and physical exam I suspect that this is more likely due to this patient's chronic pelvic pain syndrome rather than pyelonephritis.  Her history is notable for painful bladder spasms since right oophorectomy in August 2023.  Oophorectomy was performed for removal of a hemorrhagic cyst.  Prior to that this patient had a bilateral salpingectomy and left oophorectomy in addition to hysterectomy for presumed endometriosis.  Since her surgery in August, she has sought treatment in the ED several times for dysuria, with repeat urine cultures showing colonization by mixed flora but no obvious culprit pathogen.  This admission, no microbiological or imaging evidence of pyelonephritis. Although CT renal study is limited for evaluation of pyelonephritis given lack of contrast, our examination and other clinical findings are less suspicious for complicated UTI.  Moreover prior to presentation at this ED she was treated with a 7-day course of ciprofloxacin for similar symptoms, course ending 11/11.  She follows with urogynecology at [facility] for pelvic and bladder pain.  I recommend that she follow-up with them after she leaves the hospital. 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  She states that she is able to take care of herself without her mom support.  Attempting to call mom for help with discharge planning.  Marrianne Mood MD 10/25/2022, 11:22 AM  Pager: [phone] After 5pm on weekdays and 1pm on weekends: [phone]

## 2022-10-26 ENCOUNTER — Other Ambulatory Visit (HOSPITAL_COMMUNITY): Payer: Self-pay

## 2022-10-26 DIAGNOSIS — J111 Influenza due to unidentified influenza virus with other respiratory manifestations: Secondary | ICD-10-CM | POA: Diagnosis not present

## 2022-10-26 DIAGNOSIS — I959 Hypotension, unspecified: Secondary | ICD-10-CM | POA: Diagnosis not present

## 2022-10-26 LAB — BASIC METABOLIC PANEL
Anion gap: 14 (ref 5–15)
BUN: 12 mg/dL (ref 6–20)
CO2: 22 mmol/L (ref 22–32)
Calcium: 9.1 mg/dL (ref 8.9–10.3)
Chloride: 104 mmol/L (ref 98–111)
Creatinine, Ser: 0.89 mg/dL (ref 0.44–1.00)
GFR, Estimated: >60 mL/min (ref >60)
Glucose, Bld: 95 mg/dL (ref 70–99)
Potassium: 4.3 mmol/L (ref 3.5–5.1)
Sodium: 140 mmol/L (ref 135–145)

## 2022-10-26 LAB — MAGNESIUM: Magnesium: 1.1 mg/dL — ABNORMAL LOW (ref 1.7–2.4)

## 2022-10-26 MED ORDER — OSELTAMIVIR PHOSPHATE 75 MG PO CAPS: 75.0000 mg | ORAL_CAPSULE | Freq: Two times a day (BID) | ORAL | 0 refills | Status: AC

## 2022-10-26 MED ORDER — MAGNESIUM OXIDE 400 MG PO TABS
400.0000 mg | ORAL_TABLET | Freq: Every day | ORAL | 2 refills | Status: DC
  Filled 2022-10-26: qty 30, 30d supply, fill #0

## 2022-10-27 LAB — MISC LABCORP TEST (SEND OUT): Labcorp test code: 83935

## 2022-10-29 LAB — CULTURE, BLOOD (ROUTINE X 2)
Culture: NO GROWTH
Culture: NO GROWTH
Special Requests: ADEQUATE
Special Requests: ADEQUATE

## 2022-12-04 ENCOUNTER — Emergency Department (HOSPITAL_COMMUNITY): Payer: Medicaid Other

## 2022-12-04 ENCOUNTER — Other Ambulatory Visit: Payer: Self-pay

## 2022-12-04 ENCOUNTER — Emergency Department (HOSPITAL_COMMUNITY)
Admission: EM | Admit: 2022-12-04 | Discharge: 2022-12-05 | Disposition: A | Discharge status: Home / Self Care | Payer: Medicaid Other | Attending: Emergency Medicine | Admitting: Emergency Medicine

## 2022-12-04 ENCOUNTER — Encounter (HOSPITAL_COMMUNITY): Payer: Self-pay

## 2022-12-04 DIAGNOSIS — K5900 Constipation, unspecified: Secondary | ICD-10-CM

## 2022-12-04 DIAGNOSIS — Z7901 Long term (current) use of anticoagulants: Secondary | ICD-10-CM | POA: Insufficient documentation

## 2022-12-04 DIAGNOSIS — E876 Hypokalemia: Secondary | ICD-10-CM | POA: Diagnosis not present

## 2022-12-04 LAB — URINALYSIS, ROUTINE W REFLEX MICROSCOPIC
Bilirubin Urine: NEGATIVE
Glucose, UA: NEGATIVE mg/dL
Hgb urine dipstick: NEGATIVE
Ketones, ur: NEGATIVE mg/dL
Leukocytes,Ua: NEGATIVE
Nitrite: NEGATIVE
Protein, ur: NEGATIVE mg/dL
Specific Gravity, Urine: 1.012 (ref 1.005–1.030)
pH: 6 (ref 5.0–8.0)

## 2022-12-04 LAB — CBC
HCT: 36.6 % (ref 36.0–46.0)
Hemoglobin: 12.2 g/dL (ref 12.0–15.0)
MCH: 28.5 pg (ref 26.0–34.0)
MCHC: 33.3 g/dL (ref 30.0–36.0)
MCV: 85.5 fL (ref 80.0–100.0)
Platelets: 161 10*3/uL (ref 150–400)
RBC: 4.28 MIL/uL (ref 3.87–5.11)
RDW: 13.7 % (ref 11.5–15.5)
WBC: 6.8 10*3/uL (ref 4.0–10.5)
nRBC: 0 % (ref 0.0–0.2)

## 2022-12-04 LAB — COMPREHENSIVE METABOLIC PANEL
ALT: 25 U/L (ref 0–44)
AST: 24 U/L (ref 15–41)
Albumin: 4.4 g/dL (ref 3.5–5.0)
Alkaline Phosphatase: 80 U/L (ref 38–126)
Anion gap: 9 (ref 5–15)
BUN: 22 mg/dL — ABNORMAL HIGH (ref 6–20)
CO2: 28 mmol/L (ref 22–32)
Calcium: 9.8 mg/dL (ref 8.9–10.3)
Chloride: 104 mmol/L (ref 98–111)
Creatinine, Ser: 0.95 mg/dL (ref 0.44–1.00)
GFR, Estimated: >60 mL/min (ref >60)
Glucose, Bld: 101 mg/dL — ABNORMAL HIGH (ref 70–99)
Potassium: 3.3 mmol/L — ABNORMAL LOW (ref 3.5–5.1)
Sodium: 141 mmol/L (ref 135–145)
Total Bilirubin: 0.6 mg/dL (ref 0.3–1.2)
Total Protein: 7.4 g/dL (ref 6.5–8.1)

## 2022-12-04 LAB — LIPASE, BLOOD: Lipase: 38 U/L (ref 11–51)

## 2022-12-04 LAB — PREGNANCY, URINE: Preg Test, Ur: NEGATIVE

## 2022-12-04 MED ORDER — APIXABAN 5 MG PO TABS
5.0000 mg | ORAL_TABLET | Freq: Two times a day (BID) | ORAL | Status: DC
  Administered 2022-12-05: 5 mg via ORAL
  Filled 2022-12-04: qty 1

## 2022-12-04 MED ORDER — METHYLPHENIDATE HCL 5 MG PO TABS
20.0000 mg | ORAL_TABLET | Freq: Three times a day (TID) | ORAL | Status: DC
  Administered 2022-12-05: 20 mg via ORAL

## 2022-12-04 NOTE — ED Triage Notes (Addendum)
Patient BIB [facility] from home. Patient has had constipation for a month. Patients caregiver is being seen here and was present with her. Patient has cerebral palsy and uses a motorized wheel chair.

## 2022-12-04 NOTE — ED Provider Triage Note (Signed)
Emergency Medicine Provider Triage Evaluation Note  [name] , a 41 y.o. female  was evaluated in triage.  Pt complains of constipation for a month.  Endorses some nausea no vomiting.  Takes tramadol chronically.  History of cerebral palsy, total care Review of Systems  Positive:  Negative:   Physical Exam  BP (!) 121/90 (BP Location: Right Arm)   Pulse 96   Temp (!) 97.4 F (36.3 C) (Oral)   Resp 17   LMP 06/09/2012   SpO2 100%  Gen:   Awake, no distress   Resp:  Normal effort  MSK:   Moves extremities without difficulty  Other:  Generalized abdominal tenderness  Medical Decision Making  Medically screening exam initiated at 4:35 PM.  Appropriate orders placed.  [name] was informed that the remainder of the evaluation will be completed by another provider, this initial triage assessment does not replace that evaluation, and the importance of remaining in the ED until their evaluation is complete.     [name], [place] 12/04/22 1637

## 2022-12-04 NOTE — ED Provider Notes (Signed)
[facility] Note   CSN: [id number] Arrival date & time: 12/04/22  1619     History {Add pertinent medical, surgical, social history, OB history to HPI:1} Chief Complaint  Patient presents with   Constipation    [name] is a 41 y.o. female.  The history is provided by the patient.  Constipation [name] is a 41 y.o. female who presents to the Emergency Department complaining of constipation. Feels it but can't get it out. Ongoing for one month.  Very  Hot and cold Abdominal pain - belly button.  No vomiting, has nausea. Took milk of magnesia - yesterday.  linzess     Home Medications Prior to Admission medications   Medication Sig Start Date End Date Taking? Authorizing Provider  ELIQUIS 5 MG TABS tablet Take 5 mg by mouth 2 (two) times daily. 08/12/22   [provider]  famotidine (PEPCID) 40 MG tablet Take 40 mg by mouth daily as needed for heartburn. 03/25/22   [provider]  magnesium oxide (MAG-OX) 400 MG tablet Take 1 tablet (400 mg total) by mouth daily. 10/27/22   Marrianne Mood, MD  methocarbamol (ROBAXIN) 500 MG tablet Take 500-1,000 mg by mouth every 6 (six) hours as needed (for back spasms).    [provider]  methylphenidate (RITALIN) 20 MG tablet Take 20 mg by mouth 3 (three) times daily.    [provider]  metoprolol succinate (TOPROL-XL) 25 MG 24 hr tablet Take 25 mg by mouth daily. 01/26/22   [provider]  potassium chloride SA (K-DUR,KLOR-CON) 10 MEQ tablet Take 1 tablet (10 mEq total) by mouth daily. Patient not taking: Reported on 11/23/2017 07/10/17   Harolyn Rutherford C, PA-C  pregabalin (LYRICA) 75 MG capsule Take 75 mg by mouth 4 (four) times daily.     [provider]  sucralfate (CARAFATE) 1 g tablet Take 1 g by mouth 4 (four) times daily.    [provider]  traMADol (ULTRAM) 50 MG tablet Take 50 mg by mouth every 6 (six) hours as  needed for moderate pain. 09/20/13   [provider]      Allergies    Duloxetine, Flecainide, Fluoxetine, Gabapentin, Baclofen, Cephalosporins, Sulfasalazine, Sulfa antibiotics, and Vancomycin    Review of Systems   Review of Systems  Gastrointestinal:  Positive for constipation.  All other systems reviewed and are negative.   Physical Exam Updated Vital Signs BP (!) 158/89   Pulse 90   Temp 97.6 F (36.4 C) (Oral)   Resp 18   LMP 06/09/2012   SpO2 100%  Physical Exam Vitals and nursing note reviewed.  Constitutional:      Appearance: She is well-developed.  HENT:     Head: Normocephalic and atraumatic.  Cardiovascular:     Rate and Rhythm: Normal rate and regular rhythm.     Heart sounds: No murmur heard. Pulmonary:     Effort: Pulmonary effort is normal. No respiratory distress.     Breath sounds: Normal breath sounds.  Abdominal:     Palpations: Abdomen is soft.     Tenderness: There is no abdominal tenderness. There is no guarding or rebound.  Musculoskeletal:        General: No tenderness.  Skin:    General: Skin is warm and dry.  Neurological:     Mental Status: She is alert.     Comments: ***  Psychiatric:        Behavior: Behavior normal.  ED Results / Procedures / Treatments   Labs (all labs ordered are listed, but only abnormal results are displayed) Labs Reviewed  COMPREHENSIVE METABOLIC PANEL - Abnormal; Notable for the following components:      Result Value   Potassium 3.3 (*)    Glucose, Bld 101 (*)    BUN 22 (*)    All other components within normal limits  URINALYSIS, ROUTINE W REFLEX MICROSCOPIC - Abnormal; Notable for the following components:   Color, Urine STRAW (*)    All other components within normal limits  LIPASE, BLOOD  CBC  PREGNANCY, URINE    EKG None  Radiology CT ABDOMEN PELVIS WO CONTRAST  Result Date: 12/04/2022 CLINICAL DATA:  Abdominal pain, constipation EXAM: CT ABDOMEN AND PELVIS WITHOUT CONTRAST  TECHNIQUE: Multidetector CT imaging of the abdomen and pelvis was performed following the standard protocol without IV contrast. Unenhanced CT was performed per clinician order. Lack of IV contrast limits sensitivity and specificity, especially for evaluation of abdominal/pelvic solid viscera. RADIATION DOSE REDUCTION: This exam was performed according to the departmental dose-optimization program which includes automated exposure control, adjustment of the mA and/or kV according to patient size and/or use of iterative reconstruction technique. COMPARISON:  10/24/2022 FINDINGS: Lower chest: Limited by respiratory motion. No acute pleural or parenchymal lung disease. Hepatobiliary: Cholecystectomy. Unremarkable unenhanced appearance of the liver. Pancreas: Unremarkable unenhanced appearance. Spleen: Unremarkable unenhanced appearance. Adrenals/Urinary Tract: Stable bilateral renal calculi, measuring up to 15 mm on the right and 8 mm on the left. No obstructive uropathy within either kidney. Bladder is unremarkable. The adrenals are stable. Stomach/Bowel: Evaluation of the bowel is limited due to streak artifact from indwelling pacemaker in the right upper anterior abdominal wall, lack of oral and intravenous contrast, and the lack of intraperitoneal fat. There is no evidence of bowel obstruction. Moderate retained stool throughout the colon consistent with constipation. Normal gas-filled appendix right lower quadrant. Vascular/Lymphatic: Aortic atherosclerosis. No enlarged abdominal or pelvic lymph nodes. Reproductive: Status post hysterectomy. No adnexal masses. Other: No free fluid or free intraperitoneal gas. No abdominal wall hernia. Musculoskeletal: No acute or destructive bony lesions. Reconstructed images demonstrate no additional findings. IMPRESSION: 1. Moderate fecal retention throughout the colon consistent with constipation. No bowel obstruction or ileus. 2. Stable bilateral nonobstructing renal calculi.  3.  Aortic Atherosclerosis (ICD10-I70.0). Electronically Signed   By: Sharlet Salina [name]   On: 12/04/2022 18:23    Procedures Procedures  {Document cardiac monitor, telemetry assessment procedure when appropriate:1}  Medications Ordered in ED Medications - No data to display  ED Course/ Medical Decision Making/ A&P                           Medical Decision Making Amount and/or Complexity of Data Reviewed Labs: ordered.   ***  {Document critical care time when appropriate:1} {Document review of labs and clinical decision tools ie heart score, Chads2Vasc2 etc:1}  {Document your independent review of radiology images, and any outside records:1} {Document your discussion with family members, caretakers, and with consultants:1} {Document social determinants of health affecting pt's care:1} {Document your decision making why or why not admission, treatments were needed:1} Final Clinical Impression(s) / ED Diagnoses Final diagnoses:  None    Rx / DC Orders ED Discharge Orders     None

## 2022-12-05 MED ORDER — MAGNESIUM CITRATE PO SOLN
1.0000 | Freq: Once | ORAL | Status: AC
  Administered 2022-12-05: 1 via ORAL
  Filled 2022-12-05: qty 296

## 2022-12-05 MED ORDER — LACTULOSE 10 G PO PACK: 10.0000 g | PACK | Freq: Three times a day (TID) | ORAL | 0 refills | Status: DC | PRN

## 2022-12-05 MED ORDER — MAGNESIUM CITRATE PO SOLN
1.0000 | Freq: Once | ORAL | Status: DC
  Filled 2022-12-05: qty 296

## 2022-12-05 MED ORDER — MAGNESIUM HYDROXIDE 400 MG/5ML PO SUSP
30.0000 mL | Freq: Once | ORAL | Status: AC
  Administered 2022-12-05: 30 mL via ORAL
  Filled 2022-12-05: qty 30

## 2022-12-05 MED ORDER — POTASSIUM CHLORIDE CRYS ER 20 MEQ PO TBCR
20.0000 meq | EXTENDED_RELEASE_TABLET | Freq: Once | ORAL | Status: AC
  Administered 2022-12-05: 20 meq via ORAL
  Filled 2022-12-05: qty 1

## 2022-12-09 ENCOUNTER — Other Ambulatory Visit: Payer: Self-pay

## 2022-12-09 ENCOUNTER — Inpatient Hospital Stay
Admission: EM | Admit: 2022-12-09 | Discharge: 2022-12-13 | DRG: 377 | Disposition: A | Discharge status: Home / Self Care | Payer: Medicaid Other | Attending: Internal Medicine | Admitting: Internal Medicine

## 2022-12-09 DIAGNOSIS — I495 Sick sinus syndrome: Secondary | ICD-10-CM | POA: Diagnosis present

## 2022-12-09 DIAGNOSIS — K922 Gastrointestinal hemorrhage, unspecified: Secondary | ICD-10-CM | POA: Diagnosis present

## 2022-12-09 DIAGNOSIS — Z888 Allergy status to other drugs, medicaments and biological substances status: Secondary | ICD-10-CM

## 2022-12-09 DIAGNOSIS — M549 Dorsalgia, unspecified: Secondary | ICD-10-CM | POA: Diagnosis present

## 2022-12-09 DIAGNOSIS — Z79890 Hormone replacement therapy (postmenopausal): Secondary | ICD-10-CM

## 2022-12-09 DIAGNOSIS — K625 Hemorrhage of anus and rectum: Principal | ICD-10-CM

## 2022-12-09 DIAGNOSIS — K640 First degree hemorrhoids: Secondary | ICD-10-CM

## 2022-12-09 DIAGNOSIS — K644 Residual hemorrhoidal skin tags: Secondary | ICD-10-CM | POA: Diagnosis present

## 2022-12-09 DIAGNOSIS — Z681 Body mass index (BMI) 19 or less, adult: Secondary | ICD-10-CM

## 2022-12-09 DIAGNOSIS — G90A Postural orthostatic tachycardia syndrome (POTS): Secondary | ICD-10-CM | POA: Diagnosis present

## 2022-12-09 DIAGNOSIS — G90521 Complex regional pain syndrome I of right lower limb: Secondary | ICD-10-CM | POA: Diagnosis present

## 2022-12-09 DIAGNOSIS — E43 Unspecified severe protein-calorie malnutrition: Secondary | ICD-10-CM | POA: Diagnosis present

## 2022-12-09 DIAGNOSIS — Z882 Allergy status to sulfonamides status: Secondary | ICD-10-CM

## 2022-12-09 DIAGNOSIS — Z881 Allergy status to other antibiotic agents status: Secondary | ICD-10-CM

## 2022-12-09 DIAGNOSIS — Z7901 Long term (current) use of anticoagulants: Secondary | ICD-10-CM

## 2022-12-09 DIAGNOSIS — Z950 Presence of cardiac pacemaker: Secondary | ICD-10-CM

## 2022-12-09 DIAGNOSIS — Z86718 Personal history of other venous thrombosis and embolism: Secondary | ICD-10-CM

## 2022-12-09 DIAGNOSIS — Z79899 Other long term (current) drug therapy: Secondary | ICD-10-CM

## 2022-12-09 DIAGNOSIS — Z539 Procedure and treatment not carried out, unspecified reason: Secondary | ICD-10-CM | POA: Diagnosis present

## 2022-12-09 DIAGNOSIS — E876 Hypokalemia: Secondary | ICD-10-CM

## 2022-12-09 DIAGNOSIS — T45515A Adverse effect of anticoagulants, initial encounter: Secondary | ICD-10-CM | POA: Diagnosis present

## 2022-12-09 DIAGNOSIS — G809 Cerebral palsy, unspecified: Secondary | ICD-10-CM | POA: Diagnosis present

## 2022-12-09 DIAGNOSIS — K582 Mixed irritable bowel syndrome: Secondary | ICD-10-CM | POA: Diagnosis present

## 2022-12-09 DIAGNOSIS — G894 Chronic pain syndrome: Secondary | ICD-10-CM | POA: Diagnosis present

## 2022-12-09 DIAGNOSIS — R471 Dysarthria and anarthria: Secondary | ICD-10-CM | POA: Diagnosis present

## 2022-12-09 DIAGNOSIS — K589 Irritable bowel syndrome without diarrhea: Secondary | ICD-10-CM | POA: Diagnosis present

## 2022-12-09 DIAGNOSIS — K5641 Fecal impaction: Secondary | ICD-10-CM | POA: Diagnosis present

## 2022-12-09 DIAGNOSIS — D6832 Hemorrhagic disorder due to extrinsic circulating anticoagulants: Secondary | ICD-10-CM | POA: Diagnosis present

## 2022-12-09 DIAGNOSIS — I10 Essential (primary) hypertension: Secondary | ICD-10-CM | POA: Diagnosis present

## 2022-12-09 DIAGNOSIS — K5900 Constipation, unspecified: Secondary | ICD-10-CM

## 2022-12-09 DIAGNOSIS — K219 Gastro-esophageal reflux disease without esophagitis: Secondary | ICD-10-CM | POA: Diagnosis present

## 2022-12-09 DIAGNOSIS — Z79891 Long term (current) use of opiate analgesic: Secondary | ICD-10-CM

## 2022-12-09 DIAGNOSIS — Z90710 Acquired absence of both cervix and uterus: Secondary | ICD-10-CM

## 2022-12-09 LAB — COMPREHENSIVE METABOLIC PANEL
ALT: 22 U/L (ref 0–44)
AST: 27 U/L (ref 15–41)
Albumin: 4.5 g/dL (ref 3.5–5.0)
Alkaline Phosphatase: 82 U/L (ref 38–126)
Anion gap: 12 (ref 5–15)
BUN: 16 mg/dL (ref 6–20)
CO2: 23 mmol/L (ref 22–32)
Calcium: 9.5 mg/dL (ref 8.9–10.3)
Chloride: 106 mmol/L (ref 98–111)
Creatinine, Ser: 0.94 mg/dL (ref 0.44–1.00)
GFR, Estimated: >60 mL/min (ref >60)
Glucose, Bld: 119 mg/dL — ABNORMAL HIGH (ref 70–99)
Potassium: 3.3 mmol/L — ABNORMAL LOW (ref 3.5–5.1)
Sodium: 141 mmol/L (ref 135–145)
Total Bilirubin: 0.4 mg/dL (ref 0.3–1.2)
Total Protein: 7.5 g/dL (ref 6.5–8.1)

## 2022-12-09 LAB — TYPE AND SCREEN
ABO/RH(D): O POS
Antibody Screen: NEGATIVE

## 2022-12-09 LAB — CBC
HCT: 38.5 % (ref 36.0–46.0)
Hemoglobin: 12.7 g/dL (ref 12.0–15.0)
MCH: 27.9 pg (ref 26.0–34.0)
MCHC: 33 g/dL (ref 30.0–36.0)
MCV: 84.6 fL (ref 80.0–100.0)
Platelets: 159 10*3/uL (ref 150–400)
RBC: 4.55 MIL/uL (ref 3.87–5.11)
RDW: 13.6 % (ref 11.5–15.5)
WBC: 7.4 10*3/uL (ref 4.0–10.5)
nRBC: 0 % (ref 0.0–0.2)

## 2022-12-09 NOTE — ED Triage Notes (Signed)
Pt reports she has a GI bleed and some abd pain. Pt reports she passed blood and it had a clot it in. Pt reports has some abd pain as well

## 2022-12-09 NOTE — ED Triage Notes (Signed)
Lab called for assistance with lab collection °

## 2022-12-09 NOTE — ED Triage Notes (Signed)
First Nurse Note:  BIB [facility].  Received an enema last Friday at [facility] for constipation.  Patient states started to pass blood, no stool.  Today passed a large round hard stool.  VS wnl.

## 2022-12-10 ENCOUNTER — Observation Stay: Payer: Medicaid Other

## 2022-12-10 ENCOUNTER — Emergency Department: Payer: Medicaid Other

## 2022-12-10 DIAGNOSIS — I10 Essential (primary) hypertension: Secondary | ICD-10-CM | POA: Diagnosis present

## 2022-12-10 DIAGNOSIS — K219 Gastro-esophageal reflux disease without esophagitis: Secondary | ICD-10-CM | POA: Diagnosis present

## 2022-12-10 DIAGNOSIS — K625 Hemorrhage of anus and rectum: Secondary | ICD-10-CM

## 2022-12-10 DIAGNOSIS — E876 Hypokalemia: Secondary | ICD-10-CM | POA: Diagnosis not present

## 2022-12-10 DIAGNOSIS — K922 Gastrointestinal hemorrhage, unspecified: Secondary | ICD-10-CM | POA: Diagnosis present

## 2022-12-10 DIAGNOSIS — K589 Irritable bowel syndrome without diarrhea: Secondary | ICD-10-CM | POA: Diagnosis present

## 2022-12-10 DIAGNOSIS — K581 Irritable bowel syndrome with constipation: Secondary | ICD-10-CM

## 2022-12-10 LAB — CBG MONITORING, ED: Glucose-Capillary: 108 mg/dL — ABNORMAL HIGH (ref 70–99)

## 2022-12-10 LAB — HEMOGLOBIN AND HEMATOCRIT, BLOOD
HCT: 35.5 % — ABNORMAL LOW (ref 36.0–46.0)
Hemoglobin: 11.7 g/dL — ABNORMAL LOW (ref 12.0–15.0)

## 2022-12-10 LAB — PROTIME-INR
INR: 1.2 (ref 0.8–1.2)
Prothrombin Time: 15.3 seconds — ABNORMAL HIGH (ref 11.4–15.2)

## 2022-12-10 LAB — MAGNESIUM: Magnesium: 1.4 mg/dL — ABNORMAL LOW (ref 1.7–2.4)

## 2022-12-10 MED ORDER — ONDANSETRON HCL 4 MG/2ML IJ SOLN: 4.0000 mg | Freq: Once | INTRAMUSCULAR | Status: DC

## 2022-12-10 MED ORDER — METOCLOPRAMIDE HCL 5 MG/ML IJ SOLN
10.0000 mg | Freq: Once | INTRAMUSCULAR | Status: AC
  Administered 2022-12-10: 10 mg via INTRAVENOUS
  Filled 2022-12-10: qty 2

## 2022-12-10 MED ORDER — METHYLPHENIDATE HCL 10 MG PO TABS
20.0000 mg | ORAL_TABLET | Freq: Once | ORAL | Status: AC
  Administered 2022-12-10: 20 mg via ORAL
  Filled 2022-12-10: qty 2

## 2022-12-10 MED ORDER — MAGNESIUM SULFATE 2 GM/50ML IV SOLN
2.0000 g | Freq: Once | INTRAVENOUS | Status: AC
  Administered 2022-12-10: 2 g via INTRAVENOUS
  Filled 2022-12-10: qty 50

## 2022-12-10 MED ORDER — SODIUM CHLORIDE 0.9 % IV BOLUS (SEPSIS)
1000.0000 mL | Freq: Once | INTRAVENOUS | Status: AC
  Administered 2022-12-10: 1000 mL via INTRAVENOUS

## 2022-12-10 MED ORDER — MAGNESIUM LACTATE 84 MG (7MEQ) PO TBCR: 936.0000 mg | EXTENDED_RELEASE_TABLET | Freq: Three times a day (TID) | ORAL | Status: DC

## 2022-12-10 MED ORDER — PEG 3350-KCL-NA BICARB-NACL 420 G PO SOLR: 2000.0000 mL | Freq: Once | ORAL | Status: DC | PRN

## 2022-12-10 MED ORDER — DICYCLOMINE HCL 10 MG PO CAPS
10.0000 mg | ORAL_CAPSULE | Freq: Once | ORAL | Status: AC
  Administered 2022-12-10: 10 mg via ORAL
  Filled 2022-12-10: qty 1

## 2022-12-10 MED ORDER — METOPROLOL SUCCINATE ER 25 MG PO TB24
12.5000 mg | ORAL_TABLET | Freq: Every day | ORAL | Status: DC
  Administered 2022-12-10: 12.5 mg via ORAL
  Filled 2022-12-10 (×3): qty 1

## 2022-12-10 MED ORDER — PREGABALIN 75 MG PO CAPS
150.0000 mg | ORAL_CAPSULE | Freq: Three times a day (TID) | ORAL | Status: DC
  Administered 2022-12-10 – 2022-12-13 (×9): 150 mg via ORAL
  Filled 2022-12-10: qty 3
  Filled 2022-12-10 (×3): qty 2
  Filled 2022-12-10 (×2): qty 3
  Filled 2022-12-10 (×3): qty 2

## 2022-12-10 MED ORDER — LINACLOTIDE 145 MCG PO CAPS
290.0000 ug | ORAL_CAPSULE | Freq: Every day | ORAL | Status: DC
  Filled 2022-12-10: qty 2
  Filled 2022-12-10: qty 1
  Filled 2022-12-10: qty 2

## 2022-12-10 MED ORDER — POTASSIUM CHLORIDE 2 MEQ/ML IV SOLN
INTRAVENOUS | Status: DC
  Filled 2022-12-10: qty 1000

## 2022-12-10 MED ORDER — METOCLOPRAMIDE HCL 10 MG PO TABS
5.0000 mg | ORAL_TABLET | Freq: Four times a day (QID) | ORAL | Status: DC
  Administered 2022-12-10 (×2): 5 mg via ORAL
  Filled 2022-12-10 (×2): qty 1

## 2022-12-10 MED ORDER — IOHEXOL 350 MG/ML SOLN
80.0000 mL | Freq: Once | INTRAVENOUS | Status: AC | PRN
  Administered 2022-12-10: 80 mL via INTRAVENOUS

## 2022-12-10 MED ORDER — PLECANATIDE 3 MG PO TABS: 1.0000 | ORAL_TABLET | Freq: Every day | ORAL | Status: DC

## 2022-12-10 MED ORDER — METHYLPHENIDATE HCL 10 MG PO TABS: 20.0000 mg | ORAL_TABLET | Freq: Three times a day (TID) | ORAL | Status: DC

## 2022-12-10 MED ORDER — POTASSIUM CHLORIDE CRYS ER 20 MEQ PO TBCR
40.0000 meq | EXTENDED_RELEASE_TABLET | Freq: Once | ORAL | Status: AC
  Administered 2022-12-10: 40 meq via ORAL
  Filled 2022-12-10: qty 2

## 2022-12-10 MED ORDER — PEG 3350-KCL-NA BICARB-NACL 420 G PO SOLR
4000.0000 mL | Freq: Once | ORAL | Status: AC
  Administered 2022-12-10 – 2022-12-11 (×2): 4000 mL via ORAL
  Filled 2022-12-10: qty 4000

## 2022-12-10 MED ORDER — MAGNESIUM SULFATE 4 GM/100ML IV SOLN
4.0000 g | Freq: Once | INTRAVENOUS | Status: AC
  Administered 2022-12-10: 4 g via INTRAVENOUS
  Filled 2022-12-10: qty 100

## 2022-12-10 MED ORDER — PREGABALIN 50 MG PO CAPS: 150.0000 mg | ORAL_CAPSULE | Freq: Three times a day (TID) | ORAL | Status: DC

## 2022-12-10 MED ORDER — METHYLPHENIDATE HCL 10 MG PO TABS
20.0000 mg | ORAL_TABLET | Freq: Three times a day (TID) | ORAL | Status: DC
  Administered 2022-12-10 – 2022-12-11 (×3): 20 mg via ORAL
  Filled 2022-12-10 (×3): qty 2

## 2022-12-10 NOTE — Progress Notes (Signed)
       CROSS COVER NOTE  NAME: YSIDRA SOPHER MRN: [id number] DOB : Jun 07, 1981   HPI/Events of Note   Family requesting IV fluids due to poor oral intak  Assessment and  Interventions   Assessment: Notes/ chart/ labs reviewed Plan: LR with 20KCL continuous at 50 ml/h CBG monitoring every 4 hours      Donnie Mesa NP [facility]

## 2022-12-10 NOTE — Progress Notes (Signed)
Attempted NGT insertion x3. Tube will not advance once past nasal cavity. Called charge nurse Dawn to see if she could attempt. Order states that if patient cannot swallow prep, she would have to get an NGT to administer. Patient is unable to swallow prep but is able to take pills. Patient's mom had me contact provider on call Steward Drone) to see if patient may have IV fluids since she has decreased po intake.

## 2022-12-10 NOTE — Assessment & Plan Note (Signed)
-   We will continue Toprol-XL. 

## 2022-12-10 NOTE — ED Notes (Signed)
Pt's family at bedside states is diabetic and requested food. No trays currently available in ER; family given diet gingerale. Called dietary to send ER food trays. Family notified.

## 2022-12-10 NOTE — ED Notes (Addendum)
Soap suds enema complete, significant amount of blood in liquid return. No stool noted. Pt placed in new brief and clean chucks pads.

## 2022-12-10 NOTE — Progress Notes (Addendum)
NGT placed by ED nurse with my assistance. Verification done by listening to abdominal area as air was inserted and aspirating gastric fluids. Ordered final verification with xray. Awaiting results before giving bowel prep. Fluid admin delayed due to portable searching for a channel. Will administer as soon as one is located.

## 2022-12-10 NOTE — H&P (Signed)
[place]   PATIENT NAME: [name]    MR#:  [id number]  DATE OF BIRTH:  08/18/1981  DATE OF ADMISSION:  12/09/2022  PRIMARY CARE PHYSICIAN: [place]   Patient is coming from: Home  REQUESTING/REFERRING PHYSICIAN: Ward, Layla Maw, DO CHIEF COMPLAINT:   Chief Complaint  Patient presents with   Abdominal Pain   Rectal Bleeding    HISTORY OF PRESENT ILLNESS:  [name] is a 41 y.o. 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  She has been having intermittent bleeding since then and her mother has noticed blood clots.  Her last bowel movement was this morning.  She admits to occasional abdominal discomfort no other bleeding.  No fever or chills.  No dysuria, oliguria or hematuria or flank pain.  No other bleeding diathesis.  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  ED Course: When the patient came to the ER, heart rate was 102 with otherwise normal vital signs.  Labs revealed hypokalemia of 3.3 with magnesium of 1.4 with otherwise normal CMP.  CBC showed a hemoglobin of 12.7 hematocrit 38.5 and later 11.7/35.5 within 9 hours.  PT was 15.3 and INR 1.2.  Blood group was O+ with negative antibody screen.  EKG as reviewed by me : Paced rhythm with a rate of 89 Imaging: Two-view chest x-ray showed no acute cardiopulmonary disease.  The patient will be admitted to an observation medical telemetry bed for further evaluation and management. 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   RSD (reflex sympathetic dystrophy)    right foot,    RSD (reflex sympathetic dystrophy)    SA node dysfunction (HCC) 2008    PAST SURGICAL HISTORY:   Past Surgical History:  Procedure Laterality Date   ABDOMINAL HYSTERECTOMY Left 2013   Dr. Tiburcio Pea   INSERT / REPLACE / REMOVE PACEMAKER     ORTHOPEDIC SURGERY     PACEMAKER INSERTION     PORTACATH PLACEMENT      SOCIAL HISTORY:   Social History   Tobacco Use   Smoking status: Never   Smokeless tobacco: Never  Substance Use Topics   Alcohol use: No    Alcohol/week: 0.0 standard drinks of alcohol    FAMILY HISTORY:   Family History  Problem Relation Age of Onset   Diabetes Mother    Hypertension Mother     DRUG ALLERGIES:   Allergies  Allergen Reactions   Duloxetine     Other reaction(s): Other (See Comments), Palpitations Other reaction(s): Hallucinations, Hyperactive behavior (finding), Other (See Comments) Stressed out     Flecainide     Other reaction(s): Other (See Comments) Other reaction(s): Hypomagnesemia (disorder), Other (See Comments) Other Reaction: QUESTION HYPOMAGNESEMIA     Fluoxetine     Other reaction(s): Nausea And Vomiting,  Other (See Comments) Other reaction(s): Other (See Comments) Other Reaction: INCREASED HR  causes irritability and irrational behavior "I go crazy"   Gabapentin     Goes crazy Other reaction(s): Other (See Comments) Other reaction(s): Hyperactive behavior (finding), Mental Status Changes (intolerance), Other (See Comments) Goes crazy Hallucinations   Baclofen     Other reaction(s): Other (See Comments), Other (See Comments) Weakness  Other reaction(s): Other (See Comments) Weakness and loose Weakness  Other reaction(s):  Weakness     Cephalosporins     Other reaction(s): Hives, Rash Received pre-op  vancomycin and cefazolin 07/20/16. Developed urticarial rash on bilateral lower extremities + slight periorbital swelling. Treated with diphenhydramine. Received pre-op vancomycin and cefazolin 07/20/16. Developed urticarial rash on bilateral lower extremities + slight periorbital swelling. Treated with diphenhydramine. Received pre-op vancomycin and cefazolin 07/20/16. Developed urticarial rash on bilateral lower extremities + slight periorbital swelling. Treated with diphenhydramine.   Sulfasalazine     Other reaction(s): Nausea And Vomiting   Sulfa Antibiotics Nausea And Vomiting   Vancomycin     Other reaction(s): Hives Received pre-op vancomycin and cefazolin 07/20/16. Developed urticarial rash on bilateral lower extremities + slight periorbital swelling. Treated with diphenhydramine. Received pre-op vancomycin and cefazolin 07/20/16. Developed urticarial rash on bilateral lower extremities + slight periorbital swelling. Treated with diphenhydramine. Received pre-op vancomycin and cefazolin 07/20/16. Developed urticarial rash on bilateral lower extremities + slight periorbital swelling. Treated with diphenhydramine.    REVIEW OF SYSTEMS:   ROS As per history of present illness. All pertinent systems were reviewed above. Constitutional, HEENT, cardiovascular, respiratory, GI, GU, musculoskeletal, neuro, psychiatric, endocrine, integumentary and hematologic systems were reviewed and are otherwise negative/unremarkable except for positive findings mentioned above in the HPI.   MEDICATIONS AT HOME:   Prior to Admission medications   Medication Sig Start Date End Date Taking? Authorizing Provider  ELIQUIS 5 MG TABS tablet Take 5 mg by mouth 2 (two) times daily. 08/12/22   [provider]  famotidine (PEPCID) 40 MG tablet Take 40 mg by mouth daily as needed for heartburn. 03/25/22   [provider]  lactulose (CEPHULAC) 10 g packet Take 1 packet (10 g total) by mouth 3 (three) times daily  as needed (constipation). 12/05/22   Tilden Fossa, MD  LINZESS 290 MCG CAPS capsule Take 290 mcg by mouth daily. 10/26/22   [provider]  magnesium oxide (MAG-OX) 400 MG tablet Take 1 tablet (400 mg total) by mouth daily. 10/27/22   Marrianne Mood, MD  methocarbamol (ROBAXIN) 500 MG tablet Take 500-1,000 mg by mouth every 6 (six) hours as needed (for back spasms).    [provider]  methylphenidate (RITALIN) 20 MG tablet Take 20 mg by mouth 3 (three) times daily. Take 1 tablet (20 mg) TID at 0800, 1200 & 2100-2200    [provider]  metoCLOPramide (REGLAN) 5 MG tablet Take 5 mg by mouth 4 (four) times daily. 11/05/22   [provider]  metoprolol succinate (TOPROL-XL) 25 MG 24 hr tablet Take 12.5 mg by mouth daily. 01/26/22   [provider]  montelukast (SINGULAIR) 10 MG tablet Take 10 mg by mouth daily as needed (allergies). 10/26/22   [provider]  pregabalin (LYRICA) 150 MG capsule Take 150 mg by mouth in the morning, at noon, and at bedtime.    [provider]  PREMARIN vaginal cream Place 1 applicator vaginally daily. 11/08/22 02/06/23  [provider]  sucralfate (CARAFATE) 1 g tablet Take 1 g by mouth 4 (  four) times daily.    [provider]  traMADol (ULTRAM) 50 MG tablet Take 50 mg by mouth every 6 (six) hours as needed for moderate pain. 09/20/13   [provider]  TRULANCE 3 MG TABS Take 1 tablet by mouth daily. 11/16/22   [provider]      VITAL SIGNS:  Blood pressure 115/79, pulse 86, temperature (!) 97.5 F (36.4 C), temperature source Oral, resp. rate 18, height 5\' 2"  (1.575 m), weight 39 kg, last menstrual period 06/09/2012, SpO2 99 %.  PHYSICAL EXAMINATION:  Physical Exam  GENERAL:  41 y.o.-year-old Caucasian female patient lying in the bed with no acute distress.  EYES: Pupils equal, round, reactive to light and accommodation. No scleral icterus. Extraocular  muscles intact.  HEENT: Head atraumatic, normocephalic. Oropharynx and nasopharynx clear.  NECK:  Supple, no jugular venous distention. No thyroid enlargement, no tenderness.  LUNGS: Normal breath sounds bilaterally, no wheezing, rales,rhonchi or crepitation. No use of accessory muscles of respiration.  CARDIOVASCULAR: Regular rate and rhythm, S1, S2 normal. No murmurs, rubs, or gallops.  ABDOMEN: Soft, nondistended, nontender. Bowel sounds present. No organomegaly or mass. She had large amount of gross bright red blood on rectal examination per the ER physician without melena EXTREMITIES: No pedal edema, cyanosis, or clubbing.  NEUROLOGIC: She moves all 4 extremities.  She has cerebral palsy. PSYCHIATRIC: The patient is alert and oriented x 3.  Normal affect and good eye contact. SKIN: No obvious rash, lesion, or ulcer.   LABORATORY PANEL:   CBC Recent Labs  Lab 12/09/22 1553 12/10/22 0044  WBC 7.4  --   HGB 12.7 11.7*  HCT 38.5 35.5*  PLT 159  --    ------------------------------------------------------------------------------------------------------------------  Chemistries  Recent Labs  Lab 12/09/22 1553 12/10/22 0044  NA 141  --   K 3.3*  --   CL 106  --   CO2 23  --   GLUCOSE 119*  --   BUN 16  --   CREATININE 0.94  --   CALCIUM 9.5  --   MG  --  1.4*  AST 27  --   ALT 22  --   ALKPHOS 82  --   BILITOT 0.4  --    ------------------------------------------------------------------------------------------------------------------  Cardiac Enzymes No results for input(s): "TROPONINI" in the last 168 hours. ------------------------------------------------------------------------------------------------------------------  RADIOLOGY:  CT ANGIO GI BLEED  Result Date: 12/10/2022 CLINICAL DATA:  Lower GI bleed. EXAM: CTA ABDOMEN AND PELVIS WITHOUT AND WITH CONTRAST TECHNIQUE: Multidetector CT imaging of the abdomen and pelvis was performed using the standard protocol  during bolus administration of intravenous contrast. Multiplanar reconstructed images and MIPs were obtained and reviewed to evaluate the vascular anatomy. RADIATION DOSE REDUCTION: This exam was performed according to the departmental dose-optimization program which includes automated exposure control, adjustment of the mA and/or kV according to patient size and/or use of iterative reconstruction technique. CONTRAST:  24mRegional West58mMTwin Valley Behavi75mrNorwalk Sur51meNaugat[place] Valley Endos1moEur40mkDr Solomon Carter Fuller Menta74m [facility]18meRolling Plains Memorial HospitalocaXOL 350 MG/ML SOLN COMPARISON:  CT abdomen pelvis dated 12/04/2022. FINDINGS: Evaluation is limited due to streak artifact caused by patient's arm and metallic stimulator device in the anterior abdomen. VASCULAR Aorta: Normal caliber aorta without aneurysm, dissection, vasculitis or significant stenosis. Celiac: Patent without evidence of aneurysm, dissection, vasculitis or significant stenosis. SMA: Patent without evidence of aneurysm, dissection, vasculitis or significant stenosis. Renals: Both renal arteries are patent without evidence of aneurysm, dissection, vasculitis, fibromuscular dysplasia or significant stenosis. IMA: Patent without evidence of aneurysm, dissection, vasculitis or significant stenosis. Inflow: Patent without evidence of aneurysm,  dissection, vasculitis or significant stenosis. Proximal Outflow: Bilateral common femoral and visualized portions of the superficial and profunda femoral arteries are patent without evidence of aneurysm, dissection, vasculitis or significant stenosis. Veins: The IVC is unremarkable. The SMV, splenic vein, and main portal vein are patent. No portal venous gas. Review of the MIP images confirms the above findings. NON-VASCULAR Lower chest: The visualized lung bases are clear. 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 No intra-abdominal free air or free fluid. Hepatobiliary: The liver is unremarkable as visualized. Cholecystectomy. Mild biliary dilatation. No retained calcified stone noted in the central CBD. Pancreas: The  pancreas is suboptimally evaluated due to paucity of intra-abdominal fat. The tail and body of the pancreas are not seen. The head and uncinate process of pancreas appear unremarkable. Spleen: Normal in size without focal abnormality. Adrenals/Urinary Tract: The adrenal glands are unremarkable. Bilateral renal calculi as seen on the prior CT measure up to 1.5 cm in the inferior pole of the right kidney. No hydronephrosis. Multiple subcentimeter bilateral renal hypodense lesions are not characterized. The urinary bladder is grossly unremarkable. Stomach/Bowel: Oral contrast noted within the stomach which limits evaluation for GI bleed. There is large amount of stool throughout the colon. No bowel obstruction. No evidence of active GI bleed. Lymphatic: No obvious adenopathy. Evaluation however is limited due to paucity of intra-abdominal fat and other limiting factors above. Reproductive: Hysterectomy. Other: None Musculoskeletal: No acute osseous pathology. Median sternotomy wires. IMPRESSION: 1. No acute intra-abdominal or pelvic pathology. No evidence of active GI bleed. 2. Constipation.  No bowel obstruction. 3. Bilateral renal calculi. No hydronephrosis. Electronically Signed   By: Elgie Collard [name]   On: 12/10/2022 01:47      IMPRESSION AND PLAN:  Assessment and Plan: * GI bleeding - The patient be admitted to a medical telemetry bed. - We will follow serial H&H. - She was typed and crossmatched. - GI consult will be obtained. - I notified Dr. Timothy Lasso about the patient. - We will hold off her Eliquis. - We will place her on twice daily IV Protonix for now.  Hypokalemia - We will replace potassium.  Hypomagnesemia - We will replaceme magnesium.  IBS (irritable bowel syndrome) - This could be contributing to her constipation. - We will continue Trulance and will continue her Linzess.  GERD without esophagitis - We will continue PPI therapy and Carafate.  Essential hypertension - We  will continue Toprol-XL.   DVT prophylaxis: SCDs.  Medical prophylaxis currently contraindicated due to GI bleeding Advanced Care Planning:  Code Status: full code. Family Communication:  The plan of care was discussed in details with the patient (and family). I answered all questions. The patient agreed to proceed with the above mentioned plan. Further management will depend upon hospital course. Disposition Plan: Back to previous home environment Consults called: GI All the records are reviewed and case discussed with ED provider.  Status is: Observation   I certify that at the time of admission, it is my clinical judgment that the patient will require hospital care extending less than 2 midnights.                            Dispo: The patient is from: Home              Anticipated d/c is to: Home              Patient currently is not medically stable to d/c.  Difficult to place patient: No  Hannah Beat M.D on 12/10/2022 at 5:17 AM  [facility]   From 7 PM-7 AM, contact night-coverage [email or web address]  CC: Primary care physician; [facility]

## 2022-12-10 NOTE — Progress Notes (Signed)
Tube placement verified. Began giving bowel prep in increments of due to patient's tolerance then until finished.

## 2022-12-10 NOTE — Progress Notes (Signed)
Progress Note   Patient: [name] JSH:[id number] DOB: May 02, 1981 DOA: 12/09/2022     0 DOS: the patient was seen and examined on 12/10/2022   Brief hospital course: Taken from H&P.  [name] is a 41 y.o. 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  She has been having intermittent bleeding since then and her mother has noticed blood clots.  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   D Course: When the patient came to the ER, heart rate was 102 with otherwise normal vital signs.  Labs revealed hypokalemia of 3.3 with magnesium of 1.4 with otherwise normal CMP.  CBC showed a hemoglobin of 12.7 hematocrit 38.5 and later 11.7/35.5 within 9 hours.  PT was 15.3 and INR 1.2.  Blood group was O+ with negative antibody screen.   EKG ,  Paced rhythm with a rate of 89 Imaging: Two-view chest x-ray showed no acute cardiopulmonary disease. GI was consulted and Eliquis was held.  12/29: Vitals stable and hemoglobin at 11.7.  Patient had some rectal bleed after getting soapsuds enema.  GI saw her and patient is interested in colonoscopy but mother initially declined, now seems agreeable.  She will need 2 days of Eliquis wash, last dose was on 12/09/2022 in the AM.  She will be on clear liquid today.    Assessment and Plan: * GI bleeding Patient had some rectal bleed with history of chronic constipation where she was seen in [place] recently and was given an enema.  No prior diagnosis of hemorrhoid. GI was consulted and they were recommending colonoscopy, patient herself would like to proceed but mother was initially reluctant, now at agreed to proceed so she  can go home tomorrow. No current active bleed at this time but hemoglobin decreased to 11.7, 1 point drop Last dose of Eliquis was on 12/28 in the morning -Clear liquid diet today -Monitor hemoglobin -Keep holding Eliquis  Hypokalemia - Replace and monitor  Hypomagnesemia - We will replaceme magnesium.  IBS (irritable bowel syndrome) - This could be contributing to her constipation. - We will continue Trulance and  Linzess.  GERD without esophagitis - Continue with PPI, -Stop Carafate as it can contribute with constipation  Essential hypertension - We will continue Toprol-XL.   Subjective: Patient was seen and examined today.  Did not had any more bleeding after soapsuds enema.  She thinks that she is going to have another BM.  She was able to answer most of the questions although have dysarthria.  Per mother she is pretty much independent for ADLs and walk on her knees instead of using a wheelchair.  Patient would like to proceed with colonoscopy and no mother also agrees if that can help to expedite her discharge.  Mother herself has a lot of health issues and was frustrated after staying in ED for that long.  Physical Exam: Vitals:   12/09/22 2356 12/10/22 0211 12/10/22 0852 12/10/22 1245  BP: (!) 135/98 115/79 103/66 113/67  Pulse: 88 86 85 88  Resp: 20 18 16 18   Temp:  (!) 97.5 F (36.4 C) 97.6 F (36.4 C) 98.2 F (36.8 C)  TempSrc:  Oral Oral Oral  SpO2:  100% 99% 100% 100%  Weight:      Height:       General.  Malnourished lady, in no acute distress. Pulmonary.  Lungs clear bilaterally, normal respiratory effort. CV.  Regular rate and rhythm, no JVD, rub or murmur. Abdomen.  Soft, nontender, nondistended, BS positive. CNS.  Alert and oriented .  Multiple contractures. Extremities.  No edema, no cyanosis, pulses intact and symmetrical. Psychiatry.  Appears to have some cognitive impairment but able to answer questions.  Data Reviewed: Prior data  reviewed  Family Communication: Discussed with mother at bedside  Disposition: Status is: Observation The patient remains OBS appropriate and will d/c before 2 midnights.  Planned Discharge Destination: Home  Time spent: 40 minutes  This record has been created using Systems analyst. Errors have been sought and corrected,but may not always be located. Such creation errors do not reflect on the standard of care.   Author: Lorella Nimrod, MD 12/10/2022 1:15 PM  For on call review [email or web address].

## 2022-12-10 NOTE — ED Provider Notes (Signed)
[facility] Provider Note    Event Date/Time   First MD Initiated Contact with Patient 12/09/22 2349     (approximate)   History   Abdominal Pain and Rectal Bleeding   HPI  [name] is a 41 y.o. female with history of cerebral palsy, POTS and SA node dysfunction status post pacemaker, RSD, Gitelman syndrome, chronic pain on tramadol who presents to the emergency department with rectal bleeding.  Patient has history of chronic constipation and gets enemas regularly in [place].  Mother reports patient received an enema in the [facility] emergency department for constipation on 12/04/2022 and since that time has had rectal bleeding and today started passing clots.  Patient complains of some pain in the right lower abdomen.  No vomiting.  No fever.  Patient is on Eliquis.   History provided by patient and mother.    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   RSD (reflex sympathetic dystrophy)    right foot,    RSD (reflex sympathetic dystrophy)    SA node dysfunction ([place]) 2008    Past Surgical History:  Procedure Laterality Date   ABDOMINAL HYSTERECTOMY Left 2013   Dr. Kenton Kingfisher   INSERT / REPLACE / REMOVE PACEMAKER     ORTHOPEDIC SURGERY     PACEMAKER INSERTION     PORTACATH PLACEMENT      MEDICATIONS:  Prior to Admission medications   Medication Sig Start Date End Date Taking? Authorizing Provider  ELIQUIS 5 MG TABS tablet Take 5 mg by mouth 2 (two) times daily. 08/12/22   [provider]  famotidine (PEPCID) 40 MG tablet Take 40 mg by mouth daily as needed for heartburn. 03/25/22   [provider]  lactulose (CEPHULAC) 10 g packet Take 1 packet (10  g total) by mouth 3 (three) times daily as needed (constipation). 12/05/22   Quintella Reichert, MD  LINZESS 290 MCG CAPS capsule Take 290 mcg by mouth daily. 10/26/22   [provider]  magnesium oxide (MAG-OX) 400 MG tablet Take 1 tablet (400 mg total) by mouth daily. 10/27/22   Nani Gasser, MD  methocarbamol (ROBAXIN) 500 MG tablet Take 500-1,000 mg by mouth every 6 (six) hours as needed (for back spasms).    [provider]  methylphenidate (RITALIN) 20 MG tablet Take 20 mg by mouth 3 (three) times daily. Take 1 tablet (20 mg) TID at 0800, 1200 & 2100-2200    [provider]  metoCLOPramide (REGLAN) 5 MG tablet Take 5 mg by mouth 4 (four) times daily. 11/05/22   [provider]  metoprolol succinate (TOPROL-XL) 25 MG 24 hr tablet Take 12.5 mg by mouth daily. 01/26/22   [provider]  montelukast (SINGULAIR) 10 MG tablet Take 10 mg by mouth daily as needed (allergies). 10/26/22   [provider]  pregabalin (LYRICA) 150 MG capsule Take 150 mg by mouth in the morning, at noon, and at bedtime.    [provider]  PREMARIN vaginal cream Place 1 applicator vaginally daily. 11/08/22 02/06/23  [provider]  sucralfate (CARAFATE) 1 g tablet Take 1 g by mouth 4 (four) times  daily.    [provider]  traMADol (ULTRAM) 50 MG tablet Take 50 mg by mouth every 6 (six) hours as needed for moderate pain. 09/20/13   [provider]  TRULANCE 3 MG TABS Take 1 tablet by mouth daily. 11/16/22   [provider]    Physical Exam   Triage Vital Signs: ED Triage Vitals  Enc Vitals Group     BP 12/09/22 1525 94/75     Pulse Rate 12/09/22 1525 (!) 102     Resp 12/09/22 1525 20     Temp 12/09/22 1524 98.1 F (36.7 C)     Temp Source 12/09/22 1524 Oral     SpO2 12/09/22 1525 96 %     Weight 12/09/22 1523 85 lb 15.7 oz (39 kg)     Height 12/09/22 1523 5\' 2"  (1.575 m)     Head Circumference --      Peak Flow  --      Pain Score 12/09/22 1523 9     Pain Loc --      Pain Edu? --      Excl. in [place]? --     Most recent vital signs: Vitals:   12/09/22 2356 12/10/22 0211  BP: (!) 135/98 115/79  Pulse: 88 86  Resp: 20 18  Temp:  (!) 97.5 F (36.4 C)  SpO2: 100% 99%    CONSTITUTIONAL: Alert and oriented and responds appropriately to questions.  Thin, chronically ill-appearing, appears older than stated age HEAD: Normocephalic, atraumatic EYES: Conjunctivae clear, pupils appear equal, sclera nonicteric ENT: normal nose; moist mucous membranes NECK: Supple, normal ROM CARD: RRR; S1 and S2 appreciated; no murmurs, no clicks, no rubs, no gallops RESP: Normal chest excursion without splinting or tachypnea; breath sounds clear and equal bilaterally; no wheezes, no rhonchi, no rales, no hypoxia or respiratory distress, speaking full sentences ABD/GI: Normal bowel sounds; non-distended; soft, non-tender, no rebound, no guarding, no peritoneal signs RECTAL:  Normal rectal tone, patient has a large amount of gross red blood on rectal exam without melena.  Small amount of hard stool noted in the rectum but no impaction.  No hemorrhoids appreciated. BACK: The back appears normal EXT: Normal ROM in all joints; no deformity noted, no edema; no cyanosis SKIN: Normal color for age and race; warm; no rash on exposed skin NEURO: Moves all extremities equally, normal speech PSYCH: The patient's mood and manner are appropriate.   ED Results / Procedures / Treatments   LABS: (all labs ordered are listed, but only abnormal results are displayed) Labs Reviewed  COMPREHENSIVE METABOLIC PANEL - Abnormal; Notable for the following components:      Result Value   Potassium 3.3 (*)    Glucose, Bld 119 (*)    All other components within normal limits  HEMOGLOBIN AND HEMATOCRIT, BLOOD - Abnormal; Notable for the following components:   Hemoglobin 11.7 (*)    HCT 35.5 (*)    All other components within normal  limits  PROTIME-INR - Abnormal; Notable for the following components:   Prothrombin Time 15.3 (*)    All other components within normal limits  MAGNESIUM - Abnormal; Notable for the following components:   Magnesium 1.4 (*)    All other components within normal limits  CBC  TYPE AND SCREEN     EKG:   EKG Interpretation  Date/Time:  Friday December 10 2022 02:25:39 EST Ventricular Rate:  89 PR Interval:  156 QRS Duration: 102 QT Interval:  394 QTC Calculation:  479 R Axis:   82 Text Interpretation: Atrial-paced rhythm Abnormal ECG When compared with ECG of 24-Oct-2022 04:03, No significant change was found Confirmed by Rochele Raring [phone]) on 12/10/2022 2:30:59 AM          RADIOLOGY: My personal review and interpretation of imaging: CT showed no active bleeding or other acute abnormality.  I have personally reviewed all radiology reports.   CT ANGIO GI BLEED  Result Date: 12/10/2022 CLINICAL DATA:  Lower GI bleed. EXAM: CTA ABDOMEN AND PELVIS WITHOUT AND WITH CONTRAST TECHNIQUE: Multidetector CT imaging of the abdomen and pelvis was performed using the standard protocol during bolus administration of intravenous contrast. Multiplanar reconstructed images and MIPs were obtained and reviewed to evaluate the vascular anatomy. RADIATION DOSE REDUCTION: This exam was performed according to the departmental dose-optimization program which includes automated exposure control, adjustment of the mA and/or kV according to patient size and/or use of iterative reconstruction technique. CONTRAST:  60mL OMNIPAQUE IOHEXOL 350 MG/ML SOLN COMPARISON:  CT abdomen pelvis dated 12/04/2022. FINDINGS: Evaluation is limited due to streak artifact caused by patient's arm and metallic stimulator device in the anterior abdomen. VASCULAR Aorta: Normal caliber aorta without aneurysm, dissection, vasculitis or significant stenosis. Celiac: Patent without evidence of aneurysm, dissection, vasculitis or  significant stenosis. SMA: Patent without evidence of aneurysm, dissection, vasculitis or significant stenosis. Renals: Both renal arteries are patent without evidence of aneurysm, dissection, vasculitis, fibromuscular dysplasia or significant stenosis. IMA: Patent without evidence of aneurysm, dissection, vasculitis or significant stenosis. Inflow: Patent without evidence of aneurysm, dissection, vasculitis or significant stenosis. Proximal Outflow: Bilateral common femoral and visualized portions of the superficial and profunda femoral arteries are patent without evidence of aneurysm, dissection, vasculitis or significant stenosis. Veins: The IVC is unremarkable. The SMV, splenic vein, and main portal vein are patent. No portal venous gas. Review of the MIP images confirms the above findings. NON-VASCULAR Lower chest: The visualized lung bases are clear. 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 No intra-abdominal free air or free fluid. Hepatobiliary: The liver is unremarkable as visualized. Cholecystectomy. Mild biliary dilatation. No retained calcified stone noted in the central CBD. Pancreas: The pancreas is suboptimally evaluated due to paucity of intra-abdominal fat. The tail and body of the pancreas are not seen. The head and uncinate process of pancreas appear unremarkable. Spleen: Normal in size without focal abnormality. Adrenals/Urinary Tract: The adrenal glands are unremarkable. Bilateral renal calculi as seen on the prior CT measure up to 1.5 cm in the inferior pole of the right kidney. No hydronephrosis. Multiple subcentimeter bilateral renal hypodense lesions are not characterized. The urinary bladder is grossly unremarkable. Stomach/Bowel: Oral contrast noted within the stomach which limits evaluation for GI bleed. There is large amount of stool throughout the colon. No bowel obstruction. No evidence of active GI bleed. Lymphatic: No obvious adenopathy. Evaluation however is limited due to  paucity of intra-abdominal fat and other limiting factors above. Reproductive: Hysterectomy. Other: None Musculoskeletal: No acute osseous pathology. Median sternotomy wires. IMPRESSION: 1. No acute intra-abdominal or pelvic pathology. No evidence of active GI bleed. 2. Constipation.  No bowel obstruction. 3. Bilateral renal calculi. No hydronephrosis. Electronically Signed   By: Elgie Collard [name]   On: 12/10/2022 01:47     PROCEDURES:  Critical Care performed: No    .1-3 Lead EKG Interpretation  Performed by: Hellena Pridgen, Layla Maw, DO Authorized by: Saragrace Selke, Layla Maw, DO     Interpretation: normal     ECG rate:  88  ECG rate assessment: normal     Rhythm: sinus rhythm     Ectopy: none     Conduction: normal       IMPRESSION / MDM / ASSESSMENT AND PLAN / ED COURSE  I reviewed the triage vital signs and the nursing notes.    Patient here with rectal bleeding on Eliquis after receiving enema for chronic constipation 6 days ago.  Now passing clots.  Complaining of abdominal pain.  The patient is on the cardiac monitor to evaluate for evidence of arrhythmia and/or significant heart rate changes.   DIFFERENTIAL DIAGNOSIS (includes but not limited to):   Constipation, rectal irritation from constipation, hemorrhoids, colitis, diverticular bleed, diverticulitis, appendicitis, coagulopathy, anemia   Patient's presentation is most consistent with acute presentation with potential threat to life or bodily function.   PLAN: Workup initiated from triage.  Hemoglobin of 12.7.  Normal platelets.  Potassium level slightly low at 3.3.  Will check magnesium level given history of chronic hypomagnesemia.  Will repeat H&H.  Will obtain CTA of the abdomen to evaluate for any active bleeding.  I suspect that the bleeding she is having today is from her constipation.  She is hemodynamically stable here.  Abdominal exam is benign.   MEDICATIONS GIVEN IN ED: Medications  magnesium sulfate IVPB 2 g  50 mL (2 g Intravenous New Bag/Given 12/10/22 0203)  sodium chloride 0.9 % bolus 1,000 mL (0 mLs Intravenous Stopped 12/10/22 0116)  dicyclomine (BENTYL) capsule 10 mg (10 mg Oral Given 12/10/22 0102)  potassium chloride SA (KLOR-CON M) CR tablet 40 mEq (40 mEq Oral Given 12/10/22 0102)  metoCLOPramide (REGLAN) injection 10 mg (10 mg Intravenous Given 12/10/22 0102)  iohexol (OMNIPAQUE) 350 MG/ML injection 80 mL (80 mLs Intravenous Contrast Given 12/10/22 0120)  methylphenidate (RITALIN) tablet 20 mg (20 mg Oral Given 12/10/22 0205)     ED COURSE: CT scan reviewed and interpreted by myself and the radiologist and shows no sign of active bleeding or other acute abnormality.  Her hemoglobin did drop 1 point down to 11.7.  She still has gross rectal blood on exam but has not had a bloody bowel movement here.  CT did show constipation.  Recommended bowel regimen, enema here but mother is very uncomfortable with the amount of blood she has had over the past several days and that she is passing clots.  I suspect that this is all secondary to constipation causing rectal irritation and bleeding is exacerbated by her being on Eliquis.  Mother would prefer admission.  Will discuss with hospitalist for observation to monitor H&H and perform bowel regimen to help with constipation.  Magnesium level did come back low at 1.4.  Given replacement.  EKG shows paced rhythm.   CONSULTS:  Consulted and discussed patient's case with hospitalist, Dr. Sidney Ace.  I have recommended admission and consulting physician agrees and will place admission orders.  Patient (and family if present) agree with this plan.   I reviewed all nursing notes, vitals, pertinent previous records.  All labs, EKGs, imaging ordered have been independently reviewed and interpreted by myself.    OUTSIDE RECORDS REVIEWED:  Reviewed patient's last office visit with family medicine on 11/10/2022.       FINAL CLINICAL IMPRESSION(S) / ED  DIAGNOSES   Final diagnoses:  Rectal bleeding  Constipation, unspecified constipation type  Hypomagnesemia  Hypokalemia     Rx / DC Orders   ED Discharge Orders     None  Note:  This document was prepared using Dragon voice recognition software and may include unintentional dictation errors.   Estell Dillinger, Layla Maw, DO 12/10/22 [phone]

## 2022-12-10 NOTE — Consult Note (Addendum)
[facility] GI Inpatient Consult Note   Reason for Consult: Rectal bleeding   Attending Requesting Consult: Mansy, Jan   History of Present Illness: [name] is a 41 y.o. 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   Upon presentation to the emergency room, vital signs revealed mild tachycardia and were normal.   Labs revealed hypokalemia of 3.3 with magnesium of 1.4 with otherwise normal CMP.  CBC showed a hemoglobin of 12.7 hematocrit 38.5 and later 11.7/35.5 within 9 hours.  PT was 15.3 and INR 1.2.  Blood group was O+ with negative antibody screen.  EKG as reviewed by me : Paced rhythm with a rate of 89 Imaging: Two-view chest x-ray showed no acute cardiopulmonary disease.  Exam in ER revealed soft, nondistended, nontender. Bowel sounds present. No organomegaly or mass. She had large amount of gross bright red blood on rectal examination per the ER physician without melena She received a soapsuds enema 0300.  Reports she has not had a bowel movement. Today, she feels like she needs to have a bowel movement and feels like there is some stool present in the rectum.  She is noticing crampy lower abdominal discomfort.  Some heartburn and slight  nausea, no vomiting.  Epic review 12/04/2022: She is followed by [facility] for chronic constipation and which she takes Linzess 290 mcg and Citrucel daily.  She reports she has tried many laxatives and this seems to work the best but even that does not give her regular bowel movement.  She has needed more frequent therapeutic gastrografin enemas despite tx with Linzess 290 mcg. She does not want  to use additional laxatives. She was seen in the emergency department at [facility] 12/04/2022 for 1 month history of constipation. CT demonstrates evidence of fecal retention but no obstruction or acute infectious process. Labs with mild hypokalemia, no additional concerning findings. She was treated with an enema in the emergency department with a large amount of stool returned. She did have some ongoing sensation that there was stool to come out into her rectum. DRE at this time with no evidence of impaction. Discussed home care for constipation. Provided magnesium citrate that she may take at home. Also provided prescription for lactulose if she needs additional relief.    Flexible sigmoidoscopy with Dr. Jory Ee 12/01/2020 showed normal mucosa in the rectosigmoid colon. Distal colon biopsy showed histologically unremarkable colonic mucosa.   EGD with Dr. Jory Ee 12/01/2020 showed normal esophagus, normal stomach, normal duodenum. Duodenal biopsy showed histologically unremarkable duodenal mucosa. Stomach biopsy showed histologically unremarkable oxyntic type mucosa, negative for H. pylori.    Past Medical History:  Past Medical History:  Diagnosis Date   Carpal tunnel syndrome    Cerebral palsy (HCC)    Chronic back pain    Gitelman syndrome    Hypokalemia    now resolved   Magnesium deficiency    Pacemaker    PONV (postoperative nausea and vomiting)    Postural orthostatic tachycardia syndrome    s/p pacemaker placement.   RSD (reflex sympathetic dystrophy)    right foot,    RSD (reflex sympathetic dystrophy)    SA node dysfunction (HCC) 2008    Problem List: Patient  Active Problem List   Diagnosis Date Noted   GI bleeding 12/10/2022   Essential hypertension 12/10/2022   IBS (irritable bowel syndrome) 12/10/2022   GERD without esophagitis 12/10/2022   Hypokalemia 12/10/2022   Rectal bleeding 12/10/2022   Influenza 10/25/2022   Right ovarian cyst 05/31/2015   Abdominal pain, chronic,  right lower quadrant 05/23/2015   Hypomagnesemia 03/02/2014   Back pain 03/02/2014    Past Surgical History: Past Surgical History:  Procedure Laterality Date   ABDOMINAL HYSTERECTOMY Left 2013   Dr. Wanita Chamberlain / REPLACE / REMOVE PACEMAKER     ORTHOPEDIC SURGERY     PACEMAKER INSERTION     PORTACATH PLACEMENT      Allergies: Allergies  Allergen Reactions   Duloxetine     Other reaction(s): Other (See Comments), Palpitations Other reaction(s): Hallucinations, Hyperactive behavior (finding), Other (See Comments) Stressed out     Flecainide     Other reaction(s): Other (See Comments) Other reaction(s): Hypomagnesemia (disorder), Other (See Comments) Other Reaction: QUESTION HYPOMAGNESEMIA     Fluoxetine     Other reaction(s): Nausea And Vomiting, Other (See Comments) Other reaction(s): Other (See Comments) Other Reaction: INCREASED HR  causes irritability and irrational behavior "I go crazy"   Gabapentin     Goes crazy Other reaction(s): Other (See Comments) Other reaction(s): Hyperactive behavior (finding), Mental Status Changes (intolerance), Other (See Comments) Goes crazy Hallucinations   Baclofen     Other reaction(s): Other (See Comments), Other (See Comments) Weakness  Other reaction(s): Other (See Comments) Weakness and loose Weakness  Other reaction(s):  Weakness     Cephalosporins     Other reaction(s): Hives, Rash Received pre-op vancomycin and cefazolin 07/20/16. Developed urticarial rash on bilateral lower extremities + slight periorbital swelling. Treated with diphenhydramine. Received pre-op vancomycin and cefazolin 07/20/16. Developed urticarial rash on bilateral lower extremities + slight periorbital swelling. Treated with diphenhydramine. Received pre-op vancomycin and cefazolin 07/20/16. Developed urticarial rash on bilateral lower extremities + slight periorbital swelling. Treated with diphenhydramine.   Sulfasalazine     Other reaction(s):  Nausea And Vomiting   Sulfa Antibiotics Nausea And Vomiting   Vancomycin     Other reaction(s): Hives Received pre-op vancomycin and cefazolin 07/20/16. Developed urticarial rash on bilateral lower extremities + slight periorbital swelling. Treated with diphenhydramine. Received pre-op vancomycin and cefazolin 07/20/16. Developed urticarial rash on bilateral lower extremities + slight periorbital swelling. Treated with diphenhydramine. Received pre-op vancomycin and cefazolin 07/20/16. Developed urticarial rash on bilateral lower extremities + slight periorbital swelling. Treated with diphenhydramine.     Family History: family history includes Diabetes in her mother; Hypertension in her mother.   GI Family History: Family history of GI issues: Maternal great GM with colon cancer. No first degree FH of  GI cancer or IBD.   Social History:   reports that she has never smoked. She has never used smokeless tobacco. She reports that she does not drink alcohol and does not use drugs. The patient denies ETOH, tobacco, or drug use.    Review of Systems:  Constitutional: No Wt loss.  Eyes: No changes in vision. ENT: No oral lesions, sore throat.  GI: see HPI.  Heme/Lymph: No easy bruising.  CV: No chest pain.  GU: No hematuria.  Integumentary: No rashes.  Neuro: No headaches.  Psych: No depression/anxiety.  Endocrine: No heat/cold intolerance.  Allergic/Immunologic: No urticaria.  Resp: No cough, no SOB.  Musculoskeletal: No joint swelling.     Physical Examination: BP 103/66 (BP Location:  Left Arm)   Pulse 85   Temp 97.6 F (36.4 C) (Oral)   Resp 16   Ht 5\' 2"  (1.575 m)   Wt 39 kg   LMP 06/09/2012   SpO2 100%   BMI 15.73 kg/m   Gen: NAD, alert and oriented x 4. NAD; Cerebral palsy and is participating in her plan of care HEENT: EOMI, Neck: supple, no JVD or thyromegaly Chest: CTA bilaterally, no wheezes, crackles, or other adventitious sounds CV: RRR without murmur Abd: soft,  slight lower abdomen tenderness, ND, +BS in all four quadrants; no HSM, guarding, rigidity, or rebound tenderness Ext: no edema, contractured 2/2 CP   Data: Lab Results  Component Value Date   WBC 7.4 12/09/2022   HGB 11.7 (L) 12/10/2022   HCT 35.5 (L) 12/10/2022   MCV 84.6 12/09/2022   PLT 159 12/09/2022   Recent Labs  Lab 12/04/22 2045 12/09/22 1553 12/10/22 0044  HGB 12.2 12.7 11.7*   Lab Results  Component Value Date   NA 141 12/09/2022   K 3.3 (L) 12/09/2022   CL 106 12/09/2022   CO2 23 12/09/2022   BUN 16 12/09/2022   CREATININE 0.94 12/09/2022   Lab Results  Component Value Date   ALT 22 12/09/2022   AST 27 12/09/2022   ALKPHOS 82 12/09/2022   BILITOT 0.4 12/09/2022   Recent Labs  Lab 12/10/22 0044  INR 1.2      Latest Ref Rng & Units 12/10/2022   12:44 AM 12/09/2022    3:53 PM 12/04/2022    8:45 PM  CBC  WBC 4.0 - 10.5 K/uL  7.4  6.8   Hemoglobin 12.0 - 15.0 g/dL 12/06/2022  72.0  94.7   Hematocrit 36.0 - 46.0 % 35.5  38.5  36.6   Platelets 150 - 400 K/uL  159  161     STUDIES: CT ANGIO GI BLEED  Result Date: 12/10/2022 CLINICAL DATA:  Lower GI bleed. EXAM: CTA ABDOMEN AND PELVIS WITHOUT AND WITH CONTRAST TECHNIQUE: Multidetector CT imaging of the abdomen and pelvis was performed using the standard protocol during bolus administration of intravenous contrast. Multiplanar reconstructed images and MIPs were obtained and reviewed to evaluate the vascular anatomy. RADIATION DOSE REDUCTION: This exam was performed according to the departmental dose-optimization program which includes automated exposure control, adjustment of the mA and/or kV according to patient size and/or use of iterative reconstruction technique. CONTRAST:  56mL OMNIPAQUE IOHEXOL 350 MG/ML SOLN COMPARISON:  CT abdomen pelvis dated 12/04/2022. FINDINGS: Evaluation is limited due to streak artifact caused by patient's arm and metallic stimulator device in the anterior abdomen. VASCULAR Aorta:  Normal caliber aorta without aneurysm, dissection, vasculitis or significant stenosis. Celiac: Patent without evidence of aneurysm, dissection, vasculitis or significant stenosis. SMA: Patent without evidence of aneurysm, dissection, vasculitis or significant stenosis. Renals: Both renal arteries are patent without evidence of aneurysm, dissection, vasculitis, fibromuscular dysplasia or significant stenosis. IMA: Patent without evidence of aneurysm, dissection, vasculitis or significant stenosis. Inflow: Patent without evidence of aneurysm, dissection, vasculitis or significant stenosis. Proximal Outflow: Bilateral common femoral and visualized portions of the superficial and profunda femoral arteries are patent without evidence of aneurysm, dissection, vasculitis or significant stenosis. Veins: The IVC is unremarkable. The SMV, splenic vein, and main portal vein are patent. No portal venous gas. Review of the MIP images confirms the above findings. NON-VASCULAR Lower chest: The visualized lung bases are clear. 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 No intra-abdominal free air or free fluid. Hepatobiliary:  The liver is unremarkable as visualized. Cholecystectomy. Mild biliary dilatation. No retained calcified stone noted in the central CBD. Pancreas: The pancreas is suboptimally evaluated due to paucity of intra-abdominal fat. The tail and body of the pancreas are not seen. The head and uncinate process of pancreas appear unremarkable. Spleen: Normal in size without focal abnormality. Adrenals/Urinary Tract: The adrenal glands are unremarkable. Bilateral renal calculi as seen on the prior CT measure up to 1.5 cm in the inferior pole of the right kidney. No hydronephrosis. Multiple subcentimeter bilateral renal hypodense lesions are not characterized. The urinary bladder is grossly unremarkable. Stomach/Bowel: Oral contrast noted within the stomach which limits evaluation for GI bleed. There is large amount  of stool throughout the colon. No bowel obstruction. No evidence of active GI bleed. Lymphatic: No obvious adenopathy. Evaluation however is limited due to paucity of intra-abdominal fat and other limiting factors above. Reproductive: Hysterectomy. Other: None Musculoskeletal: No acute osseous pathology. Median sternotomy wires. IMPRESSION: 1. No acute intra-abdominal or pelvic pathology. No evidence of active GI bleed. 2. Constipation.  No bowel obstruction. 3. Bilateral renal calculi. No hydronephrosis. Electronically Signed   By: Elgie CollardArash  Radparvar [name]   On: 12/10/2022 01:47   @IMAGES @  Assessment:s a 41 y.o. 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   # LGI bleed: - Hemoglobin is at baseline, down 1 point with 1 L fluid hydration.  -Anticoagulated on Eliquis with last dose  # Chronic constipation: - Severe constipation accompanying, etiology to consider stercoral colitis, hemorrhoidal in nature -Soapsuds enema provided, no active rectal bleeding.   -Laxatives   #hypokalemia  # GERD # DVT prophylaxis   Recommendations: - Consider colonoscopy while inpatient. She has had FS in 2021- no hx of csy.  The patient and her mother are discussing the possibility of this.  May need to postpone the procedure until Monday given Eliquis anticoagulation.   - Monitor H&H.  Currently stable  Avoid frequent lab draws to prevent lab induced anemia  Supportive care and antiemetics per primary team  Maintain 2 IVs site access  Avoid NSAIDs  Monitor for GI bleed  Continue PPI therapy.  She presents on Carafate therapy- consider utility of this  medication as it constipating.   -  Electrolyte correction per primary team  - Eliquis on hold now for GI B  Thank you for the consult. Please call with questions or concerns.  Amedeo KinsmanKimberly Reba Hulett, Fransico SettersKim Corita Allinson, ANP [facility] [address] [place], [place] [id number] [phone]  12/10/2022 9:28 AM

## 2022-12-10 NOTE — Hospital Course (Addendum)
Taken from H&P.  [name] is a 41 y.o. 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  She has been having intermittent bleeding since then and her mother has noticed blood clots.  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   D Course: When the patient came to the ER, heart rate was 102 with otherwise normal vital signs.  Labs revealed hypokalemia of 3.3 with magnesium of 1.4 with otherwise normal CMP.  CBC showed a hemoglobin of 12.7 hematocrit 38.5 and later 11.7/35.5 within 9 hours.  PT was 15.3 and INR 1.2.  Blood group was O+ with negative antibody screen.   EKG ,  Paced rhythm with a rate of 89 Imaging: Two-view chest x-ray showed no acute cardiopulmonary disease. GI was consulted and Eliquis was held.  12/29: Vitals stable and hemoglobin at 11.7.  Patient had some rectal bleed after getting soapsuds enema.  GI saw her and patient is interested in colonoscopy but mother initially declined, now seems agreeable.  She will need 2 days of Eliquis wash, last dose was on 12/09/2022 in the AM.  She will be on clear liquid today.  12/30: Vitals stable.  Magnesium and potassium has been normalized with magnesium of 2.1 and potassium of 4.  IV fluid with potassium supplement was discontinued.  Hemoglobin stable at 11.5 Patient had unsuccessful attempt of colonoscopy today at due to fecal impaction.  GI is planning to repeat tomorrow after more bowel prep.  She has NG tube in place as unable to drink much of that prep and is being given colon prep through NG tube.  No more active bleeding at this time.

## 2022-12-10 NOTE — Assessment & Plan Note (Addendum)
-   Continue with PPI, -Stop Carafate as it can contribute with constipation

## 2022-12-10 NOTE — Assessment & Plan Note (Signed)
-   We will replaceme magnesium.

## 2022-12-10 NOTE — Assessment & Plan Note (Addendum)
Patient had some rectal bleed with history of chronic constipation where she was seen in [place] [facility] recently and was given an enema.  No prior diagnosis of hemorrhoid. GI was consulted and they were recommending colonoscopy, patient herself would like to proceed but mother was initially reluctant, now at agreed to proceed so she can go home tomorrow. No current active bleed at this time but hemoglobin decreased to 11.7, 1 point drop Last dose of Eliquis was on 12/28 in the morning -Clear liquid diet today -Monitor hemoglobin -Keep holding Eliquis

## 2022-12-10 NOTE — Assessment & Plan Note (Addendum)
-   This could be contributing to her constipation. - We will continue Trulance and  Linzess.

## 2022-12-10 NOTE — Assessment & Plan Note (Addendum)
-   Replace and monitor

## 2022-12-11 ENCOUNTER — Observation Stay: Payer: Medicaid Other | Admitting: Certified Registered Nurse Anesthetist

## 2022-12-11 ENCOUNTER — Encounter: Payer: Self-pay | Admitting: Family Medicine

## 2022-12-11 ENCOUNTER — Encounter
Admission: EM | Disposition: A | Discharge status: Home / Self Care | Payer: Self-pay | Source: Home / Self Care | Attending: Internal Medicine

## 2022-12-11 DIAGNOSIS — E876 Hypokalemia: Secondary | ICD-10-CM

## 2022-12-11 DIAGNOSIS — K219 Gastro-esophageal reflux disease without esophagitis: Secondary | ICD-10-CM

## 2022-12-11 DIAGNOSIS — Z681 Body mass index (BMI) 19 or less, adult: Secondary | ICD-10-CM | POA: Diagnosis not present

## 2022-12-11 DIAGNOSIS — D6832 Hemorrhagic disorder due to extrinsic circulating anticoagulants: Secondary | ICD-10-CM | POA: Diagnosis present

## 2022-12-11 DIAGNOSIS — K5900 Constipation, unspecified: Secondary | ICD-10-CM

## 2022-12-11 DIAGNOSIS — M549 Dorsalgia, unspecified: Secondary | ICD-10-CM | POA: Diagnosis present

## 2022-12-11 DIAGNOSIS — Z950 Presence of cardiac pacemaker: Secondary | ICD-10-CM | POA: Diagnosis not present

## 2022-12-11 DIAGNOSIS — K644 Residual hemorrhoidal skin tags: Secondary | ICD-10-CM | POA: Diagnosis present

## 2022-12-11 DIAGNOSIS — G90A Postural orthostatic tachycardia syndrome (POTS): Secondary | ICD-10-CM | POA: Diagnosis present

## 2022-12-11 DIAGNOSIS — E43 Unspecified severe protein-calorie malnutrition: Secondary | ICD-10-CM | POA: Diagnosis present

## 2022-12-11 DIAGNOSIS — G809 Cerebral palsy, unspecified: Secondary | ICD-10-CM | POA: Diagnosis present

## 2022-12-11 DIAGNOSIS — G894 Chronic pain syndrome: Secondary | ICD-10-CM | POA: Diagnosis present

## 2022-12-11 DIAGNOSIS — Z79899 Other long term (current) drug therapy: Secondary | ICD-10-CM | POA: Diagnosis not present

## 2022-12-11 DIAGNOSIS — Z90710 Acquired absence of both cervix and uterus: Secondary | ICD-10-CM | POA: Diagnosis not present

## 2022-12-11 DIAGNOSIS — I10 Essential (primary) hypertension: Secondary | ICD-10-CM | POA: Diagnosis present

## 2022-12-11 DIAGNOSIS — Z7901 Long term (current) use of anticoagulants: Secondary | ICD-10-CM | POA: Diagnosis not present

## 2022-12-11 DIAGNOSIS — K582 Mixed irritable bowel syndrome: Secondary | ICD-10-CM

## 2022-12-11 DIAGNOSIS — K625 Hemorrhage of anus and rectum: Secondary | ICD-10-CM | POA: Diagnosis not present

## 2022-12-11 DIAGNOSIS — I495 Sick sinus syndrome: Secondary | ICD-10-CM | POA: Diagnosis present

## 2022-12-11 DIAGNOSIS — K5641 Fecal impaction: Secondary | ICD-10-CM | POA: Diagnosis present

## 2022-12-11 DIAGNOSIS — K640 First degree hemorrhoids: Secondary | ICD-10-CM | POA: Diagnosis present

## 2022-12-11 DIAGNOSIS — Z79890 Hormone replacement therapy (postmenopausal): Secondary | ICD-10-CM | POA: Diagnosis not present

## 2022-12-11 DIAGNOSIS — G90521 Complex regional pain syndrome I of right lower limb: Secondary | ICD-10-CM | POA: Diagnosis present

## 2022-12-11 DIAGNOSIS — R471 Dysarthria and anarthria: Secondary | ICD-10-CM | POA: Diagnosis present

## 2022-12-11 HISTORY — PX: COLONOSCOPY WITH PROPOFOL: SHX5780

## 2022-12-11 LAB — MAGNESIUM
Magnesium: 2.1 mg/dL (ref 1.7–2.4)
Magnesium: 1.3 mg/dL — ABNORMAL LOW (ref 1.7–2.4)

## 2022-12-11 LAB — CBC
HCT: 33.9 % — ABNORMAL LOW (ref 36.0–46.0)
Hemoglobin: 11.5 g/dL — ABNORMAL LOW (ref 12.0–15.0)
MCH: 28.5 pg (ref 26.0–34.0)
MCHC: 33.9 g/dL (ref 30.0–36.0)
MCV: 83.9 fL (ref 80.0–100.0)
Platelets: 146 10*3/uL — ABNORMAL LOW (ref 150–400)
RBC: 4.04 MIL/uL (ref 3.87–5.11)
RDW: 13.7 % (ref 11.5–15.5)
WBC: 7.1 10*3/uL (ref 4.0–10.5)
nRBC: 0 % (ref 0.0–0.2)

## 2022-12-11 LAB — PROTIME-INR
INR: 1.1 (ref 0.8–1.2)
Prothrombin Time: 14.4 seconds (ref 11.4–15.2)

## 2022-12-11 LAB — BASIC METABOLIC PANEL
Anion gap: 7 (ref 5–15)
BUN: 13 mg/dL (ref 6–20)
CO2: 27 mmol/L (ref 22–32)
Calcium: 9.4 mg/dL (ref 8.9–10.3)
Chloride: 106 mmol/L (ref 98–111)
Creatinine, Ser: 0.82 mg/dL (ref 0.44–1.00)
GFR, Estimated: >60 mL/min (ref >60)
Glucose, Bld: 111 mg/dL — ABNORMAL HIGH (ref 70–99)
Potassium: 4 mmol/L (ref 3.5–5.1)
Sodium: 140 mmol/L (ref 135–145)

## 2022-12-11 LAB — CBG MONITORING, ED
Glucose-Capillary: 101 mg/dL — ABNORMAL HIGH (ref 70–99)
Glucose-Capillary: 98 mg/dL (ref 70–99)

## 2022-12-11 LAB — GLUCOSE, CAPILLARY: Glucose-Capillary: 70 mg/dL (ref 70–99)

## 2022-12-11 SURGERY — COLONOSCOPY WITH PROPOFOL
Anesthesia: General

## 2022-12-11 MED ORDER — LINACLOTIDE 290 MCG PO CAPS
290.0000 ug | ORAL_CAPSULE | Freq: Every day | ORAL | Status: DC
  Filled 2022-12-11: qty 2

## 2022-12-11 MED ORDER — MAGNESIUM SULFATE 2 GM/50ML IV SOLN
2.0000 g | Freq: Once | INTRAVENOUS | Status: AC
  Administered 2022-12-12: 2 g via INTRAVENOUS
  Filled 2022-12-11: qty 50

## 2022-12-11 MED ORDER — LINACLOTIDE 290 MCG PO CAPS
290.0000 ug | ORAL_CAPSULE | Freq: Every day | ORAL | Status: DC
  Administered 2022-12-11 – 2022-12-12 (×2): 290 ug via ORAL
  Filled 2022-12-11 (×2): qty 1

## 2022-12-11 MED ORDER — PEG 3350-KCL-NA BICARB-NACL 420 G PO SOLR
4000.0000 mL | Freq: Once | ORAL | Status: DC
  Filled 2022-12-11: qty 4000

## 2022-12-11 MED ORDER — METHYLPHENIDATE HCL 10 MG PO TABS: 20.0000 mg | ORAL_TABLET | Freq: Three times a day (TID) | ORAL | Status: DC

## 2022-12-11 MED ORDER — SODIUM CHLORIDE 0.9 % IV SOLN: INTRAVENOUS | Status: DC

## 2022-12-11 MED ORDER — PROPOFOL 500 MG/50ML IV EMUL
INTRAVENOUS | Status: DC | PRN
  Administered 2022-12-11: 120 ug/kg/min via INTRAVENOUS

## 2022-12-11 MED ORDER — LIDOCAINE 2% (20 MG/ML) 5 ML SYRINGE
INTRAMUSCULAR | Status: DC | PRN
  Administered 2022-12-11: 20 mg via INTRAVENOUS

## 2022-12-11 MED ORDER — SODIUM CHLORIDE 0.9 % IV SOLN: INTRAVENOUS | Status: DC | PRN

## 2022-12-11 MED ORDER — CHLORHEXIDINE GLUCONATE CLOTH 2 % EX PADS
6.0000 | MEDICATED_PAD | Freq: Every day | CUTANEOUS | Status: DC
  Administered 2022-12-13: 6 via TOPICAL

## 2022-12-11 MED ORDER — METOCLOPRAMIDE HCL 5 MG PO TABS
5.0000 mg | ORAL_TABLET | Freq: Four times a day (QID) | ORAL | Status: DC
  Filled 2022-12-11 (×9): qty 1

## 2022-12-11 MED ORDER — MAGNESIUM SULFATE 4 GM/100ML IV SOLN
4.0000 g | Freq: Once | INTRAVENOUS | Status: AC
  Administered 2022-12-11: 4 g via INTRAVENOUS
  Filled 2022-12-11: qty 100

## 2022-12-11 MED ORDER — PROPOFOL 10 MG/ML IV BOLUS
INTRAVENOUS | Status: DC | PRN
  Administered 2022-12-11: 50 mg via INTRAVENOUS

## 2022-12-11 MED ORDER — METOCLOPRAMIDE HCL 5 MG PO TABS
5.0000 mg | ORAL_TABLET | Freq: Four times a day (QID) | ORAL | Status: DC
  Filled 2022-12-11 (×2): qty 1

## 2022-12-11 MED ORDER — METHYLPHENIDATE HCL 10 MG PO TABS
20.0000 mg | ORAL_TABLET | Freq: Three times a day (TID) | ORAL | Status: DC
  Administered 2022-12-11 – 2022-12-13 (×7): 20 mg via ORAL
  Filled 2022-12-11 (×7): qty 2

## 2022-12-11 NOTE — Assessment & Plan Note (Signed)
Patient had some rectal bleed with history of chronic constipation where she was seen in [place] [facility] recently and was given an enema.  No prior diagnosis of hemorrhoid. GI was consulted and they were recommending colonoscopy, patient herself would like to proceed but mother was initially reluctant, now at agreed to proceed so she can go home tomorrow. No current active bleed at this time and hemoglobin at 11.5, 1 point drop since admission Last dose of Eliquis was on 12/28 in the morning Had unsuccessful colonoscopy attempt today, concern of fecal impaction.  GI to retry with more colon prep tomorrow -Monitor hemoglobin -Keep holding Eliquis

## 2022-12-11 NOTE — Progress Notes (Signed)
Report called to Thayer Ohm, RN on 1C.  Patient is assigned to room 130.  Golytely to be started this afternoon via NGT placed yesterday for colon prep to be given.  Patient is aware of the plan, Dr Allegra Lai is aware of bed assignment and that Golytely to be started this afternoon on inpatient unit.

## 2022-12-11 NOTE — Anesthesia Preprocedure Evaluation (Addendum)
Anesthesia Evaluation  Patient identified by MRN, date of birth, ID band Patient awake    Reviewed: Allergy & Precautions, NPO status , Patient's Chart, lab work & pertinent test results, reviewed documented beta blocker date and time   History of Anesthesia Complications (+) PONV and history of anesthetic complications  Airway Mallampati: II  TM Distance: >3 FB Neck ROM: Full    Dental no notable dental hx.    Pulmonary neg sleep apnea, neg COPD, Patient abstained from smoking.Not current smoker   Pulmonary exam normal breath sounds clear to auscultation       Cardiovascular Exercise Tolerance: Good hypertension, Pt. on medications (-) Past MI, (-) Cardiac Stents and (-) CHF Normal cardiovascular exam(-) dysrhythmias + pacemaker  Rhythm:Regular Rate:Normal     Neuro/Psych neg Seizures CP but able to communicate and consent  Neuromuscular disease    GI/Hepatic negative GI ROS, Neg liver ROS,neg GERD  ,,  Endo/Other  negative endocrine ROSneg diabetes    Renal/GU Renal disease     Musculoskeletal   Abdominal   Peds  Hematology negative hematology ROS (+)   Anesthesia Other Findings Past Medical History: No date: Carpal tunnel syndrome No date: Cerebral palsy (HCC) No date: Chronic back pain No date: Gitelman syndrome No date: Hypokalemia- gitelman dz     Comment:  now resolved No date: Magnesium deficiency No date: Pacemaker No date: PONV (postoperative nausea and vomiting) No date: Postural orthostatic tachycardia syndrome     Comment:  s/p pacemaker placement. No date: RSD (reflex sympathetic dystrophy)     Comment:  right foot,  No date: RSD (reflex sympathetic dystrophy) 2008: SA node dysfunction (HCC)   Reproductive/Obstetrics                             Anesthesia Physical Anesthesia Plan  ASA: 3 and emergent  Anesthesia Plan: MAC   Post-op Pain Management:     Induction: Intravenous  PONV Risk Score and Plan:   Airway Management Planned: Natural Airway and Nasal Cannula  Additional Equipment:   Intra-op Plan:   Post-operative Plan:   Informed Consent: I have reviewed the patients History and Physical, chart, labs and discussed the procedure including the risks, benefits and alternatives for the proposed anesthesia with the patient or authorized representative who has indicated his/her understanding and acceptance.     Dental Advisory Given  Plan Discussed with: Anesthesiologist, CRNA and Surgeon  Anesthesia Plan Comments: (Patient consented for risks of anesthesia including but not limited to:  - adverse reactions to medications - damage to eyes, teeth, lips or other oral mucosa - nerve damage due to positioning  - sore throat or hoarseness - Damage to heart, brain, nerves, lungs, other parts of body or loss of life  Patient voiced understanding.)       Anesthesia Quick Evaluation

## 2022-12-11 NOTE — Assessment & Plan Note (Signed)
Resolved with repletion. ?

## 2022-12-11 NOTE — Progress Notes (Signed)
Dr Allegra Lai aborted procedure due to solid stool noted in colon.  Will reschedule for Sunday, December 12, 2022 and repeat colon prep.  Dr Allegra Lai spoke with the patient regarding procedure.

## 2022-12-11 NOTE — Assessment & Plan Note (Signed)
Resolved with repletion. -Continue to monitor 

## 2022-12-11 NOTE — Anesthesia Postprocedure Evaluation (Signed)
Anesthesia Post Note  Patient: [name]  Procedure(s) Performed: COLONOSCOPY WITH PROPOFOL  Patient location during evaluation: PACU Anesthesia Type: MAC Level of consciousness: awake and alert Pain management: pain level controlled Vital Signs Assessment: post-procedure vital signs reviewed and stable Respiratory status: spontaneous breathing, nonlabored ventilation, respiratory function stable and patient connected to nasal cannula oxygen Cardiovascular status: stable and blood pressure returned to baseline Postop Assessment: no apparent nausea or vomiting Anesthetic complications: no   No notable events documented.   Last Vitals:  Vitals:   12/11/22 1201 12/11/22 1211  BP: 116/84 110/72  Pulse: 92 85  Resp: 16 16  Temp:    SpO2: 100% 100%    Last Pain:  Vitals:   12/11/22 1201  TempSrc:   PainSc: 5                  Tonny Bollman

## 2022-12-11 NOTE — Assessment & Plan Note (Signed)
-   We will continue Toprol-XL. 

## 2022-12-11 NOTE — Op Note (Signed)
[facility] Gastroenterology Patient Name: [name] Procedure Date: 12/11/2022 11:20 AM MRN: [id number] Account #: [id number] Date of Birth: Mar 21, 1981 Admit Type: Inpatient Age: 41 Room: [facility] ENDO ROOM 4 Gender: Female Note Status: Finalized Instrument Name: Peds Colonoscope [id number] Procedure:             Colonoscopy Indications:           This is the patient's first colonoscopy, Rectal                         bleeding Providers:             Lin Landsman MD, MD Referring MD:          No Local Md, MD (Referring MD) Medicines:             General Anesthesia Complications:         No immediate complications. Estimated blood loss: None. Procedure:             Pre-Anesthesia Assessment:                        - Prior to the procedure, a History and Physical was                         performed, and patient medications and allergies were                         reviewed. The patient is competent. The risks and                         benefits of the procedure and the sedation options and                         risks were discussed with the patient. All questions                         were answered and informed consent was obtained.                         Patient identification and proposed procedure were                         verified by the physician, the nurse, the                         anesthesiologist, the anesthetist and the technician                         in the pre-procedure area in the procedure room in the                         endoscopy suite. Mental Status Examination: alert and                         oriented. Airway Examination: normal oropharyngeal                         airway and neck mobility. Respiratory Examination:  clear to auscultation. CV Examination: normal.                         Prophylactic Antibiotics: The patient does not require                         prophylactic antibiotics. Prior  Anticoagulants: The                         patient has taken no anticoagulant or antiplatelet                         agents. ASA Grade Assessment: III - A patient with                         severe systemic disease. After reviewing the risks and                         benefits, the patient was deemed in satisfactory                         condition to undergo the procedure. The anesthesia                         plan was to use general anesthesia. Immediately prior                         to administration of medications, the patient was                         re-assessed for adequacy to receive sedatives. The                         heart rate, respiratory rate, oxygen saturations,                         blood pressure, adequacy of pulmonary ventilation, and                         response to care were monitored throughout the                         procedure. The physical status of the patient was                         re-assessed after the procedure.                        After obtaining informed consent, the colonoscope was                         passed under direct vision. Throughout the procedure,                         the patient's blood pressure, pulse, and oxygen                         saturations were monitored continuously. The procedure  was aborted. The colonscope was not inserted.                         Medications were given. The colonoscopy was extremely                         difficult due to poor bowel prep with stool present.                         The colonoscopy was aborted due to the extreme                         difficulty of the procedure. The quality of the bowel                         preparation was poor. Findings:      The perianal and digital rectal examinations were normal. Pertinent       negatives include normal sphincter tone and no palpable rectal lesions.      Hard lumpy stools in the rectum and blood tinged  thick liquid on the       surface of stool      Procedure aborted Impression:            - The procedure was aborted due to the extreme                         difficulty of the procedure.                        - Preparation of the colon was poor.                        - No specimens collected. Recommendation:        - Return patient to hospital ward for ongoing care.                        - Clear liquid diet today.                        - Repeat colonoscopy tentatively tomorrow because the                         bowel preparation was suboptimal.                        - Administer bowel prep today with golytely via NG tube Diagnosis Code(s):     --- Professional ---                        K62.5, Hemorrhage of anus and rectum Dr. Ulyess Mort Lin Landsman MD, MD 12/11/2022 11:48:59 AM This report has been signed electronically. Number of Addenda: 0 Note Initiated On: 12/11/2022 11:20 AM Total Procedure Duration: 0 hours 0 minutes 6 seconds  Estimated Blood Loss:  Estimated blood loss: none.      [facility]

## 2022-12-11 NOTE — Progress Notes (Addendum)
Patient has had first bowel movement since bowel prep has started. See chart for description.    0630-patient completed bowel prep and had 3 consecutive bowel movements after the first one. Pericare performed and patient changed each time.

## 2022-12-11 NOTE — Transfer of Care (Signed)
Immediate Anesthesia Transfer of Care Note  Patient: [name]  Procedure(s) Performed: COLONOSCOPY WITH PROPOFOL  Patient Location: PACU  Anesthesia Type:General  Level of Consciousness: awake and alert   Airway & Oxygen Therapy: Patient Spontanous Breathing  Post-op Assessment: Report given to RN and Post -op Vital signs reviewed and stable  Post vital signs: Reviewed  Last Vitals:  Vitals Value Taken Time  BP    Temp    Pulse    Resp    SpO2      Last Pain:  Vitals:   12/11/22 0403  TempSrc: Oral  PainSc:          Complications: No notable events documented.

## 2022-12-11 NOTE — Progress Notes (Signed)
Progress Note   Patient: [name] VOH:[id number] DOB: [birth year] DOA: 12/09/2022     0 DOS: the patient was seen and examined on 12/11/2022   Brief hospital course: Taken from H&P.  [name] is a 41 y.o. 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  She has been having intermittent bleeding since then and her mother has noticed blood clots.  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   D Course: When the patient came to the ER, heart rate was 102 with otherwise normal vital signs.  Labs revealed hypokalemia of 3.3 with magnesium of 1.4 with otherwise normal CMP.  CBC showed a hemoglobin of 12.7 hematocrit 38.5 and later 11.7/35.5 within 9 hours.  PT was 15.3 and INR 1.2.  Blood group was O+ with negative antibody screen.   EKG ,  Paced rhythm with a rate of 89 Imaging: Two-view chest x-ray showed no acute cardiopulmonary disease. GI was consulted and Eliquis was held.  12/29: Vitals stable and hemoglobin at 11.7.  Patient had some rectal bleed after getting soapsuds enema.  GI saw her and patient is interested in colonoscopy but mother initially declined, now seems agreeable.  She will need 2 days of Eliquis wash, last dose was on 12/09/2022 in the AM.  She will be on clear liquid today.  12/30: Vitals stable.  Magnesium and potassium has been normalized with magnesium of 2.1 and potassium of 4.  IV fluid with potassium supplement was discontinued.  Hemoglobin stable at 11.5 Patient had unsuccessful attempt of colonoscopy today at due to fecal impaction.  GI is planning to repeat tomorrow after more bowel prep.  She has NG tube in place as unable to drink  much of that prep and is being given colon prep through NG tube.  No more active bleeding at this time.   Assessment and Plan: * GI bleeding Patient had some rectal bleed with history of chronic constipation where she was seen in [place] [facility] recently and was given an enema.  No prior diagnosis of hemorrhoid. GI was consulted and they were recommending colonoscopy, patient herself would like to proceed but mother was initially reluctant, now at agreed to proceed so she can go home tomorrow. No current active bleed at this time and hemoglobin at 11.5, 1 point drop since admission Last dose of Eliquis was on 12/28 in the morning Had unsuccessful colonoscopy attempt today, concern of fecal impaction.  GI to retry with more colon prep tomorrow -Monitor hemoglobin -Keep holding Eliquis  Hypokalemia Resolved with repletion. -Continue to monitor  Hypomagnesemia Resolved with repletion  IBS (irritable bowel syndrome) - This could be contributing to her constipation. - We will continue Trulance and  Linzess.  GERD without esophagitis - Continue with PPI, -Stop Carafate as it can contribute with constipation  Essential hypertension - We will continue Toprol-XL.   Subjective: Patient was going for her colonoscopy when seen today.  No new complaints.  No more active bleeding.  NG tube in place.  Physical Exam: Vitals:   12/11/22 1151 12/11/22 1201 12/11/22 1211 12/11/22 1345  BP: 110/79 116/84 110/72 110/70  Pulse: 85 92 85 84  Resp: 18 16 16  18  Temp: (!) 97.2 F (36.2 C)   98.2 F (36.8 C)  TempSrc: Temporal     SpO2: 100% 100% 100% 100%  Weight:      Height:       General.   In no acute distress.  NG tube in place Pulmonary.  Lungs clear bilaterally, normal respiratory effort. CV.  Regular rate and rhythm, no JVD, rub or murmur. Abdomen.  Soft, nontender, nondistended, BS positive. CNS.  Alert and oriented .  Multiple contractures Extremities.  No edema, no cyanosis, pulses  intact and symmetrical.  Data Reviewed: Prior data reviewed  Family Communication: Discussed with mother at bedside  Disposition: Status is: Observation The patient remains OBS appropriate and will d/c before 2 midnights.  Planned Discharge Destination: Home  Time spent: 40 minutes  This record has been created using Systems analyst. Errors have been sought and corrected,but may not always be located. Such creation errors do not reflect on the standard of care.   Author: Lorella Nimrod, MD 12/11/2022 2:51 PM  For on call review [email or web address].

## 2022-12-12 DIAGNOSIS — K5900 Constipation, unspecified: Secondary | ICD-10-CM | POA: Diagnosis not present

## 2022-12-12 DIAGNOSIS — E876 Hypokalemia: Secondary | ICD-10-CM | POA: Diagnosis not present

## 2022-12-12 DIAGNOSIS — K625 Hemorrhage of anus and rectum: Secondary | ICD-10-CM | POA: Diagnosis not present

## 2022-12-12 LAB — BASIC METABOLIC PANEL
Anion gap: 8 (ref 5–15)
BUN: 9 mg/dL (ref 6–20)
CO2: 29 mmol/L (ref 22–32)
Calcium: 8.7 mg/dL — ABNORMAL LOW (ref 8.9–10.3)
Chloride: 103 mmol/L (ref 98–111)
Creatinine, Ser: 0.67 mg/dL (ref 0.44–1.00)
GFR, Estimated: >60 mL/min (ref >60)
Glucose, Bld: 97 mg/dL (ref 70–99)
Potassium: 3.3 mmol/L — ABNORMAL LOW (ref 3.5–5.1)
Sodium: 140 mmol/L (ref 135–145)

## 2022-12-12 LAB — CBC
HCT: 32.7 % — ABNORMAL LOW (ref 36.0–46.0)
Hemoglobin: 11 g/dL — ABNORMAL LOW (ref 12.0–15.0)
MCH: 27.7 pg (ref 26.0–34.0)
MCHC: 33.6 g/dL (ref 30.0–36.0)
MCV: 82.4 fL (ref 80.0–100.0)
Platelets: 125 10*3/uL — ABNORMAL LOW (ref 150–400)
RBC: 3.97 MIL/uL (ref 3.87–5.11)
RDW: 13.3 % (ref 11.5–15.5)
WBC: 5.9 10*3/uL (ref 4.0–10.5)
nRBC: 0 % (ref 0.0–0.2)

## 2022-12-12 LAB — GLUCOSE, CAPILLARY
Glucose-Capillary: 110 mg/dL — ABNORMAL HIGH (ref 70–99)
Glucose-Capillary: 106 mg/dL — ABNORMAL HIGH (ref 70–99)
Glucose-Capillary: 82 mg/dL (ref 70–99)
Glucose-Capillary: 88 mg/dL (ref 70–99)
Glucose-Capillary: 97 mg/dL (ref 70–99)

## 2022-12-12 LAB — MAGNESIUM: Magnesium: 2.7 mg/dL — ABNORMAL HIGH (ref 1.7–2.4)

## 2022-12-12 MED ORDER — LACTATED RINGERS IV SOLN: INTRAVENOUS | Status: DC

## 2022-12-12 MED ORDER — POTASSIUM CHLORIDE 20 MEQ PO PACK
40.0000 meq | PACK | Freq: Once | ORAL | Status: AC
  Administered 2022-12-12: 40 meq
  Filled 2022-12-12: qty 2

## 2022-12-12 MED ORDER — PEG 3350-KCL-NA BICARB-NACL 420 G PO SOLR
4000.0000 mL | Freq: Once | ORAL | Status: AC | PRN
  Administered 2022-12-13: 4000 mL via ORAL
  Filled 2022-12-12: qty 4000

## 2022-12-12 NOTE — Progress Notes (Signed)
Progress Note   Patient: [name] OAC:[id number] DOB: 03-27-1981 DOA: 12/09/2022     1 DOS: the patient was seen and examined on 12/12/2022   Brief hospital course: Taken from H&P.  [name] is a 41 y.o. 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  She has been having intermittent bleeding since then and her mother has noticed blood clots.  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   D Course: When the patient came to the ER, heart rate was 102 with otherwise normal vital signs.  Labs revealed hypokalemia of 3.3 with magnesium of 1.4 with otherwise normal CMP.  CBC showed a hemoglobin of 12.7 hematocrit 38.5 and later 11.7/35.5 within 9 hours.  PT was 15.3 and INR 1.2.  Blood group was O+ with negative antibody screen.   EKG ,  Paced rhythm with a rate of 89 Imaging: Two-view chest x-ray showed no acute cardiopulmonary disease. GI was consulted and Eliquis was held.  12/29: Vitals stable and hemoglobin at 11.7.  Patient had some rectal bleed after getting soapsuds enema.  GI saw her and patient is interested in colonoscopy but mother initially declined, now seems agreeable.  She will need 2 days of Eliquis wash, last dose was on 12/09/2022 in the AM.  She will be on clear liquid today.  12/30: Vitals stable.  Magnesium and potassium has been normalized with magnesium of 2.1 and potassium of 4.  IV fluid with potassium supplement was discontinued.  Hemoglobin stable at 11.5 Patient had unsuccessful attempt of colonoscopy today at due to fecal impaction.  GI is planning to repeat tomorrow after more bowel prep.  She has NG tube in place as unable to drink  much of that prep and is being given colon prep through NG tube.  No more active bleeding at this time.  12/31: Vitals normal.  Overnight patient complained about that she is seeing stars which is a sign of low magnesium so magnesium was checked and it was 1.3-6 g of magnesium ordered by night on-call provider.  Patient unable to complete bowel prep and stool remained dark brown so colonoscopy was postponed until tomorrow.  She will continue to get bowel prep through NG tube today.   Assessment and Plan: * GI bleeding Patient had some rectal bleed with history of chronic constipation where she was seen in [place] [facility] recently and was given an enema.  No prior diagnosis of hemorrhoid. GI was consulted and they were recommending colonoscopy, patient herself would like to proceed but mother was initially reluctant, now at agreed to proceed so she can go home tomorrow. No current active bleed at this time and hemoglobin at 11.0,  Last dose of Eliquis was on 12/28 in the morning Had unsuccessful colonoscopy attempt yesterday, concern of fecal impaction.  Unable to complete prep and stool remained dark brown, GI to retry with more colon prep tomorrow -Monitor hemoglobin -Keep holding Eliquis  Hypokalemia Potassium again decreased to 3.3, most likely secondary to GI loss -Replace potassium -Continue to monitor  Hypomagnesemia Resolved with repletion  IBS (irritable bowel syndrome) - This could be contributing to her constipation. - We will continue Trulance and  Linzess.  GERD without esophagitis - Continue with PPI, -Stop Carafate as it can contribute with constipation  Essential hypertension - We will continue Toprol-XL.   Subjective: Patient was seen and examined today.  Having dark brown-colored diarrhea still.  Half of the prep was at bedside.  Physical Exam: Vitals:   12/11/22 2111 12/12/22 0532 12/12/22 0757 12/12/22 1309  BP: 111/78 101/70 104/76 112/76  Pulse: 87 85 87 85   Resp: 16 16 16 18   Temp:  98 F (36.7 C) 98.5 F (36.9 C) 98 F (36.7 C)  TempSrc:  Oral Oral   SpO2: 100% 100% 100% 98%  Weight:      Height:       General.     In no acute distress. Pulmonary.  Lungs clear bilaterally, normal respiratory effort. CV.  Regular rate and rhythm, no JVD, rub or murmur. Abdomen.  Soft, nontender, nondistended, BS positive. CNS.  Alert and oriented .  Multiple contractures Extremities.  No edema, no cyanosis, pulses intact and symmetrical. Psychiatry.  Judgment and insight appears normal.   Data Reviewed: Prior data reviewed  Family Communication: Discussed with mother at bedside  Disposition: Status is: Inpatient   Planned Discharge Destination: Home  Time spent: 38 minutes  This record has been created using . Errors have been sought and corrected,but may not always be located. Such creation errors do not reflect on the standard of care.   Author: Conservation officer, historic buildings, MD 12/12/2022 3:42 PM  For on call review [email or web address].

## 2022-12-12 NOTE — Progress Notes (Signed)
Patient could not undergo colonoscopy today because her stools were still dark brown and liquid.  She did not finish her prep overnight.  Discussed with patient and she is willing to undergo more prep today for colonoscopy tomorrow Administer bowel prep via NG tube Insert rectal tube while patient undergoing bowel prep N.p.o. effective 5 AM Continue clear liquids today  Lannette Donath, MD

## 2022-12-12 NOTE — Progress Notes (Signed)
       CROSS COVER NOTE  NAME: [name] MRN: [id number] DOB : 21-Sep-1981    HPI/Events of Note   Nurse reported patient stated she sees stars and that is a sign that her magnesium is low.  Assessment and  Interventions   Assessment: Vss Mag level checked = 1.3 Plan: Mag sulfate total 6 gms ordered     Donnie Mesa NP [facility]

## 2022-12-13 ENCOUNTER — Inpatient Hospital Stay: Payer: Medicaid Other | Admitting: General Practice

## 2022-12-13 ENCOUNTER — Encounter: Payer: Self-pay | Admitting: Family Medicine

## 2022-12-13 ENCOUNTER — Encounter
Admission: EM | Disposition: A | Discharge status: Home / Self Care | Payer: Self-pay | Source: Home / Self Care | Attending: Internal Medicine

## 2022-12-13 DIAGNOSIS — K5900 Constipation, unspecified: Secondary | ICD-10-CM

## 2022-12-13 DIAGNOSIS — K640 First degree hemorrhoids: Secondary | ICD-10-CM

## 2022-12-13 DIAGNOSIS — E876 Hypokalemia: Secondary | ICD-10-CM

## 2022-12-13 DIAGNOSIS — K625 Hemorrhage of anus and rectum: Principal | ICD-10-CM

## 2022-12-13 DIAGNOSIS — K582 Mixed irritable bowel syndrome: Secondary | ICD-10-CM

## 2022-12-13 DIAGNOSIS — I10 Essential (primary) hypertension: Secondary | ICD-10-CM

## 2022-12-13 HISTORY — PX: COLONOSCOPY WITH PROPOFOL: SHX5780

## 2022-12-13 LAB — GLUCOSE, CAPILLARY
Glucose-Capillary: 104 mg/dL — ABNORMAL HIGH (ref 70–99)
Glucose-Capillary: 111 mg/dL — ABNORMAL HIGH (ref 70–99)
Glucose-Capillary: 115 mg/dL — ABNORMAL HIGH (ref 70–99)

## 2022-12-13 SURGERY — COLONOSCOPY WITH PROPOFOL
Anesthesia: General

## 2022-12-13 MED ORDER — PROPOFOL 1000 MG/100ML IV EMUL
INTRAVENOUS | Status: AC
  Filled 2022-12-13: qty 100

## 2022-12-13 MED ORDER — LIDOCAINE HCL (PF) 2 % IJ SOLN
INTRAMUSCULAR | Status: AC
  Filled 2022-12-13: qty 5

## 2022-12-13 MED ORDER — PROPOFOL 500 MG/50ML IV EMUL
INTRAVENOUS | Status: DC | PRN
  Administered 2022-12-13: 150 ug/kg/min via INTRAVENOUS

## 2022-12-13 MED ORDER — PROPOFOL 10 MG/ML IV BOLUS
INTRAVENOUS | Status: DC | PRN
  Administered 2022-12-13: 50 mg via INTRAVENOUS
  Administered 2022-12-13: 20 mg via INTRAVENOUS

## 2022-12-13 MED ORDER — SODIUM CHLORIDE 0.9 % IV SOLN: Freq: Once | INTRAVENOUS | Status: DC

## 2022-12-13 MED ORDER — HEPARIN SOD (PORK) LOCK FLUSH 100 UNIT/ML IV SOLN
500.0000 [IU] | Freq: Once | INTRAVENOUS | Status: DC
  Filled 2022-12-13: qty 5

## 2022-12-13 MED ORDER — DEXMEDETOMIDINE HCL IN NACL 200 MCG/50ML IV SOLN
INTRAVENOUS | Status: DC | PRN
  Administered 2022-12-13: 4 ug via INTRAVENOUS

## 2022-12-13 NOTE — Discharge Summary (Signed)
Physician Discharge Summary   Patient: [name] MRN: [id number] DOB: Nov 02, 1981  Admit date:     12/09/2022  Discharge date: 12/13/22  Discharge Physician: Lorella Nimrod   PCP: [facility]   Recommendations at discharge:  Please obtain CBC, BMP and magnesium levels during next follow-up visit Follow-up with primary care provider Follow-up with gastroenterology  Discharge Diagnoses: Principal Problem:   GI bleeding Active Problems:   Hypokalemia   Hypomagnesemia   IBS (irritable bowel syndrome)   Essential hypertension   GERD without esophagitis   Constipation   Grade I hemorrhoids   Hospital Course: Taken from H&P.  [name] is a 42 y.o. 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  She has been having intermittent bleeding since then and her mother has noticed blood clots.  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   D Course: When the patient came to the ER, heart rate was 102 with otherwise normal vital signs.  Labs revealed hypokalemia of 3.3 with magnesium of 1.4 with otherwise normal CMP.  CBC showed a hemoglobin of 12.7 hematocrit 38.5 and later 11.7/35.5 within 9 hours.  PT was 15.3 and INR 1.2.  Blood group was O+ with negative antibody screen.   EKG ,  Paced rhythm with a rate of 89 Imaging: Two-view chest x-ray showed no acute cardiopulmonary disease. GI was consulted and Eliquis was held.  12/29: Vitals stable and hemoglobin at 11.7.  Patient had some rectal bleed after getting soapsuds enema.  GI saw her and patient is interested in colonoscopy but mother initially declined, now seems agreeable.  She will need 2  days of Eliquis wash, last dose was on 12/09/2022 in the AM.  She will be on clear liquid today.  12/30: Vitals stable.  Magnesium and potassium has been normalized with magnesium of 2.1 and potassium of 4.  IV fluid with potassium supplement was discontinued.  Hemoglobin stable at 11.5 Patient had unsuccessful attempt of colonoscopy today at due to fecal impaction.  GI is planning to repeat tomorrow after more bowel prep.  She has NG tube in place as unable to drink much of that prep and is being given colon prep through NG tube.  No more active bleeding at this time.  12/31: Vitals normal.  Overnight patient complained about that she is seeing stars which is a sign of low magnesium so magnesium was checked and it was 1.3-6 g of magnesium ordered by night on-call provider.  Patient unable to complete bowel prep and stool remained dark brown so colonoscopy was postponed until tomorrow.  She will continue to get bowel prep through NG tube today.  12/13/22: Patient remained stable.  Had her colonoscopy which was essentially normal except grade 1 hemorrhoids.  Patient should avoid constipation.  No more active bleeding.  Patient will continue on her prior medications and need to have a close follow-up with her providers for further recommendations.  Assessment and Plan: * GI bleeding Patient had some rectal bleed with history of chronic constipation where she was seen in [place] recently and was given an enema.  No prior diagnosis of hemorrhoid. GI was consulted and they were recommending colonoscopy,  patient herself would like to proceed but mother was initially reluctant, now at agreed to proceed so she can go home tomorrow. No current active bleed at this time and hemoglobin at 11.0,  Last dose of Eliquis was on 12/28 in the morning Had unsuccessful colonoscopy attempt yesterday, concern of fecal impaction.  Unable to complete prep and stool remained dark brown, GI to retry with more colon prep  tomorrow -Monitor hemoglobin -Keep holding Eliquis  Hypokalemia Potassium again decreased to 3.3, most likely secondary to GI loss -Replace potassium -Continue to monitor  Hypomagnesemia Resolved with repletion  IBS (irritable bowel syndrome) - This could be contributing to her constipation. - We will continue Trulance and  Linzess.  GERD without esophagitis - Continue with PPI, -Stop Carafate as it can contribute with constipation  Essential hypertension - We will continue Toprol-XL.   Consultants: Gastroenterology Procedures performed: Colonoscopy Disposition: Home Diet recommendation:  Discharge Diet Orders (From admission, onward)     Start     Ordered   12/13/22 0000  Diet - low sodium heart healthy        12/13/22 1211           Cardiac diet DISCHARGE MEDICATION: Allergies as of 12/13/2022       Reactions   Duloxetine    Other reaction(s): Other (See Comments), Palpitations Other reaction(s): Hallucinations, Hyperactive behavior (finding), Other (See Comments) Stressed out   Flecainide    Other reaction(s): Other (See Comments) Other reaction(s): Hypomagnesemia (disorder), Other (See Comments) Other Reaction: QUESTION HYPOMAGNESEMIA   Fluoxetine    Other reaction(s): Nausea And Vomiting, Other (See Comments) Other reaction(s): Other (See Comments) Other Reaction: INCREASED HR  causes irritability and irrational behavior "I go crazy"   Gabapentin    Goes crazy Other reaction(s): Other (See Comments) Other reaction(s): Hyperactive behavior (finding), Mental Status Changes (intolerance), Other (See Comments) Goes crazy Hallucinations   Baclofen    Other reaction(s): Other (See Comments), Other (See Comments) Weakness  Other reaction(s): Other (See Comments) Weakness and loose Weakness  Other reaction(s):  Weakness    Cephalosporins    Other reaction(s): Hives, Rash Received pre-op vancomycin and cefazolin 07/20/16. Developed urticarial rash on  bilateral lower extremities + slight periorbital swelling. Treated with diphenhydramine. Received pre-op vancomycin and cefazolin 07/20/16. Developed urticarial rash on bilateral lower extremities + slight periorbital swelling. Treated with diphenhydramine. Received pre-op vancomycin and cefazolin 07/20/16. Developed urticarial rash on bilateral lower extremities + slight periorbital swelling. Treated with diphenhydramine.   Sulfasalazine    Other reaction(s): Nausea And Vomiting   Sulfa Antibiotics Nausea And Vomiting   Vancomycin    Other reaction(s): Hives Received pre-op vancomycin and cefazolin 07/20/16. Developed urticarial rash on bilateral lower extremities + slight periorbital swelling. Treated with diphenhydramine. Received pre-op vancomycin and cefazolin 07/20/16. Developed urticarial rash on bilateral lower extremities + slight periorbital swelling. Treated with diphenhydramine. Received pre-op vancomycin and cefazolin 07/20/16. Developed urticarial rash on bilateral lower extremities + slight periorbital swelling. Treated with diphenhydramine.   Cefazolin Rash   Tolerated ceftriaxone 2023 and cephalexin 2022        Medication List     STOP taking these medications    magnesium oxide 400 MG tablet Commonly known as: MAG-OX   sucralfate 1 g tablet Commonly known as: CARAFATE       TAKE these medications    Eliquis 5 MG Tabs tablet Generic drug: apixaban Take 5 mg by mouth 2 (two) times daily.   famotidine 40 MG tablet Commonly known  as: PEPCID Take 40 mg by mouth daily as needed for heartburn.   lactulose 10 g packet Commonly known as: CEPHULAC Take 1 packet (10 g total) by mouth 3 (three) times daily as needed (constipation).   Linzess 290 MCG Caps capsule Generic drug: linaclotide Take 290 mcg by mouth daily.   magnesium 84 MG (7MEQ) Tbcr SR tablet Commonly known as: MAGTAB Take 936 mg by mouth 3 (three) times daily.   methocarbamol 500 MG tablet Commonly  known as: ROBAXIN Take 500-1,000 mg by mouth every 6 (six) hours as needed (for back spasms).   metoCLOPramide 5 MG tablet Commonly known as: REGLAN Take 5 mg by mouth 4 (four) times daily.   metoprolol succinate 25 MG 24 hr tablet Commonly known as: TOPROL-XL Take 12.5 mg by mouth daily.   montelukast 10 MG tablet Commonly known as: SINGULAIR Take 10 mg by mouth daily as needed (allergies).   potassium chloride SA 20 MEQ tablet Commonly known as: KLOR-CON M Take 20 mEq by mouth 3 (three) times daily.   pregabalin 150 MG capsule Commonly known as: LYRICA Take 150 mg by mouth in the morning, at noon, and at bedtime.   Premarin vaginal cream Generic drug: conjugated estrogens Place 1 applicator vaginally daily.   Ritalin 20 MG tablet Generic drug: methylphenidate Take 20 mg by mouth 3 (three) times daily. Take 1 tablet (20 mg) TID at 0800, 1200 & 2100-2200   traMADol 50 MG tablet Commonly known as: ULTRAM Take 50 mg by mouth every 6 (six) hours as needed for moderate pain.   Trulance 3 MG Tabs Generic drug: Plecanatide Take 1 tablet by mouth daily.        Follow-up Information     [place], [facility]. Schedule an appointment as soon as possible for a visit in 1 week(s).   Contact information: [place] [address] [phone]                Discharge Exam: Danley Danker Weights   12/09/22 1523  Weight: 39 kg   General.     In no acute distress. Pulmonary.  Lungs clear bilaterally, normal respiratory effort. CV.  Regular rate and rhythm, no JVD, rub or murmur. Abdomen.  Soft, nontender, nondistended, BS positive. CNS.  Alert and oriented .  Multiple contractures Extremities.  No edema, no cyanosis, pulses intact and symmetrical. Psychiatry.  Judgment and insight appears normal.   Condition at discharge: stable  The results of significant diagnostics from this hospitalization (including imaging, microbiology, ancillary and laboratory)  are listed below for reference.   Imaging Studies: DG Abd 1 View  Result Date: 12/10/2022 CLINICAL DATA:  Nasogastric tube placement. EXAM: ABDOMEN - 1 VIEW COMPARISON:  June 18, 2017 FINDINGS: A nasogastric tube is seen with its distal tip overlying the expected region of the duodenal bulb. The bowel gas pattern is normal. No radio-opaque calculi or other significant radiographic abnormality are seen. IMPRESSION: Nasogastric tube positioning, as described above. Electronically Signed   By: Virgina Norfolk [name]   On: 12/10/2022 23:27   CT ANGIO GI BLEED  Result Date: 12/10/2022 CLINICAL DATA:  Lower GI bleed. EXAM: CTA ABDOMEN AND PELVIS WITHOUT AND WITH CONTRAST TECHNIQUE: Multidetector CT imaging of the abdomen and pelvis was performed using the standard protocol during bolus administration of intravenous contrast. Multiplanar reconstructed images and MIPs were obtained and reviewed to evaluate the vascular anatomy. RADIATION DOSE REDUCTION: This exam was performed according to the departmental dose-optimization program which  includes automated exposure control, adjustment of the mA and/or kV according to patient size and/or use of iterative reconstruction technique. CONTRAST:  33mL OMNIPAQUE IOHEXOL 350 MG/ML SOLN COMPARISON:  CT abdomen pelvis dated 12/04/2022. FINDINGS: Evaluation is limited due to streak artifact caused by patient's arm and metallic stimulator device in the anterior abdomen. VASCULAR Aorta: Normal caliber aorta without aneurysm, dissection, vasculitis or significant stenosis. Celiac: Patent without evidence of aneurysm, dissection, vasculitis or significant stenosis. SMA: Patent without evidence of aneurysm, dissection, vasculitis or significant stenosis. Renals: Both renal arteries are patent without evidence of aneurysm, dissection, vasculitis, fibromuscular dysplasia or significant stenosis. IMA: Patent without evidence of aneurysm, dissection, vasculitis or significant  stenosis. Inflow: Patent without evidence of aneurysm, dissection, vasculitis or significant stenosis. Proximal Outflow: Bilateral common femoral and visualized portions of the superficial and profunda femoral arteries are patent without evidence of aneurysm, dissection, vasculitis or significant stenosis. Veins: The IVC is unremarkable. The SMV, splenic vein, and main portal vein are patent. No portal venous gas. Review of the MIP images confirms the above findings. NON-VASCULAR Lower chest: The visualized lung bases are clear. 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 No intra-abdominal free air or free fluid. Hepatobiliary: The liver is unremarkable as visualized. Cholecystectomy. Mild biliary dilatation. No retained calcified stone noted in the central CBD. Pancreas: The pancreas is suboptimally evaluated due to paucity of intra-abdominal fat. The tail and body of the pancreas are not seen. The head and uncinate process of pancreas appear unremarkable. Spleen: Normal in size without focal abnormality. Adrenals/Urinary Tract: The adrenal glands are unremarkable. Bilateral renal calculi as seen on the prior CT measure up to 1.5 cm in the inferior pole of the right kidney. No hydronephrosis. Multiple subcentimeter bilateral renal hypodense lesions are not characterized. The urinary bladder is grossly unremarkable. Stomach/Bowel: Oral contrast noted within the stomach which limits evaluation for GI bleed. There is large amount of stool throughout the colon. No bowel obstruction. No evidence of active GI bleed. Lymphatic: No obvious adenopathy. Evaluation however is limited due to paucity of intra-abdominal fat and other limiting factors above. Reproductive: Hysterectomy. Other: None Musculoskeletal: No acute osseous pathology. Median sternotomy wires. IMPRESSION: 1. No acute intra-abdominal or pelvic pathology. No evidence of active GI bleed. 2. Constipation.  No bowel obstruction. 3. Bilateral renal calculi.  No hydronephrosis. Electronically Signed   By: Anner Crete [name]   On: 12/10/2022 01:47   CT ABDOMEN PELVIS WO CONTRAST  Result Date: 12/04/2022 CLINICAL DATA:  Abdominal pain, constipation EXAM: CT ABDOMEN AND PELVIS WITHOUT CONTRAST TECHNIQUE: Multidetector CT imaging of the abdomen and pelvis was performed following the standard protocol without IV contrast. Unenhanced CT was performed per clinician order. Lack of IV contrast limits sensitivity and specificity, especially for evaluation of abdominal/pelvic solid viscera. RADIATION DOSE REDUCTION: This exam was performed according to the departmental dose-optimization program which includes automated exposure control, adjustment of the mA and/or kV according to patient size and/or use of iterative reconstruction technique. COMPARISON:  10/24/2022 FINDINGS: Lower chest: Limited by respiratory motion. No acute pleural or parenchymal lung disease. Hepatobiliary: Cholecystectomy. Unremarkable unenhanced appearance of the liver. Pancreas: Unremarkable unenhanced appearance. Spleen: Unremarkable unenhanced appearance. Adrenals/Urinary Tract: Stable bilateral renal calculi, measuring up to 15 mm on the right and 8 mm on the left. No obstructive uropathy within either kidney. Bladder is unremarkable. The adrenals are stable. Stomach/Bowel: Evaluation of the bowel is limited due to streak artifact from indwelling pacemaker in the right upper anterior abdominal wall, lack of  oral and intravenous contrast, and the lack of intraperitoneal fat. There is no evidence of bowel obstruction. Moderate retained stool throughout the colon consistent with constipation. Normal gas-filled appendix right lower quadrant. Vascular/Lymphatic: Aortic atherosclerosis. No enlarged abdominal or pelvic lymph nodes. Reproductive: Status post hysterectomy. No adnexal masses. Other: No free fluid or free intraperitoneal gas. No abdominal wall hernia. Musculoskeletal: No acute or destructive  bony lesions. Reconstructed images demonstrate no additional findings. IMPRESSION: 1. Moderate fecal retention throughout the colon consistent with constipation. No bowel obstruction or ileus. 2. Stable bilateral nonobstructing renal calculi. 3.  Aortic Atherosclerosis (ICD10-I70.0). Electronically Signed   By: Randa Ngo [name]   On: 12/04/2022 18:23    Microbiology: Results for orders placed or performed during the hospital encounter of 10/24/22  Blood Culture (routine x 2)     Status: None   Collection Time: 10/24/22  4:15 AM   Specimen: BLOOD  Result Value Ref Range Status   Specimen Description BLOOD RIGHT ANTECUBITAL  Final   Special Requests   Final    BOTTLES DRAWN AEROBIC AND ANAEROBIC Blood Culture adequate volume   Culture   Final    NO GROWTH 5 DAYS Performed at [facility], [address], [place], [place] [address]    Report Status 10/29/2022 FINAL  Final  Blood Culture (routine x 2)     Status: None   Collection Time: 10/24/22  4:24 AM   Specimen: BLOOD LEFT HAND  Result Value Ref Range Status   Specimen Description BLOOD LEFT HAND  Final   Special Requests AEROBIC BOTTLE ONLY Blood Culture adequate volume  Final   Culture   Final    NO GROWTH 5 DAYS Performed at [facility], [place] [address], [place], [place] [address]    Report Status 10/29/2022 FINAL  Final  Urine Culture     Status: Abnormal   Collection Time: 10/24/22  9:45 AM   Specimen: In/Out Cath Urine  Result Value Ref Range Status   Specimen Description IN/OUT CATH URINE  Final   Special Requests   Final    NONE Performed at [facility], [place] [address] [place], [place] [address]    Culture MULTIPLE SPECIES PRESENT, SUGGEST RECOLLECTION (A)  Final   Report Status 10/25/2022 FINAL  Final  Resp Panel by RT-PCR (Flu A&B, Covid) Anterior Nasal Swab     Status: Abnormal   Collection Time: 10/24/22 10:42 AM   Specimen: Anterior Nasal Swab  Result Value Ref Range Status   SARS  Coronavirus 2 by RT PCR NEGATIVE NEGATIVE Final    Comment: (NOTE) SARS-CoV-2 target nucleic acids are NOT DETECTED.  The SARS-CoV-2 RNA is generally detectable in upper respiratory specimens during the acute phase of infection. The lowest concentration of SARS-CoV-2 viral copies this assay can detect is 138 copies/mL. A negative result does not preclude SARS-Cov-2 infection and should not be used as the sole basis for treatment or other patient management decisions. A negative result may occur with  improper specimen collection/handling, submission of specimen other than nasopharyngeal swab, presence of viral mutation(s) within the areas targeted by this assay, and inadequate number of viral copies(<138 copies/mL). A negative result must be combined with clinical observations, patient history, and epidemiological information. The expected result is Negative.  Fact Sheet for Patients:  EntrepreneurPulse.com.au  Fact Sheet for Healthcare Providers:  IncredibleEmployment.be  This test is no t yet approved or cleared by the [place] and  has been authorized  for detection and/or diagnosis of SARS-CoV-2 by FDA under an Emergency Use Authorization (EUA). This EUA will remain  in effect (meaning this test can be used) for the duration of the COVID-19 declaration under Section 564(b)(1) of the Act, 21 U.S.C.section 360bbb-3(b)(1), unless the authorization is terminated  or revoked sooner.       Influenza A by PCR POSITIVE (A) NEGATIVE Final   Influenza B by PCR NEGATIVE NEGATIVE Final    Comment: (NOTE) The Xpert Xpress SARS-CoV-2/FLU/RSV plus assay is intended as an aid in the diagnosis of influenza from Nasopharyngeal swab specimens and should not be used as a sole basis for treatment. Nasal washings and aspirates are unacceptable for Xpert Xpress SARS-CoV-2/FLU/RSV testing.  Fact Sheet for  Patients: EntrepreneurPulse.com.au  Fact Sheet for Healthcare Providers: IncredibleEmployment.be  This test is not yet approved or cleared by the [place] FDA and has been authorized for detection and/or diagnosis of SARS-CoV-2 by FDA under an Emergency Use Authorization (EUA). This EUA will remain in effect (meaning this test can be used) for the duration of the COVID-19 declaration under Section 564(b)(1) of the Act, 21 U.S.C. section 360bbb-3(b)(1), unless the authorization is terminated or revoked.  Performed at [facility], [place] [address], [place], [place] [address]     Labs: CBC: Recent Labs  Lab 12/09/22 1553 12/10/22 0044 12/11/22 0330 12/12/22 0900  WBC 7.4  --  7.1 5.9  HGB 12.7 11.7* 11.5* 11.0*  HCT 38.5 35.5* 33.9* 32.7*  MCV 84.6  --  83.9 82.4  PLT 159  --  146* 829*   Basic Metabolic Panel: Recent Labs  Lab 12/09/22 1553 12/10/22 0044 12/11/22 0330 12/11/22 2131 12/12/22 0900  NA 141  --  140  --  140  K 3.3*  --  4.0  --  3.3*  CL 106  --  106  --  103  CO2 23  --  27  --  29  GLUCOSE 119*  --  111*  --  97  BUN 16  --  13  --  9  CREATININE 0.94  --  0.82  --  0.67  CALCIUM 9.5  --  9.4  --  8.7*  MG  --  1.4* 2.1 1.3* 2.7*   Liver Function Tests: Recent Labs  Lab 12/09/22 1553  AST 27  ALT 22  ALKPHOS 82  BILITOT 0.4  PROT 7.5  ALBUMIN 4.5   CBG: Recent Labs  Lab 12/12/22 1736 12/12/22 2101 12/13/22 0022 12/13/22 0420 12/13/22 0851  GLUCAP 88 97 104* 111* 115*    Discharge time spent: greater than 30 minutes.  This record has been created using Systems analyst. Errors have been sought and corrected,but may not always be located. Such creation errors do not reflect on the standard of care.   Signed: Lorella Nimrod, MD [facility] 12/13/2022

## 2022-12-13 NOTE — Transfer of Care (Signed)
Immediate Anesthesia Transfer of Care Note  Patient: [name]  Procedure(s) Performed: Procedure(s): COLONOSCOPY WITH PROPOFOL (N/A)  Patient Location: PACU and Endoscopy Unit  Anesthesia Type:General  Level of Consciousness: sedated  Airway & Oxygen Therapy: Patient Spontanous Breathing and Patient connected to nasal cannula oxygen  Post-op Assessment: Report given to RN and Post -op Vital signs reviewed and stable  Post vital signs: Reviewed and stable  Last Vitals:  Vitals:   12/13/22 1014 12/13/22 1058  BP: 106/82 99/68  Pulse: 98 88  Resp: 16 20  Temp:  36.7 C  SpO2: 742% 595%    Complications: No apparent anesthesia complications

## 2022-12-13 NOTE — Op Note (Signed)
[facility] Gastroenterology Patient Name: [name] Procedure Date: 12/13/2022 9:49 AM MRN: [id number] Account #: [id number] Date of Birth: October 01, 1981 Admit Type: Inpatient Age: 42 Room: [facility] ENDO ROOM 4 Gender: Female Note Status: Finalized Instrument Name: Peds Colonoscope [id number] Procedure:             Colonoscopy Indications:           This is the patient's first colonoscopy, Rectal                         bleeding Providers:             Lin Landsman MD, MD Referring MD:          Langley Gauss Primary Medicines:             General Anesthesia Complications:         No immediate complications. Estimated blood loss: None. Procedure:             Pre-Anesthesia Assessment:                        - Prior to the procedure, a History and Physical was                         performed, and patient medications and allergies were                         reviewed. The patient is competent. The risks and                         benefits of the procedure and the sedation options and                         risks were discussed with the patient. All questions                         were answered and informed consent was obtained.                         Patient identification and proposed procedure were                         verified by the physician, the nurse, the                         anesthesiologist, the anesthetist and the technician                         in the pre-procedure area in the procedure room in the                         endoscopy suite. Mental Status Examination: alert and                         oriented. Airway Examination: normal oropharyngeal                         airway and neck mobility. Respiratory Examination:  clear to auscultation. CV Examination: normal.                         Prophylactic Antibiotics: The patient does not require                         prophylactic antibiotics. Prior Anticoagulants:  The                         patient has taken Eliquis (apixaban), last dose was 4                         days prior to procedure. After reviewing the risks and                         benefits, the patient was deemed in satisfactory                         condition to undergo the procedure. The anesthesia                         plan was to use general anesthesia. Immediately prior                         to administration of medications, the patient was                         re-assessed for adequacy to receive sedatives. The                         heart rate, respiratory rate, oxygen saturations,                         blood pressure, adequacy of pulmonary ventilation, and                         response to care were monitored throughout the                         procedure. The physical status of the patient was                         re-assessed after the procedure.                        After obtaining informed consent, the colonoscope was                         passed under direct vision. Throughout the procedure,                         the patient's blood pressure, pulse, and oxygen                         saturations were monitored continuously. The                         Colonoscope was introduced through the anus and  advanced to the the terminal ileum, with                         identification of the appendiceal orifice and IC                         valve. The colonoscopy was performed without                         difficulty. The patient tolerated the procedure well.                         The quality of the bowel preparation was fair. The                         terminal ileum, ileocecal valve, appendiceal orifice,                         and rectum were photographed. Findings:      The perianal and digital rectal examinations were normal. Pertinent       negatives include normal sphincter tone and no palpable rectal lesions.      The  terminal ileum appeared normal.      Normal mucosa was found in the entire colon.      Non-bleeding external internal hemorrhoids were found during       retroflexion. The hemorrhoids were small.      The exam was otherwise without abnormality. Impression:            - Preparation of the colon was fair.                        - The examined portion of the ileum was normal.                        - Normal mucosa in the entire examined colon.                        - Non-bleeding external internal hemorrhoids.                        - The examination was otherwise normal.                        - No specimens collected. Recommendation:        - Return patient to hospital ward for possible                         discharge same day.                        - Resume regular diet today.                        - Continue present medications.                        - Resume Eliquis (apixaban) today at prior dose. Refer                         to  managing physician for further adjustment of                         therapy. Procedure Code(s):     --- Professional ---                        [id number], Colonoscopy, flexible; diagnostic, including                         collection of specimen(s) by brushing or washing, when                         performed (separate procedure) Diagnosis Code(s):     --- Professional ---                        K64.4, Residual hemorrhoidal skin tags                        K64.8, Other hemorrhoids                        K62.5, Hemorrhage of anus and rectum CPT copyright 2022 [employer]. All rights reserved. The codes documented in this report are preliminary and upon coder review may  be revised to meet current compliance requirements. Dr. Ulyess Mort Lin Landsman MD, MD 12/13/2022 10:50:50 AM This report has been signed electronically. Number of Addenda: 0 Note Initiated On: 12/13/2022 9:49 AM Scope Withdrawal Time: 0 hours 8 minutes 37 seconds   Total Procedure Duration: 0 hours 15 minutes 18 seconds  Estimated Blood Loss:  Estimated blood loss: none.      [facility]

## 2022-12-13 NOTE — Anesthesia Postprocedure Evaluation (Signed)
Anesthesia Post Note  Patient: [name]  Procedure(s) Performed: COLONOSCOPY WITH PROPOFOL  Patient location during evaluation: PACU Anesthesia Type: General Level of consciousness: awake and alert Pain management: pain level controlled Vital Signs Assessment: post-procedure vital signs reviewed and stable Respiratory status: spontaneous breathing, nonlabored ventilation, respiratory function stable and patient connected to nasal cannula oxygen Cardiovascular status: blood pressure returned to baseline and stable Postop Assessment: no apparent nausea or vomiting Anesthetic complications: no   No notable events documented.   Last Vitals:  Vitals:   12/13/22 1109 12/13/22 1120  BP: 94/69 (!) 98/57  Pulse: 89   Resp: 20   Temp:  36.6 C  SpO2: 100%     Last Pain:  Vitals:   12/13/22 1120  TempSrc:   PainSc: 1                  Precious Haws Cordaryl Decelles
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
## 2022-12-13 NOTE — Anesthesia Procedure Notes (Signed)
Date/Time: 12/13/2022 10:34 AM  Performed by: Doreen Salvage, CRNAPre-anesthesia Checklist: Patient identified, Emergency Drugs available, Suction available and Patient being monitored Patient Re-evaluated:Patient Re-evaluated prior to induction Oxygen Delivery Method: Nasal cannula Induction Type: IV induction Dental Injury: Teeth and Oropharynx as per pre-operative assessment  Comments: Nasal cannula with etCO2 monitoring

## 2022-12-14 ENCOUNTER — Encounter: Payer: Self-pay | Admitting: Gastroenterology

## 2023-03-22 ENCOUNTER — Emergency Department: Payer: Medicaid Other

## 2023-03-22 ENCOUNTER — Emergency Department
Admission: EM | Admit: 2023-03-22 | Discharge: 2023-03-22 | Disposition: A | Discharge status: Home / Self Care | Payer: Medicaid Other | Attending: Emergency Medicine | Admitting: Emergency Medicine

## 2023-03-22 ENCOUNTER — Other Ambulatory Visit: Payer: Self-pay

## 2023-03-22 DIAGNOSIS — Y92410 Unspecified street and highway as the place of occurrence of the external cause: Secondary | ICD-10-CM | POA: Insufficient documentation

## 2023-03-22 DIAGNOSIS — M545 Low back pain, unspecified: Secondary | ICD-10-CM | POA: Insufficient documentation
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
## 2023-03-22 NOTE — Discharge Instructions (Addendum)
Please use ibuprofen (Motrin) up to 800 mg every 8 hours, naproxen (Naprosyn) up to 500 mg every 12 hours, and/or acetaminophen (Tylenol) up to 4 g/day for any continued pain.  Please do not use this medication regimen for longer than 7 days 
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
## 2023-04-30 ENCOUNTER — Emergency Department (HOSPITAL_COMMUNITY): Payer: Medicaid Other

## 2023-04-30 ENCOUNTER — Other Ambulatory Visit: Payer: Self-pay

## 2023-04-30 ENCOUNTER — Encounter (HOSPITAL_COMMUNITY): Payer: Self-pay

## 2023-04-30 ENCOUNTER — Emergency Department (HOSPITAL_COMMUNITY)
Admission: EM | Admit: 2023-04-30 | Discharge: 2023-04-30 | Disposition: A | Discharge status: Home / Self Care | Payer: Medicaid Other | Attending: Emergency Medicine | Admitting: Emergency Medicine

## 2023-04-30 DIAGNOSIS — Z7901 Long term (current) use of anticoagulants: Secondary | ICD-10-CM | POA: Diagnosis not present

## 2023-04-30 DIAGNOSIS — R1013 Epigastric pain: Secondary | ICD-10-CM | POA: Diagnosis not present

## 2023-04-30 DIAGNOSIS — D649 Anemia, unspecified: Secondary | ICD-10-CM | POA: Diagnosis not present

## 2023-04-30 DIAGNOSIS — E876 Hypokalemia: Secondary | ICD-10-CM | POA: Insufficient documentation

## 2023-04-30 DIAGNOSIS — R079 Chest pain, unspecified: Secondary | ICD-10-CM

## 2023-04-30 DIAGNOSIS — R072 Precordial pain: Secondary | ICD-10-CM | POA: Insufficient documentation

## 2023-04-30 DIAGNOSIS — D696 Thrombocytopenia, unspecified: Secondary | ICD-10-CM | POA: Insufficient documentation

## 2023-04-30 DIAGNOSIS — Z95810 Presence of automatic (implantable) cardiac defibrillator: Secondary | ICD-10-CM | POA: Insufficient documentation

## 2023-04-30 LAB — BASIC METABOLIC PANEL
Anion gap: 10 (ref 5–15)
BUN: 15 mg/dL (ref 6–20)
CO2: 27 mmol/L (ref 22–32)
Calcium: 9.5 mg/dL (ref 8.9–10.3)
Chloride: 101 mmol/L (ref 98–111)
Creatinine, Ser: 0.98 mg/dL (ref 0.44–1.00)
GFR, Estimated: >60 mL/min (ref >60)
Glucose, Bld: 108 mg/dL — ABNORMAL HIGH (ref 70–99)
Potassium: 3.4 mmol/L — ABNORMAL LOW (ref 3.5–5.1)
Sodium: 138 mmol/L (ref 135–145)

## 2023-04-30 LAB — TROPONIN I (HIGH SENSITIVITY)
Troponin I (High Sensitivity): 2 ng/L (ref <18)
Troponin I (High Sensitivity): 4 ng/L (ref <18)

## 2023-04-30 LAB — HEPATIC FUNCTION PANEL
ALT: 25 U/L (ref 0–44)
AST: 35 U/L (ref 15–41)
Albumin: 3.7 g/dL (ref 3.5–5.0)
Alkaline Phosphatase: 73 U/L (ref 38–126)
Bilirubin, Direct: 0.3 mg/dL — ABNORMAL HIGH (ref 0.0–0.2)
Indirect Bilirubin: 0.4 mg/dL (ref 0.3–0.9)
Total Bilirubin: 0.7 mg/dL (ref 0.3–1.2)
Total Protein: 6.3 g/dL — ABNORMAL LOW (ref 6.5–8.1)

## 2023-04-30 LAB — CBC
HCT: 36.4 % (ref 36.0–46.0)
Hemoglobin: 11.9 g/dL — ABNORMAL LOW (ref 12.0–15.0)
MCH: 26.3 pg (ref 26.0–34.0)
MCHC: 32.7 g/dL (ref 30.0–36.0)
MCV: 80.5 fL (ref 80.0–100.0)
Platelets: 142 10*3/uL — ABNORMAL LOW (ref 150–400)
RBC: 4.52 MIL/uL (ref 3.87–5.11)
RDW: 14.7 % (ref 11.5–15.5)
WBC: 5.4 10*3/uL (ref 4.0–10.5)
nRBC: 0 % (ref 0.0–0.2)

## 2023-04-30 LAB — LIPASE, BLOOD: Lipase: 26 U/L (ref 11–51)

## 2023-04-30 LAB — MAGNESIUM: Magnesium: 1.2 mg/dL — ABNORMAL LOW (ref 1.7–2.4)

## 2023-04-30 MED ORDER — ALUM & MAG HYDROXIDE-SIMETH 200-200-20 MG/5ML PO SUSP
15.0000 mL | Freq: Once | ORAL | Status: AC
  Administered 2023-04-30: 15 mL via ORAL
  Filled 2023-04-30: qty 30

## 2023-04-30 MED ORDER — MAGNESIUM OXIDE -MG SUPPLEMENT 400 (240 MG) MG PO TABS
400.0000 mg | ORAL_TABLET | Freq: Once | ORAL | Status: DC
  Filled 2023-04-30: qty 1

## 2023-04-30 MED ORDER — IOHEXOL 350 MG/ML SOLN
75.0000 mL | Freq: Once | INTRAVENOUS | Status: AC | PRN
  Administered 2023-04-30: 75 mL via INTRAVENOUS

## 2023-04-30 MED ORDER — POTASSIUM CHLORIDE CRYS ER 20 MEQ PO TBCR
40.0000 meq | EXTENDED_RELEASE_TABLET | Freq: Once | ORAL | Status: AC
  Administered 2023-04-30: 40 meq via ORAL
  Filled 2023-04-30: qty 2

## 2023-04-30 MED ORDER — FAMOTIDINE 20 MG PO TABS
20.0000 mg | ORAL_TABLET | Freq: Once | ORAL | Status: AC
  Administered 2023-04-30: 20 mg via ORAL
  Filled 2023-04-30: qty 1

## 2023-04-30 NOTE — ED Triage Notes (Addendum)
Pt arrived via [facility] from home. Pt is wheelchair bound. Pt c/o midsternal chest pain that radiates to upper back, RUQ, LUQ abdomen pain started at 0500. EMS gave ASA 324 mg. Per EMS, pt refused nitro. Pt c/o left knee pain and swellingx5d. Pt has 4+ swelling of left knee. Left knee is soft. Pt states she has not had full BM in 2 mos. Pt states she has only been going a little bit. Pt is A&Ox3 to self, place and situation

## 2023-04-30 NOTE — ED Notes (Signed)
Ptar called 

## 2023-04-30 NOTE — Discharge Instructions (Addendum)
Note the workup today was overall reassuring.  As discussed, continue to take at home Pepcid, PPI, Carafate as prescribed.  Recommend follow-up with gastroenterology for reassessment of your symptoms.  I suspect your symptoms most likely secondary to GERD or reflux.  Please do not hesitate to return to emergency department for worrisome signs and symptoms we discussed become apparent.

## 2023-04-30 NOTE — ED Provider Notes (Signed)
[place] [facility] AT [facility] Provider Note   CSN: [id number] Arrival date & time: 04/30/23  1521     History  Chief Complaint  Patient presents with   Back Pain   Chest Pain    [name] is a 42 y.o. female.   Back Pain Associated symptoms: chest pain   Chest Pain Associated symptoms: back pain     42 year old female presents emergency department with complaints of chest pain, abdominal pain as well as left knee pain.  Patient states that she noted lower sternal chest pain beginning this morning with some radiation to her back.  States that when she was eating earlier it made symptoms worse.  Also reports some upper abdominal pain that began about the same time.  Reports feelings of nausea with no emesis.  Describes pain as sharp/stabbing in nature.  Denies any relieving symptoms.  Denies fever, cough, congestion, urinary symptoms.  She is also complaining of decreased bowel movements states she has had minimal bowel movements in the past 2 months as well as left knee pain.  Patient has had known history of left-sided prepatellar bursitis and states that pain in left knee has been persistent without acute changes or known recent injury  Past medical history significant for SA nodal dysfunction with ICD placement, postural orthostatic tachycardic syndrome, reflex sympathetic dystrophy, cerebral palsy, and Gettleman syndrome, hypomagnesia/hypokalemia, IBS, chronic abdominal pain, chronic back pain  Home Medications Prior to Admission medications   Medication Sig Start Date End Date Taking? Authorizing Provider  ELIQUIS 5 MG TABS tablet Take 5 mg by mouth 2 (two) times daily. 08/12/22   [provider]  famotidine (PEPCID) 40 MG tablet Take 40 mg by mouth daily as needed for heartburn. 03/25/22   [provider]  lactulose (CEPHULAC) 10 g packet Take 1 packet (10 g total) by mouth 3 (three) times daily as needed (constipation). Patient  not taking: Reported on 12/10/2022 12/05/22   Tilden Fossa, MD  LINZESS 290 MCG CAPS capsule Take 290 mcg by mouth daily. 10/26/22   [provider]  magnesium (MAGTAB) 84 MG ( ) TBCR SR tablet Take 936 mg by mouth 3 (three) times daily. 11/30/22   [provider]  methocarbamol (ROBAXIN) 500 MG tablet Take 500-1,000 mg by mouth every 6 (six) hours as needed (for back spasms).    [provider]  methylphenidate (RITALIN) 20 MG tablet Take 20 mg by mouth 3 (three) times daily. Take 1 tablet (20 mg) TID at 0800, 1200 & 2100-2200    [provider]  metoCLOPramide (REGLAN) 5 MG tablet Take 5 mg by mouth 4 (four) times daily. 11/05/22   [provider]  metoprolol succinate (TOPROL-XL) 25 MG 24 hr tablet Take 12.5 mg by mouth daily. 01/26/22   [provider]  montelukast (SINGULAIR) 10 MG tablet Take 10 mg by mouth daily as needed (allergies). 10/26/22   [provider]  pregabalin (LYRICA) 150 MG capsule Take 150 mg by mouth in the morning, at noon, and at bedtime.    [provider]  traMADol (ULTRAM) 50 MG tablet Take 50 mg by mouth every 6 (six) hours as needed for moderate pain. 09/20/13   [provider]  TRULANCE 3 MG TABS Take 1 tablet by mouth daily. 11/16/22   [provider]      Allergies    Duloxetine, Flecainide, Fluoxetine, Gabapentin, Baclofen, Cephalosporins, Sulfasalazine, Sulfa antibiotics, Vancomycin, and Cefazolin    Review of Systems  Review of Systems  Cardiovascular:  Positive for chest pain.  Musculoskeletal:  Positive for back pain.  All other systems reviewed and are negative.   Physical Exam Updated Vital Signs BP 111/75 (BP Location: Right Arm)   Pulse 85   Temp 98.5 F (36.9 C) (Oral)   Resp 18   Ht 5\' 2"  (1.575 m)   Wt 39 kg   LMP 06/09/2012   SpO2 100%   BMI 15.73 kg/m  Physical Exam Vitals and nursing note reviewed.  Constitutional:      General: She is  not in acute distress.    Appearance: She is well-developed.  HENT:     Head: Normocephalic and atraumatic.  Eyes:     Conjunctiva/sclera: Conjunctivae normal.  Cardiovascular:     Rate and Rhythm: Normal rate and regular rhythm.     Heart sounds: No murmur heard. Pulmonary:     Effort: Pulmonary effort is normal. No respiratory distress.     Breath sounds: Normal breath sounds. No wheezing, rhonchi or rales.  Chest:     Chest wall: Tenderness present.  Abdominal:     Palpations: Abdomen is soft.     Tenderness: There is abdominal tenderness. There is no right CVA tenderness, left CVA tenderness or guarding.     Comments: Epigastric abdominal tenderness to palpation  Musculoskeletal:        General: No swelling.     Cervical back: Neck supple.     Right lower leg: No edema.     Left lower leg: No edema.  Skin:    General: Skin is warm and dry.     Capillary Refill: Capillary refill takes less than 2 seconds.  Neurological:     Mental Status: She is alert.  Psychiatric:        Mood and Affect: Mood normal.     ED Results / Procedures / Treatments   Labs (all labs ordered are listed, but only abnormal results are displayed) Labs Reviewed  BASIC METABOLIC PANEL - Abnormal; Notable for the following components:      Result Value   Potassium 3.4 (*)    Glucose, Bld 108 (*)    All other components within normal limits  CBC - Abnormal; Notable for the following components:   Hemoglobin 11.9 (*)    Platelets 142 (*)    All other components within normal limits  HEPATIC FUNCTION PANEL - Abnormal; Notable for the following components:   Total Protein 6.3 (*)    Bilirubin, Direct 0.3 (*)    All other components within normal limits  MAGNESIUM - Abnormal; Notable for the following components:   Magnesium 1.2 (*)    All other components within normal limits  LIPASE, BLOOD  URINALYSIS, W/ REFLEX TO CULTURE (INFECTION SUSPECTED)  TROPONIN I (HIGH SENSITIVITY)  TROPONIN I  (HIGH SENSITIVITY)    EKG None  Radiology CT ABDOMEN PELVIS W CONTRAST  Result Date: 04/30/2023 CLINICAL DATA:  Acute abdominal pain. Left lower extremity swelling. Wheelchair-bound patient with cerebral palsy. EXAM: CT ABDOMEN AND PELVIS WITH CONTRAST TECHNIQUE: Multidetector CT imaging of the abdomen and pelvis was performed using the standard protocol following bolus administration of intravenous contrast. RADIATION DOSE REDUCTION: This exam was performed according to the departmental dose-optimization program which includes automated exposure control, adjustment of the mA and/or kV according to patient size and/or use of iterative reconstruction technique. CONTRAST:  75mL OMNIPAQUE IOHEXOL 350 MG/ML SOLN COMPARISON:  12/10/2022 and 05/04/2017 FINDINGS: Lower Chest: No acute findings. Hepatobiliary:  No hepatic masses identified. Pancreas: No mass or inflammatory changes. 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 Spleen: Within normal limits in size and appearance. Adrenals/Urinary Tract: Tiny renal cysts noted bilaterally (no followup imaging is recommended). No suspicious masses identified. No evidence of ureteral calculi or hydronephrosis. Unremarkable unopacified urinary bladder. Stomach/Bowel: No evidence of obstruction, inflammatory process or abnormal fluid collections. Vascular/Lymphatic: No pathologically enlarged lymph nodes. No acute vascular findings. Reproductive: Prior hysterectomy noted. Adnexal regions are unremarkable in appearance. Other:  None. Musculoskeletal: No suspicious bone lesions identified. Pacemaker noted in the right abdominal wall subcutaneous tissues. IMPRESSION: No acute findings or other significant abnormality within the abdomen or pelvis. Electronically Signed   By: Danae Orleans [name]   On: 04/30/2023 18:58   CT Angio Chest PE W and/or Wo Contrast  Result Date: 04/30/2023 CLINICAL DATA:  Acute onset chest pain. Lower extremity  swelling. Wheelchair-bound. High probability for pulmonary embolism. EXAM: CT ANGIOGRAPHY CHEST WITH CONTRAST TECHNIQUE: Multidetector CT imaging of the chest was performed using the standard protocol during bolus administration of intravenous contrast. Multiplanar CT image reconstructions and MIPs were obtained to evaluate the vascular anatomy. RADIATION DOSE REDUCTION: This exam was performed according to the departmental dose-optimization program which includes automated exposure control, adjustment of the mA and/or kV according to patient size and/or use of iterative reconstruction technique. CONTRAST:  75mL OMNIPAQUE IOHEXOL 350 MG/ML SOLN COMPARISON:  07/10/2010 FINDINGS: Cardiovascular: Image degradation by motion artifact noted. Satisfactory opacification of pulmonary arteries noted, and no pulmonary emboli identified. No evidence of thoracic aortic dissection or aneurysm. Mediastinum/Nodes: No masses or pathologically enlarged lymph nodes identified. Lungs/Pleura: Mild pleural-parenchymal scarring seen in the lateral left upper lobe. No pulmonary mass, infiltrate, or effusion. Upper abdomen: No acute findings. Musculoskeletal: No suspicious bone lesions identified. Review of the MIP images confirms the above findings. IMPRESSION: Mildly degraded exam by respiratory motion artifact. No evidence of pulmonary embolism or other active disease within the thorax. Electronically Signed   By: Danae Orleans [name]   On: 04/30/2023 18:51   DG Chest 2 View  Result Date: 04/30/2023 CLINICAL DATA:  Chest pain EXAM: CHEST - 2 VIEW COMPARISON:  10/24/2022 FINDINGS: Right Port-A-Cath tip at mid right atrium. Left-sided single lead pacer. Inferior cardiac pacing device unchanged. Patient rotated right. Midline trachea. Normal heart size. No pleural effusion or pneumothorax. Clear lungs. IMPRESSION: No acute cardiopulmonary disease. Electronically Signed   By: Jeronimo Greaves [name]   On: 04/30/2023 16:27   DG Knee Complete 4  Views Left  Result Date: 04/30/2023 CLINICAL DATA:  Atraumatic left knee swelling EXAM: LEFT KNEE - COMPLETE 4+ VIEW COMPARISON:  None Available. FINDINGS: Frontal, bilateral oblique, and lateral views of the left knee are obtained. No fracture, subluxation, or dislocation. There is mild 3 compartmental osteoarthritis, with chondrocalcinosis in the medial and lateral compartments. No joint effusion. Marked prepatellar soft tissue swelling could reflect underlying prepatellar bursitis. IMPRESSION: 1. Marked prepatellar soft tissue swelling, most consistent with prepatellar bursitis. 2. Mild 3 compartmental osteoarthritis. 3. No acute bony abnormality. Electronically Signed   By: Sharlet Salina [name]   On: 04/30/2023 16:26    Procedures Procedures    Medications Ordered in ED Medications  magnesium oxide (MAG-OX) tablet 400 mg (400 mg Oral Not Given 04/30/23 1938)  famotidine (PEPCID) tablet 20 mg (20 mg Oral Given 04/30/23 1736)  alum & mag hydroxide-simeth (MAALOX/MYLANTA) 200-200-20 MG/5ML suspension 15 mL (15 mLs Oral Given 04/30/23 1744)  potassium chloride SA (  KLOR-CON M) CR tablet 40 mEq (40 mEq Oral Given 04/30/23 1735)  iohexol (OMNIPAQUE) 350 MG/ML injection 75 mL (75 mLs Intravenous Contrast Given 04/30/23 1840)    ED Course/ Medical Decision Making/ A&P                             Medical Decision Making Amount and/or Complexity of Data Reviewed Labs: ordered. Radiology: ordered.  Risk OTC drugs. Prescription drug management.   This patient presents to the ED for concern of chest pain, abdominal pain, this involves an extensive number of treatment options, and is a complaint that carries with it a high risk of complications and morbidity.  The differential diagnosis includes GERD, ACS, PE, pneumothorax,, aortic dissection, aortic aneurysm, gastritis, PUD, CBD pathology, cholecystitis, hepatitis, SBO/LBO, nephrolithiasis, pancreatitis   Co morbidities that complicate the patient  evaluation  See HPI   Additional history obtained:  Additional history obtained from EMR External records from outside source obtained and reviewed including hospital records   Lab Tests:  I Ordered, and personally interpreted labs.  The pertinent results include: No leukocytosis noted.  Mild evidence of anemia with hemoglobin 11.90 which is near patient's baseline.  Thrombocytopenia of 142 of which is also near patient's baseline.  Mild hypokalemia of 3.4 supplemented orally while emergency department.  No other electrolyte abnormalities.  No renal dysfunction.  No transaminitis.  No evidence of hypomagnesia with 1.20 which seems to be patient's baseline or near it supplemented orally while emergency department.  Lipase within normal limits.  Initial troponin of 2 with repeat for Imaging Studies ordered:  I ordered imaging studies including chest x-ray, left knee x-ray, CT angio chest, CT abdomen pelvis I independently visualized and interpreted imaging which showed  Chest x-ray: No acute cardiopulmonary abnormality Left knee x-ray: Marked prepatellar soft tissue swelling consistent with prepatellar bursitis.  Mild 3 compartmental osteoarthritis.  No acute bony abnormality. CT angio chest PE: No evidence of cardiopulmonary abnormality or PE CT abdomen pelvis: No acute abnormalities I agree with the radiologist interpretation  Cardiac Monitoring: / EKG:  The patient was maintained on a cardiac monitor.  I personally viewed and interpreted the cardiac monitored which showed an underlying rhythm of: Sinus rhythm with short PR   Consultations Obtained:  N/a   Problem List / ED Course / Critical interventions / Medication management  Chest pain, epigastric abdominal pain I ordered medication including potassium chloride, magnesium oxide, Pepcid, Mylanta   Reevaluation of the patient after these medicines showed that the patient improved I have reviewed the patients home medicines  and have made adjustments as needed   Social Determinants of Health:  Denies tobacco, illicit drug use   Test / Admission - Considered:  Chest pain, epigastric abdominal pain Vitals signs within normal range and stable throughout visit. Laboratory/imaging studies significant for: See above 42 year old female presents emergency department with chest pain and epigastric abdominal pain.  Workup today overall reassuring.  Patient with delta negative troponin without obvious acute ischemic changes on EKG.  Heart score of 03 so doubt ACS.  PE study was obtained for further assessment of patient's complaints of which was negative for PE or observable aortic dissection.  CT abdomen pelvis was obtained which is negative for any acute intra-abdominal pathology.  Patient's symptoms seem to improve significantly with administration of GI cocktail.  Symptoms could be secondary to GERD given marked improvement with GI cocktail as well as invoking/worsening of pain with  consumption of food earlier today.  Patient already on PPI, H2 blocker as well as Carafate 4 times daily.  Seems to be similar to presentation from other ER visits upon chart review.  Will recommend close follow-up with gastroenterology outpatient for further assessment of possible intervention via endoscopy.  Treatment plan discussed at length with patient and she acknowledged understanding was agreeable to said plan.  Patient overall well-appearing, afebrile in no acute distress. Worrisome signs and symptoms were discussed with the patient, and the patient acknowledged understanding to return to the ED if noticed. Patient was stable upon discharge.          Final Clinical Impression(s) / ED Diagnoses Final diagnoses:  Chest pain, unspecified type    Rx / DC Orders ED Discharge Orders     None         Peter Garter, [place] 04/30/23 2117    Wynetta Fines, MD 04/30/23 2212

## 2023-05-28 ENCOUNTER — Emergency Department: Payer: Medicare Other

## 2023-05-28 ENCOUNTER — Other Ambulatory Visit: Payer: Self-pay

## 2023-05-28 ENCOUNTER — Emergency Department
Admission: EM | Admit: 2023-05-28 | Discharge: 2023-05-28 | Disposition: A | Discharge status: Home / Self Care | Payer: Medicare Other | Attending: Emergency Medicine | Admitting: Emergency Medicine

## 2023-05-28 DIAGNOSIS — E876 Hypokalemia: Secondary | ICD-10-CM | POA: Insufficient documentation

## 2023-05-28 DIAGNOSIS — K5900 Constipation, unspecified: Secondary | ICD-10-CM | POA: Diagnosis not present

## 2023-05-28 DIAGNOSIS — R109 Unspecified abdominal pain: Secondary | ICD-10-CM | POA: Diagnosis present

## 2023-05-28 LAB — COMPREHENSIVE METABOLIC PANEL
ALT: 22 U/L (ref 0–44)
AST: 22 U/L (ref 15–41)
Albumin: 4.5 g/dL (ref 3.5–5.0)
Alkaline Phosphatase: 80 U/L (ref 38–126)
Anion gap: 12 (ref 5–15)
BUN: 20 mg/dL (ref 6–20)
CO2: 27 mmol/L (ref 22–32)
Calcium: 9.6 mg/dL (ref 8.9–10.3)
Chloride: 100 mmol/L (ref 98–111)
Creatinine, Ser: 0.84 mg/dL (ref 0.44–1.00)
GFR, Estimated: >60 mL/min (ref >60)
Glucose, Bld: 126 mg/dL — ABNORMAL HIGH (ref 70–99)
Potassium: 3.1 mmol/L — ABNORMAL LOW (ref 3.5–5.1)
Sodium: 139 mmol/L (ref 135–145)
Total Bilirubin: 0.8 mg/dL (ref 0.3–1.2)
Total Protein: 7 g/dL (ref 6.5–8.1)

## 2023-05-28 LAB — CBC
HCT: 37.8 % (ref 36.0–46.0)
Hemoglobin: 12.4 g/dL (ref 12.0–15.0)
MCH: 26.5 pg (ref 26.0–34.0)
MCHC: 32.8 g/dL (ref 30.0–36.0)
MCV: 80.8 fL (ref 80.0–100.0)
Platelets: 143 10*3/uL — ABNORMAL LOW (ref 150–400)
RBC: 4.68 MIL/uL (ref 3.87–5.11)
RDW: 14.9 % (ref 11.5–15.5)
WBC: 6.3 10*3/uL (ref 4.0–10.5)
nRBC: 0 % (ref 0.0–0.2)

## 2023-05-28 LAB — MAGNESIUM: Magnesium: 1.2 mg/dL — ABNORMAL LOW (ref 1.7–2.4)

## 2023-05-28 LAB — LIPASE, BLOOD: Lipase: 31 U/L (ref 11–51)

## 2023-05-28 MED ORDER — MAGNESIUM SULFATE 2 GM/50ML IV SOLN
2.0000 g | Freq: Once | INTRAVENOUS | Status: AC
  Administered 2023-05-28: 2 g via INTRAVENOUS
  Filled 2023-05-28: qty 50

## 2023-05-28 MED ORDER — ONDANSETRON HCL 4 MG/2ML IJ SOLN
4.0000 mg | Freq: Once | INTRAMUSCULAR | Status: DC
  Filled 2023-05-28: qty 2

## 2023-05-28 MED ORDER — ACETAMINOPHEN 500 MG PO TABS: 1000.0000 mg | ORAL_TABLET | Freq: Once | ORAL | Status: DC

## 2023-05-28 MED ORDER — METOCLOPRAMIDE HCL 5 MG/ML IJ SOLN
10.0000 mg | Freq: Once | INTRAMUSCULAR | Status: DC
  Filled 2023-05-28: qty 2

## 2023-05-28 MED ORDER — POTASSIUM CHLORIDE CRYS ER 20 MEQ PO TBCR
40.0000 meq | EXTENDED_RELEASE_TABLET | Freq: Once | ORAL | Status: DC
  Filled 2023-05-28: qty 2

## 2023-05-28 MED ORDER — MAGNESIUM CITRATE PO SOLN
1.0000 | Freq: Once | ORAL | Status: AC
  Administered 2023-05-28: 1 via ORAL
  Filled 2023-05-28: qty 296

## 2023-05-28 MED ORDER — SORBITOL 70 % SOLN
400.0000 mL | TOPICAL_OIL | Freq: Once | ORAL | Status: DC
  Filled 2023-05-28: qty 120

## 2023-05-28 NOTE — ED Triage Notes (Addendum)
Pt to ed from home via [facility] for constipation x 1 week. Pt advised she gets chronic constipation and requires a smog enema. Pt is CP patient. Pt is CAOx4, in no acute distress in triage. Pt lives at home with her mom, who cannot drive. Pt was seen for same at her PCP this week and they didn't provide any treatment per the pt.

## 2023-05-28 NOTE — ED Notes (Signed)
Pt complains of nausea. Refused zofran and reglan.

## 2023-05-28 NOTE — ED Notes (Signed)
Patients Mom provided an update. Callback number [phone].

## 2023-05-28 NOTE — ED Notes (Signed)
SMOG enema not here yet from pharmacy.

## 2023-05-28 NOTE — ED Notes (Signed)
Pt in bed, tearful. States she has a headache. This RN offered pt tylenol, but she declined.

## 2023-05-28 NOTE — ED Provider Notes (Signed)
Care assumed of patient from outgoing provider.  See their note for initial history, exam and plan.  Clinical Course as of 05/28/23 0919  Sat May 28, 2023  0443 DG Abdomen 1 View [CF]  [phone] Chronic low mag - given mag and enema.  Refusing potassium. Plan to dc after enema.  [SM]  [id number] Transferring ED care to Dr. Arnoldo Morale.  I anticipate the patient will be appropriate for discharge after enema. [CF]    Clinical Course User Index [CF] Loleta Rose, MD [SM] Corena Herter, MD   Patient states she no longer wants to wait for the enema.  Called multiple times to pharmacy but states that it takes a lot of time to prepare and may be within 45 minutes.  Patient states that she does not want to wait for this and to call the ambulance and that she can do an enema as an outpatient.  Discharged with outpatient follow-up information.   Corena Herter, MD 05/28/23 1032

## 2023-05-28 NOTE — ED Notes (Signed)
Pt stating she wants to go home without SMOG enema because it has taken too long. Called pharmacy, they will send as soon as possible hopefully within 45 min. Pt also complaining of HA. Dr Arnoldo Morale informed.

## 2023-05-28 NOTE — ED Notes (Signed)
This RN unable to get blood at this time.

## 2023-05-28 NOTE — Discharge Instructions (Signed)
You were seen in the emergency department today for constipation.  We recommend that you use one or more of the following over-the-counter medications in the order described:   1)  Miralax (powder):  This medication works by drawing additional fluid into your intestines and helps to flush out your stool.  Mix the powder with water or juice according to label instructions.  Be sure to use the recommended amount of water or juice when you mix up the powder.  Plenty of fluids will help to prevent constipation. 2)  Colace (or Dulcolax) 100 mg:  This is a stool softener, and you may take it once or twice a day as needed. 3)  Senna tablets:  This is a bowel stimulant that will help "push" out your stool. It is the next step to add after you have tried a stool softener.  You may also want to consider using glycerin suppositories, which you insert into your rectum.  You hold it in place and is dissolves and softens your stool and stimulates your bowels.  You could also consider using an enema, which is also available over the counter.  Remember that narcotic pain medications are constipating, so avoid them or minimize their use.  Drink plenty of fluids.  Please return to the Emergency Department immediately if you develop new or worsening symptoms that concern you, such as (but not limited to) fever > 101 degrees, severe abdominal pain, or persistent vomiting.  

## 2023-05-28 NOTE — ED Provider Notes (Signed)
[facility] Provider Note    Event Date/Time   First MD Initiated Contact with Patient 05/28/23 0422     (approximate)   History   Abdominal Pain and Constipation (X 1 week)  Due to the patient's cerebral palsy, history is somewhat limited due to some chronic difficulties with speech.  HPI [name] is a 42 y.o. female who is well-known to the emergency department due to her history of chronic severe hypomagnesemia, chronic hypokalemia, cerebral palsy, etc.  She presents tonight by EMS for evaluation of constipation x 1 week.  She confirms that she suffers from chronic constipation and thinks that it gets worse when her magnesium level is low or though she acknowledges that it is always low.  She reports that she lives at home with her mom who cannot drive.  The patient went to her PCP earlier this week but she reports that they did not provide any treatment.  She said that a smog enema is the only thing that will help her.     Physical Exam   Triage Vital Signs: ED Triage Vitals  Enc Vitals Group     BP 05/28/23 0241 (!) 136/90     Pulse Rate 05/28/23 0241 90     Resp 05/28/23 0241 16     Temp 05/28/23 0241 98 F (36.7 C)     Temp Source 05/28/23 0241 Oral     SpO2 05/28/23 0241 100 %     Weight 05/28/23 0239 39 kg (85 lb 15.7 oz)     Height 05/28/23 0239 1.575 m (5\' 2" )     Head Circumference --      Peak Flow --      Pain Score 05/28/23 0239 8     Pain Loc --      Pain Edu? --      Excl. in GC? --     Most recent vital signs: Vitals:   05/28/23 0241  BP: (!) 136/90  Pulse: 90  Resp: 16  Temp: 98 F (36.7 C)  SpO2: 100%    General: Awake, alert, seems to be roughly at her baseline. CV:  Good peripheral perfusion.  Regular rate and rhythm. Resp:  Normal effort. Speaking easily and comfortably, no accessory muscle usage nor intercostal retractions.   Abd:  No distention.  No tenderness to palpation of the abdomen. Other:  Chronic  contractures and limitations with movement and speech due to her cerebral palsy, but no evidence of acute or emergent physical exam changes.   ED Results / Procedures / Treatments   Labs (all labs ordered are listed, but only abnormal results are displayed) Labs Reviewed  COMPREHENSIVE METABOLIC PANEL - Abnormal; Notable for the following components:      Result Value   Potassium 3.1 (*)    Glucose, Bld 126 (*)    All other components within normal limits  CBC - Abnormal; Notable for the following components:   Platelets 143 (*)    All other components within normal limits  MAGNESIUM - Abnormal; Notable for the following components:   Magnesium 1.2 (*)    All other components within normal limits  LIPASE, BLOOD     RADIOLOGY I viewed and interpreted the patient's 1 view abdominal x-ray.  No evidence of obstruction.  Radiologist mentions changes consistent with chronic constipation but no acute abnormalities.   PROCEDURES:  Critical Care performed: No  Procedures    IMPRESSION / MDM / ASSESSMENT AND PLAN /  ED COURSE  I reviewed the triage vital signs and the nursing notes.                              Differential diagnosis includes, but is not limited to, acute on chronic constipation, SBO/ileus, electrolyte or metabolic abnormality.  Patient's presentation is most consistent with acute presentation with potential threat to life or bodily function.  Labs/studies ordered: 1 view abdominal x-ray, CBC, lipase, magnesium, CMP  Interventions/Medications given:  Medications  sorbitol, milk of mag, mineral oil, glycerin (SMOG) enema (has no administration in time range)  magnesium sulfate IVPB 2 g 50 mL (0 g Intravenous Stopped 05/28/23 1610)  magnesium citrate solution 1 Bottle (1 Bottle Oral Given 05/28/23 0612)    (Note:  hospital course my include additional interventions and/or labs/studies not listed above.)   Patient has chronically low magnesium and I  anticipate will be low again.  Her potassium level is also low, and both of these electrolyte abnormalities can lead to constipation which likely explains her chronic constipation.  However the patient is refusing to try to take any oral potassium because she says that it makes her gag.  I gave her magnesium 2 g IV and she agreed with me to try taking magnesium citrate to start the process of bowel movements and she wants a SMOG enema so I have ordered 1.  Her workup is otherwise reassuring and I believe that the enema and the magnesium citrate will help her have a bowel movement.  There is no evidence that she has a bowel obstruction or would benefit from the CT scan.  The patient agrees with the current plan.     Clinical Course as of 05/28/23 0745  Sat May 28, 2023  0443 DG Abdomen 1 View [CF]  [phone] Chronic low mag - given mag and enema.  Refusing potassium. Plan to dc after enema.  [SM]  [id number] Transferring ED care to Dr. Arnoldo Morale.  I anticipate the patient will be appropriate for discharge after enema. [CF]    Clinical Course User Index [CF] Loleta Rose, MD [SM] Corena Herter, MD     FINAL CLINICAL IMPRESSION(S) / ED DIAGNOSES   Final diagnoses:  Constipation, unspecified constipation type  Hypomagnesemia  Hypokalemia     Rx / DC Orders   ED Discharge Orders     None        Note:  This document was prepared using Dragon voice recognition software and may include unintentional dictation errors.   Loleta Rose, MD 05/28/23 [phone]

## 2023-05-28 NOTE — ED Notes (Signed)
This RN spoke with patient's mother regarding patient's care. Patient's mother was very upset that pt had been here 9 hours and had not been offered anything to eat.She was also concerned that her daughter had not gotten any of her home medications. This RN notified mother that pt was being seen for abdominal pain, and as such food would not typically be offered. Mother was also informed that due to this being an ED, home medications are not typically provided unless a patient is here for an extended period of time. Patients are normally provided medications to treat their presenting complaints. Patient's mother was notified that patient was offered both zofran and reglan to treat her nausea, as well as tylenol for a headache - all of which she declined. Patient's mother then became upset, stating she wanted her daughter to be brought home. Mother was notified that transport had been arranged, but that I unfortunately had no way of knowing how long it would be before the transport company would be able to actually pick her up and bring her home. Patient's mother continued to request a timeframe and I continued to explain that the hospital has no control over the transport company; that we are 2 separate companies.

## 2023-05-29 ENCOUNTER — Emergency Department (HOSPITAL_COMMUNITY): Payer: Medicare Other

## 2023-05-29 ENCOUNTER — Other Ambulatory Visit: Payer: Self-pay

## 2023-05-29 ENCOUNTER — Emergency Department (HOSPITAL_BASED_OUTPATIENT_CLINIC_OR_DEPARTMENT_OTHER): Payer: Medicare Other

## 2023-05-29 ENCOUNTER — Encounter (HOSPITAL_COMMUNITY): Payer: Self-pay

## 2023-05-29 ENCOUNTER — Emergency Department (HOSPITAL_COMMUNITY)
Admission: EM | Admit: 2023-05-29 | Discharge: 2023-05-29 | Disposition: A | Discharge status: Home / Self Care | Payer: Medicare Other | Attending: Emergency Medicine | Admitting: Emergency Medicine

## 2023-05-29 ENCOUNTER — Emergency Department (HOSPITAL_COMMUNITY)
Admission: EM | Admit: 2023-05-29 | Discharge: 2023-05-29 | Disposition: A | Discharge status: Home / Self Care | Payer: Medicare Other | Source: Home / Self Care | Attending: Emergency Medicine | Admitting: Emergency Medicine

## 2023-05-29 DIAGNOSIS — R531 Weakness: Secondary | ICD-10-CM

## 2023-05-29 DIAGNOSIS — K219 Gastro-esophageal reflux disease without esophagitis: Secondary | ICD-10-CM | POA: Insufficient documentation

## 2023-05-29 DIAGNOSIS — Z8669 Personal history of other diseases of the nervous system and sense organs: Secondary | ICD-10-CM | POA: Insufficient documentation

## 2023-05-29 DIAGNOSIS — R0789 Other chest pain: Secondary | ICD-10-CM | POA: Insufficient documentation

## 2023-05-29 DIAGNOSIS — E860 Dehydration: Secondary | ICD-10-CM | POA: Insufficient documentation

## 2023-05-29 DIAGNOSIS — G809 Cerebral palsy, unspecified: Secondary | ICD-10-CM | POA: Insufficient documentation

## 2023-05-29 DIAGNOSIS — G90A Postural orthostatic tachycardia syndrome (POTS): Secondary | ICD-10-CM | POA: Insufficient documentation

## 2023-05-29 DIAGNOSIS — E876 Hypokalemia: Secondary | ICD-10-CM | POA: Insufficient documentation

## 2023-05-29 DIAGNOSIS — R079 Chest pain, unspecified: Secondary | ICD-10-CM | POA: Diagnosis present

## 2023-05-29 DIAGNOSIS — Z7901 Long term (current) use of anticoagulants: Secondary | ICD-10-CM | POA: Insufficient documentation

## 2023-05-29 DIAGNOSIS — Z950 Presence of cardiac pacemaker: Secondary | ICD-10-CM | POA: Diagnosis not present

## 2023-05-29 DIAGNOSIS — R52 Pain, unspecified: Secondary | ICD-10-CM

## 2023-05-29 LAB — COMPREHENSIVE METABOLIC PANEL
ALT: 21 U/L (ref 0–44)
AST: 21 U/L (ref 15–41)
Albumin: 4 g/dL (ref 3.5–5.0)
Alkaline Phosphatase: 77 U/L (ref 38–126)
Anion gap: 13 (ref 5–15)
BUN: 18 mg/dL (ref 6–20)
CO2: 26 mmol/L (ref 22–32)
Calcium: 9.7 mg/dL (ref 8.9–10.3)
Chloride: 99 mmol/L (ref 98–111)
Creatinine, Ser: 0.85 mg/dL (ref 0.44–1.00)
GFR, Estimated: >60 mL/min (ref >60)
Glucose, Bld: 112 mg/dL — ABNORMAL HIGH (ref 70–99)
Potassium: 3 mmol/L — ABNORMAL LOW (ref 3.5–5.1)
Sodium: 138 mmol/L (ref 135–145)
Total Bilirubin: 0.7 mg/dL (ref 0.3–1.2)
Total Protein: 6.6 g/dL (ref 6.5–8.1)

## 2023-05-29 LAB — URINALYSIS, ROUTINE W REFLEX MICROSCOPIC
Bacteria, UA: NONE SEEN
Bilirubin Urine: NEGATIVE
Glucose, UA: NEGATIVE mg/dL
Hgb urine dipstick: NEGATIVE
Ketones, ur: 5 mg/dL — AB
Nitrite: NEGATIVE
Protein, ur: NEGATIVE mg/dL
Specific Gravity, Urine: 1.017 (ref 1.005–1.030)
pH: 7 (ref 5.0–8.0)

## 2023-05-29 LAB — CBC
HCT: 36.3 % (ref 36.0–46.0)
Hemoglobin: 11.9 g/dL — ABNORMAL LOW (ref 12.0–15.0)
MCH: 26.4 pg (ref 26.0–34.0)
MCHC: 32.8 g/dL (ref 30.0–36.0)
MCV: 80.5 fL (ref 80.0–100.0)
Platelets: 160 10*3/uL (ref 150–400)
RBC: 4.51 MIL/uL (ref 3.87–5.11)
RDW: 15 % (ref 11.5–15.5)
WBC: 4.8 10*3/uL (ref 4.0–10.5)
nRBC: 0 % (ref 0.0–0.2)

## 2023-05-29 LAB — TROPONIN I (HIGH SENSITIVITY): Troponin I (High Sensitivity): 4 ng/L (ref <18)

## 2023-05-29 LAB — MAGNESIUM: Magnesium: 1.8 mg/dL (ref 1.7–2.4)

## 2023-05-29 LAB — LIPASE, BLOOD: Lipase: 26 U/L (ref 11–51)

## 2023-05-29 MED ORDER — POTASSIUM CHLORIDE CRYS ER 20 MEQ PO TBCR
40.0000 meq | EXTENDED_RELEASE_TABLET | Freq: Once | ORAL | Status: AC
  Administered 2023-05-29: 40 meq via ORAL
  Filled 2023-05-29: qty 2

## 2023-05-29 MED ORDER — PREGABALIN 25 MG PO CAPS
150.0000 mg | ORAL_CAPSULE | Freq: Once | ORAL | Status: AC
  Administered 2023-05-29: 150 mg via ORAL
  Filled 2023-05-29: qty 2

## 2023-05-29 MED ORDER — LACTATED RINGERS IV BOLUS
500.0000 mL | Freq: Once | INTRAVENOUS | Status: AC
  Administered 2023-05-29: 500 mL via INTRAVENOUS

## 2023-05-29 MED ORDER — METHYLPHENIDATE HCL 5 MG PO TABS
20.0000 mg | ORAL_TABLET | Freq: Once | ORAL | Status: AC
  Administered 2023-05-29: 20 mg via ORAL
  Filled 2023-05-29: qty 4
  Filled 2023-05-29: qty 2

## 2023-05-29 MED ORDER — APIXABAN 5 MG PO TABS
5.0000 mg | ORAL_TABLET | Freq: Once | ORAL | Status: AC
  Administered 2023-05-29: 5 mg via ORAL
  Filled 2023-05-29: qty 1

## 2023-05-29 MED ORDER — PANTOPRAZOLE SODIUM 40 MG IV SOLR
40.0000 mg | Freq: Once | INTRAVENOUS | Status: AC
  Administered 2023-05-29: 40 mg via INTRAVENOUS
  Filled 2023-05-29: qty 10

## 2023-05-29 MED ORDER — METOCLOPRAMIDE HCL 10 MG PO TABS: 10.0000 mg | ORAL_TABLET | Freq: Three times a day (TID) | ORAL | 0 refills | Status: DC | PRN

## 2023-05-29 MED ORDER — ONDANSETRON HCL 4 MG/2ML IJ SOLN
4.0000 mg | Freq: Once | INTRAMUSCULAR | Status: DC
  Filled 2023-05-29: qty 2

## 2023-05-29 MED ORDER — LINACLOTIDE 145 MCG PO CAPS
290.0000 ug | ORAL_CAPSULE | Freq: Once | ORAL | Status: AC
  Administered 2023-05-29: 290 ug via ORAL
  Filled 2023-05-29: qty 2

## 2023-05-29 MED ORDER — POTASSIUM CHLORIDE 20 MEQ PO PACK
40.0000 meq | PACK | Freq: Two times a day (BID) | ORAL | Status: DC
  Administered 2023-05-29: 40 meq via ORAL
  Filled 2023-05-29: qty 2

## 2023-05-29 MED ORDER — DIPHENHYDRAMINE HCL 50 MG/ML IJ SOLN
25.0000 mg | Freq: Once | INTRAMUSCULAR | Status: DC
  Filled 2023-05-29: qty 1

## 2023-05-29 MED ORDER — METOCLOPRAMIDE HCL 5 MG/ML IJ SOLN
10.0000 mg | Freq: Once | INTRAMUSCULAR | Status: DC
  Filled 2023-05-29: qty 2

## 2023-05-29 NOTE — ED Notes (Signed)
Pt verbalized understanding of discharge instructions. Opportunity for questions provided.  

## 2023-05-29 NOTE — Discharge Instructions (Addendum)
It was our pleasure to provide your ER care today - we hope that you feel better.  Drink adequate fluids/stay well hydrated.   Follow up with your doctor/neurologist in the coming week.   Return to ER if worse, new symptoms, fevers, new/severe pain, trouble breathing, fainting, seizures, or other concern.

## 2023-05-29 NOTE — ED Provider Notes (Signed)
[name] Provider Note   CSN: [id number] Arrival date & time: 05/29/23  4098     History  No chief complaint on file.   [name] is a 42 y.o. female with past medical history hypokalemia, hypomagnesemia, pacemaker placement, POTS, cerebral palsy who presents to the ED complaining of feeling lightheaded, dizzy, and having chest pressure.  States that this started yesterday after receiving magnesium at [facility] ED.  She was being evaluated at that visit for constipation.  Since discharge from the ED yesterday, she has had 4 bowel movements and reports that she no longer has abdominal pain or feels constipated.  She has felt nauseated and had 1 episode of nonbloody emesis.  Also complains of mechanical fall 3 days ago and injuring her left knee.  Also requesting her morning medications today.  States that she has recently missed several doses of her Eliquis.  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  Notes that she is concerned for a DVT to her right upper extremity.      Home Medications Prior to Admission medications   Medication Sig Start Date End Date Taking? Authorizing Provider  metoCLOPramide (REGLAN) 10 MG tablet Take 1 tablet (10 mg total) by mouth every 8 (eight) hours as needed for up to 3 days for nausea or vomiting. 05/29/23 06/01/23 Yes Melida Northington L, PA-C  ELIQUIS 5 MG TABS tablet Take 5 mg by mouth 2 (two) times daily. 08/12/22   [provider]  famotidine (PEPCID) 40 MG tablet Take 40 mg by mouth daily as needed for heartburn. 03/25/22   [provider]  lactulose (CEPHULAC) 10 g packet Take 1 packet (10 g total) by mouth 3 (three) times daily as needed (constipation). Patient not taking: Reported on 12/10/2022 12/05/22   Tilden Fossa, MD  LINZESS 290 MCG CAPS capsule Take 290 mcg by mouth daily. 10/26/22   [provider]  magnesium (MAGTAB) 84 MG ( ) TBCR SR tablet Take 936 mg by mouth 3 (three)  times daily. 11/30/22   [provider]  methocarbamol (ROBAXIN) 500 MG tablet Take 500-1,000 mg by mouth every 6 (six) hours as needed (for back spasms).    [provider]  methylphenidate (RITALIN) 20 MG tablet Take 20 mg by mouth 3 (three) times daily. Take 1 tablet (20 mg) TID at 0800, 1200 & 2100-2200    [provider]  metoprolol succinate (TOPROL-XL) 25 MG 24 hr tablet Take 12.5 mg by mouth daily. 01/26/22   [provider]  montelukast (SINGULAIR) 10 MG tablet Take 10 mg by mouth daily as needed (allergies). 10/26/22   [provider]  pregabalin (LYRICA) 150 MG capsule Take 150 mg by mouth in the morning, at noon, and at bedtime.    [provider]  traMADol (ULTRAM) 50 MG tablet Take 50 mg by mouth every 6 (six) hours as needed for moderate pain. 09/20/13   [provider]  TRULANCE 3 MG TABS Take 1 tablet by mouth daily. 11/16/22   [provider]      Allergies    Duloxetine, Flecainide, Fluoxetine, Gabapentin, Baclofen, Cephalosporins, Sulfasalazine, Sulfa antibiotics, Vancomycin, and Cefazolin    Review of Systems   Review of Systems  All other systems reviewed and are negative.   Physical Exam Updated Vital Signs BP 119/86   Pulse 85   Temp 98.1 F (36.7 C) (Oral)   Resp 17   Ht 5\' 2"  (1.575 m)  Wt 39 kg   LMP 06/09/2012   SpO2 100%   BMI 15.73 kg/m  Physical Exam Vitals and nursing note reviewed.  Constitutional:      General: She is not in acute distress.    Appearance: Normal appearance. She is not ill-appearing or toxic-appearing.  HENT:     Head: Normocephalic and atraumatic.     Mouth/Throat:     Mouth: Mucous membranes are moist.  Eyes:     General: No scleral icterus.    Extraocular Movements: Extraocular movements intact.     Conjunctiva/sclera: Conjunctivae normal.  Cardiovascular:     Rate and Rhythm: Normal rate and regular rhythm.     Heart sounds: No murmur  heard. Pulmonary:     Effort: Pulmonary effort is normal.     Breath sounds: Normal breath sounds.  Abdominal:     General: Abdomen is flat. There is no distension.     Palpations: Abdomen is soft.     Tenderness: There is no abdominal tenderness. There is no guarding or rebound.  Musculoskeletal:     Cervical back: Normal range of motion and neck supple.     Right lower leg: No edema.     Left lower leg: No edema.     Comments: Chronic contractures of extremities, slight swelling and ecchymosis to left anterior knee, no obvious deformity, NVID, soft compartments, joint stable, no open wounds; no edema to BUE or BLE, right arm with no overlying skin changes, NVID  Skin:    General: Skin is warm and dry.     Capillary Refill: Capillary refill takes less than 2 seconds.  Neurological:     Mental Status: She is alert. Mental status is at baseline.  Psychiatric:        Behavior: Behavior is cooperative.     ED Results / Procedures / Treatments   Labs (all labs ordered are listed, but only abnormal results are displayed) Labs Reviewed  COMPREHENSIVE METABOLIC PANEL - Abnormal; Notable for the following components:      Result Value   Potassium 3.0 (*)    Glucose, Bld 112 (*)    All other components within normal limits  CBC - Abnormal; Notable for the following components:   Hemoglobin 11.9 (*)    All other components within normal limits  URINALYSIS, ROUTINE W REFLEX MICROSCOPIC - Abnormal; Notable for the following components:   APPearance HAZY (*)    Ketones, ur 5 (*)    Leukocytes,Ua SMALL (*)    All other components within normal limits  LIPASE, BLOOD  MAGNESIUM  TROPONIN I (HIGH SENSITIVITY)    EKG EKG Interpretation  Date/Time:  Sunday May 29 2023 06:43:47 EDT Ventricular Rate:  90 PR Interval:  140 QRS Duration: 118 QT Interval:  381 QTC Calculation: 467 R Axis:   102 Text Interpretation: ATRIAL PACED RHYTHM Non-specific ST-t changes Confirmed by Cathren Laine ([id number]) on 05/29/2023 7:37:27 AM  Radiology UE VENOUS DUPLEX (7am - 7pm)  Result Date: 05/29/2023 UPPER VENOUS STUDY  Patient Name:  [name]  Date of Exam:   05/29/2023 Medical Rec #: [id number]        Accession #:    [id number] Date of Birth: 1981/09/29        Patient Gender: F Patient Age:   48 years Exam Location:  [name] Procedure:      VAS [place] UPPER EXTREMITY VENOUS DUPLEX Referring Phys: [name] Karanvir Balderston --------------------------------------------------------------------------------  Indications: Pain, and Had magnesium via IV at  [place] yesterday, arm now sore 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 Limitations: Body habitus (BMI15.73. Comparison Study: No prior study Performing Technologist: Sherren Kerns RVS  Examination Guidelines: A complete evaluation includes B-mode imaging, spectral Doppler, color Doppler, and power Doppler as needed of all accessible portions of each vessel. Bilateral testing is considered an integral part of a complete examination. Limited examinations for reoccurring indications may be performed as noted.  Right Findings: +----------+------------+---------+-----------+----------+-------+ RIGHT     CompressiblePhasicitySpontaneousPropertiesSummary +----------+------------+---------+-----------+----------+-------+ IJV           Full                                          +----------+------------+---------+-----------+----------+-------+ Subclavian               Yes       Yes                      +----------+------------+---------+-----------+----------+-------+ Axillary                 Yes       Yes                      +----------+------------+---------+-----------+----------+-------+ Brachial      Full                                          +----------+------------+---------+-----------+----------+-------+ Radial        Full                                           +----------+------------+---------+-----------+----------+-------+ Ulnar         Full                                          +----------+------------+---------+-----------+----------+-------+ Cephalic      Full                                          +----------+------------+---------+-----------+----------+-------+ Basilic       Full                                          +----------+------------+---------+-----------+----------+-------+  Left Findings: +----------+------------+---------+-----------+----------+-------+ LEFT      CompressiblePhasicitySpontaneousPropertiesSummary +----------+------------+---------+-----------+----------+-------+ Subclavian               Yes       Yes                      +----------+------------+---------+-----------+----------+-------+  Summary:  Right: No evidence of deep vein thrombosis in the upper extremity. No evidence of superficial vein thrombosis in the upper extremity.  Left: No evidence of thrombosis in the subclavian.  *See table(s) above for measurements and observations.     Preliminary    DG Knee 2 Views Left  Result Date: 05/29/2023 CLINICAL DATA:  42 year old female  with history of chest pain and knee pain. EXAM: LEFT KNEE - 1-2 VIEW COMPARISON:  No priors. FINDINGS: Extensive soft tissue swelling is noted anterior to the patella and the upper tibia. No acute displaced fracture, subluxation or dislocation is noted. Chondrocalcinosis is noted in the medial and lateral compartment of the knee. IMPRESSION: 1. Extensive soft tissue swelling anterior to the knee. No underlying acute osseous abnormality. 2. Chondrocalcinosis. Electronically Signed   By: Trudie Reed [name]   On: 05/29/2023 07:51   DG Chest Port 1 View  Result Date: 05/29/2023 CLINICAL DATA:  42 year old female with history of chest pain and knee pain. EXAM: PORTABLE CHEST 1 VIEW COMPARISON:  Chest x-ray 04/30/2023. FINDINGS: Right internal jugular  single-lumen Port-A-Cath with tip terminating in the right atrium. Left-sided pacemaker device with lead tip projecting over the expected location of the right atrium. There also appear to be defibrillator leads in position, likely from a device in the abdomen. Lung volumes are normal. No consolidative airspace disease. No pleural effusions. No pneumothorax. No pulmonary nodule or mass noted. Pulmonary vasculature and the cardiomediastinal silhouette are within normal limits. IMPRESSION: No radiographic evidence of acute cardiopulmonary disease. Electronically Signed   By: Trudie Reed [name]   On: 05/29/2023 07:50   DG Abdomen 1 View  Result Date: 05/28/2023 CLINICAL DATA:  History of chronic constipation EXAM: ABDOMEN - 1 VIEW COMPARISON:  03/22/2023 FINDINGS: Fecal material is again seen throughout the colon consistent with constipation. No obstructive changes are noted. Stimulator pack is seen. No bony abnormality is noted. IMPRESSION: Changes consistent with chronic colonic constipation Electronically Signed   By: Alcide Clever [name]   On: 05/28/2023 03:15    Procedures Procedures    Medications Ordered in ED Medications  metoCLOPramide (REGLAN) injection 10 mg (10 mg Intravenous Patient Refused/Not Given 05/29/23 0830)  diphenhydrAMINE (BENADRYL) injection 25 mg (25 mg Intravenous Patient Refused/Not Given 05/29/23 0830)  potassium chloride SA (KLOR-CON M) CR tablet 40 mEq (has no administration in time range)  potassium chloride (KLOR-CON) packet 40 mEq (has no administration in time range)  lactated ringers bolus 500 mL (has no administration in time range)  pantoprazole (PROTONIX) injection 40 mg (has no administration in time range)  lactated ringers bolus 500 mL (500 mLs Intravenous New Bag/Given 05/29/23 0730)  apixaban (ELIQUIS) tablet 5 mg (5 mg Oral Given 05/29/23 0826)  methylphenidate (RITALIN) tablet 20 mg (20 mg Oral Given 05/29/23 0828)  pregabalin (LYRICA) capsule 150 mg (150 mg  Oral Given 05/29/23 0827)  linaclotide (LINZESS) capsule 290 mcg (290 mcg Oral Given 05/29/23 0825)    ED Course/ Medical Decision Making/ A&P                             Medical Decision Making Amount and/or Complexity of Data Reviewed Labs: ordered. Decision-making details documented in ED Course. Radiology: ordered. Decision-making details documented in ED Course. ECG/medicine tests: ordered. Decision-making details documented in ED Course.  Risk Prescription drug management.   Medical Decision Making:   [name] is a 42 y.o. female who presented to the ED today with multiple complaints including lightheadedness, dizziness, nausea, chest pain detailed above.    Patient's presentation is complicated by their history of CP, POTS.  Patient placed on continuous vitals and telemetry monitoring while in ED which was reviewed periodically.  Complete initial physical exam performed, notably the patient was in NAD. At neurological baseline.  Slight swelling and ecchymosis  over left knee. No respiratory distress. Reviewed and confirmed nursing documentation for past medical history, family history, social history.    Initial Assessment:   With the patient's presentation, differential diagnosis includes but is not limited to ACS, arrhythmia, electrolyte disturbance, dehydration, viral syndrome, DVT, orthostasis/POTS, knee contusion/fracture, atypical chest pain.  This is most consistent with an acute complicated illness  Initial Plan:  Screening labs including CBC and Metabolic panel to evaluate for infectious or metabolic etiology of disease.  CXR to evaluate for structural/infectious intrathoracic pathology.  EKG and troponin to evaluate for cardiac pathology Lipase to assess for pancreatitis, ordered by triage provider Magnesium to assess for electrolyte disturbance Knee x-ray to assess for traumatic injury AM Home medications have patient recommendation Symptomatic  management Objective evaluation as below reviewed   Initial Study Results:   Laboratory  All laboratory results reviewed without evidence of clinically relevant pathology.   Exceptions include: Potassium 3.0, hemoglobin 11.9, UA positive for ketones  EKG EKG was reviewed independently. Rate, rhythm, axis, intervals all examined and without medically relevant abnormality. ST segments without concerns for elevations.    Radiology:  All images reviewed independently. Agree with radiology report at this time.   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 Limitations: Body habitus (BMI15.73. Comparison Study: No prior study Performing Technologist: Sherren Kerns RVS  Examination Guidelines: A complete evaluation includes B-mode imaging, spectral Doppler, color Doppler, and power Doppler as needed of all accessible portions of each vessel. Bilateral testing is considered an integral part of a complete examination. Limited examinations for reoccurring indications may be performed as noted.  Right Findings: +----------+------------+---------+-----------+----------+-------+ RIGHT     CompressiblePhasicitySpontaneousPropertiesSummary +----------+------------+---------+-----------+----------+-------+ IJV           Full                                          +----------+------------+---------+-----------+----------+-------+ Subclavian               Yes        Yes                      +----------+------------+---------+-----------+----------+-------+ Axillary                 Yes       Yes                      +----------+------------+---------+-----------+----------+-------+ Brachial      Full                                          +----------+------------+---------+-----------+----------+-------+ Radial        Full                                          +----------+------------+---------+-----------+----------+-------+  Ulnar         Full                                          +----------+------------+---------+-----------+----------+-------+ Cephalic      Full                                          +----------+------------+---------+-----------+----------+-------+ Basilic       Full                                          +----------+------------+---------+-----------+----------+-------+  Left Findings: +----------+------------+---------+-----------+----------+-------+ LEFT      CompressiblePhasicitySpontaneousPropertiesSummary +----------+------------+---------+-----------+----------+-------+ Subclavian               Yes       Yes                      +----------+------------+---------+-----------+----------+-------+  Summary:  Right: No evidence of deep vein thrombosis in the upper extremity. No evidence of superficial vein thrombosis in the upper extremity.  Left: No evidence of thrombosis in the subclavian.  *See table(s) above for measurements and observations.     Preliminary    DG Knee 2 Views Left  Result Date: 05/29/2023 CLINICAL DATA:  42 year old female with history of chest pain and knee pain. EXAM: LEFT KNEE - 1-2 VIEW COMPARISON:  No priors. FINDINGS: Extensive soft tissue swelling is noted anterior to the patella and the upper tibia. No acute displaced fracture, subluxation or dislocation is noted. Chondrocalcinosis is noted in the medial and lateral compartment of the knee.  IMPRESSION: 1. Extensive soft tissue swelling anterior to the knee. No underlying acute osseous abnormality. 2. Chondrocalcinosis. Electronically Signed   By: Trudie Reed [name]   On: 05/29/2023 07:51   DG Chest Port 1 View  Result Date: 05/29/2023 CLINICAL DATA:  42 year old female with history of chest pain and knee pain. EXAM: PORTABLE CHEST 1 VIEW COMPARISON:  Chest x-ray 04/30/2023. FINDINGS: Right internal jugular single-lumen Port-A-Cath with tip terminating in the right atrium. Left-sided pacemaker device with lead tip projecting over the expected location of the right atrium. There also appear to be defibrillator leads in position, likely from a device in the abdomen. Lung volumes are normal. No consolidative airspace disease. No pleural effusions. No pneumothorax. No pulmonary nodule or mass noted. Pulmonary vasculature and the cardiomediastinal silhouette are within normal limits. IMPRESSION: No radiographic evidence of acute cardiopulmonary disease. Electronically Signed   By: Trudie Reed [name]   On: 05/29/2023 07:50   DG Abdomen 1 View  Result Date: 05/28/2023 CLINICAL DATA:  History of chronic constipation EXAM: ABDOMEN - 1 VIEW COMPARISON:  03/22/2023 FINDINGS: Fecal material is again seen throughout the colon consistent with constipation. No obstructive changes are noted. Stimulator pack is seen. No bony abnormality is noted. IMPRESSION: Changes consistent with chronic colonic constipation Electronically Signed   By: Alcide Clever [name]   On: 05/28/2023 03:15   CT ABDOMEN PELVIS W CONTRAST  Result Date: 04/30/2023 CLINICAL DATA:  Acute abdominal pain. Left lower extremity swelling. Wheelchair-bound patient with cerebral palsy. EXAM: CT ABDOMEN AND PELVIS WITH CONTRAST TECHNIQUE: Multidetector CT imaging of the abdomen and pelvis  was performed using the standard protocol following bolus administration of intravenous contrast. RADIATION DOSE REDUCTION: This exam was performed according  to the departmental dose-optimization program which includes automated exposure control, adjustment of the mA and/or kV according to patient size and/or use of iterative reconstruction technique. CONTRAST:  75mL OMNIPAQUE IOHEXOL 350 MG/ML SOLN COMPARISON:  12/10/2022 and 05/04/2017 FINDINGS: Lower Chest: No acute findings. Hepatobiliary:  No hepatic masses identified. Pancreas: No mass or inflammatory changes. 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 Spleen: Within normal limits in size and appearance. Adrenals/Urinary Tract: Tiny renal cysts noted bilaterally (no followup imaging is recommended). No suspicious masses identified. No evidence of ureteral calculi or hydronephrosis. Unremarkable unopacified urinary bladder. Stomach/Bowel: No evidence of obstruction, inflammatory process or abnormal fluid collections. Vascular/Lymphatic: No pathologically enlarged lymph nodes. No acute vascular findings. Reproductive: Prior hysterectomy noted. Adnexal regions are unremarkable in appearance. Other:  None. Musculoskeletal: No suspicious bone lesions identified. Pacemaker noted in the right abdominal wall subcutaneous tissues. IMPRESSION: No acute findings or other significant abnormality within the abdomen or pelvis. Electronically Signed   By: Danae Orleans [name]   On: 04/30/2023 18:58   CT Angio Chest PE W and/or Wo Contrast  Result Date: 04/30/2023 CLINICAL DATA:  Acute onset chest pain. Lower extremity swelling. Wheelchair-bound. High probability for pulmonary embolism. EXAM: CT ANGIOGRAPHY CHEST WITH CONTRAST TECHNIQUE: Multidetector CT imaging of the chest was performed using the standard protocol during bolus administration of intravenous contrast. Multiplanar CT image reconstructions and MIPs were obtained to evaluate the vascular anatomy. RADIATION DOSE REDUCTION: This exam was performed according to the departmental dose-optimization program which includes automated exposure  control, adjustment of the mA and/or kV according to patient size and/or use of iterative reconstruction technique. CONTRAST:  75mL OMNIPAQUE IOHEXOL 350 MG/ML SOLN COMPARISON:  07/10/2010 FINDINGS: Cardiovascular: Image degradation by motion artifact noted. Satisfactory opacification of pulmonary arteries noted, and no pulmonary emboli identified. No evidence of thoracic aortic dissection or aneurysm. Mediastinum/Nodes: No masses or pathologically enlarged lymph nodes identified. Lungs/Pleura: Mild pleural-parenchymal scarring seen in the lateral left upper lobe. No pulmonary mass, infiltrate, or effusion. Upper abdomen: No acute findings. Musculoskeletal: No suspicious bone lesions identified. Review of the MIP images confirms the above findings. IMPRESSION: Mildly degraded exam by respiratory motion artifact. No evidence of pulmonary embolism or other active disease within the thorax. Electronically Signed   By: Danae Orleans [name]   On: 04/30/2023 18:51   DG Chest 2 View  Result Date: 04/30/2023 CLINICAL DATA:  Chest pain EXAM: CHEST - 2 VIEW COMPARISON:  10/24/2022 FINDINGS: Right Port-A-Cath tip at mid right atrium. Left-sided single lead pacer. Inferior cardiac pacing device unchanged. Patient rotated right. Midline trachea. Normal heart size. No pleural effusion or pneumothorax. Clear lungs. IMPRESSION: No acute cardiopulmonary disease. Electronically Signed   By: Jeronimo Greaves [name]   On: 04/30/2023 16:27   DG Knee Complete 4 Views Left  Result Date: 04/30/2023 CLINICAL DATA:  Atraumatic left knee swelling EXAM: LEFT KNEE - COMPLETE 4+ VIEW COMPARISON:  None Available. FINDINGS: Frontal, bilateral oblique, and lateral views of the left knee are obtained. No fracture, subluxation, or dislocation. There is mild 3 compartmental osteoarthritis, with chondrocalcinosis in the medial and lateral compartments. No joint effusion. Marked prepatellar soft tissue swelling could reflect underlying prepatellar  bursitis. IMPRESSION: 1. Marked prepatellar soft tissue swelling, most consistent with prepatellar bursitis. 2. Mild 3 compartmental osteoarthritis. 3. No acute bony abnormality. Electronically Signed   By: Casimiro Needle  Manson Passey [name]   On: 04/30/2023 16:26      Final Assessment and Plan:   42 year old female presents to the ED with multiple complaints as above.  She is nontoxic-appearing on exam.  Vital signs reassuring.  Questionably slightly dehydrated so given fluids to help with this.  UA positive for small amount of ketones.  Small amount of leukocytes as well, no urinary symptoms, does not appear consistent with UTI.  Also reports chest pain.  Describes this as a burning in the mid chest.  Has a history of GERD for which she takes Carafate at home.  Notes that recently not taking medications over the last few days as has been at the [name] or at doctors appointments.  Troponin normal.  EKG without acute ST-T changes.  Chest x-ray negative.  Pain does not sound consistent with ACS.  Suspect GERD exacerbation.  Given dose of Protonix.  Also complained of nausea and vomiting so given Reglan to help with this.  No emesis in the ED.  Magnesium normal.  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  Repleted in the ED.  Normal kidney function.  Normal LFTs.  No leukocytosis.  Overall, reassuring workup.  Patient also stated that she has been off her Eliquis and was concerned for a DVT in her right upper extremity.  Vascular ultrasound negative.  Also noted mechanical fall with pain to the left knee.  No bony abnormality on XR.  Discussed all findings with patient and plan to discharge home.  Will provide with Reglan for nausea control at home and have her closely follow-up with PCP for any continued symptoms.  Patient expressed understanding of this.  Strict ED return precautions given, all questions answered, and stable for discharge.   Clinical Impression:  1.  Atypical chest pain   2. Hypokalemia   3. Dehydration   4. Gastroesophageal reflux disease without esophagitis      Discharge           Final Clinical Impression(s) / ED Diagnoses Final diagnoses:  Hypokalemia  Dehydration  Atypical chest pain  Gastroesophageal reflux disease without esophagitis    Rx / DC Orders ED Discharge Orders          Ordered    metoCLOPramide (REGLAN) 10 MG tablet  Every 8 hours PRN        05/29/23 0847              Tonette Lederer, PA-C 05/29/23 1610    Cathren Laine, MD 05/29/23 1601

## 2023-05-29 NOTE — Discharge Instructions (Addendum)
Thank you for letting us take care of you today.  Overall, your workup was reassuring.  Your potassium was low but your magnesium and other electrolytes were normal.  We do not see any issues with the bones on the x-ray of your knee.  You do not have a clot in your right arm.  Your EKG, heart enzymes, and chest x-ray were normal.  We do not see any damage to your heart.  You did appear dehydrated so we gave you fluids to help with this.  I am sending you home with Reglan to help with nausea as needed.  The burning in your chest that you describe sounds like reflux.  Continue your Carafate at home.  Follow-up closely with your PCP this week for discussion of your ED visit today and further recommendations for treatment of any continued symptoms.  For any new or worsening symptoms, return to the nearest ED for reevaluation.

## 2023-05-29 NOTE — ED Notes (Signed)
Pt's mother updated by this RN.  

## 2023-05-29 NOTE — ED Triage Notes (Addendum)
Pt bib [facility] from home where she was just taken home by [facility] and was still on the stretcher. Per PTAR she had a change in mental status and what seemed to be focal seizure activity. Pt arrives AOx4 but says she feels "foggy"  Has hx of one seizure but when she was a chid

## 2023-05-29 NOTE — Progress Notes (Signed)
VASCULAR LAB   Right upper extremity venous has been performed.  See CV proc for preliminary results.  Messaged negative results to [employer], PA-C  Laban Orourke, RVT 05/29/2023, 8:22 AM

## 2023-05-29 NOTE — ED Notes (Signed)
PTAR at bedside to transport pt. Pt verbalizes understanding of discharge instructions. Opportunity for questions and answers were provided. Pt discharged from the ED.

## 2023-05-29 NOTE — ED Provider Notes (Signed)
[place] EMERGENCY DEPARTMENT AT [facility] Provider Note   CSN: [id number] Arrival date & time: 05/29/23  1208     History  Chief Complaint  Patient presents with   Weakness    [name] is a 42 y.o. female.  Pt with hx CP, presents via EMS after pt was seen in ED earlier, was being transported back home, and transiently noted to feel 'spacey', and appeared shaky. No generalized tonic clonic seizure activity noted. No loc or syncope. No post-ictal type of acute change in mental status. No incontinence or oral injury. Pt does indicate recently hit head/bumped head. Pt denies any new change in speech or vision. No new numbness/weakness. No fever or chills. No current nausea/vomiting. No chest pain or sob. No abd pain. No gu c/o.   The history is provided by the patient, medical records and the EMS personnel. The history is limited by the condition of the patient.  Seizures      Home Medications Prior to Admission medications   Medication Sig Start Date End Date Taking? Authorizing Provider  ELIQUIS 5 MG TABS tablet Take 5 mg by mouth 2 (two) times daily. 08/12/22   [provider]  famotidine (PEPCID) 40 MG tablet Take 40 mg by mouth daily as needed for heartburn. 03/25/22   [provider]  lactulose (CEPHULAC) 10 g packet Take 1 packet (10 g total) by mouth 3 (three) times daily as needed (constipation). Patient not taking: Reported on 12/10/2022 12/05/22   Tilden Fossa, MD  LINZESS 290 MCG CAPS capsule Take 290 mcg by mouth daily. 10/26/22   [provider]  magnesium (MAGTAB) 84 MG ( ) TBCR SR tablet Take 936 mg by mouth 3 (three) times daily. 11/30/22   [provider]  methocarbamol (ROBAXIN) 500 MG tablet Take 500-1,000 mg by mouth every 6 (six) hours as needed (for back spasms).    [provider]  methylphenidate (RITALIN) 20 MG tablet Take 20 mg by mouth 3 (three) times daily. Take 1 tablet (20 mg) TID at  0800, 1200 & 2100-2200    [provider]  metoCLOPramide (REGLAN) 10 MG tablet Take 1 tablet (10 mg total) by mouth every 8 (eight) hours as needed for up to 3 days for nausea or vomiting. 05/29/23 06/01/23  Gowens, Mariah L, PA-C  metoprolol succinate (TOPROL-XL) 25 MG 24 hr tablet Take 12.5 mg by mouth daily. 01/26/22   [provider]  montelukast (SINGULAIR) 10 MG tablet Take 10 mg by mouth daily as needed (allergies). 10/26/22   [provider]  pregabalin (LYRICA) 150 MG capsule Take 150 mg by mouth in the morning, at noon, and at bedtime.    [provider]  traMADol (ULTRAM) 50 MG tablet Take 50 mg by mouth every 6 (six) hours as needed for moderate pain. 09/20/13   [provider]  TRULANCE 3 MG TABS Take 1 tablet by mouth daily. 11/16/22   [provider]      Allergies    Duloxetine, Flecainide, Fluoxetine, Gabapentin, Baclofen, Cephalosporins, Sulfasalazine, Sulfa antibiotics, Vancomycin, and Cefazolin    Review of Systems   Review of Systems  Constitutional:  Negative for fever.  HENT:  Negative for sore throat.   Eyes:  Negative for visual disturbance.  Respiratory:  Negative for cough and shortness of breath.   Cardiovascular:  Negative for chest pain.  Gastrointestinal:  Negative for vomiting.  Genitourinary:  Negative for dysuria.  Musculoskeletal:  Negative for neck pain  and neck stiffness.  Skin:  Negative for rash.  Neurological:  Negative for weakness, numbness and headaches.    Physical Exam Updated Vital Signs BP 117/76   Pulse 85   Temp 98.5 F (36.9 C) (Oral)   Resp 16   LMP 06/09/2012   SpO2 99%  Physical Exam Vitals and nursing note reviewed.  Constitutional:      Appearance: Normal appearance. She is well-developed.  HENT:     Head: Atraumatic.     Comments: No sinus or temporal tenderness.     Nose: Nose normal.     Mouth/Throat:     Mouth: Mucous membranes are moist.     Pharynx: Oropharynx is  clear.  Eyes:     General: No scleral icterus.    Conjunctiva/sclera: Conjunctivae normal.     Pupils: Pupils are equal, round, and reactive to light.  Neck:     Trachea: No tracheal deviation.     Comments: No stiffness or rigidity. Cardiovascular:     Rate and Rhythm: Normal rate and regular rhythm.     Pulses: Normal pulses.     Heart sounds: Normal heart sounds. No murmur heard.    No friction rub. No gallop.  Pulmonary:     Effort: Pulmonary effort is normal. No respiratory distress.     Breath sounds: Normal breath sounds.  Abdominal:     General: Bowel sounds are normal. There is no distension.     Palpations: Abdomen is soft.     Tenderness: There is no abdominal tenderness.  Genitourinary:    Comments: No cva tenderness.  Musculoskeletal:        General: No swelling or tenderness.     Cervical back: Normal range of motion and neck supple. No rigidity or tenderness. No muscular tenderness.  Skin:    General: Skin is warm and dry.     Findings: No rash.  Neurological:     Mental Status: She is alert.     Comments: Alert, speech normal. Moves bil extremities purposefully. Responds to questions appropriately. Follows commands.   Psychiatric:        Mood and Affect: Mood normal.     ED Results / Procedures / Treatments   Labs (all labs ordered are listed, but only abnormal results are displayed) Labs Reviewed - No data to display  EKG None  Radiology CT Head Wo Contrast  Result Date: 05/29/2023 CLINICAL DATA:  42 year old female with acute altered mental status and seizure like activity. History of cerebral palsy. Earlier presentation of chest pain. EXAM: CT HEAD WITHOUT CONTRAST TECHNIQUE: Contiguous axial images were obtained from the base of the skull through the vertex without intravenous contrast. RADIATION DOSE REDUCTION: This exam was performed according to the departmental dose-optimization program which includes automated exposure control, adjustment of the  mA and/or kV according to patient size and/or use of iterative reconstruction technique. COMPARISON:  Head CT 03/22/2023. FINDINGS: Brain: Stable cerebral volume. No ventriculomegaly. Chronic mild asymmetry of the hemispheres with apparent mild chronic rightward midline shift (series 3, image 13) is unchanged since 2006 head CTs. Chronic bilateral hypodensity in both posterior lentiform nuclei, also present in 2006, with no other encephalomalacia identified. No superimposed ventriculomegaly, evidence of mass lesion, intracranial hemorrhage or evidence of cortically based acute infarction. Stable gray-white differentiation. Vascular: No suspicious intracranial vascular hyperdensity. Skull: No acute osseous abnormality identified. Sinuses/Orbits: Visualized paranasal sinuses and mastoids are clear. Other: Visualized orbits and scalp soft tissues are within normal limits. IMPRESSION: 1. No  acute intracranial abnormality. 2. 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 Electronically Signed   By: Odessa Fleming [name]   On: 05/29/2023 13:40   UE VENOUS DUPLEX (7am - 7pm)  Result Date: 05/29/2023 UPPER VENOUS STUDY  Patient Name:  BESSE DELLA  Date of Exam:   05/29/2023 Medical Rec #: [id number]        Accession #:    [id number] Date of Birth: 12-27-1980        Patient Gender: F Patient Age:   82 years Exam Location:  [facility] Procedure:      VAS [place] UPPER EXTREMITY VENOUS DUPLEX Referring Phys: [facility] GOWENS --------------------------------------------------------------------------------  Indications: Pain, and Had magnesium via IV at [facility] yesterday, arm now sore 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 Limitations: Body habitus (BMI15.73. Comparison Study: No prior study Performing Technologist: Sherren Kerns RVS  Examination Guidelines: A complete evaluation includes B-mode imaging, spectral Doppler, color  Doppler, and power Doppler as needed of all accessible portions of each vessel. Bilateral testing is considered an integral part of a complete examination. Limited examinations for reoccurring indications may be performed as noted.  Right Findings: +----------+------------+---------+-----------+----------+-------+ RIGHT     CompressiblePhasicitySpontaneousPropertiesSummary +----------+------------+---------+-----------+----------+-------+ IJV           Full                                          +----------+------------+---------+-----------+----------+-------+ Subclavian               Yes       Yes                      +----------+------------+---------+-----------+----------+-------+ Axillary                 Yes       Yes                      +----------+------------+---------+-----------+----------+-------+ Brachial      Full                                          +----------+------------+---------+-----------+----------+-------+ Radial        Full                                          +----------+------------+---------+-----------+----------+-------+ Ulnar         Full                                          +----------+------------+---------+-----------+----------+-------+ Cephalic      Full                                          +----------+------------+---------+-----------+----------+-------+ Basilic       Full                                          +----------+------------+---------+-----------+----------+-------+  Left Findings: +----------+------------+---------+-----------+----------+-------+ LEFT      CompressiblePhasicitySpontaneousPropertiesSummary +----------+------------+---------+-----------+----------+-------+ Subclavian               Yes       Yes                      +----------+------------+---------+-----------+----------+-------+  Summary:  Right: No evidence of deep vein thrombosis in the upper  extremity. No evidence of superficial vein thrombosis in the upper extremity.  Left: No evidence of thrombosis in the subclavian.  *See table(s) above for measurements and observations.     Preliminary    DG Knee 2 Views Left  Result Date: 05/29/2023 CLINICAL DATA:  42 year old female with history of chest pain and knee pain. EXAM: LEFT KNEE - 1-2 VIEW COMPARISON:  No priors. FINDINGS: Extensive soft tissue swelling is noted anterior to the patella and the upper tibia. No acute displaced fracture, subluxation or dislocation is noted. Chondrocalcinosis is noted in the medial and lateral compartment of the knee. IMPRESSION: 1. Extensive soft tissue swelling anterior to the knee. No underlying acute osseous abnormality. 2. Chondrocalcinosis. Electronically Signed   By: Trudie Reed [name]   On: 05/29/2023 07:51   DG Chest Port 1 View  Result Date: 05/29/2023 CLINICAL DATA:  42 year old female with history of chest pain and knee pain. EXAM: PORTABLE CHEST 1 VIEW COMPARISON:  Chest x-ray 04/30/2023. FINDINGS: Right internal jugular single-lumen Port-A-Cath with tip terminating in the right atrium. Left-sided pacemaker device with lead tip projecting over the expected location of the right atrium. There also appear to be defibrillator leads in position, likely from a device in the abdomen. Lung volumes are normal. No consolidative airspace disease. No pleural effusions. No pneumothorax. No pulmonary nodule or mass noted. Pulmonary vasculature and the cardiomediastinal silhouette are within normal limits. IMPRESSION: No radiographic evidence of acute cardiopulmonary disease. Electronically Signed   By: Trudie Reed [name]   On: 05/29/2023 07:50   DG Abdomen 1 View  Result Date: 05/28/2023 CLINICAL DATA:  History of chronic constipation EXAM: ABDOMEN - 1 VIEW COMPARISON:  03/22/2023 FINDINGS: Fecal material is again seen throughout the colon consistent with constipation. No obstructive changes are noted.  Stimulator pack is seen. No bony abnormality is noted. IMPRESSION: Changes consistent with chronic colonic constipation Electronically Signed   By: Alcide Clever [name]   On: 05/28/2023 03:15    Procedures Procedures    Medications Ordered in ED Medications - No data to display  ED Course/ Medical Decision Making/ A&P                             Medical Decision Making Problems Addressed: Generalized weakness: acute illness or injury with systemic symptoms that poses a threat to life or bodily functions History of cerebral palsy: chronic illness or injury with exacerbation, progression, or side effects of treatment that poses a threat to life or bodily functions POTS (postural orthostatic tachycardia syndrome): chronic illness or injury with exacerbation, progression, or side effects of treatment that poses a threat to life or bodily functions  Amount and/or Complexity of Data Reviewed Independent Historian: EMS    Details: hx External Data Reviewed: labs and notes. Labs:  Decision-making details documented in ED Course. Radiology: ordered and independent interpretation performed. Decision-making details documented in ED Course.   Imaging ordered.  Reviewed nursing notes and prior charts for additional history.  Earlier labs/imaging reviewed.  Earlier labs reviewed/interpreted by me -  wbc and hct normal. K mildly low. Trop normal.   Earlier xrays reviewed/interpreted by me - no pna. No knee fx.   CT reviewed/interpreted by me - no hem.  Vitals normal, no current pain, nv or other acute c/o. No seizure activity noted.   Pt currently appears stable for d/c.   Rec pcp follow up.  Return precautions provided.           Final Clinical Impression(s) / ED Diagnoses Final diagnoses:  None    Rx / DC Orders ED Discharge Orders     None         Cathren Laine, MD 05/29/23 1413

## 2023-05-29 NOTE — ED Notes (Signed)
Pt mother updated on plan of care.

## 2023-05-29 NOTE — ED Notes (Signed)
Skylan Sampat (Mother) called ask for a update on [facility]. Her number [phone].

## 2023-05-29 NOTE — ED Notes (Signed)
Pt given graham crackers and Diet Sprite per request.

## 2023-05-29 NOTE — ED Triage Notes (Signed)
Pt BIB [facility] from home - lives with Mother - here for dizziness and vomiting today - seen yesterday at [facility] for constipation.

## 2023-07-26 ENCOUNTER — Emergency Department: Payer: Medicare HMO

## 2023-07-26 ENCOUNTER — Other Ambulatory Visit: Payer: Self-pay

## 2023-07-26 ENCOUNTER — Emergency Department
Admission: EM | Admit: 2023-07-26 | Discharge: 2023-07-27 | Disposition: A | Discharge status: Home / Self Care | Payer: Medicare HMO | Attending: Emergency Medicine | Admitting: Emergency Medicine

## 2023-07-26 DIAGNOSIS — Z950 Presence of cardiac pacemaker: Secondary | ICD-10-CM | POA: Diagnosis not present

## 2023-07-26 DIAGNOSIS — R1031 Right lower quadrant pain: Secondary | ICD-10-CM | POA: Diagnosis not present

## 2023-07-26 DIAGNOSIS — Z7901 Long term (current) use of anticoagulants: Secondary | ICD-10-CM | POA: Diagnosis not present

## 2023-07-26 DIAGNOSIS — R109 Unspecified abdominal pain: Secondary | ICD-10-CM

## 2023-07-26 DIAGNOSIS — J690 Pneumonitis due to inhalation of food and vomit: Secondary | ICD-10-CM | POA: Insufficient documentation

## 2023-07-26 DIAGNOSIS — E876 Hypokalemia: Secondary | ICD-10-CM | POA: Insufficient documentation

## 2023-07-26 LAB — CBC WITH DIFFERENTIAL/PLATELET
Abs Immature Granulocytes: 0.01 10*3/uL (ref 0.00–0.07)
Basophils Absolute: 0 10*3/uL (ref 0.0–0.1)
Basophils Relative: 1 %
Eosinophils Absolute: 0.1 10*3/uL (ref 0.0–0.5)
Eosinophils Relative: 2 %
HCT: 30.4 % — ABNORMAL LOW (ref 36.0–46.0)
Hemoglobin: 10 g/dL — ABNORMAL LOW (ref 12.0–15.0)
Immature Granulocytes: 0 %
Lymphocytes Relative: 32 %
Lymphs Abs: 1.1 10*3/uL (ref 0.7–4.0)
MCH: 27.1 pg (ref 26.0–34.0)
MCHC: 32.9 g/dL (ref 30.0–36.0)
MCV: 82.4 fL (ref 80.0–100.0)
Monocytes Absolute: 0.3 10*3/uL (ref 0.1–1.0)
Monocytes Relative: 8 %
Neutro Abs: 1.9 10*3/uL (ref 1.7–7.7)
Neutrophils Relative %: 57 %
Platelets: 148 10*3/uL — ABNORMAL LOW (ref 150–400)
RBC: 3.69 MIL/uL — ABNORMAL LOW (ref 3.87–5.11)
RDW: 14.4 % (ref 11.5–15.5)
WBC: 3.4 10*3/uL — ABNORMAL LOW (ref 4.0–10.5)
nRBC: 0 % (ref 0.0–0.2)

## 2023-07-26 LAB — BASIC METABOLIC PANEL
Anion gap: 9 (ref 5–15)
BUN: 16 mg/dL (ref 6–20)
CO2: 26 mmol/L (ref 22–32)
Calcium: 9 mg/dL (ref 8.9–10.3)
Chloride: 104 mmol/L (ref 98–111)
Creatinine, Ser: 0.7 mg/dL (ref 0.44–1.00)
GFR, Estimated: >60 mL/min (ref >60)
Glucose, Bld: 107 mg/dL — ABNORMAL HIGH (ref 70–99)
Potassium: 2.8 mmol/L — ABNORMAL LOW (ref 3.5–5.1)
Sodium: 139 mmol/L (ref 135–145)

## 2023-07-26 LAB — URINALYSIS, ROUTINE W REFLEX MICROSCOPIC
Bilirubin Urine: NEGATIVE
Glucose, UA: NEGATIVE mg/dL
Hgb urine dipstick: NEGATIVE
Ketones, ur: NEGATIVE mg/dL
Leukocytes,Ua: NEGATIVE
Nitrite: NEGATIVE
Protein, ur: NEGATIVE mg/dL
Specific Gravity, Urine: 1.019 (ref 1.005–1.030)
pH: 6 (ref 5.0–8.0)

## 2023-07-26 LAB — HEPATIC FUNCTION PANEL
ALT: 15 U/L (ref 0–44)
AST: 16 U/L (ref 15–41)
Albumin: 3.8 g/dL (ref 3.5–5.0)
Alkaline Phosphatase: 66 U/L (ref 38–126)
Bilirubin, Direct: <0.1 mg/dL (ref 0.0–0.2)
Total Bilirubin: 0.6 mg/dL (ref 0.3–1.2)
Total Protein: 6.2 g/dL — ABNORMAL LOW (ref 6.5–8.1)

## 2023-07-26 LAB — LIPASE, BLOOD: Lipase: 27 U/L (ref 11–51)

## 2023-07-26 LAB — MAGNESIUM: Magnesium: 1.1 mg/dL — ABNORMAL LOW (ref 1.7–2.4)

## 2023-07-26 MED ORDER — SODIUM CHLORIDE 0.9 % IV SOLN
500.0000 mg | Freq: Once | INTRAVENOUS | Status: AC
  Administered 2023-07-26: 500 mg via INTRAVENOUS
  Filled 2023-07-26: qty 5

## 2023-07-26 MED ORDER — IOHEXOL 300 MG/ML  SOLN: 100.0000 mL | Freq: Once | INTRAMUSCULAR | Status: DC | PRN

## 2023-07-26 MED ORDER — AZITHROMYCIN 250 MG PO TABS: 250.0000 mg | ORAL_TABLET | Freq: Every day | ORAL | 0 refills | Status: AC

## 2023-07-26 MED ORDER — POTASSIUM CHLORIDE 10 MEQ/100ML IV SOLN
10.0000 meq | Freq: Once | INTRAVENOUS | Status: AC
  Administered 2023-07-26: 10 meq via INTRAVENOUS
  Filled 2023-07-26: qty 100

## 2023-07-26 MED ORDER — MAGNESIUM SULFATE 2 GM/50ML IV SOLN
2.0000 g | INTRAVENOUS | Status: AC
  Administered 2023-07-26: 2 g via INTRAVENOUS
  Filled 2023-07-26: qty 50

## 2023-07-26 NOTE — ED Provider Notes (Signed)
[facility] Provider Note    Event Date/Time   First MD Initiated Contact with Patient 07/26/23 1104     (approximate)   History   Flank Pain   HPI  [name] is a 42 y.o. 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  She has had these symptoms previously and states they feel similar to when she was admitted to [facility] last month.  She also reports decreased urination.  She denies any vomiting.  She states that she occasionally has loose bowel movement but is mostly constipated and has not had a bowel movement in the last few days.  I reviewed the past medical records.  The patient had multiple ED visits in June for lightheadedness as well as constipation.  She was most recently admitted to the hospitalist service here in January for a GI bleed.  She was admitted at [facility] last month for preprocedure optimization before an InterStim procedure for chronic overactive bladder, but ended up having sterile color colitis, hypotension, and the procedure was deferred.   Physical Exam   Triage Vital Signs: ED Triage Vitals [07/26/23 1101]  Encounter Vitals Group     BP      Systolic BP Percentile      Diastolic BP Percentile      Pulse      Resp      Temp      Temp src      SpO2 100 %     Weight      Height      Head Circumference      Peak Flow      Pain Score      Pain Loc      Pain Education      Exclude from Growth Chart     Most recent vital signs: Vitals:   07/26/23 1400 07/26/23 1530  BP: 137/88   Pulse: 85   Resp: 15   Temp:  98.4 F (36.9 C)  SpO2: 100%      General: Awake, comfortable appearing, no distress.  CV:  Good peripheral perfusion.  Resp:  Normal effort.  Abd:  Mild right lower abdominal tenderness.  No peritoneal  signs.  No distention.  Other:  No inguinal or groin swelling.  No jaundice or scleral icterus.  Slightly dry mucous membranes.   ED Results / Procedures / Treatments   Labs (all labs ordered are listed, but only abnormal results are displayed) Labs Reviewed  CBC WITH DIFFERENTIAL/PLATELET - Abnormal; Notable for the following components:      Result Value   WBC 3.4 (*)    RBC 3.69 (*)    Hemoglobin 10.0 (*)    HCT 30.4 (*)    Platelets 148 (*)    All other components within normal limits  BASIC METABOLIC PANEL - Abnormal; Notable for the following components:   Potassium 2.8 (*)    Glucose, Bld 107 (*)    All other components within normal limits  HEPATIC FUNCTION PANEL - Abnormal; Notable for the following components:   Total Protein 6.2 (*)    All other components within normal limits  URINALYSIS, ROUTINE W REFLEX MICROSCOPIC - Abnormal; Notable for the following components:   Color, Urine YELLOW (*)    APPearance HAZY (*)    All  other components within normal limits  URINE CULTURE  LIPASE, BLOOD  MAGNESIUM     EKG  ED ECG REPORT I, Dionne Bucy, the attending physician, personally viewed and interpreted this ECG.  Date: 07/26/2023 EKG Time: 1106 Rate: 85 Rhythm: Atrial paced rhythm QRS Axis: normal Intervals: normal ST/T Wave abnormalities: normal Narrative Interpretation: no evidence of acute ischemia    RADIOLOGY  CT abdomen/pelvis: I independently viewed and interpreted the images; there are no dilated bowel loops or any free air or free fluid.  IMPRESSION:  1. Heterogeneous airspace opacity in the right lung base, consistent  with infection or aspiration.  2. No acute noncontrast CT findings of the abdomen or pelvis to  explain right flank pain. No urinary tract calculi or  hydronephrosis.  3. Large burden of stool and stool balls throughout the abdomen and  pelvis.  4. Numerous bilateral hemorrhagic or proteinaceous renal cysts. No   calculi or hydronephrosis.  5. Status post cholecystectomy and hysterectomy.    Chest x-ray: Pending   PROCEDURES:  Critical Care performed: No  Procedures   MEDICATIONS ORDERED IN ED: Medications  iohexol (OMNIPAQUE) 300 MG/ML solution 100 mL (has no administration in time range)  potassium chloride 10 mEq in 100 mL IVPB (has no administration in time range)  potassium chloride 10 mEq in 100 mL IVPB (has no administration in time range)  azithromycin (ZITHROMAX) 500 mg in sodium chloride 0.9 % 250 mL IVPB (500 mg Intravenous New Bag/Given 07/26/23 1615)     IMPRESSION / MDM / ASSESSMENT AND PLAN / ED COURSE  I reviewed the triage vital signs and the nursing notes.  42 year old female with PMH as noted above presents with recurrent right flank/abdominal pain, groin pain, decreased urinary output, and dysuria.  She reports that the symptoms are similar to when she was admitted at [facility] last month.  She has a history of chronic urinary symptoms and has had UTIs previously, but also was treated for cervical colitis during that admission.  Differential diagnosis includes, but is not limited to, UTI, pyelonephritis, ureteral stone, exacerbation of chronic overactive bladder, recurrent or worsening colitis, diverticulitis, other acute intra-abdominal etiology.  We will obtain lab workup, urinalysis, CT abdomen/pelvis, and reassess.  Patient's presentation is most consistent with acute presentation with potential threat to life or bodily function.  The patient is on the cardiac monitor to evaluate for evidence of arrhythmia and/or significant heart rate changes.   ----------------------------------------- 4:35 PM on 07/26/2023 -----------------------------------------  Lab workup is overall reassuring.  The patient's potassium is borderline low at 2.8 so I have ordered 2 runs of IV potassium to replete it.  Urinalysis is negative.  There is no leukocytosis.  CT shows no acute  intra-abdominal findings or any abnormality involving the urinary system.  However, there is evidence of possible right lower lobe opacity consistent with pneumonia.  I have added on a chest x-ray.  The patient does endorse some cough and shortness of breath although she is not hypoxic.  Overall I think the patient is likely stable for discharge home with oral antibiotics.  I have ordered a dose of azithromycin here.  I have also added on a magnesium even her history of hypomagnesemia, and a chest x-ray to further evaluate the finding seen on CT.  I am signing the patient out to the oncoming ED physician Dr. Scotty Court.   FINAL CLINICAL IMPRESSION(S) / ED DIAGNOSES   Final diagnoses:  Right flank pain  Aspiration pneumonia of right lower lobe,  unspecified aspiration pneumonia type [facility])     Rx / DC Orders   ED Discharge Orders     None        Note:  This document was prepared using Dragon voice recognition software and may include unintentional dictation errors.    Dionne Bucy, MD 07/26/23 [phone]

## 2023-07-26 NOTE — Discharge Instructions (Addendum)
Return to the ER for new, worsening, or persistent severe pain, difficulty urinating, shortness of breath, chest pain, fever, weakness, or any other new or worsening symptoms that concern you.

## 2023-07-26 NOTE — ED Notes (Signed)
Pt is straight cathed at this time with a 8 Fr. Catheter. Pt tolerated well. Only one attempt was made.

## 2023-07-26 NOTE — ED Triage Notes (Addendum)
Pt comes by EMS from home for graoin pain and right flank pain. Pt states she has decreased urinary output and it burns when she urinates. The pt describes the pain now as a stabbing pain. Pt took tramadol before calling EMS but did not help. Pt was at [facility] 3 weeks ago for colitis. Pt was txt w/ abx and sent home. A&Ox4. Pt's baseline is slurred speak due to Cerebral Palsy.

## 2023-07-27 DIAGNOSIS — R1031 Right lower quadrant pain: Secondary | ICD-10-CM | POA: Diagnosis not present

## 2023-07-27 MED ORDER — HEPARIN SOD (PORK) LOCK FLUSH 10 UNIT/ML IV SOLN
10.0000 [IU] | Freq: Once | INTRAVENOUS | Status: AC
  Administered 2023-07-27: 10 [IU]
  Filled 2023-07-27: qty 5

## 2023-07-27 MED ORDER — PREGABALIN 75 MG PO CAPS
150.0000 mg | ORAL_CAPSULE | Freq: Once | ORAL | Status: AC
  Administered 2023-07-27: 150 mg via ORAL
  Filled 2023-07-27: qty 2

## 2023-07-27 NOTE — ED Notes (Signed)
Pt called out needing to urinate. Pt placed on bedpan and taken off once done.

## 2023-08-20 ENCOUNTER — Other Ambulatory Visit: Payer: Self-pay

## 2023-08-20 ENCOUNTER — Emergency Department: Payer: Medicare HMO

## 2023-08-20 ENCOUNTER — Emergency Department
Admission: EM | Admit: 2023-08-20 | Discharge: 2023-08-20 | Disposition: A | Discharge status: Home / Self Care | Payer: Medicare HMO | Attending: Emergency Medicine | Admitting: Emergency Medicine

## 2023-08-20 DIAGNOSIS — K5909 Other constipation: Secondary | ICD-10-CM | POA: Diagnosis not present

## 2023-08-20 DIAGNOSIS — R1030 Lower abdominal pain, unspecified: Secondary | ICD-10-CM | POA: Diagnosis present

## 2023-08-20 LAB — COMPREHENSIVE METABOLIC PANEL
ALT: 20 U/L (ref 0–44)
AST: 20 U/L (ref 15–41)
Albumin: 3.9 g/dL (ref 3.5–5.0)
Alkaline Phosphatase: 70 U/L (ref 38–126)
Anion gap: 11 (ref 5–15)
BUN: 14 mg/dL (ref 6–20)
CO2: 26 mmol/L (ref 22–32)
Calcium: 8.9 mg/dL (ref 8.9–10.3)
Chloride: 105 mmol/L (ref 98–111)
Creatinine, Ser: 0.66 mg/dL (ref 0.44–1.00)
GFR, Estimated: >60 mL/min (ref >60)
Glucose, Bld: 104 mg/dL — ABNORMAL HIGH (ref 70–99)
Potassium: 2.9 mmol/L — ABNORMAL LOW (ref 3.5–5.1)
Sodium: 142 mmol/L (ref 135–145)
Total Bilirubin: 0.4 mg/dL (ref 0.3–1.2)
Total Protein: 6.4 g/dL — ABNORMAL LOW (ref 6.5–8.1)

## 2023-08-20 LAB — CBC
HCT: 30.5 % — ABNORMAL LOW (ref 36.0–46.0)
Hemoglobin: 10.3 g/dL — ABNORMAL LOW (ref 12.0–15.0)
MCH: 27.5 pg (ref 26.0–34.0)
MCHC: 33.8 g/dL (ref 30.0–36.0)
MCV: 81.6 fL (ref 80.0–100.0)
Platelets: 131 10*3/uL — ABNORMAL LOW (ref 150–400)
RBC: 3.74 MIL/uL — ABNORMAL LOW (ref 3.87–5.11)
RDW: 14.6 % (ref 11.5–15.5)
WBC: 5.3 10*3/uL (ref 4.0–10.5)
nRBC: 0 % (ref 0.0–0.2)

## 2023-08-20 LAB — LIPASE, BLOOD: Lipase: 30 U/L (ref 11–51)

## 2023-08-20 MED ORDER — LACTULOSE 10 GM/15ML PO SOLN
20.0000 g | Freq: Once | ORAL | Status: AC
  Administered 2023-08-20: 20 g via ORAL
  Filled 2023-08-20: qty 30

## 2023-08-20 MED ORDER — DOCUSATE SODIUM 50 MG/5ML PO LIQD
100.0000 mg | ORAL | Status: AC
  Administered 2023-08-20: 100 mg
  Filled 2023-08-20: qty 10

## 2023-08-20 MED ORDER — MAGNESIUM CITRATE PO SOLN
0.5000 | Freq: Once | ORAL | Status: AC
  Administered 2023-08-20: 0.5
  Filled 2023-08-20: qty 296

## 2023-08-20 MED ORDER — IOHEXOL 300 MG/ML  SOLN
50.0000 mL | Freq: Once | INTRAMUSCULAR | Status: AC | PRN
  Administered 2023-08-20: 50 mL via INTRAVENOUS

## 2023-08-20 MED ORDER — MAGNESIUM CITRATE PO SOLN
0.5000 | Freq: Once | ORAL | Status: AC
  Administered 2023-08-20: 0.5 via ORAL
  Filled 2023-08-20: qty 296

## 2023-08-20 MED ORDER — LACTULOSE 10 GM/15ML PO SOLN
10.0000 g | ORAL | Status: AC
  Administered 2023-08-20: 10 g
  Filled 2023-08-20: qty 30

## 2023-08-20 NOTE — ED Provider Notes (Signed)
DG Abd 2 Views  Result Date: 08/20/2023 CLINICAL DATA:  42 year old female with abdominal pain and constipation. Cerebral palsy. EXAM: ABDOMEN - 2 VIEW COMPARISON:  CT Abdomen and Pelvis 07/26/2023 and earlier. FINDINGS: Upright and supine views at 0611 hours. Partially visible right chest Port-A-Cath, left chest pacer. Abdominal generator device with epicardial pacer leads redemonstrated. Negative visible lung bases, mediastinal contour. Non obstructed bowel gas pattern. No pneumoperitoneum. Retained stool throughout the colon, moderate to large volume and similar to the CT Abdomen and Pelvis last month, radiographs in June. Chronic Colace *CRASH* that Stable cholecystectomy clips. Small body habitus. No acute osseous abnormality identified. IMPRESSION: 1. Nonobstructed bowel gas pattern and no free air with a large volume of retained stool similar to prior studies. 2. Negative lung bases. Chest Port-A-Cath, cardiac pacer and epicardial devices. Electronically Signed   By: Odessa Fleming [name]   On: 08/20/2023 07:18      ----------------------------------------- 9:26 AM on 08/20/2023 ----------------------------------------- Despite multiple laxatives as well as enema, patient reports no bowel movement.  She reports ongoing mild nausea and mild abdominal discomfort feeling constipated still.  Given the reported history of severe constipation and requirement for gastric decompression due to severity of constipation in the past as well as the patient's relative reluctance to go home for trial of bowel regimen, I have ordered further workup including labs basic metabolic panel, present metabolic panel, CBC and a CT scan to further evaluate for possible severe obstructive pathology constipation sterile colitis etc.  Does not appear to be in acute pain or distress.  The abdomen is not notably distended she does report is mildly tender throughout but no rebound or guarding.  She is not actively vomiting and is fully  alert and oriented   ----------------------------------------- 1:26 PM on 08/20/2023 -----------------------------------------   CT   MPRESSION:  1. Residual airspace opacity or dependent atelectasis in the right  lower lobe. (Noted no hypoxia or respiratory complaint no hypoxia, clinically doubt evidence of acute pneumonia) 2. Somewhat featureless appearance of the pancreas. Please correlate  with serum lipase levels to exclude pancreatitis. [Normal lipase] 3. Numerous bilateral renal cysts and some circumscribed  intermediate density renal masses. Further evaluation with renal  ultrasound may be considered non emergently. [Patient reports following with [facility] nephrology now] 4. 5 mm nonobstructive renal calculus in the lower pole of the left  kidney.  5. 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     Patient resting.  No emesis.  Does not appear in pain or discomfort.  Patient now inquiring if we checked her magnesium level, but in further discussion it seems that she is following closely and receiving outpatient infusions for this.  At this point I do not think it is necessary to check magnesium level in the context of today's setting.   Patient agreeable to return home, continue her bowel regimen, careful return precautions which we discussed.  She will follow-up with [facility] neph[facility].  Regarding renal cysts, patient reports that her doctor at [facility] is already following her for this as well   Return precautions and treatment recommendations and follow-up discussed with the patient who is agreeable with the plan.   nursing working to arrange appropriate transport, patient reports she typically utilizes wheelchair via   Sharyn Creamer, MD 08/20/23 1334

## 2023-08-20 NOTE — ED Triage Notes (Signed)
Pt presents to the ED via EMS c/o lower abd pain and constipation. Pt states she can't remember when her last bowel movement was. Pt c/o pain upon palpation of abd.

## 2023-08-20 NOTE — ED Notes (Signed)
Patient urinated while in bed. Pericare provided, and absorption pad changed

## 2023-08-20 NOTE — ED Notes (Signed)
Pt's port accessed by this Rn. Ct notified via secure chat that pt is ready for scan

## 2023-08-20 NOTE — ED Notes (Signed)
Patient urinated in bed on absorbent pads. Pericare provided to patient, and absorbent pads changed by this RN.

## 2023-08-20 NOTE — ED Notes (Signed)
Pt given saltine crackers and beverage.

## 2023-08-20 NOTE — Discharge Instructions (Signed)
? ?  Please return to the emergency room right away if you are to develop a fever, severe nausea, your pain becomes severe or worsens, you are unable to keep food down, begin vomiting any dark or bloody fluid, you develop any dark or bloody stools, feel dehydrated, or other new concerns or symptoms arise. ? ?

## 2023-08-20 NOTE — ED Notes (Signed)
Pt's mother called to get update on pt. Message taken by Diplomatic Services operational officer. This Rn spoke with pt to inform her of the update request, pt doesn't want her mother given an update.

## 2023-08-20 NOTE — ED Provider Notes (Signed)
[facility] Provider Note    Event Date/Time   First MD Initiated Contact with Patient 08/20/23 [phone]     (approximate)   History   Abdominal Pain and Constipation  Level 5 caveat: History is somewhat limited due to the patient's chronic verbal limitations due to cerebral palsy.  Although she tries to express herself sometimes it can be difficult to interpret what exactly she is saying.  HPI [name] is a 42 y.o. 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  She presents by EMS tonight complaining of lower abdominal pain and distention and constipation.  She says she cannot remember when was her last bowel movement.  She says she is worried because she was admitted to [facility] about 2 months ago because she was very constipated and it led to an "infection" in her belly and she had to stay in the hospital for several days.  She has not been vomiting.  No recent fever.  She says she has been taking MiraLAX but it does not help.  She has been receiving home magnesium infusions and just got one a few hours ago.     Physical Exam   Triage Vital Signs: ED Triage Vitals  Encounter Vitals Group     BP 08/20/23 0441 135/74     Systolic BP Percentile --      Diastolic BP Percentile --      Pulse Rate 08/20/23 0438 86     Resp 08/20/23 0438 16     Temp 08/20/23 0438 98.8 F (37.1 C)     Temp Source 08/20/23 0438 Oral     SpO2 08/20/23 0435 100 %     Weight 08/20/23 0439 40 kg (88 lb 2.9 oz)     Height 08/20/23 0439 1.575 m (5\' 2" )     Head Circumference --      Peak Flow --      Pain Score 08/20/23 0439 8     Pain Loc --      Pain Education --      Exclude from Growth Chart --     Most recent vital signs: Vitals:   08/20/23 0441 08/20/23 0630  BP: 135/74 135/87  Pulse:  84  Resp:     Temp:    SpO2:  100%    General: Awake, seems to be at her baseline.  No obvious distress. CV:  Good peripheral perfusion.  Regular rate and rhythm. Resp:  Normal effort. no accessory muscle usage nor intercostal retractions.   Abd:  No distention in the abdomen is soft.  She continued to talk to me throughout the exam and it does not appear to be tender to palpation.   ED Results / Procedures / Treatments   Labs (all labs ordered are listed, but only abnormal results are displayed) Labs Reviewed - No data to display    RADIOLOGY I viewed and interpreted the patient's two-view abdominal x-rays.  No evidence of SBO.   PROCEDURES:  Critical Care performed: No  Procedures    IMPRESSION / MDM / ASSESSMENT AND PLAN / ED COURSE  I reviewed the triage vital signs and the nursing notes.                              Differential diagnosis includes,  but is not limited to, acute on chronic constipation, chronic electrolyte abnormalities, stercoral colitis, SBO/ileus.  Patient's presentation is most consistent with exacerbation of chronic illness.  Labs/studies ordered: 2-view abdomen xrays  Interventions/Medications given:  Medications  lactulose (CHRONULAC) 10 GM/15ML solution 20 g (20 g Oral Given 08/20/23 3875)  magnesium citrate solution 0.5 Bottle (0.5 Bottles Oral Given 08/20/23 6433)  docusate (COLACE) 50 MG/5ML liquid 100 mg (100 mg Per Tube Given 08/20/23 0633)  lactulose (CHRONULAC) 10 GM/15ML solution 10 g (10 g Per Tube Given 08/20/23 2951)  magnesium citrate solution 0.5 Bottle (0.5 Bottles Per Tube Given 08/20/23 0634)    (Note:  hospital course my include additional interventions and/or labs/studies not listed above.)   I believe that the patient is uncomfortable and concerned about her issue developing into as bad of a problem as it was and July.  I read the admission and hospital course and discharge notes written by various physicians at [facility].  It sounds as if she  was going to have a urogynecological procedure but her blood pressure was low and she was found to be so constipated that she was at the borderline of stercoral colitis and required an aggressive bowel regimen inpatient for days and a course of treatment with antibiotics for a nonspecific probable intestinal infection.  I provided reassurance to the patient tonight and explained that we did not need to put in an NG tube that she has had in the past that she is not vomiting and that I doubt that she has as severe of symptoms that she has had previously.  After she was reluctant to try an enema but I pointed out that she was treated with regular enemas while she was at [facility] to try to help her problem.  Since she is receiving electrolyte infusions at home I do not think we need to proceed with IV placement or labs at the moment since she is chronically low on sodium and magnesium and it is unlikely to change the acute presentation at the moment.  Proceeding with special combination enema including all of the medications listed above in addition to one half bottle of magnesium citrate by mouth and 20 g lactulose by mouth.  Also obtaining abdominal x-ray is to evaluate for possible air-fluid levels.     Clinical Course as of 08/20/23 0853  Sat Aug 20, 2023  8841 DG Abd 2 Views I viewed and interpreted the patient's x-rays.  No obvious air-fluid levels.  Radiology report pending. [CF]  0704 Transferring ED care to Dr. Fanny Bien. [CF]    Clinical Course User Index [CF] Loleta Rose, MD     FINAL CLINICAL IMPRESSION(S) / ED DIAGNOSES   Final diagnoses:  Chronic constipation     Rx / DC Orders   ED Discharge Orders     None        Note:  This document was prepared using Dragon voice recognition software and may include unintentional dictation errors.   Loleta Rose, MD 08/20/23 [phone]

## 2023-08-22 NOTE — Group Note (Deleted)

## 2023-09-17 ENCOUNTER — Emergency Department: Payer: Medicare HMO

## 2023-09-17 ENCOUNTER — Other Ambulatory Visit: Payer: Self-pay

## 2023-09-17 ENCOUNTER — Emergency Department
Admission: EM | Admit: 2023-09-17 | Discharge: 2023-09-18 | Disposition: A | Discharge status: Home / Self Care | Payer: Medicare HMO | Attending: Emergency Medicine | Admitting: Emergency Medicine

## 2023-09-17 DIAGNOSIS — Z7901 Long term (current) use of anticoagulants: Secondary | ICD-10-CM | POA: Insufficient documentation

## 2023-09-17 DIAGNOSIS — R109 Unspecified abdominal pain: Secondary | ICD-10-CM | POA: Diagnosis present

## 2023-09-17 DIAGNOSIS — E876 Hypokalemia: Secondary | ICD-10-CM | POA: Diagnosis not present

## 2023-09-17 DIAGNOSIS — Z950 Presence of cardiac pacemaker: Secondary | ICD-10-CM | POA: Insufficient documentation

## 2023-09-17 DIAGNOSIS — K219 Gastro-esophageal reflux disease without esophagitis: Secondary | ICD-10-CM | POA: Diagnosis not present

## 2023-09-17 DIAGNOSIS — K5900 Constipation, unspecified: Secondary | ICD-10-CM | POA: Insufficient documentation

## 2023-09-17 LAB — URINALYSIS, ROUTINE W REFLEX MICROSCOPIC
Bilirubin Urine: NEGATIVE
Glucose, UA: NEGATIVE mg/dL
Ketones, ur: NEGATIVE mg/dL
Leukocytes,Ua: NEGATIVE
Nitrite: NEGATIVE
Protein, ur: NEGATIVE mg/dL
Specific Gravity, Urine: 1.012 (ref 1.005–1.030)
pH: 7 (ref 5.0–8.0)

## 2023-09-17 LAB — COMPREHENSIVE METABOLIC PANEL
ALT: 31 U/L (ref 0–44)
AST: 33 U/L (ref 15–41)
Albumin: 4 g/dL (ref 3.5–5.0)
Alkaline Phosphatase: 68 U/L (ref 38–126)
Anion gap: 12 (ref 5–15)
BUN: 17 mg/dL (ref 6–20)
CO2: 29 mmol/L (ref 22–32)
Calcium: 9.2 mg/dL (ref 8.9–10.3)
Chloride: 98 mmol/L (ref 98–111)
Creatinine, Ser: 0.85 mg/dL (ref 0.44–1.00)
GFR, Estimated: >60 mL/min (ref >60)
Glucose, Bld: 124 mg/dL — ABNORMAL HIGH (ref 70–99)
Potassium: 2.6 mmol/L — CL (ref 3.5–5.1)
Sodium: 139 mmol/L (ref 135–145)
Total Bilirubin: 0.7 mg/dL (ref 0.3–1.2)
Total Protein: 6.9 g/dL (ref 6.5–8.1)

## 2023-09-17 LAB — CBC
HCT: 33.6 % — ABNORMAL LOW (ref 36.0–46.0)
Hemoglobin: 11.4 g/dL — ABNORMAL LOW (ref 12.0–15.0)
MCH: 27.1 pg (ref 26.0–34.0)
MCHC: 33.9 g/dL (ref 30.0–36.0)
MCV: 79.8 fL — ABNORMAL LOW (ref 80.0–100.0)
Platelets: 118 10*3/uL — ABNORMAL LOW (ref 150–400)
RBC: 4.21 MIL/uL (ref 3.87–5.11)
RDW: 13.4 % (ref 11.5–15.5)
WBC: 5.4 10*3/uL (ref 4.0–10.5)
nRBC: 0 % (ref 0.0–0.2)

## 2023-09-17 LAB — LIPASE, BLOOD: Lipase: 25 U/L (ref 11–51)

## 2023-09-17 LAB — MAGNESIUM: Magnesium: 1.4 mg/dL — ABNORMAL LOW (ref 1.7–2.4)

## 2023-09-17 MED ORDER — LACTULOSE 10 GM/15ML PO SOLN
30.0000 g | Freq: Once | ORAL | Status: AC
  Administered 2023-09-17: 30 g via ORAL
  Filled 2023-09-17: qty 60

## 2023-09-17 MED ORDER — POTASSIUM CHLORIDE 10 MEQ/100ML IV SOLN
10.0000 meq | Freq: Once | INTRAVENOUS | Status: AC
  Administered 2023-09-18: 10 meq via INTRAVENOUS
  Filled 2023-09-17: qty 100

## 2023-09-17 MED ORDER — POTASSIUM CHLORIDE 10 MEQ/100ML IV SOLN
10.0000 meq | Freq: Once | INTRAVENOUS | Status: AC
  Administered 2023-09-17: 10 meq via INTRAVENOUS
  Filled 2023-09-17: qty 100

## 2023-09-17 MED ORDER — MAGNESIUM SULFATE 2 GM/50ML IV SOLN
2.0000 g | Freq: Once | INTRAVENOUS | Status: AC
  Administered 2023-09-17: 2 g via INTRAVENOUS
  Filled 2023-09-17: qty 50

## 2023-09-17 MED ORDER — SORBITOL 70 % SOLN
960.0000 mL | TOPICAL_OIL | Freq: Once | ORAL | Status: AC
  Administered 2023-09-18: 960 mL via RECTAL
  Filled 2023-09-17: qty 240

## 2023-09-17 NOTE — ED Provider Notes (Signed)
[facility] Provider Note    Event Date/Time   First MD Initiated Contact with Patient 09/17/23 2303     (approximate)   History   Abdominal Pain   HPI  [name] is a 42 y.o. female brought to the ED via EMS from home with a chief complaint of abdominal pain, constipation.  Patient with a history of cerebral palsy, Gettleman syndrome, chronic electrolyte deficiencies, chronic constipation who symptoms began 2 days ago.  Reports right sided abdominal to flank pain, concern for kidney stone.  Denies fever/chills, chest pain, shortness of breath, nausea, vomiting or dizziness.     Past Medical History   Past Medical History:  Diagnosis Date   Carpal tunnel syndrome    Cerebral palsy (HCC)    Chronic back pain    Gitelman syndrome    Hypokalemia    now resolved   Magnesium deficiency    Pacemaker    PONV (postoperative nausea and vomiting)    Postural orthostatic tachycardia syndrome    s/p pacemaker placement.   RSD (reflex sympathetic dystrophy)    right foot,    RSD (reflex sympathetic dystrophy)    SA node dysfunction (HCC) 2008     Active Problem List   Patient Active Problem List   Diagnosis Date Noted   Grade I hemorrhoids 12/13/2022   Constipation 12/11/2022   GI bleeding 12/10/2022   Essential hypertension 12/10/2022   IBS (irritable bowel syndrome) 12/10/2022   GERD without esophagitis 12/10/2022   Hypokalemia 12/10/2022   Rectal bleeding 12/10/2022   Influenza 10/25/2022   Right ovarian cyst 05/31/2015   Abdominal pain, chronic, right lower quadrant 05/23/2015   Hypomagnesemia 03/02/2014   Back pain 03/02/2014     Past Surgical History   Past Surgical History:  Procedure Laterality Date   ABDOMINAL HYSTERECTOMY Left 2013   Dr. Tiburcio Pea   COLONOSCOPY WITH PROPOFOL N/A 12/11/2022   Procedure: COLONOSCOPY WITH PROPOFOL;  Surgeon: Toney Reil, MD;  Location[facility] ENDOSCOPY;  Service: Gastroenterology;   Laterality: N/A;   COLONOSCOPY WITH PROPOFOL N/A 12/13/2022   Procedure: COLONOSCOPY WITH PROPOFOL;  Surgeon: Toney Reil, MD;  Location: [facility] ENDOSCOPY;  Service: Gastroenterology;  Laterality: N/A;   INSERT / REPLACE / REMOVE PACEMAKER     ORTHOPEDIC SURGERY     PACEMAKER INSERTION     PORTACATH PLACEMENT       Home Medications   Prior to Admission medications   Medication Sig Start Date End Date Taking? Authorizing Provider  ELIQUIS 5 MG TABS tablet Take 5 mg by mouth 2 (two) times daily. 08/12/22   [provider]  famotidine (PEPCID) 40 MG tablet Take 40 mg by mouth daily as needed for heartburn. 03/25/22   [provider]  lactulose (CEPHULAC) 10 g packet Take 1 packet (10 g total) by mouth 3 (three) times daily as needed (constipation). Patient not taking: Reported on 12/10/2022 12/05/22   Tilden Fossa, MD  LINZESS 290 MCG CAPS capsule Take 290 mcg by mouth daily. 10/26/22   [provider]  magnesium (MAGTAB) 84 MG ( ) TBCR SR tablet Take 936 mg by mouth 3 (three) times daily. 11/30/22   [provider]  methocarbamol (ROBAXIN) 500 MG tablet Take 500-1,000 mg by mouth every 6 (six) hours as needed (for back spasms).    [provider]  methylphenidate (RITALIN) 20 MG tablet Take 20 mg by mouth 3 (three) times daily. Take 1 tablet (20 mg) TID at 0800, 1200 &  2100-2200    [provider]  metoCLOPramide (REGLAN) 10 MG tablet Take 1 tablet (10 mg total) by mouth every 8 (eight) hours as needed for up to 3 days for nausea or vomiting. 05/29/23 06/01/23  Gowens, Mariah L, PA-C  metoprolol succinate (TOPROL-XL) 25 MG 24 hr tablet Take 12.5 mg by mouth daily. 01/26/22   [provider]  montelukast (SINGULAIR) 10 MG tablet Take 10 mg by mouth daily as needed (allergies). 10/26/22   [provider]  pregabalin (LYRICA) 150 MG capsule Take 150 mg by mouth in the morning, at noon, and at bedtime.    [provider]  traMADol (ULTRAM) 50 MG tablet Take 50 mg by mouth every 6 (six) hours as needed for moderate pain. 09/20/13   [provider]  TRULANCE 3 MG TABS Take 1 tablet by mouth daily. 11/16/22   [provider]     Allergies  Duloxetine, Flecainide, Fluoxetine, Gabapentin, Baclofen, Cephalosporins, Sulfasalazine, Sulfa antibiotics, Vancomycin, and Cefazolin   Family History   Family History  Problem Relation Age of Onset   Diabetes Mother    Hypertension Mother      Physical Exam  Triage Vital Signs: ED Triage Vitals  Encounter Vitals Group     BP 09/17/23 2121 114/78     Systolic BP Percentile --      Diastolic BP Percentile --      Pulse Rate 09/17/23 2121 94     Resp 09/17/23 2121 18     Temp 09/17/23 2121 99 F (37.2 C)     Temp Source 09/17/23 2121 Oral     SpO2 09/17/23 2121 99 %     Weight 09/17/23 2122 88 lb 2.9 oz (40 kg)     Height 09/17/23 2122 5\' 2"  (1.575 m)     Head Circumference --      Peak Flow --      Pain Score 09/17/23 2121 9     Pain Loc --      Pain Education --      Exclude from Growth Chart --     Updated Vital Signs: BP 114/78   Pulse 94   Temp 99 F (37.2 C) (Oral)   Resp 18   Ht 5\' 2"  (1.575 m)   Wt 40 kg   LMP 06/09/2012   SpO2 99%   BMI 16.13 kg/m    General: Awake, no distress.  CV:  RRR.  Good peripheral perfusion.  Resp:  Normal effort.  CTAB. Abd:  Nontender to light or deep palpation.  No distention.  Other:  Baseline contractures from cerebral palsy.   ED Results / Procedures / Treatments  Labs (all labs ordered are listed, but only abnormal results are displayed) Labs Reviewed  COMPREHENSIVE METABOLIC PANEL - Abnormal; Notable for the following components:      Result Value   Potassium 2.6 (*)    Glucose, Bld 124 (*)    All other components within normal limits  CBC - Abnormal; Notable for the following components:   Hemoglobin 11.4 (*)    HCT 33.6 (*)    MCV 79.8 (*)    Platelets  118 (*)    All other components within normal limits  MAGNESIUM - Abnormal; Notable for the following components:   Magnesium 1.4 (*)    All other components within normal limits  LIPASE, BLOOD  URINALYSIS, ROUTINE W REFLEX MICROSCOPIC     EKG  None   RADIOLOGY I have independently visualized  and interpreted patient's imaging study as well as noted the radiology interpretation:  CT renal stone: No acute abnormality, large volume of stool  Official radiology report(s): CT Renal Stone Study  Result Date: 09/17/2023 CLINICAL DATA:  Abdominal/flank pain, stone suspected Right-sided pain. EXAM: CT ABDOMEN AND PELVIS WITHOUT CONTRAST TECHNIQUE: Multidetector CT imaging of the abdomen and pelvis was performed following the standard protocol without IV contrast. RADIATION DOSE REDUCTION: This exam was performed according to the departmental dose-optimization program which includes automated exposure control, adjustment of the mA and/or kV according to patient size and/or use of iterative reconstruction technique. COMPARISON:  Most recent CT 08/20/2023.  Multiple priors reviewed. FINDINGS: Lower chest: Resolved right lung base opacity. No new airspace disease. Hepatobiliary: No evidence of focal liver abnormality. Clips in the gallbladder fossa postcholecystectomy. No biliary dilatation. Pancreas: Not well-defined on this exam. No evidence of inflammation. Spleen: No acute findings. Adrenals/Urinary Tract: No adrenal nodule. No hydronephrosis or perinephric edema. Rounded areas of high density in both kidneys correspond to hemorrhagic or proteinaceous cysts, stable. Left lower pole calcification may be a parenchymal calcification or nonobstructing stone. No evidence of ureteral calculus partially distended urinary bladder, normal for degree of distension. Stomach/Bowel: Large volume of stool throughout the colon. No bowel obstruction or inflammation. The appendix is potentially but not definitively  seen. No appendicitis. Vascular/Lymphatic: Vascular structures are poorly assessed in the absence of contrast and paucity of intra-abdominal fat. No bulky adenopathy. Reproductive: Hysterectomy.  No adnexal mass. Other: No free fluid or free air. Right abdominal wall battery pack with lead coursing superiorly. Musculoskeletal: There are no acute or suspicious osseous abnormalities. IMPRESSION: 1. No acute abnormality in the abdomen/pelvis. No obstructive uropathy. 2. Large volume of stool throughout the colon, can be seen with constipation. Electronically Signed   By: Narda Rutherford [name]   On: 09/17/2023 22:51     PROCEDURES:  Critical Care performed: No  Procedures   MEDICATIONS ORDERED IN ED: Medications - No data to display   IMPRESSION / MDM / ASSESSMENT AND PLAN / ED COURSE  I reviewed the triage vital signs and the nursing notes.                             42 year old female presenting with abdominal pain, constipation. Differential diagnosis includes, but is not limited to, ovarian cyst, ovarian torsion, acute appendicitis, diverticulitis, urinary tract infection/pyelonephritis, endometriosis, bowel obstruction, colitis, renal colic, gastroenteritis, hernia, fibroids, etc. I personally viewed patient's records and note a family medicine visit on 09/15/2023 for chronic constipation and infected wound.  Patient's presentation is most consistent with exacerbation of chronic illness.  Laboratory results remarkable for hypokalemia with potassium 2.6, hypomagnesemia with magnesium 1.4.  UA, CT unremarkable.  Will replete electrolytes via IV, administer SMOG enema and reassess.      FINAL CLINICAL IMPRESSION(S) / ED DIAGNOSES   Final diagnoses:  Abdominal pain, unspecified abdominal location  Hypokalemia  Hypomagnesemia  Constipation, unspecified constipation type     Rx / DC Orders   ED Discharge Orders     None        Note:  This document was prepared using Dragon  voice recognition software and may include unintentional dictation errors.

## 2023-09-17 NOTE — ED Triage Notes (Signed)
First Nurse Note:  BIB [facility] from home. Pt c/o RLQ abd pain with radiation to back/L flank. Also reports concern for constipation with no bm in 1 wk.    EMS VS: 101 temp- refused tylenol with EMS 118/74 HR 90 98% RA CBG 116 RR 17

## 2023-09-17 NOTE — ED Triage Notes (Signed)
Pt presents via EMS c/o abd pain that radiates to her right flank. Reports " I think it may be a stone". Reports pain started on Thursday.

## 2023-09-18 DIAGNOSIS — R109 Unspecified abdominal pain: Secondary | ICD-10-CM | POA: Diagnosis not present

## 2023-09-18 MED ORDER — LACTULOSE 10 GM/15ML PO SOLN: 20.0000 g | Freq: Every day | ORAL | 0 refills | Status: DC | PRN

## 2023-09-18 NOTE — Discharge Instructions (Signed)
Take Lactulose as needed for bowel movements.  Drink plenty of fluids daily.  Return to the ER for worsening symptoms, persistent vomiting, difficulty breathing or other concerns.

## 2023-09-18 NOTE — ED Notes (Signed)
[facility] to transport pt to home

## 2023-09-18 NOTE — ED Notes (Signed)
Called for update on [facility] transport, per C-com, no eta at this time

## 2023-09-18 NOTE — ED Notes (Signed)
Pt had successful BM with large amount of stool.

## 2023-09-18 NOTE — ED Notes (Signed)
Pt resting with eyes closed at this time. Chest rise and fall noted.

## 2023-09-19 ENCOUNTER — Encounter: Payer: Self-pay | Admitting: Intensive Care

## 2023-09-19 ENCOUNTER — Inpatient Hospital Stay
Admission: EM | Admit: 2023-09-19 | Discharge: 2023-09-28 | DRG: 389 | Disposition: A | Discharge status: Home Health | Payer: Medicare HMO | Attending: Student | Admitting: Student

## 2023-09-19 ENCOUNTER — Emergency Department: Payer: Medicare HMO

## 2023-09-19 ENCOUNTER — Other Ambulatory Visit: Payer: Self-pay

## 2023-09-19 DIAGNOSIS — E876 Hypokalemia: Secondary | ICD-10-CM | POA: Diagnosis not present

## 2023-09-19 DIAGNOSIS — I10 Essential (primary) hypertension: Secondary | ICD-10-CM | POA: Diagnosis present

## 2023-09-19 DIAGNOSIS — R002 Palpitations: Secondary | ICD-10-CM | POA: Diagnosis present

## 2023-09-19 DIAGNOSIS — R079 Chest pain, unspecified: Secondary | ICD-10-CM | POA: Diagnosis not present

## 2023-09-19 DIAGNOSIS — K5900 Constipation, unspecified: Secondary | ICD-10-CM | POA: Diagnosis present

## 2023-09-19 DIAGNOSIS — E440 Moderate protein-calorie malnutrition: Secondary | ICD-10-CM | POA: Diagnosis present

## 2023-09-19 DIAGNOSIS — E878 Other disorders of electrolyte and fluid balance, not elsewhere classified: Secondary | ICD-10-CM | POA: Diagnosis not present

## 2023-09-19 DIAGNOSIS — K5641 Fecal impaction: Principal | ICD-10-CM | POA: Diagnosis present

## 2023-09-19 DIAGNOSIS — I82409 Acute embolism and thrombosis of unspecified deep veins of unspecified lower extremity: Secondary | ICD-10-CM | POA: Diagnosis present

## 2023-09-19 DIAGNOSIS — R1031 Right lower quadrant pain: Secondary | ICD-10-CM | POA: Diagnosis not present

## 2023-09-19 DIAGNOSIS — Q9351 Angelman syndrome: Secondary | ICD-10-CM

## 2023-09-19 DIAGNOSIS — R54 Age-related physical debility: Secondary | ICD-10-CM | POA: Diagnosis present

## 2023-09-19 DIAGNOSIS — G8929 Other chronic pain: Secondary | ICD-10-CM | POA: Diagnosis present

## 2023-09-19 DIAGNOSIS — L03113 Cellulitis of right upper limb: Secondary | ICD-10-CM | POA: Diagnosis present

## 2023-09-19 DIAGNOSIS — G809 Cerebral palsy, unspecified: Secondary | ICD-10-CM | POA: Diagnosis present

## 2023-09-19 DIAGNOSIS — Z833 Family history of diabetes mellitus: Secondary | ICD-10-CM

## 2023-09-19 DIAGNOSIS — Z950 Presence of cardiac pacemaker: Secondary | ICD-10-CM

## 2023-09-19 DIAGNOSIS — I495 Sick sinus syndrome: Secondary | ICD-10-CM | POA: Diagnosis present

## 2023-09-19 DIAGNOSIS — D696 Thrombocytopenia, unspecified: Secondary | ICD-10-CM | POA: Diagnosis present

## 2023-09-19 DIAGNOSIS — Z882 Allergy status to sulfonamides status: Secondary | ICD-10-CM

## 2023-09-19 DIAGNOSIS — Z681 Body mass index (BMI) 19 or less, adult: Secondary | ICD-10-CM

## 2023-09-19 DIAGNOSIS — Z888 Allergy status to other drugs, medicaments and biological substances status: Secondary | ICD-10-CM

## 2023-09-19 DIAGNOSIS — Z7901 Long term (current) use of anticoagulants: Secondary | ICD-10-CM

## 2023-09-19 DIAGNOSIS — K219 Gastro-esophageal reflux disease without esophagitis: Secondary | ICD-10-CM | POA: Diagnosis present

## 2023-09-19 DIAGNOSIS — I959 Hypotension, unspecified: Secondary | ICD-10-CM | POA: Diagnosis present

## 2023-09-19 DIAGNOSIS — Z86718 Personal history of other venous thrombosis and embolism: Secondary | ICD-10-CM

## 2023-09-19 DIAGNOSIS — Z881 Allergy status to other antibiotic agents status: Secondary | ICD-10-CM

## 2023-09-19 DIAGNOSIS — Z79899 Other long term (current) drug therapy: Secondary | ICD-10-CM

## 2023-09-19 DIAGNOSIS — Z8249 Family history of ischemic heart disease and other diseases of the circulatory system: Secondary | ICD-10-CM

## 2023-09-19 DIAGNOSIS — E559 Vitamin D deficiency, unspecified: Secondary | ICD-10-CM | POA: Diagnosis present

## 2023-09-19 LAB — CBC
HCT: 33.8 % — ABNORMAL LOW (ref 36.0–46.0)
Hemoglobin: 11.1 g/dL — ABNORMAL LOW (ref 12.0–15.0)
MCH: 26.2 pg (ref 26.0–34.0)
MCHC: 32.8 g/dL (ref 30.0–36.0)
MCV: 79.7 fL — ABNORMAL LOW (ref 80.0–100.0)
Platelets: 123 10*3/uL — ABNORMAL LOW (ref 150–400)
RBC: 4.24 MIL/uL (ref 3.87–5.11)
RDW: 13.1 % (ref 11.5–15.5)
WBC: 5.2 10*3/uL (ref 4.0–10.5)
nRBC: 0 % (ref 0.0–0.2)

## 2023-09-19 LAB — BASIC METABOLIC PANEL
Anion gap: 14 (ref 5–15)
BUN: 31 mg/dL — ABNORMAL HIGH (ref 6–20)
CO2: 25 mmol/L (ref 22–32)
Calcium: 9.1 mg/dL (ref 8.9–10.3)
Chloride: 99 mmol/L (ref 98–111)
Creatinine, Ser: 0.94 mg/dL (ref 0.44–1.00)
GFR, Estimated: >60 mL/min (ref >60)
Glucose, Bld: 83 mg/dL (ref 70–99)
Potassium: 2.8 mmol/L — ABNORMAL LOW (ref 3.5–5.1)
Sodium: 138 mmol/L (ref 135–145)

## 2023-09-19 LAB — PHOSPHORUS: Phosphorus: 3.6 mg/dL (ref 2.5–4.6)

## 2023-09-19 LAB — MAGNESIUM: Magnesium: 1.4 mg/dL — ABNORMAL LOW (ref 1.7–2.4)

## 2023-09-19 LAB — TROPONIN I (HIGH SENSITIVITY)
Troponin I (High Sensitivity): 3 ng/L (ref <18)
Troponin I (High Sensitivity): 3 ng/L (ref <18)

## 2023-09-19 MED ORDER — ENSURE ENLIVE PO LIQD
237.0000 mL | Freq: Two times a day (BID) | ORAL | Status: DC
  Administered 2023-09-20 – 2023-09-27 (×4): 237 mL via ORAL

## 2023-09-19 MED ORDER — MAGNESIUM SULFATE 4 GM/100ML IV SOLN
4.0000 g | Freq: Once | INTRAVENOUS | Status: AC
  Administered 2023-09-20: 4 g via INTRAVENOUS
  Filled 2023-09-19: qty 100

## 2023-09-19 MED ORDER — POTASSIUM CHLORIDE 10 MEQ/100ML IV SOLN
10.0000 meq | INTRAVENOUS | Status: AC
  Administered 2023-09-19 (×2): 10 meq via INTRAVENOUS
  Filled 2023-09-19 (×2): qty 100

## 2023-09-19 MED ORDER — ONDANSETRON HCL 4 MG/2ML IJ SOLN
4.0000 mg | Freq: Three times a day (TID) | INTRAMUSCULAR | Status: DC | PRN
  Filled 2023-09-19: qty 2

## 2023-09-19 MED ORDER — POTASSIUM CHLORIDE CRYS ER 20 MEQ PO TBCR
40.0000 meq | EXTENDED_RELEASE_TABLET | Freq: Once | ORAL | Status: AC
  Administered 2023-09-19: 40 meq via ORAL
  Filled 2023-09-19: qty 2

## 2023-09-19 MED ORDER — SODIUM CHLORIDE 0.9 % IV BOLUS
1000.0000 mL | Freq: Once | INTRAVENOUS | Status: AC
  Administered 2023-09-19: 1000 mL via INTRAVENOUS

## 2023-09-19 MED ORDER — MAGNESIUM SULFATE 2 GM/50ML IV SOLN
2.0000 g | Freq: Once | INTRAVENOUS | Status: DC
  Filled 2023-09-19: qty 50

## 2023-09-19 MED ORDER — ACETAMINOPHEN 325 MG PO TABS
650.0000 mg | ORAL_TABLET | Freq: Four times a day (QID) | ORAL | Status: DC | PRN
  Administered 2023-09-26: 650 mg via ORAL
  Filled 2023-09-19 (×2): qty 2

## 2023-09-19 MED ORDER — ALBUTEROL SULFATE (2.5 MG/3ML) 0.083% IN NEBU: 2.5000 mg | INHALATION_SOLUTION | RESPIRATORY_TRACT | Status: DC | PRN

## 2023-09-19 NOTE — H&P (Signed)
History and Physical    [name] ZOX:[id number] DOB: 07-03-1981 DOA: 09/19/2023  Referring MD/NP/PA:   PCP: Dione Housekeeper, MD   Patient coming from:  The patient is coming from home.     Chief Complaint: Palpitation, constipation, abdominal pain  HPI: [name] is a 42 y.o. female with medical history significant of Gitetlman syndrome, hypokalemia, hypomagnesemia, RSD (reflex sympathetic dystrophy), POTS, SA node dysfunction status post pacemaker placement, DVT on Eliquis, and cerebral palsy, who present with palpitation, constipation and abdominal pain.  Patient has history of cerebral palsy, it is very difficulty to communicate with her.  Per her mom (I called her mother by phone), patient has been constipated recently.  Patient has chronic lower abdominal pain.  She complains of right lower abdominal pain.  No nausea, vomiting.  No fever or chills.  Patient was seen in ED yesterday.  She had CT scan per renal stone protocol which showed large volume of stool throughout the colon, otherwise negative.  Patient received enema with improvement of her constipation, but still has abdominal pain.  Per her mother, patient has palpitation and some chest discomfort, no cough or shortness breath.  No symptoms of UTI. Of note, her PCP started her on Keflex on 10/3 for right forearm wound infection  Data reviewed independently and ED Course: pt was found to have WBC 5.2, GFR> 60, potassium 2.8, magnesium 1.4, phosphorus 3.6, troponin level 3 --> 3.  Temperature normal, blood pressure 119/75, heart rate 55, 92, RR 21, oxygen sat 96% on room air.  Chest x-ray negative.  Patient is placed on telemetry bed for observation.   EKG: I have personally reviewed.  Paced rhythm, QTc 449   Review of Systems: Could not be reviewed accurately due to cerebral palsy   Allergy:  Allergies  Allergen Reactions   Duloxetine     Other reaction(s): Other (See Comments), Palpitations Other  reaction(s): Hallucinations, Hyperactive behavior (finding), Other (See Comments) Stressed out     Flecainide     Other reaction(s): Other (See Comments) Other reaction(s): Hypomagnesemia (disorder), Other (See Comments) Other Reaction: QUESTION HYPOMAGNESEMIA     Fluoxetine     Other reaction(s): Nausea And Vomiting, Other (See Comments) Other reaction(s): Other (See Comments) Other Reaction: INCREASED HR  causes irritability and irrational behavior "I go crazy"   Gabapentin     Goes crazy Other reaction(s): Other (See Comments) Other reaction(s): Hyperactive behavior (finding), Mental Status Changes (intolerance), Other (See Comments) Goes crazy Hallucinations   Baclofen     Other reaction(s): Other (See Comments), Other (See Comments) Weakness  Other reaction(s): Other (See Comments) Weakness and loose Weakness  Other reaction(s):  Weakness     Cephalosporins     Other reaction(s): Hives, Rash Received pre-op vancomycin and cefazolin 07/20/16. Developed urticarial rash on bilateral lower extremities + slight periorbital swelling. Treated with diphenhydramine. Received pre-op vancomycin and cefazolin 07/20/16. Developed urticarial rash on bilateral lower extremities + slight periorbital swelling. Treated with diphenhydramine. Received pre-op vancomycin and cefazolin 07/20/16. Developed urticarial rash on bilateral lower extremities + slight periorbital swelling. Treated with diphenhydramine.   Sulfasalazine     Other reaction(s): Nausea And Vomiting   Sulfa Antibiotics Nausea And Vomiting   Vancomycin     Other reaction(s): Hives Received pre-op vancomycin and cefazolin 07/20/16. Developed urticarial rash on bilateral lower extremities + slight periorbital swelling. Treated with diphenhydramine. Received pre-op vancomycin and cefazolin 07/20/16. Developed urticarial rash on bilateral lower extremities + slight periorbital swelling. Treated with  diphenhydramine. Received pre-op  vancomycin and cefazolin 07/20/16. Developed urticarial rash on bilateral lower extremities + slight periorbital swelling. Treated with diphenhydramine.   Cefazolin Rash    Tolerated ceftriaxone 2023 and cephalexin 2022    Past Medical History:  Diagnosis Date   Carpal tunnel syndrome    Cerebral palsy (HCC)    Chronic back pain    Gitelman syndrome    Hypokalemia    now resolved   Magnesium deficiency    Pacemaker    PONV (postoperative nausea and vomiting)    Postural orthostatic tachycardia syndrome    s/p pacemaker placement.   RSD (reflex sympathetic dystrophy)    right foot,    RSD (reflex sympathetic dystrophy)    SA node dysfunction (HCC) 2008    Past Surgical History:  Procedure Laterality Date   ABDOMINAL HYSTERECTOMY Left 2013   Dr. Tiburcio Pea   COLONOSCOPY WITH PROPOFOL N/A 12/11/2022   Procedure: COLONOSCOPY WITH PROPOFOL;  Surgeon: Toney Reil, MD;  Location: [facility] ENDOSCOPY;  Service: Gastroenterology;  Laterality: N/A;   COLONOSCOPY WITH PROPOFOL N/A 12/13/2022   Procedure: COLONOSCOPY WITH PROPOFOL;  Surgeon: Toney Reil, MD;  Location: [facility] ENDOSCOPY;  Service: Gastroenterology;  Laterality: N/A;   INSERT / REPLACE / REMOVE PACEMAKER     ORTHOPEDIC SURGERY     PACEMAKER INSERTION     PORTACATH PLACEMENT      Social History:  reports that she has never smoked. She has never used smokeless tobacco. She reports that she does not drink alcohol and does not use drugs.  Family History:  Family History  Problem Relation Age of Onset   Diabetes Mother    Hypertension Mother      Prior to Admission medications   Medication Sig Start Date End Date Taking? Authorizing Provider  ELIQUIS 5 MG TABS tablet Take 5 mg by mouth 2 (two) times daily. 08/12/22   [provider]  famotidine (PEPCID) 40 MG tablet Take 40 mg by mouth daily as needed for heartburn. 03/25/22   [provider]  lactulose (CHRONULAC) 10 GM/15ML solution Take 30 mLs (20  g total) by mouth daily as needed for mild constipation. 09/18/23   Irean Hong, MD  LINZESS 290 MCG CAPS capsule Take 290 mcg by mouth daily. 10/26/22   [provider]  magnesium (MAGTAB) 84 MG ( ) TBCR SR tablet Take 936 mg by mouth 3 (three) times daily. 11/30/22   [provider]  methocarbamol (ROBAXIN) 500 MG tablet Take 500-1,000 mg by mouth every 6 (six) hours as needed (for back spasms).    [provider]  methylphenidate (RITALIN) 20 MG tablet Take 20 mg by mouth 3 (three) times daily. Take 1 tablet (20 mg) TID at 0800, 1200 & 2100-2200    [provider]  metoCLOPramide (REGLAN) 10 MG tablet Take 1 tablet (10 mg total) by mouth every 8 (eight) hours as needed for up to 3 days for nausea or vomiting. 05/29/23 06/01/23  Gowens, Mariah L, PA-C  metoprolol succinate (TOPROL-XL) 25 MG 24 hr tablet Take 12.5 mg by mouth daily. 01/26/22   [provider]  montelukast (SINGULAIR) 10 MG tablet Take 10 mg by mouth daily as needed (allergies). 10/26/22   [provider]  pregabalin (LYRICA) 150 MG capsule Take 150 mg by mouth in the morning, at noon, and at bedtime.    [provider]  traMADol (ULTRAM) 50 MG tablet Take 50 mg by mouth every 6 (six) hours as needed for  moderate pain. 09/20/13   [provider]  TRULANCE 3 MG TABS Take 1 tablet by mouth daily. 11/16/22   [provider]    Physical Exam: Vitals:   09/19/23 1707 09/19/23 1710 09/19/23 2141 09/20/23 0013  BP:  119/75  98/82  Pulse:  (!) 55 92 84  Resp:  20  17  Temp:  98 F (36.7 C)    TempSrc:  Oral    SpO2:  96% 100% 100%  Weight: 43.1 kg     Height: 5\' 2"  (1.575 m)      General: Not in acute distress HEENT:       Eyes: PERRL, EOMI, no jaundice       ENT: No discharge from the ears and nose       Neck: No JVD, no bruit, no mass felt. Heme: No neck lymph node enlargement. Cardiac: S1/S2, RRR, No murmurs, No gallops or rubs. Respiratory: No  rales, wheezing, rhonchi or rubs. GI: Soft, nondistended, has tenderness in right lower abdomen, no rebound pain, no organomegaly, BS present. GU: No hematuria Ext: No pitting leg edema bilaterally. 1+DP/PT pulse bilaterally. Musculoskeletal: No joint deformities, No joint redness or warmth, no limitation of ROM in spin. Skin: No rashes.  Neuro: Alert, has cerebral palsy Psych: Patient is not psychotic, no suicidal or hemocidal ideation.  Labs on Admission: I have personally reviewed following labs and imaging studies  CBC: Recent Labs  Lab 09/17/23 2124 09/19/23 1718  WBC 5.4 5.2  HGB 11.4* 11.1*  HCT 33.6* 33.8*  MCV 79.8* 79.7*  PLT 118* 123*   Basic Metabolic Panel: Recent Labs  Lab 09/17/23 2124 09/19/23 1718 09/19/23 1943  NA 139 138  --   K 2.6* 2.8*  --   CL 98 99  --   CO2 29 25  --   GLUCOSE 124* 83  --   BUN 17 31*  --   CREATININE 0.85 0.94  --   CALCIUM 9.2 9.1  --   MG 1.4* 1.4*  --   PHOS  --   --  3.6   GFR: Estimated Creatinine Clearance: 53.6 mL/min (by C-G formula based on SCr of 0.94 mg/dL). Liver Function Tests: Recent Labs  Lab 09/17/23 2124  AST 33  ALT 31  ALKPHOS 68  BILITOT 0.7  PROT 6.9  ALBUMIN 4.0   Recent Labs  Lab 09/17/23 2124  LIPASE 25   No results for input(s): "AMMONIA" in the last 168 hours. Coagulation Profile: No results for input(s): "INR", "PROTIME" in the last 168 hours. Cardiac Enzymes: No results for input(s): "CKTOTAL", "CKMB", "CKMBINDEX", "TROPONINI" in the last 168 hours. BNP (last 3 results) No results for input(s): "PROBNP" in the last 8760 hours. HbA1C: No results for input(s): "HGBA1C" in the last 72 hours. CBG: No results for input(s): "GLUCAP" in the last 168 hours. Lipid Profile: No results for input(s): "CHOL", "HDL", "LDLCALC", "TRIG", "CHOLHDL", "LDLDIRECT" in the last 72 hours. Thyroid Function Tests: No results for input(s): "TSH", "T4TOTAL", "FREET4", "T3FREE", "THYROIDAB" in the last  72 hours. Anemia Panel: No results for input(s): "VITAMINB12", "FOLATE", "FERRITIN", "TIBC", "IRON", "RETICCTPCT" in the last 72 hours. Urine analysis:    Component Value Date/Time   COLORURINE YELLOW (A) 09/17/2023 2124   APPEARANCEUR CLEAR (A) 09/17/2023 2124   APPEARANCEUR Hazy 02/11/2014 1737   LABSPEC 1.012 09/17/2023 2124   LABSPEC 1.020 02/11/2014 1737   PHURINE 7.0 09/17/2023 2124   GLUCOSEU NEGATIVE 09/17/2023 2124   GLUCOSEU Negative 02/11/2014 1737  HGBUR SMALL (A) 09/17/2023 2124   BILIRUBINUR NEGATIVE 09/17/2023 2124   BILIRUBINUR Negative 02/11/2014 1737   KETONESUR NEGATIVE 09/17/2023 2124   PROTEINUR NEGATIVE 09/17/2023 2124   UROBILINOGEN 0.2 04/18/2014 1640   NITRITE NEGATIVE 09/17/2023 2124   LEUKOCYTESUR NEGATIVE 09/17/2023 2124   LEUKOCYTESUR Negative 02/11/2014 1737   Sepsis Labs: @LABRCNTIP (procalcitonin:4,lacticidven:4) )No results found for this or any previous visit (from the past 240 hour(s)).   Radiological Exams on Admission: DG Chest 1 View  Result Date: 09/19/2023 CLINICAL DATA:  Chest pain.  Heart palpitations. EXAM: CHEST  1 VIEW COMPARISON:  07/26/2023.  CT chest 04/30/2023. FINDINGS: Right IJ Port-A-Cath terminates in the high right atrium. Pacemaker lead tip is in the right atrium. Epicardial pacer wires project over the right heart. Heart size normal. Lungs are hyperinflated but clear. No pleural fluid. IMPRESSION: Hyperinflation without acute finding. Electronically Signed   By: Leanna Battles [name]   On: 09/19/2023 18:17      Assessment/Plan Principal Problem:   Electrolyte disturbance Active Problems:   Hypomagnesemia   Hypokalemia   Abdominal pain, chronic, right lower quadrant   Palpitation   Constipation   DVT (deep venous thrombosis) (HCC)   Essential hypertension   GERD without esophagitis   Thrombocytopenia (HCC)   Protein-calorie malnutrition, moderate (HCC)   Assessment and Plan:  Electrolyte disturbance including  hypokalemia and hypomagnesemia: Potassium 2.8, magnesium 1.4, phosphorus 3.6.  -Placed on telemetry bed for observation -Repleted potassium and magnesium -Consulted pharmacist for electrolytes disturbance  Abdominal pain, chronic, right lower quadrant: Etiology is not clear.  May be related to constipation.  Patient had lipase 25 yesterday. -Continue home as needed tramadol -As needed Tylenol  Palpitation: Likely due to electrolytes disturbance.  Patient has some chest discomfort, but troponin negative x 2. -Telemetry monitoring -Correction of electrolytes as above  Constipation -MiraLAX, senna code, Llinzess  DVT (deep venous thrombosis) (HCC) -DVT  Essential hypertension: Blood pressure 119/75.  Patient seems to be not taking metoprolol -IV hydralazine as needed  GERD without esophagitis -Protonix  Thrombocytopenia (HCC): This is chronic issue, platelet 123 -Follow-up CBC  Protein-calorie malnutrition, moderate (HCC): Body weight 43.1 kg, BMI 17.38 -Ensure nutrition supplement -Consult to nutrition      DVT ppx: on Eliquis  Code Status: Full code per her mother  Family Communication: Yes, patient's mother  by phone  Disposition Plan:  Anticipate discharge back to previous environment  Consults called:  none  Admission status and Level of care: Telemetry Medical:   for obs   Dispo: The patient is from: Home              Anticipated d/c is to: Home              Anticipated d/c date is: 1 day              Patient currently is not medically stable to d/c.    Severity of Illness:  The appropriate patient status for this patient is OBSERVATION. Observation status is judged to be reasonable and necessary in order to provide the required intensity of service to ensure the patient's safety. The patient's presenting symptoms, physical exam findings, and initial radiographic and laboratory data in the context of their medical condition is felt to place them at  decreased risk for further clinical deterioration. Furthermore, it is anticipated that the patient will be medically stable for discharge from the hospital within 2 midnights of admission.        Date of Service  09/20/2023    Lorretta Harp [facility]   If 7PM-7AM, please contact night-coverage [email or web address] 09/20/2023, 12:59 AM

## 2023-09-19 NOTE — Progress Notes (Signed)
PHARMACY CONSULT NOTE - FOLLOW UP  Pharmacy Consult for Electrolyte Monitoring and Replacement   Recent Labs: Potassium (mmol/L)  Date Value  09/19/2023 2.8 (L)  01/19/2015 3.4 (L)   Magnesium (mg/dL)  Date Value  16/09/9603 1.4 (L)  01/19/2015 1.0 (L)   Calcium (mg/dL)  Date Value  54/08/8118 9.1   Calcium, Total (mg/dL)  Date Value  14/78/2956 8.3 (L)   Albumin (g/dL)  Date Value  21/30/8657 4.0  06/14/2014 4.0   Phosphorus (mg/dL)  Date Value  84/69/6295 3.6   Sodium (mmol/L)  Date Value  09/19/2023 138  01/19/2015 142     Assessment: 10/7 @ 1718 :  K = 2.8,  Mag = 1.4   Goal of Therapy:  Electrolytes WNL   Plan:  KCl 40 mEq PO X 1 ordered + KCl 10 mEq IV X 2   - will order Magnesium Sulfate 4 gm IV X 1   - will recheck electrolytes on 10/08 with AM labs   Raegen Tarpley D ,PharmD Clinical Pharmacist 09/19/2023 11:14 PM

## 2023-09-19 NOTE — ED Triage Notes (Signed)
Patient arrived by [employer] from home. Patient seen here a couple days ago for constipation and received enema. Today c/o chest pains and palpitations. Also c/o right sided abdominal pain.   EMS administered 500 lactated ringers  EMS vitals: 110/64 b/p 80HR CBG 200s

## 2023-09-19 NOTE — ED Provider Notes (Signed)
[facility] Provider Note    None    (approximate)   History   Chest Pain   HPI  [name] is a 42 y.o. female who comes in for me past due to concerns for chest pains and palpitations and right-sided abdominal pain.  Patient has known history of chronic constipation and lower abdominal pain.  She got a history of cerebral palsy.  She reports feeling some chest discomfort and palpitations today which is new for her.  She reports having some good bowel movements with enema but still having some chronic abdominal pain.  No chest pain at this time.   Physical Exam   Triage Vital Signs: ED Triage Vitals  Encounter Vitals Group     BP 09/19/23 1710 119/75     Systolic BP Percentile --      Diastolic BP Percentile --      Pulse Rate 09/19/23 1710 (!) 55     Resp 09/19/23 1710 20     Temp 09/19/23 1710 98 F (36.7 C)     Temp Source 09/19/23 1710 Oral     SpO2 09/19/23 1710 96 %     Weight 09/19/23 1707 95 lb (43.1 kg)     Height 09/19/23 1707 5\' 2"  (1.575 m)     Head Circumference --      Peak Flow --      Pain Score 09/19/23 1707 6     Pain Loc --      Pain Education --      Exclude from Growth Chart --     Most recent vital signs: Vitals:   09/19/23 1710  BP: 119/75  Pulse: (!) 55  Resp: 20  Temp: 98 F (36.7 C)  SpO2: 96%     General: Awake, no distress.  CV:  Good peripheral perfusion.  Resp:  Normal effort.  Abd:  No distention.  Other:  Patient has contractures noted of extremities.  Abdomen is soft and nontender.  She has difficulty with her speech due to her baseline cerebral palsy   ED Results / Procedures / Treatments   Labs (all labs ordered are listed, but only abnormal results are displayed) Labs Reviewed  BASIC METABOLIC PANEL - Abnormal; Notable for the following components:      Result Value   Potassium 2.8 (*)    BUN 31 (*)    All other components within normal limits  CBC - Abnormal; Notable for the  following components:   Hemoglobin 11.1 (*)    HCT 33.8 (*)    MCV 79.7 (*)    Platelets 123 (*)    All other components within normal limits  TROPONIN I (HIGH SENSITIVITY)  TROPONIN I (HIGH SENSITIVITY)     EKG  My interpretation of EKG:  Normal sinus rate of 86 without any ST elevation or T wave inversions, paced  RADIOLOGY I have reviewed the xray personally and interpreted no pneumonia  PROCEDURES:  Critical Care performed: No  .1-3 Lead EKG Interpretation  Performed by: Concha Se, MD Authorized by: Concha Se, MD     Interpretation: normal     ECG rate:  90   ECG rate assessment: normal     Rhythm: sinus rhythm     Ectopy: none     Conduction: normal      MEDICATIONS ORDERED IN ED: Medications  potassium chloride SA (KLOR-CON M) CR tablet 40 mEq (has no administration in time range)  potassium chloride  10 mEq in 100 mL IVPB (has no administration in time range)  magnesium sulfate IVPB 2 g 50 mL (has no administration in time range)  sodium chloride 0.9 % bolus 1,000 mL (has no administration in time range)     IMPRESSION / MDM / ASSESSMENT AND PLAN / ED COURSE  I reviewed the triage vital signs and the nursing notes.   Patient's presentation is most consistent with acute presentation with potential threat to life or bodily function.   Patient comes in with concerns for palpitations and chest discomfort.  Workup was ordered to evaluate for pneumonia, ACS, electrolyte abnormalities.  Still having some chronic abdominal pain.  Troponins are negative x 2.  BMP shows potassium of 2.8 improving from 2 days ago at 2.6.    I reviewed the note from 10/5 patient had a CT scan which showed large volumes of stool patient had noted hypokalemia and gave some IV medications.  Given patient's representation less than 24 hours since she was just seen now with palpitations with hypokalemia I feel like patient should be admitted for cardiac monitoring and repletion  of electrolytes and continue treatment for her constipation  The patient is on the cardiac monitor to evaluate for evidence of arrhythmia and/or significant heart rate changes.      FINAL CLINICAL IMPRESSION(S) / ED DIAGNOSES   Final diagnoses:  Hypokalemia  Hypomagnesemia  Palpitations  Constipation, unspecified constipation type     Rx / DC Orders   ED Discharge Orders     None        Note:  This document was prepared using Dragon voice recognition software and may include unintentional dictation errors.   Concha Se, MD 09/19/23 2214

## 2023-09-20 ENCOUNTER — Observation Stay: Payer: Medicare HMO

## 2023-09-20 ENCOUNTER — Encounter: Payer: Self-pay | Admitting: Internal Medicine

## 2023-09-20 DIAGNOSIS — E878 Other disorders of electrolyte and fluid balance, not elsewhere classified: Secondary | ICD-10-CM | POA: Diagnosis not present

## 2023-09-20 LAB — MAGNESIUM: Magnesium: 1.9 mg/dL (ref 1.7–2.4)

## 2023-09-20 LAB — CBC
HCT: 30.4 % — ABNORMAL LOW (ref 36.0–46.0)
Hemoglobin: 10.1 g/dL — ABNORMAL LOW (ref 12.0–15.0)
MCH: 26.3 pg (ref 26.0–34.0)
MCHC: 33.2 g/dL (ref 30.0–36.0)
MCV: 79.2 fL — ABNORMAL LOW (ref 80.0–100.0)
Platelets: 117 10*3/uL — ABNORMAL LOW (ref 150–400)
RBC: 3.84 MIL/uL — ABNORMAL LOW (ref 3.87–5.11)
RDW: 13.1 % (ref 11.5–15.5)
WBC: 4 10*3/uL (ref 4.0–10.5)
nRBC: 0 % (ref 0.0–0.2)

## 2023-09-20 LAB — BASIC METABOLIC PANEL
Anion gap: 11 (ref 5–15)
BUN: 22 mg/dL — ABNORMAL HIGH (ref 6–20)
CO2: 24 mmol/L (ref 22–32)
Calcium: 8.3 mg/dL — ABNORMAL LOW (ref 8.9–10.3)
Chloride: 104 mmol/L (ref 98–111)
Creatinine, Ser: 0.75 mg/dL (ref 0.44–1.00)
GFR, Estimated: >60 mL/min (ref >60)
Glucose, Bld: 74 mg/dL (ref 70–99)
Potassium: 3.8 mmol/L (ref 3.5–5.1)
Sodium: 139 mmol/L (ref 135–145)

## 2023-09-20 LAB — HIV ANTIBODY (ROUTINE TESTING W REFLEX): HIV Screen 4th Generation wRfx: NONREACTIVE

## 2023-09-20 MED ORDER — VITAMIN B-12 1000 MCG PO TABS
1000.0000 ug | ORAL_TABLET | Freq: Every day | ORAL | Status: DC
  Administered 2023-09-20 – 2023-09-28 (×9): 1000 ug via ORAL
  Filled 2023-09-20 (×9): qty 1

## 2023-09-20 MED ORDER — METHYLPHENIDATE HCL 10 MG PO TABS
20.0000 mg | ORAL_TABLET | Freq: Three times a day (TID) | ORAL | Status: DC
  Administered 2023-09-20 – 2023-09-28 (×25): 20 mg via ORAL
  Filled 2023-09-20 (×2): qty 2
  Filled 2023-09-20: qty 1
  Filled 2023-09-20 (×24): qty 2

## 2023-09-20 MED ORDER — LACTULOSE 10 GM/15ML PO SOLN
20.0000 g | Freq: Every day | ORAL | Status: DC | PRN
  Administered 2023-09-21: 20 g via ORAL
  Filled 2023-09-20: qty 30

## 2023-09-20 MED ORDER — TRAMADOL HCL 50 MG PO TABS
50.0000 mg | ORAL_TABLET | Freq: Four times a day (QID) | ORAL | Status: DC | PRN
  Filled 2023-09-20 (×2): qty 1

## 2023-09-20 MED ORDER — MAGNESIUM CHLORIDE 64 MG PO TBEC
1.0000 | DELAYED_RELEASE_TABLET | Freq: Two times a day (BID) | ORAL | Status: DC
  Administered 2023-09-20 – 2023-09-22 (×2): 64 mg via ORAL
  Filled 2023-09-20 (×9): qty 1

## 2023-09-20 MED ORDER — MONTELUKAST SODIUM 10 MG PO TABS: 10.0000 mg | ORAL_TABLET | Freq: Every day | ORAL | Status: DC | PRN

## 2023-09-20 MED ORDER — VITAMIN D 25 MCG (1000 UNIT) PO TABS
2000.0000 [IU] | ORAL_TABLET | Freq: Every day | ORAL | Status: DC
  Administered 2023-09-21: 2000 [IU] via ORAL
  Filled 2023-09-20 (×2): qty 2

## 2023-09-20 MED ORDER — APIXABAN 5 MG PO TABS
5.0000 mg | ORAL_TABLET | Freq: Two times a day (BID) | ORAL | Status: DC
  Administered 2023-09-20 – 2023-09-28 (×18): 5 mg via ORAL
  Filled 2023-09-20 (×18): qty 1

## 2023-09-20 MED ORDER — LINACLOTIDE 145 MCG PO CAPS
290.0000 ug | ORAL_CAPSULE | Freq: Every day | ORAL | Status: DC
  Administered 2023-09-20 – 2023-09-28 (×8): 290 ug via ORAL
  Filled 2023-09-20 (×5): qty 2
  Filled 2023-09-20: qty 1
  Filled 2023-09-20 (×3): qty 2
  Filled 2023-09-20: qty 1

## 2023-09-20 MED ORDER — SUCRALFATE 1 G PO TABS
1.0000 g | ORAL_TABLET | Freq: Four times a day (QID) | ORAL | Status: DC
  Administered 2023-09-20 – 2023-09-28 (×33): 1 g via ORAL
  Filled 2023-09-20 (×33): qty 1

## 2023-09-20 MED ORDER — PREGABALIN 75 MG PO CAPS
150.0000 mg | ORAL_CAPSULE | Freq: Two times a day (BID) | ORAL | Status: DC
  Administered 2023-09-20 – 2023-09-28 (×15): 150 mg via ORAL
  Filled 2023-09-20 (×16): qty 2

## 2023-09-20 MED ORDER — FAMOTIDINE 20 MG PO TABS: 40.0000 mg | ORAL_TABLET | Freq: Every day | ORAL | Status: DC | PRN

## 2023-09-20 MED ORDER — POLYETHYLENE GLYCOL 3350 17 G PO PACK
17.0000 g | PACK | Freq: Every day | ORAL | Status: DC
  Administered 2023-09-20: 17 g via ORAL
  Filled 2023-09-20: qty 1

## 2023-09-20 MED ORDER — CEPHALEXIN 250 MG PO CAPS
250.0000 mg | ORAL_CAPSULE | Freq: Three times a day (TID) | ORAL | Status: DC
  Administered 2023-09-20: 250 mg via ORAL
  Filled 2023-09-20 (×6): qty 1

## 2023-09-20 MED ORDER — PANTOPRAZOLE SODIUM 40 MG PO TBEC
40.0000 mg | DELAYED_RELEASE_TABLET | Freq: Every day | ORAL | Status: DC
  Administered 2023-09-20 – 2023-09-27 (×7): 40 mg via ORAL
  Filled 2023-09-20 (×8): qty 1

## 2023-09-20 MED ORDER — METHOCARBAMOL 500 MG PO TABS
500.0000 mg | ORAL_TABLET | Freq: Four times a day (QID) | ORAL | Status: DC | PRN
  Filled 2023-09-20: qty 1

## 2023-09-20 MED ORDER — ADULT MULTIVITAMIN W/MINERALS CH
1.0000 | ORAL_TABLET | Freq: Every day | ORAL | Status: DC
  Administered 2023-09-21 – 2023-09-28 (×8): 1 via ORAL
  Filled 2023-09-20 (×8): qty 1

## 2023-09-20 MED ORDER — SENNOSIDES-DOCUSATE SODIUM 8.6-50 MG PO TABS
1.0000 | ORAL_TABLET | Freq: Two times a day (BID) | ORAL | Status: DC
  Administered 2023-09-20 (×2): 1 via ORAL
  Filled 2023-09-20 (×3): qty 1

## 2023-09-20 MED ORDER — PROPRANOLOL HCL 20 MG PO TABS
10.0000 mg | ORAL_TABLET | Freq: Two times a day (BID) | ORAL | Status: DC
  Filled 2023-09-20: qty 1

## 2023-09-20 NOTE — Progress Notes (Signed)
PROGRESS NOTE    [name]  MWU:[id number] DOB: [birth year] DOA: 09/19/2023 PCP: Dione Housekeeper, MD    Brief Narrative:  42 y.o. female with medical history significant of Gitetlman syndrome, hypokalemia, hypomagnesemia, RSD (reflex sympathetic dystrophy), POTS, SA node dysfunction status post pacemaker placement, DVT on Eliquis, and cerebral palsy, who present with palpitation, constipation and abdominal pain.   Patient has history of cerebral palsy, it is very difficulty to communicate with her.  Per her mom (I called her mother by phone), patient has been constipated recently.  Patient has chronic lower abdominal pain.  She complains of right lower abdominal pain.  No nausea, vomiting.  No fever or chills.  Patient was seen in ED yesterday.  She had CT scan per renal stone protocol which showed large volume of stool throughout the colon, otherwise negative.  Patient received enema with improvement of her constipation, but still has abdominal pain.  Per her mother, patient has palpitation and some chest discomfort, no cough or shortness breath.  No symptoms of UTI. Of note, her PCP started her on Keflex on 10/3 for right forearm wound infection  10/8: Patient underwent enema with good result on 10/7.  Following day still endorsing abdominal pain   Assessment & Plan:   Principal Problem:   Electrolyte disturbance Active Problems:   Hypomagnesemia   Hypokalemia   Abdominal pain, chronic, right lower quadrant   Palpitation   Constipation   DVT (deep venous thrombosis) (HCC)   Essential hypertension   GERD without esophagitis   Thrombocytopenia (HCC)   Protein-calorie malnutrition, moderate (HCC)  Electrolyte disturbance including hypokalemia and hypomagnesemia: Potassium 2.8, magnesium 1.4, phosphorus 3.6.  On admission.  Electrolytes have been corrected. Plan: Monitor electrolytes Repeat labs in a.m. Consulted pharmacist for electrolyte replacement   Abdominal pain,  chronic, right lower quadrant:  Etiology is not clear.  May be related to constipation.   Patient had lipase 25 yesterday. Constipation resolved just evidenced by repeat KUB Plan: Home tramadol Home Lyrica Reassess pain in a.m. and consider discharge if improved  Palpitation:  Likely due to electrolytes disturbance.  Patient has some chest discomfort, but troponin negative x 2.    Constipation -MiraLAX, senna code, Llinzess -Defer repeat enema   DVT (deep venous thrombosis) (HCC) -Eliquis   Essential hypertension:  Blood pressure 119/75.  Patient seems to be not taking metoprolol -IV hydralazine as needed   GERD without esophagitis -Protonix   Thrombocytopenia (HCC): This is chronic issue, platelet 123 -Follow-up CBC   Protein-calorie malnutrition, moderate (HCC): Body weight 43.1 kg, BMI 17.38 -Ensure nutrition supplement -Consult to nutrition   DVT prophylaxis: Eliquis Code Status: Full Family Communication: Mother Babette Relic [phone] on 10/8 Disposition Plan: Status is: Observation The patient will require care spanning > 2 midnights and should be moved to inpatient because: Constipation, abdominal pain, electrolyte deficiency.  Anticipate discharge 10/9.   Level of care: Telemetry Medical  Consultants:  None  Procedures:  None  Antimicrobials: None   Subjective: Seen and examined.  History limited by speech difficulties.  Patient continues to endorse abdominal pain.  Objective: Vitals:   09/20/23 0928 09/20/23 1100 09/20/23 1245 09/20/23 1444  BP:  102/70 114/73   Pulse:  85 91   Resp:  (!) 33    Temp: 97.8 F (36.6 C) 98.2 F (36.8 C) 99 F (37.2 C)   TempSrc: Oral Oral Oral   SpO2:  96% 99%   Weight:    41.2 kg  Height:  Intake/Output Summary (Last 24 hours) at 09/20/2023 1530 Last data filed at 09/20/2023 0351 Gross per 24 hour  Intake 100 ml  Output --  Net 100 ml   Filed Weights   09/19/23 1707 09/20/23 1444  Weight: 43.1  kg 41.2 kg    Examination:  General exam: NAD.  Pleasant.  Appears frail and chronically ill Respiratory system: Clear to auscultation. Respiratory effort normal. Cardiovascular system: S1-S2, RRR, no murmurs, no pedal edema Gastrointestinal system: Soft, nondistended, mild TTP, normal bowel sounds Central nervous system: Alert.  Oriented X 2. Extremities: Decreased power bilateral lower extremities Skin: No rashes, lesions or ulcers Psychiatry: Judgement and insight appear normal. Mood & affect appropriate.     Data Reviewed: I have personally reviewed following labs and imaging studies  CBC: Recent Labs  Lab 09/17/23 2124 09/19/23 1718 09/20/23 0532  WBC 5.4 5.2 4.0  HGB 11.4* 11.1* 10.1*  HCT 33.6* 33.8* 30.4*  MCV 79.8* 79.7* 79.2*  PLT 118* 123* 117*   Basic Metabolic Panel: Recent Labs  Lab 09/17/23 2124 09/19/23 1718 09/19/23 1943 09/20/23 0532  NA 139 138  --  139  K 2.6* 2.8*  --  3.8  CL 98 99  --  104  CO2 29 25  --  24  GLUCOSE 124* 83  --  74  BUN 17 31*  --  22*  CREATININE 0.85 0.94  --  0.75  CALCIUM 9.2 9.1  --  8.3*  MG 1.4* 1.4*  --  1.9  PHOS  --   --  3.6  --    GFR: Estimated Creatinine Clearance: 60.2 mL/min (by C-G formula based on SCr of 0.75 mg/dL). Liver Function Tests: Recent Labs  Lab 09/17/23 2124  AST 33  ALT 31  ALKPHOS 68  BILITOT 0.7  PROT 6.9  ALBUMIN 4.0   Recent Labs  Lab 09/17/23 2124  LIPASE 25   No results for input(s): "AMMONIA" in the last 168 hours. Coagulation Profile: No results for input(s): "INR", "PROTIME" in the last 168 hours. Cardiac Enzymes: No results for input(s): "CKTOTAL", "CKMB", "CKMBINDEX", "TROPONINI" in the last 168 hours. BNP (last 3 results) No results for input(s): "PROBNP" in the last 8760 hours. HbA1C: No results for input(s): "HGBA1C" in the last 72 hours. CBG: No results for input(s): "GLUCAP" in the last 168 hours. Lipid Profile: No results for input(s): "CHOL", "HDL",  "LDLCALC", "TRIG", "CHOLHDL", "LDLDIRECT" in the last 72 hours. Thyroid Function Tests: No results for input(s): "TSH", "T4TOTAL", "FREET4", "T3FREE", "THYROIDAB" in the last 72 hours. Anemia Panel: No results for input(s): "VITAMINB12", "FOLATE", "FERRITIN", "TIBC", "IRON", "RETICCTPCT" in the last 72 hours. Sepsis Labs: No results for input(s): "PROCALCITON", "LATICACIDVEN" in the last 168 hours.  No results found for this or any previous visit (from the past 240 hour(s)).       Radiology Studies: DG Abd 1 View  Result Date: 09/20/2023 CLINICAL DATA:  [id number] Constipation [id number] EXAM: ABDOMEN - 1 VIEW COMPARISON:  08/20/2023. FINDINGS: The bowel gas pattern is non-obstructive. There is a loop of splenic flexure of colon exhibiting small amount of fecal material. Otherwise there is paucity of bowel gas. No evidence of pneumoperitoneum, within the limitations of a supine film. No acute osseous abnormalities. The soft tissues are within normal limits. Surgical changes, devices, tubes and lines: Again seen is a battery pack overlying the right upper quadrant with the lead extending superiorly out of the field of view. IMPRESSION: 1. Non-obstructive bowel gas pattern. Electronically Signed  By: Jules Schick [name]   On: 09/20/2023 09:17   DG Chest 1 View  Result Date: 09/19/2023 CLINICAL DATA:  Chest pain.  Heart palpitations. EXAM: CHEST  1 VIEW COMPARISON:  07/26/2023.  CT chest 04/30/2023. FINDINGS: Right IJ Port-A-Cath terminates in the high right atrium. Pacemaker lead tip is in the right atrium. Epicardial pacer wires project over the right heart. Heart size normal. Lungs are hyperinflated but clear. No pleural fluid. IMPRESSION: Hyperinflation without acute finding. Electronically Signed   By: Leanna Battles [name]   On: 09/19/2023 18:17        Scheduled Meds:  apixaban  5 mg Oral BID   cephALEXin  250 mg Oral TID   cholecalciferol  2,000 Units Oral Daily   cyanocobalamin  1,000 mcg  Oral Daily   feeding supplement  237 mL Oral BID BM   linaclotide  290 mcg Oral Daily   magnesium chloride  1 tablet Oral BID   methylphenidate  20 mg Oral TID   [START ON 09/21/2023] multivitamin with minerals  1 tablet Oral Daily   pantoprazole  40 mg Oral Daily   polyethylene glycol  17 g Oral Daily   pregabalin  150 mg Oral BID   propranolol  10 mg Oral BID   senna-docusate  1 tablet Oral BID   sucralfate  1 g Oral QID   Continuous Infusions:   LOS: 0 days     Tresa Moore, MD [facility]   If 7PM-7AM, please contact night-coverage  09/20/2023, 3:30 PM

## 2023-09-20 NOTE — Progress Notes (Signed)
PT Cancellation Note  Patient Details Name: [name] MRN: [id number] DOB: 1981/04/07   Cancelled Treatment:    Reason Eval/Treat Not Completed: PT screened, no needs identified, will sign off (Order received, chart reviewed- appears pt uses power chair for mobility needs. No indication for acute PT evaluation. Per chart, pt was being set up for HHPT by outpatient provider after 10/2 visit. Will need new order at DC. PT signing off.)  3:59 PM, 09/20/23 Rosamaria Lints, PT, DPT Physical Therapist - [facility]  [phone] (ASCOM)    Zair Borawski C 09/20/2023, 3:59 PM

## 2023-09-20 NOTE — Progress Notes (Signed)
PHARMACY CONSULT NOTE - FOLLOW UP  Pharmacy Consult for Electrolyte Monitoring and Replacement   Recent Labs: Potassium (mmol/L)  Date Value  09/20/2023 3.8  01/19/2015 3.4 (L)   Magnesium (mg/dL)  Date Value  45/40/9811 1.9  01/19/2015 1.0 (L)   Calcium (mg/dL)  Date Value  91/47/8295 8.3 (L)   Calcium, Total (mg/dL)  Date Value  62/13/0865 8.3 (L)   Albumin (g/dL)  Date Value  78/46/9629 4.0  06/14/2014 4.0   Phosphorus (mg/dL)  Date Value  52/84/1324 3.6   Sodium (mmol/L)  Date Value  09/20/2023 139  01/19/2015 142     Assessment: 42 yo female presented by EMS due to palpatation, chest pain and abdominal pain.  In ED patient found to have hypokalemia and hypomagnesemia.  Goal of Therapy:  Electrolytes WNL   Plan:  No replacement indicated at this time Recheck electrolytes with AM labs  Barrie Folk ,PharmD Clinical Pharmacist 09/20/2023 7:04 AM

## 2023-09-20 NOTE — Progress Notes (Signed)
Initial Nutrition Assessment  DOCUMENTATION CODES:   Non-severe (moderate) malnutrition in context of chronic illness  INTERVENTION:   Ensure Enlive po BID, each supplement provides 350 kcal and 20 grams of protein.  Magic cup TID with meals, each supplement provides 290 kcal and 9 grams of protein  MVI po daily   Pt at high refeed risk; recommend monitor potassium, magnesium and phosphorus labs daily until stable  Dysphagia 3 diet   Daily weights   Check vitamin D level  NUTRITION DIAGNOSIS:   Moderate Malnutrition related to chronic illness as evidenced by mild fat depletion, mild muscle depletion.  GOAL:   Patient will meet greater than or equal to 90% of their needs  MONITOR:   PO intake, Supplement acceptance, Labs, Weight trends, I & O's, Skin  REASON FOR ASSESSMENT:   Consult Assessment of nutrition requirement/status  ASSESSMENT:   42 y/o female with h/o HTN, cerebral palsy, GERD, thrombocytopenia, spondylolysis, IBS, POTS, SA node dysfunction s/p pacemaker, RSD, Gitelman syndrome, chronic constipation and DVT who is admitted with electrolyte disturbance and abdominal pain.  Met with pt in room today. Pt reports good appetite and oral intake at baseline but reports poor oral intake for 5 days pta r/t abdominal pain. Pt reports that she has also been having leg weakness. Pt reports chronic constipation and reports no BM for several days. RD discussed with pt the importance of adequate nutrition needed to preserve lean muscle. Pt is willing to drink strawberry Ensure. Pt is requesting for her food to be cut up; RD will request a dysphagia 3 diet. RD will add supplements and MVI to help pt meet her estimated needs. Pt is likely at refeed risk. Per chart, pt appears weight stable at baseline.   Pt with h/o severe vitamin D deficiency; will check levels.   Medications reviewed and include: cephalexin, D3, Mg chloride, protonix, miralax, senokot  Labs reviewed: K  3.8 wnl, Mg 1.9 wnl P 3.6 wnl- 10/7 Vitamin D 4.6(L)- 2021 Hgb 10.1(L), Hct 30.4(L)  NUTRITION - FOCUSED PHYSICAL EXAM:  Flowsheet Row Most Recent Value  Orbital Region No depletion  Upper Arm Region Moderate depletion  Thoracic and Lumbar Region Mild depletion  Buccal Region No depletion  Temple Region Mild depletion  Clavicle Bone Region Mild depletion  Clavicle and Acromion Bone Region Mild depletion  Scapular Bone Region No depletion  Dorsal Hand Mild depletion  Patellar Region Severe depletion  Anterior Thigh Region Severe depletion  Posterior Calf Region Severe depletion  Edema (RD Assessment) None  Hair Reviewed  Eyes Reviewed  Mouth Reviewed  Skin Reviewed  Nails Reviewed   Diet Order:   Diet Order             DIET DYS 3 Room service appropriate? Yes with Assist; Fluid consistency: Thin  Diet effective now                  EDUCATION NEEDS:   Education needs have been addressed  Skin:  Skin Assessment: Reviewed RN Assessment  Last BM:  10/5  Height:   Ht Readings from Last 1 Encounters:  09/19/23 5\' 2"  (1.575 m)    Weight:   Wt Readings from Last 1 Encounters:  09/20/23 41.2 kg    Ideal Body Weight:  50 kg  BMI:  Body mass index is 16.61 kg/m.  Estimated Nutritional Needs:   Kcal:  1300-1500kcal/day  Protein:  65-75g/day  Fluid:  1.3-1.5L/day  Betsey Holiday MS, RD, LDN Please refer to [facility]  for RD and/or RD on-call/weekend/after hours pager

## 2023-09-20 NOTE — ED Notes (Signed)
Patient states she usually crawls on the floor and uses a power chair but lately her legs are hurting and if she falls she cannot get back up on her own.    09/20/23 0900  ADL Screening (condition at time of admission)  Independently performs ADLs? No  Does the patient have a NEW difficulty with bathing/dressing/toileting/self-feeding that is expected to last >3 days? No  Does the patient have a NEW difficulty with getting in/out of bed, walking, or climbing stairs that is expected to last >3 days? Yes (Initiates electronic notice to provider for possible PT consult)  Does the patient have a NEW difficulty with communication that is expected to last >3 days? No  Is the patient deaf or have difficulty hearing? N  Does the patient have difficulty seeing, even when wearing glasses/contacts? N  Does the patient have difficulty concentrating, remembering, or making decisions? N

## 2023-09-20 NOTE — ED Notes (Signed)
Assisted pt with bedpan

## 2023-09-21 DIAGNOSIS — E878 Other disorders of electrolyte and fluid balance, not elsewhere classified: Secondary | ICD-10-CM | POA: Diagnosis not present

## 2023-09-21 LAB — VITAMIN D 25 HYDROXY (VIT D DEFICIENCY, FRACTURES): Vit D, 25-Hydroxy: 23.29 ng/mL — ABNORMAL LOW (ref 30–100)

## 2023-09-21 LAB — URINALYSIS, COMPLETE (UACMP) WITH MICROSCOPIC
Bilirubin Urine: NEGATIVE
Glucose, UA: NEGATIVE mg/dL
Hgb urine dipstick: NEGATIVE
Ketones, ur: 5 mg/dL — AB
Leukocytes,Ua: NEGATIVE
Nitrite: NEGATIVE
Protein, ur: NEGATIVE mg/dL
Specific Gravity, Urine: 1.011 (ref 1.005–1.030)
pH: 5 (ref 5.0–8.0)

## 2023-09-21 LAB — BASIC METABOLIC PANEL
Anion gap: 12 (ref 5–15)
BUN: 13 mg/dL (ref 6–20)
CO2: 26 mmol/L (ref 22–32)
Calcium: 9.1 mg/dL (ref 8.9–10.3)
Chloride: 103 mmol/L (ref 98–111)
Creatinine, Ser: 0.64 mg/dL (ref 0.44–1.00)
GFR, Estimated: >60 mL/min (ref >60)
Glucose, Bld: 101 mg/dL — ABNORMAL HIGH (ref 70–99)
Potassium: 3.4 mmol/L — ABNORMAL LOW (ref 3.5–5.1)
Sodium: 141 mmol/L (ref 135–145)

## 2023-09-21 LAB — PHOSPHORUS: Phosphorus: 3 mg/dL (ref 2.5–4.6)

## 2023-09-21 LAB — MAGNESIUM: Magnesium: 1.2 mg/dL — ABNORMAL LOW (ref 1.7–2.4)

## 2023-09-21 LAB — FOLATE: Folate: 32 ng/mL (ref >5.9)

## 2023-09-21 LAB — VITAMIN B12: Vitamin B-12: 621 pg/mL (ref 180–914)

## 2023-09-21 LAB — IRON AND TIBC
Iron: 41 ug/dL (ref 28–170)
Saturation Ratios: 12 % (ref 10.4–31.8)
TIBC: 346 ug/dL (ref 250–450)
UIBC: 305 ug/dL

## 2023-09-21 MED ORDER — MAGNESIUM SULFATE 4 GM/100ML IV SOLN
4.0000 g | Freq: Once | INTRAVENOUS | Status: AC
  Administered 2023-09-21: 4 g via INTRAVENOUS
  Filled 2023-09-21: qty 100

## 2023-09-21 MED ORDER — POTASSIUM CHLORIDE CRYS ER 20 MEQ PO TBCR
40.0000 meq | EXTENDED_RELEASE_TABLET | Freq: Once | ORAL | Status: AC
  Administered 2023-09-21: 40 meq via ORAL
  Filled 2023-09-21: qty 2

## 2023-09-21 MED ORDER — POLYETHYLENE GLYCOL 3350 17 G PO PACK
17.0000 g | PACK | Freq: Two times a day (BID) | ORAL | Status: DC
  Administered 2023-09-21 – 2023-09-28 (×7): 17 g via ORAL
  Filled 2023-09-21 (×10): qty 1

## 2023-09-21 MED ORDER — BISACODYL 5 MG PO TBEC
10.0000 mg | DELAYED_RELEASE_TABLET | Freq: Every day | ORAL | Status: DC
  Administered 2023-09-21 – 2023-09-23 (×2): 10 mg via ORAL
  Filled 2023-09-21 (×4): qty 2

## 2023-09-21 MED ORDER — BISACODYL 10 MG RE SUPP: 10.0000 mg | Freq: Every day | RECTAL | Status: DC | PRN

## 2023-09-21 MED ORDER — DILTIAZEM HCL 25 MG/5ML IV SOLN: 5.0000 mg | Freq: Four times a day (QID) | INTRAVENOUS | Status: DC | PRN

## 2023-09-21 MED ORDER — DOXYCYCLINE HYCLATE 100 MG PO TABS
100.0000 mg | ORAL_TABLET | Freq: Two times a day (BID) | ORAL | Status: AC
  Administered 2023-09-21 – 2023-09-25 (×10): 100 mg via ORAL
  Filled 2023-09-21 (×10): qty 1

## 2023-09-21 MED ORDER — VITAMIN D (ERGOCALCIFEROL) 1.25 MG (50000 UNIT) PO CAPS
50000.0000 [IU] | ORAL_CAPSULE | ORAL | Status: DC
  Filled 2023-09-21: qty 1

## 2023-09-21 NOTE — Progress Notes (Signed)
PHARMACY CONSULT NOTE - FOLLOW UP  Pharmacy Consult for Electrolyte Monitoring and Replacement   Recent Labs: Potassium (mmol/L)  Date Value  09/21/2023 3.4 (L)  01/19/2015 3.4 (L)   Magnesium (mg/dL)  Date Value  16/09/9603 1.2 (L)  01/19/2015 1.0 (L)   Calcium (mg/dL)  Date Value  54/08/8118 9.1   Calcium, Total (mg/dL)  Date Value  14/78/2956 8.3 (L)   Albumin (g/dL)  Date Value  21/30/8657 4.0  06/14/2014 4.0   Phosphorus (mg/dL)  Date Value  84/69/6295 3.0   Sodium (mmol/L)  Date Value  09/21/2023 141  01/19/2015 142     Assessment: 42 yo female presented by EMS due to palpatation, chest pain and abdominal pain.  In ED patient found to have hypokalemia and hypomagnesemia.  Goal of Therapy:  Electrolytes WNL   Plan:  Mg = 1.2, give MagSulf 4 grams IV x 1 K = 3.4, give Kcl 40 mEq po x 1 Recheck electrolytes with AM labs  Barrie Folk ,PharmD Clinical Pharmacist 09/21/2023 7:43 AM

## 2023-09-21 NOTE — Plan of Care (Signed)

## 2023-09-21 NOTE — TOC Progression Note (Signed)
Transition of Care [facility]) - Progression Note    Patient Details  Name: [name] MRN: [id number] Date of Birth: [birth year]  Transition of Care [facility]) CM/SW Contact  Marlowe Sax, RN Phone Number: 09/21/2023, 10:27 AM  Clinical Narrative:    Spoke with Tammy the patient has an electric wheelchair at home, She will need EMS home [facility] was open but closed on 09/30 and they will reopen her with RN, PT, OT Reviewed the [facility]   Expected Discharge Plan: Home w Home Health Services Barriers to Discharge: No Barriers Identified  Expected Discharge Plan and Services In-house Referral: NA Discharge Planning Services: CM Consult   Living arrangements for the past 2 months: Single Family Home                 DME Arranged: N/A         HH Arranged: PT, OT, RN HH Agency: [facility] Date [facility] Agency Contacted: 09/21/23 Time HH Agency Contacted: 1025 Representative spoke with at [facility] Agency: [place]   Social Determinants of Health (SDOH) Interventions SDOH Screenings   Food Insecurity: No Food Insecurity (09/20/2023)  Housing: Low Risk  (09/20/2023)  Transportation Needs: No Transportation Needs (09/20/2023)  Utilities: Not At Risk (09/20/2023)  Financial Resource Strain: Low Risk  (07/12/2023)   Received from [facility]  Social Connections: Unknown (08/02/2022)   Received from [facility]  Stress: Stress Concern Present (07/20/2022)   [facility], [facility]  Tobacco Use: Low Risk  (09/20/2023)    Readmission Risk Interventions     No data to display

## 2023-09-21 NOTE — Progress Notes (Signed)
[facility] Progress Note  Patient: [name]    OZH:[id number]  DOA: 09/19/2023     Date of Service: the patient was seen and examined on 09/21/2023  Chief Complaint  Patient presents with   Chest Pain   Brief hospital course: 42 y.o. female with medical history significant of Gitetlman syndrome, hypokalemia, hypomagnesemia, RSD (reflex sympathetic dystrophy), POTS, SA node dysfunction status post pacemaker placement, DVT on Eliquis, and cerebral palsy, who present with palpitation, constipation and abdominal pain.   Patient has history of cerebral palsy, it is very difficulty to communicate with her.  Per her mom (I called her mother by phone), patient has been constipated recently.  Patient has chronic lower abdominal pain.  She complains of right lower abdominal pain.  No nausea, vomiting.  No fever or chills.  Patient was seen in ED yesterday.  She had CT scan per renal stone protocol which showed large volume of stool throughout the colon, otherwise negative.  Patient received enema with improvement of her constipation, but still has abdominal pain.  Per her mother, patient has palpitation and some chest discomfort, no cough or shortness breath.  No symptoms of UTI. Of note, her PCP started her on Keflex on 10/3 for right forearm wound infection   10/8: Patient underwent enema with good result on 10/7.  Following day still endorsing abdominal pain    Assessment and Plan: Electrolyte disturbance including hypokalemia and hypomagnesemia: Potassium 2.8, magnesium 1.4, phosphorus 3.6.  On admission.  Electrolytes have been corrected. Plan: Monitor electrolytes Repeat labs in a.m. Consulted pharmacist for electrolyte replacement   Abdominal pain, chronic, right lower quadrant:  Etiology is not clear.  May be related to constipation.   Patient had lipase 25  Constipation resolved just evidenced by repeat KUB Plan: Home tramadol Home Lyrica Reassess pain in a.m. and consider  discharge if improved   Palpitation:  Likely due to electrolytes disturbance.  Patient has some chest discomfort, but troponin negative x 2.     Constipation 10/9 change to MiraLAX twice daily, Dulcolax 10 mg nightly and Dulcolax suppository as needed Continued Llinzess S/p enema, had small BM on 10/8  DVT (deep venous thrombosis)  -Eliquis   Essential hypertension:  Blood pressure 119/75.  Patient seems to be not taking metoprolol -IV hydralazine as needed   GERD without esophagitis -Protonix   Thrombocytopenia: This is chronic issue, platelet 123 -Follow-up CBC   Protein-calorie malnutrition, moderate: Body weight 43.1 kg, BMI 17.38 -Ensure nutrition supplement -Consult to nutrition  Right wrist cellulitis, patient was on Keflex which causes her itching so she refused.  Changed the antibiotics to doxycycline 100 mg p.o. twice daily for 5 days.  Follow with PCP.   Body mass index is 16.61 kg/m.  Nutrition Problem: Moderate Malnutrition Etiology: chronic illness Interventions:  Diet: Dysphagia 3 diet DVT Prophylaxis: Therapeutic Anticoagulation with Eliquis    Advance goals of care discussion: Full code  Family Communication: family was not present at bedside, at the time of interview.  The pt provided permission to discuss medical plan with the family. Opportunity was given to ask question and all questions were answered satisfactorily.   Disposition:  Pt is from Home, admitted with constipation and electrolyte imbalance, still has electrolyte imbalance and constipation, which precludes a safe discharge. Discharge to home with home health services, when stable, most likely in 1 to 2 days..  Subjective: No significant events overnight, patient stated that she had BM yesterday but small amount, still feels constipated.  Patient has small area of cellulitis near right wrist, she was given Keflex but it causes her itching so she refused to take it.    Physical  Exam: General: NAD, lying comfortably Appear in no distress, affect appropriate Eyes: PERRLA ENT: Oral Mucosa Clear, moist  Neck: no JVD,  Cardiovascular: S1 and S2 Present, no Murmur,  Respiratory: good respiratory effort, Bilateral Air entry equal and Decreased, no Crackles, no wheezes Abdomen: Bowel Sound present, Soft and no tenderness,  Skin: Right wrist cellulitis Extremities: no Pedal edema, no calf tenderness Neurologic: without any new focal findings Gait not checked due to patient safety concerns  Vitals:   09/20/23 1555 09/20/23 2329 09/21/23 0734 09/21/23 0856  BP: 114/67 93/68 (!) 85/56 93/63  Pulse: 85 88 85 85  Resp: 18 16 17    Temp: 97.7 F (36.5 C) 97.6 F (36.4 C) (!) 97.5 F (36.4 C)   TempSrc:      SpO2: (!) 85% 100% 97%   Weight:      Height:        Intake/Output Summary (Last 24 hours) at 09/21/2023 1544 Last data filed at 09/21/2023 1435 Gross per 24 hour  Intake 360 ml  Output --  Net 360 ml   Filed Weights   09/19/23 1707 09/20/23 1444  Weight: 43.1 kg 41.2 kg    Data Reviewed: I have personally reviewed and interpreted daily labs, tele strips, imagings as discussed above. I reviewed all nursing notes, pharmacy notes, vitals, pertinent old records I have discussed plan of care as described above with RN and patient/family.  CBC: Recent Labs  Lab 09/17/23 2124 09/19/23 1718 09/20/23 0532  WBC 5.4 5.2 4.0  HGB 11.4* 11.1* 10.1*  HCT 33.6* 33.8* 30.4*  MCV 79.8* 79.7* 79.2*  PLT 118* 123* 117*   Basic Metabolic Panel: Recent Labs  Lab 09/17/23 2124 09/19/23 1718 09/19/23 1943 09/20/23 0532 09/21/23 0642  NA 139 138  --  139 141  K 2.6* 2.8*  --  3.8 3.4*  CL 98 99  --  104 103  CO2 29 25  --  24 26  GLUCOSE 124* 83  --  74 101*  BUN 17 31*  --  22* 13  CREATININE 0.85 0.94  --  0.75 0.64  CALCIUM 9.2 9.1  --  8.3* 9.1  MG 1.4* 1.4*  --  1.9 1.2*  PHOS  --   --  3.6  --  3.0    Studies: No results found.  Scheduled  Meds:  apixaban  5 mg Oral BID   bisacodyl  10 mg Oral QHS   cyanocobalamin  1,000 mcg Oral Daily   doxycycline  100 mg Oral BID   feeding supplement  237 mL Oral BID BM   linaclotide  290 mcg Oral Daily   magnesium chloride  1 tablet Oral BID   methylphenidate  20 mg Oral TID   multivitamin with minerals  1 tablet Oral Daily   pantoprazole  40 mg Oral Daily   polyethylene glycol  17 g Oral BID   pregabalin  150 mg Oral BID   sucralfate  1 g Oral QID   [START ON 09/22/2023] Vitamin D (Ergocalciferol)  50,000 Units Oral Q7 days   Continuous Infusions: PRN Meds: acetaminophen, albuterol, bisacodyl, diltiazem, famotidine, lactulose, methocarbamol, montelukast, ondansetron (ZOFRAN) IV, traMADol  Time spent: 35 minutes  Author: Gillis Santa. MD [facility] 09/21/2023 3:44 PM  To reach On-call, see care teams to locate the attending and  reach out to them via [email or web address]. If 7PM-7AM, please contact night-coverage If you still have difficulty reaching the attending provider, please page the [facility] (Director on Call) for [facility] on amion for assistance.

## 2023-09-21 NOTE — Care Management Obs Status (Signed)
MEDICARE OBSERVATION STATUS NOTIFICATION   Patient Details  Name: [name] MRN: [id number] Date of Birth: [birth year]   Medicare Observation Status Notification Given:  Yes    Marlowe Sax, RN 09/21/2023, 10:30 AM

## 2023-09-21 NOTE — Plan of Care (Signed)
  Problem: Clinical Measurements: Goal: Ability to maintain clinical measurements within normal limits will improve Outcome: Progressing Goal: Will remain free from infection Outcome: Progressing Goal: Diagnostic test results will improve Outcome: Progressing   Problem: Coping: Goal: Level of anxiety will decrease Outcome: Progressing   Problem: Elimination: Goal: Will not experience complications related to urinary retention Outcome: Progressing   Problem: Pain Managment: Goal: General experience of comfort will improve Outcome: Progressing

## 2023-09-22 ENCOUNTER — Observation Stay: Payer: Medicare HMO

## 2023-09-22 DIAGNOSIS — E878 Other disorders of electrolyte and fluid balance, not elsewhere classified: Secondary | ICD-10-CM | POA: Diagnosis not present

## 2023-09-22 LAB — BASIC METABOLIC PANEL
Anion gap: 9 (ref 5–15)
BUN: 11 mg/dL (ref 6–20)
CO2: 27 mmol/L (ref 22–32)
Calcium: 8.8 mg/dL — ABNORMAL LOW (ref 8.9–10.3)
Chloride: 102 mmol/L (ref 98–111)
Creatinine, Ser: 0.72 mg/dL (ref 0.44–1.00)
GFR, Estimated: >60 mL/min (ref >60)
Glucose, Bld: 122 mg/dL — ABNORMAL HIGH (ref 70–99)
Potassium: 3.6 mmol/L (ref 3.5–5.1)
Sodium: 138 mmol/L (ref 135–145)

## 2023-09-22 LAB — MAGNESIUM: Magnesium: 1.5 mg/dL — ABNORMAL LOW (ref 1.7–2.4)

## 2023-09-22 MED ORDER — IOHEXOL 300 MG/ML  SOLN
100.0000 mL | Freq: Once | INTRAMUSCULAR | Status: AC | PRN
  Administered 2023-09-22: 100 mL via INTRAVENOUS

## 2023-09-22 MED ORDER — MAGNESIUM SULFATE 2 GM/50ML IV SOLN
2.0000 g | Freq: Once | INTRAVENOUS | Status: AC
  Administered 2023-09-22: 2 g via INTRAVENOUS
  Filled 2023-09-22: qty 50

## 2023-09-22 MED ORDER — SORBITOL 70 % SOLN
400.0000 mL | TOPICAL_OIL | Freq: Once | ORAL | Status: AC
  Administered 2023-09-22: 400 mL via RECTAL
  Filled 2023-09-22: qty 100

## 2023-09-22 MED ORDER — IOHEXOL 9 MG/ML PO SOLN
500.0000 mL | ORAL | Status: AC
  Administered 2023-09-22: 500 mL via ORAL

## 2023-09-22 NOTE — Progress Notes (Signed)
[facility] Progress Note  Patient: [name]    ZOX:[id number]  DOA: 09/19/2023     Date of Service: the patient was seen and examined on 09/22/2023  Chief Complaint  Patient presents with   Chest Pain   Brief hospital course: 42 y.o. female with medical history significant of Gitetlman syndrome, hypokalemia, hypomagnesemia, RSD (reflex sympathetic dystrophy), POTS, SA node dysfunction status post pacemaker placement, DVT on Eliquis, and cerebral palsy, who present with palpitation, constipation and abdominal pain.   Patient has history of cerebral palsy, it is very difficulty to communicate with her.  Per her mom (I called her mother by phone), patient has been constipated recently.  Patient has chronic lower abdominal pain.  She complains of right lower abdominal pain.  No nausea, vomiting.  No fever or chills.  Patient was seen in ED yesterday.  She had CT scan per renal stone protocol which showed large volume of stool throughout the colon, otherwise negative.  Patient received enema with improvement of her constipation, but still has abdominal pain.  Per her mother, patient has palpitation and some chest discomfort, no cough or shortness breath.  No symptoms of UTI. Of note, her PCP started her on Keflex on 10/3 for right forearm wound infection   10/8: Patient underwent enema with good result on 10/7.  Following day still endorsing abdominal pain    Assessment and Plan: Electrolyte disturbance including hypokalemia and hypomagnesemia:  Potassium 2.8, magnesium 1.4, phosphorus 3.6.  On admission.   Electrolytes have been corrected. Plan: Monitor electrolytes Repeat labs in a.m. Consulted pharmacist for electrolyte replacement   Abdominal pain, chronic, right lower quadrant:  Etiology is not clear.  May be related to constipation.   Patient had lipase 25  Constipation resolved just evidenced by repeat KUB Plan: Home tramadol Home Lyrica   Palpitation:  Likely due  to electrolytes disturbance.  Patient has some chest discomfort, but troponin negative x 2.     Constipation 10/9 change to MiraLAX twice daily, Dulcolax 10 mg nightly and Dulcolax suppository as needed Continued Llinzess S/p enema, had small BM on 10/8 10/10 no BM for past 2 days, smog enema given today  DVT (deep venous thrombosis)  -Eliquis   Hypotension, blood pressure remained soft. Continue to monitor BP Essential hypertension:  Blood pressure 119/75.  Toprol-XL and propranolol are listed in the med rec, discontinued medications due to low blood pressure. Started Cardizem IR as needed for palpitations.    GERD without esophagitis -Protonix   Thrombocytopenia: This is chronic issue, platelet 123 -Follow-up CBC   Protein-calorie malnutrition, moderate: Body weight 43.1 kg, BMI 17.38 -Ensure nutrition supplement -Consult to nutrition  Right wrist cellulitis, patient was on Keflex which causes her itching so she refused.  Changed the antibiotics to doxycycline 100 mg p.o. twice daily for 5 days.  Follow with PCP.  Vitamin D Insufficiency: started vitamin D 50,000 units p.o. weekly, follow with PCP to repeat vitamin D level after 3 to 6 months.   Body mass index is 16.53 kg/m.  Nutrition Problem: Moderate Malnutrition Etiology: chronic illness Interventions:  Diet: Dysphagia 3 diet DVT Prophylaxis: Therapeutic Anticoagulation with Eliquis    Advance goals of care discussion: Full code  Family Communication: family was not present at bedside, at the time of interview.  The pt provided permission to discuss medical plan with the family. Opportunity was given to ask question and all questions were answered satisfactorily.   Disposition:  Pt is from Home, admitted with  constipation and electrolyte imbalance, still has electrolyte imbalance and constipation, which precludes a safe discharge. Discharge to home with home health services, when stable, most likely in 1 to 2  days..  Subjective: No significant events overnight, patient did move bowels after enema.  She was requesting RN to clean her up.  Denied any other complaints.   Physical Exam: General: NAD, lying comfortably Appear in no distress, affect appropriate Eyes: PERRLA ENT: Oral Mucosa Clear, moist  Neck: no JVD,  Cardiovascular: S1 and S2 Present, no Murmur,  Respiratory: good respiratory effort, Bilateral Air entry equal and Decreased, no Crackles, no wheezes Abdomen: Bowel Sound present, Soft and no tenderness,  Skin: Right wrist cellulitis Extremities: no Pedal edema, no calf tenderness Neurologic: without any new focal findings Gait not checked due to patient safety concerns  Vitals:   09/22/23 0012 09/22/23 0500 09/22/23 0841 09/22/23 1431  BP: (!) 90/59  92/63 (!) 93/56  Pulse: 89  87 86  Resp: 16  15 16   Temp: (!) 97.3 F (36.3 C)  97.6 F (36.4 C)   TempSrc: Oral     SpO2: 98%  100% 99%  Weight:  41 kg    Height:        Intake/Output Summary (Last 24 hours) at 09/22/2023 1517 Last data filed at 09/21/2023 2145 Gross per 24 hour  Intake 240 ml  Output 200 ml  Net 40 ml   Filed Weights   09/19/23 1707 09/20/23 1444 09/22/23 0500  Weight: 43.1 kg 41.2 kg 41 kg    Data Reviewed: I have personally reviewed and interpreted daily labs, tele strips, imagings as discussed above. I reviewed all nursing notes, pharmacy notes, vitals, pertinent old records I have discussed plan of care as described above with RN and patient/family.  CBC: Recent Labs  Lab 09/17/23 2124 09/19/23 1718 09/20/23 0532  WBC 5.4 5.2 4.0  HGB 11.4* 11.1* 10.1*  HCT 33.6* 33.8* 30.4*  MCV 79.8* 79.7* 79.2*  PLT 118* 123* 117*   Basic Metabolic Panel: Recent Labs  Lab 09/17/23 2124 09/19/23 1718 09/19/23 1943 09/20/23 0532 09/21/23 0642 09/22/23 0527  NA 139 138  --  139 141 138  K 2.6* 2.8*  --  3.8 3.4* 3.6  CL 98 99  --  104 103 102  CO2 29 25  --  24 26 27   GLUCOSE 124* 83   --  74 101* 122*  BUN 17 31*  --  22* 13 11  CREATININE 0.85 0.94  --  0.75 0.64 0.72  CALCIUM 9.2 9.1  --  8.3* 9.1 8.8*  MG 1.4* 1.4*  --  1.9 1.2* 1.5*  PHOS  --   --  3.6  --  3.0  --     Studies: No results found.  Scheduled Meds:  apixaban  5 mg Oral BID   bisacodyl  10 mg Oral QHS   cyanocobalamin  1,000 mcg Oral Daily   doxycycline  100 mg Oral BID   feeding supplement  237 mL Oral BID BM   linaclotide  290 mcg Oral Daily   magnesium chloride  1 tablet Oral BID   methylphenidate  20 mg Oral TID   multivitamin with minerals  1 tablet Oral Daily   pantoprazole  40 mg Oral Daily   polyethylene glycol  17 g Oral BID   pregabalin  150 mg Oral BID   sucralfate  1 g Oral QID   Vitamin D (Ergocalciferol)  50,000 Units Oral Q7  days   Continuous Infusions: PRN Meds: acetaminophen, albuterol, bisacodyl, diltiazem, famotidine, lactulose, methocarbamol, montelukast, ondansetron (ZOFRAN) IV, traMADol  Time spent: 35 minutes  Author: Gillis Santa. MD [facility] 09/22/2023 3:17 PM  To reach On-call, see care teams to locate the attending and reach out to them via [email or web address]. If 7PM-7AM, please contact night-coverage If you still have difficulty reaching the attending provider, please page the [facility] (Director on Call) for [facility] on amion for assistance.

## 2023-09-22 NOTE — Plan of Care (Signed)

## 2023-09-22 NOTE — Progress Notes (Signed)
PHARMACY CONSULT NOTE - FOLLOW UP  Pharmacy Consult for Electrolyte Monitoring and Replacement   Recent Labs: Potassium (mmol/L)  Date Value  09/22/2023 3.6  01/19/2015 3.4 (L)   Magnesium (mg/dL)  Date Value  16/09/9603 1.5 (L)  01/19/2015 1.0 (L)   Calcium (mg/dL)  Date Value  54/08/8118 8.8 (L)   Calcium, Total (mg/dL)  Date Value  14/78/2956 8.3 (L)   Albumin (g/dL)  Date Value  21/30/8657 4.0  06/14/2014 4.0   Phosphorus (mg/dL)  Date Value  84/69/6295 3.0   Sodium (mmol/L)  Date Value  09/22/2023 138  01/19/2015 142     Assessment: 42 yo female presented by EMS due to palpatation, chest pain and abdominal pain.  In ED patient found to have hypokalemia and hypomagnesemia.  Goal of Therapy:  Electrolytes WNL   Plan:  Mg = 1.5, give MagSulf 2 grams IV x 1 Recheck electrolytes with AM labs  Barrie Folk ,PharmD Clinical Pharmacist 09/22/2023 7:03 AM

## 2023-09-23 DIAGNOSIS — E878 Other disorders of electrolyte and fluid balance, not elsewhere classified: Secondary | ICD-10-CM | POA: Diagnosis not present

## 2023-09-23 LAB — CBC
HCT: 34.4 % — ABNORMAL LOW (ref 36.0–46.0)
Hemoglobin: 11.5 g/dL — ABNORMAL LOW (ref 12.0–15.0)
MCH: 26.6 pg (ref 26.0–34.0)
MCHC: 33.4 g/dL (ref 30.0–36.0)
MCV: 79.4 fL — ABNORMAL LOW (ref 80.0–100.0)
Platelets: 179 10*3/uL (ref 150–400)
RBC: 4.33 MIL/uL (ref 3.87–5.11)
RDW: 13 % (ref 11.5–15.5)
WBC: 5.5 10*3/uL (ref 4.0–10.5)
nRBC: 0 % (ref 0.0–0.2)

## 2023-09-23 LAB — BASIC METABOLIC PANEL
Anion gap: 11 (ref 5–15)
BUN: 11 mg/dL (ref 6–20)
CO2: 28 mmol/L (ref 22–32)
Calcium: 9.1 mg/dL (ref 8.9–10.3)
Chloride: 101 mmol/L (ref 98–111)
Creatinine, Ser: 0.71 mg/dL (ref 0.44–1.00)
GFR, Estimated: >60 mL/min (ref >60)
Glucose, Bld: 119 mg/dL — ABNORMAL HIGH (ref 70–99)
Potassium: 3.5 mmol/L (ref 3.5–5.1)
Sodium: 140 mmol/L (ref 135–145)

## 2023-09-23 LAB — MAGNESIUM: Magnesium: 1.5 mg/dL — ABNORMAL LOW (ref 1.7–2.4)

## 2023-09-23 MED ORDER — MAGNESIUM SULFATE 2 GM/50ML IV SOLN
2.0000 g | Freq: Once | INTRAVENOUS | Status: AC
  Administered 2023-09-23: 2 g via INTRAVENOUS
  Filled 2023-09-23: qty 50

## 2023-09-23 MED ORDER — POTASSIUM CHLORIDE 20 MEQ PO PACK: 40.0000 meq | PACK | Freq: Once | ORAL | Status: DC

## 2023-09-23 MED ORDER — BISACODYL 5 MG PO TBEC
10.0000 mg | DELAYED_RELEASE_TABLET | Freq: Once | ORAL | Status: AC
  Administered 2023-09-23: 10 mg via ORAL
  Filled 2023-09-23: qty 2

## 2023-09-23 MED ORDER — POTASSIUM CHLORIDE CRYS ER 20 MEQ PO TBCR
40.0000 meq | EXTENDED_RELEASE_TABLET | Freq: Once | ORAL | Status: AC
  Administered 2023-09-23: 40 meq via ORAL
  Filled 2023-09-23: qty 2

## 2023-09-23 MED ORDER — MIDODRINE HCL 5 MG PO TABS
10.0000 mg | ORAL_TABLET | Freq: Three times a day (TID) | ORAL | Status: DC
  Administered 2023-09-23 – 2023-09-28 (×16): 10 mg via ORAL
  Filled 2023-09-23 (×17): qty 2

## 2023-09-23 NOTE — Progress Notes (Signed)
PHARMACY CONSULT NOTE - FOLLOW UP  Pharmacy Consult for Electrolyte Monitoring and Replacement   Recent Labs: Potassium (mmol/L)  Date Value  09/23/2023 3.5  01/19/2015 3.4 (L)   Magnesium (mg/dL)  Date Value  78/29/5621 1.5 (L)  01/19/2015 1.0 (L)   Calcium (mg/dL)  Date Value  30/86/5784 9.1   Calcium, Total (mg/dL)  Date Value  69/62/9528 8.3 (L)   Albumin (g/dL)  Date Value  41/32/4401 4.0  06/14/2014 4.0   Phosphorus (mg/dL)  Date Value  02/72/5366 3.0   Sodium (mmol/L)  Date Value  09/23/2023 140  01/19/2015 142     Assessment: 42 yo female presented by EMS due to palpatation, chest pain and abdominal pain.  In ED patient found to have hypokalemia and hypomagnesemia.  Goal of Therapy:  Electrolytes WNL   Plan:  Will give Magnesium Sulfate 2 grams IV x 1 Continue oral magnesium chloride BID Recheck electrolytes with AM labs  Lowella Bandy ,PharmD, BCPS Clinical Pharmacist 09/23/2023 7:06 AM

## 2023-09-23 NOTE — Progress Notes (Signed)
[facility] Progress Note  Patient: [name]    ZOX:[id number]  DOA: 09/19/2023     Date of Service: the patient was seen and examined on 09/23/2023  Chief Complaint  Patient presents with   Chest Pain   Brief hospital course: 42 y.o. female with PMH of cerebral palsy, Gitelman syndrome (autosomal recessive  salt-losing (ie, salt-wasting) tubulopathies), hypokalemia, hypomagnesemia, RSD (reflex sympathetic dystrophy) also know as Complex regional pain syndrome (CRPS) , POTS (Postural tachycardia syndrome), SA node dysfunction status post pacemaker placement, DVT on Eliquis, who present with palpitation, constipation and abdominal pain.   Patient has history of cerebral palsy, it is very difficulty to communicate with her.  Per her mom (I called her mother by phone), patient has been constipated recently.  Patient has chronic lower abdominal pain.  She complains of right lower abdominal pain.  No nausea, vomiting.  No fever or chills.  Patient was seen in ED yesterday.  She had CT scan per renal stone protocol which showed large volume of stool throughout the colon, otherwise negative.  Patient received enema with improvement of her constipation, but still has abdominal pain.  Per her mother, patient has palpitation and some chest discomfort, no cough or shortness breath.  No symptoms of UTI. Of note, her PCP started her on Keflex on 10/3 for right forearm wound infection   10/8: Patient underwent enema with good result on 10/7.  Following day still endorsing abdominal pain    Assessment and Plan: Electrolyte disturbance including hypokalemia and hypomagnesemia:  Potassium 2.8, magnesium 1.4, phosphorus 3.6.  On admission.   Electrolytes have been corrected. Plan: Monitor electrolytes Repeat labs in a.m. Consulted pharmacist for electrolyte replacement   Constipation 10/9 change to MiraLAX twice daily, Dulcolax 10 mg nightly and Dulcolax suppository as needed Continued  Llinzess S/p enema, had small BM on 10/8 10/10 no BM for past 2 days, smog enema given today 10/10 CT a/p: No acute findings, still has large stool burden, but less as compared to prior CT scan, constipation improving. Continue MiraLAX twice daily and Dulcolax nightly.   Abdominal pain, chronic, right lower quadrant:  Etiology is not clear.  May be related to constipation.   Patient had lipase 25  Constipation improving Plan: Home tramadol Home Lyrica   Palpitation:  Likely due to electrolytes disturbance.  Patient has some chest discomfort, but troponin negative x 2.    DVT (deep venous thrombosis)  -Eliquis   Hypotension, blood pressure remained soft. 10/11 started midodrine 10 mg p.o. 3 times daily with holding parameters Continue to monitor BP Essential hypertension:  Blood pressure soft to low.  Toprol-XL and propranolol are listed in the med rec, discontinued medications due to low blood pressure. Started Cardizem IR as needed for palpitations.    GERD without esophagitis -Protonix   Thrombocytopenia: Resolved This is chronic issue, platelet 179 on 09/23/23, improved, thrombocytopenia resolved. -Follow-up CBC   Protein-calorie malnutrition, moderate: Body weight 43.1 kg, BMI 17.38 -Ensure nutrition supplement -Consult to nutrition  Right wrist cellulitis, patient was on Keflex which causes her itching so she refused.  Changed the antibiotics to doxycycline 100 mg p.o. twice daily for 5 days.  Follow with PCP.  Vitamin D Insufficiency: started vitamin D 50,000 units p.o. weekly, follow with PCP to repeat vitamin D level after 3 to 6 months.   Body mass index is 16.53 kg/m.  Nutrition Problem: Moderate Malnutrition Etiology: chronic illness Interventions:  Diet: Dysphagia 3 diet DVT Prophylaxis: Therapeutic Anticoagulation with  Eliquis    Advance goals of care discussion: Full code  Family Communication: family was not present at bedside, at the time of  interview.  The pt provided permission to discuss medical plan with the family. Opportunity was given to ask question and all questions were answered satisfactorily.   Disposition:  Pt is from Home, admitted with constipation and electrolyte imbalance, still has electrolyte imbalance and constipation, which precludes a safe discharge. Discharge to home with home health services, when stable, most likely in 1 to 2 days..  Subjective: No significant events overnight, patient refused last night Dulcolax as she was having loose stools after enema.  CT scan is still showing stool burden, so patient was advised to continue taking laxatives as hard stool is not coming out, it is just liquid surrounding the stool so she agreed to take the laxatives. Still has generalized abdominal pain, no nausea vomiting.  We will continue current treatment.   Physical Exam: General: NAD, lying comfortably Appear in no distress, affect appropriate Eyes: PERRLA ENT: Oral Mucosa Clear, moist  Neck: no JVD,  Cardiovascular: S1 and S2 Present, no Murmur,  Respiratory: good respiratory effort, Bilateral Air entry equal and Decreased, no Crackles, no wheezes Abdomen: Bowel Sound present, Soft and mild generalized tenderness,  Skin: Right wrist cellulitis Extremities: no Pedal edema, no calf tenderness Neurologic: without any new focal findings Gait not checked due to patient safety concerns  Vitals:   09/22/23 1431 09/22/23 2358 09/23/23 0423 09/23/23 0748  BP: (!) 93/56 93/63  (!) 83/55  Pulse: 86 88  85  Resp: 16 16  18   Temp:  98.1 F (36.7 C)  98.7 F (37.1 C)  TempSrc:    Oral  SpO2: 99% 99%  100%  Weight:   41 kg   Height:       No intake or output data in the 24 hours ending 09/23/23 1337  Filed Weights   09/20/23 1444 09/22/23 0500 09/23/23 0423  Weight: 41.2 kg 41 kg 41 kg    Data Reviewed: I have personally reviewed and interpreted daily labs, tele strips, imagings as discussed above. I  reviewed all nursing notes, pharmacy notes, vitals, pertinent old records I have discussed plan of care as described above with RN and patient/family.  CBC: Recent Labs  Lab 09/17/23 2124 09/19/23 1718 09/20/23 0532 09/23/23 0534  WBC 5.4 5.2 4.0 5.5  HGB 11.4* 11.1* 10.1* 11.5*  HCT 33.6* 33.8* 30.4* 34.4*  MCV 79.8* 79.7* 79.2* 79.4*  PLT 118* 123* 117* 179   Basic Metabolic Panel: Recent Labs  Lab 09/19/23 1718 09/19/23 1943 09/20/23 0532 09/21/23 0642 09/22/23 0527 09/23/23 0534  NA 138  --  139 141 138 140  K 2.8*  --  3.8 3.4* 3.6 3.5  CL 99  --  104 103 102 101  CO2 25  --  24 26 27 28   GLUCOSE 83  --  74 101* 122* 119*  BUN 31*  --  22* 13 11 11   CREATININE 0.94  --  0.75 0.64 0.72 0.71  CALCIUM 9.1  --  8.3* 9.1 8.8* 9.1  MG 1.4*  --  1.9 1.2* 1.5* 1.5*  PHOS  --  3.6  --  3.0  --   --     Studies: CT ABDOMEN PELVIS W CONTRAST  Result Date: 09/22/2023 CLINICAL DATA:  Left lower quadrant pain EXAM: CT ABDOMEN AND PELVIS WITH CONTRAST TECHNIQUE: Multidetector CT imaging of the abdomen and pelvis was performed using  the standard protocol following bolus administration of intravenous contrast. RADIATION DOSE REDUCTION: This exam was performed according to the departmental dose-optimization program which includes automated exposure control, adjustment of the mA and/or kV according to patient size and/or use of iterative reconstruction technique. CONTRAST:  OMNIPAQUE IOHEXOL 300 MG/ML  SOLN COMPARISON:  Radiograph 09/20/2023, CT 09/17/2023 FINDINGS: Lower chest: Lung bases demonstrate no acute airspace disease Hepatobiliary: Cholecystectomy. Hepatic steatosis. No biliary dilatation Pancreas: No acute inflammatory process. Poorly visible distal body and tail of the pancreas which could be due to chronic atrophy or agenesis. Spleen: Normal in size without focal abnormality. Adrenals/Urinary Tract: Adrenal glands are normal. Kidneys show no hydronephrosis. Subcentimeter  renal hypodensities too small to further characterize, no imaging follow-up is recommended. The bladder is unremarkable Stomach/Bowel: Moderate air distension of the stomach. There is no dilated small bowel. No convincing bowel wall thickening. Decreased stool burden compared to prior CT Vascular/Lymphatic: Nonaneurysmal aorta.  No suspicious lymph nodes Reproductive: Status post hysterectomy. No adnexal masses. Other: Negative for free air.  No significant ascites Musculoskeletal: No acute osseous abnormality. IMPRESSION: 1. No CT evidence for acute intra-abdominal or pelvic abnormality. 2. Hepatic steatosis. 3. Decreased stool burden compared to prior CT. Electronically Signed   By: Jasmine Pang [name]   On: 09/22/2023 21:34    Scheduled Meds:  apixaban  5 mg Oral BID   bisacodyl  10 mg Oral QHS   cyanocobalamin  1,000 mcg Oral Daily   doxycycline  100 mg Oral BID   feeding supplement  237 mL Oral BID BM   linaclotide  290 mcg Oral Daily   magnesium chloride  1 tablet Oral BID   methylphenidate  20 mg Oral TID   midodrine  10 mg Oral TID WC   multivitamin with minerals  1 tablet Oral Daily   pantoprazole  40 mg Oral Daily   polyethylene glycol  17 g Oral BID   pregabalin  150 mg Oral BID   sucralfate  1 g Oral QID   Vitamin D (Ergocalciferol)  50,000 Units Oral Q7 days   Continuous Infusions: PRN Meds: acetaminophen, albuterol, bisacodyl, diltiazem, famotidine, lactulose, methocarbamol, montelukast, ondansetron (ZOFRAN) IV, traMADol  Time spent: 35 minutes  Author: Gillis Santa. MD [facility] 09/23/2023 1:37 PM  To reach On-call, see care teams to locate the attending and reach out to them via [email or web address]. If 7PM-7AM, please contact night-coverage If you still have difficulty reaching the attending provider, please page the [facility] (Director on Call) for [facility] on amion for assistance.

## 2023-09-23 NOTE — Plan of Care (Signed)

## 2023-09-24 ENCOUNTER — Observation Stay: Payer: Medicare HMO

## 2023-09-24 DIAGNOSIS — Z882 Allergy status to sulfonamides status: Secondary | ICD-10-CM | POA: Diagnosis not present

## 2023-09-24 DIAGNOSIS — Z7901 Long term (current) use of anticoagulants: Secondary | ICD-10-CM | POA: Diagnosis not present

## 2023-09-24 DIAGNOSIS — E878 Other disorders of electrolyte and fluid balance, not elsewhere classified: Secondary | ICD-10-CM | POA: Diagnosis not present

## 2023-09-24 DIAGNOSIS — Z888 Allergy status to other drugs, medicaments and biological substances status: Secondary | ICD-10-CM | POA: Diagnosis not present

## 2023-09-24 DIAGNOSIS — Z8249 Family history of ischemic heart disease and other diseases of the circulatory system: Secondary | ICD-10-CM | POA: Diagnosis not present

## 2023-09-24 DIAGNOSIS — I495 Sick sinus syndrome: Secondary | ICD-10-CM | POA: Diagnosis present

## 2023-09-24 DIAGNOSIS — Z86718 Personal history of other venous thrombosis and embolism: Secondary | ICD-10-CM | POA: Diagnosis not present

## 2023-09-24 DIAGNOSIS — G809 Cerebral palsy, unspecified: Secondary | ICD-10-CM | POA: Diagnosis present

## 2023-09-24 DIAGNOSIS — L03113 Cellulitis of right upper limb: Secondary | ICD-10-CM | POA: Diagnosis present

## 2023-09-24 DIAGNOSIS — Z681 Body mass index (BMI) 19 or less, adult: Secondary | ICD-10-CM | POA: Diagnosis not present

## 2023-09-24 DIAGNOSIS — D696 Thrombocytopenia, unspecified: Secondary | ICD-10-CM | POA: Diagnosis present

## 2023-09-24 DIAGNOSIS — E559 Vitamin D deficiency, unspecified: Secondary | ICD-10-CM | POA: Diagnosis present

## 2023-09-24 DIAGNOSIS — I10 Essential (primary) hypertension: Secondary | ICD-10-CM | POA: Diagnosis present

## 2023-09-24 DIAGNOSIS — Z950 Presence of cardiac pacemaker: Secondary | ICD-10-CM | POA: Diagnosis not present

## 2023-09-24 DIAGNOSIS — Q9351 Angelman syndrome: Secondary | ICD-10-CM | POA: Diagnosis not present

## 2023-09-24 DIAGNOSIS — Z833 Family history of diabetes mellitus: Secondary | ICD-10-CM | POA: Diagnosis not present

## 2023-09-24 DIAGNOSIS — Z881 Allergy status to other antibiotic agents status: Secondary | ICD-10-CM | POA: Diagnosis not present

## 2023-09-24 DIAGNOSIS — K219 Gastro-esophageal reflux disease without esophagitis: Secondary | ICD-10-CM | POA: Diagnosis present

## 2023-09-24 DIAGNOSIS — Z79899 Other long term (current) drug therapy: Secondary | ICD-10-CM | POA: Diagnosis not present

## 2023-09-24 DIAGNOSIS — K5641 Fecal impaction: Secondary | ICD-10-CM | POA: Diagnosis present

## 2023-09-24 DIAGNOSIS — R079 Chest pain, unspecified: Secondary | ICD-10-CM | POA: Diagnosis present

## 2023-09-24 DIAGNOSIS — R54 Age-related physical debility: Secondary | ICD-10-CM | POA: Diagnosis present

## 2023-09-24 DIAGNOSIS — E440 Moderate protein-calorie malnutrition: Secondary | ICD-10-CM | POA: Diagnosis present

## 2023-09-24 DIAGNOSIS — R002 Palpitations: Secondary | ICD-10-CM | POA: Diagnosis present

## 2023-09-24 DIAGNOSIS — E876 Hypokalemia: Secondary | ICD-10-CM | POA: Diagnosis present

## 2023-09-24 DIAGNOSIS — I959 Hypotension, unspecified: Secondary | ICD-10-CM | POA: Diagnosis present

## 2023-09-24 LAB — BASIC METABOLIC PANEL
Anion gap: 9 (ref 5–15)
BUN: 15 mg/dL (ref 6–20)
CO2: 29 mmol/L (ref 22–32)
Calcium: 9.2 mg/dL (ref 8.9–10.3)
Chloride: 100 mmol/L (ref 98–111)
Creatinine, Ser: 0.71 mg/dL (ref 0.44–1.00)
GFR, Estimated: >60 mL/min (ref >60)
Glucose, Bld: 106 mg/dL — ABNORMAL HIGH (ref 70–99)
Potassium: 4 mmol/L (ref 3.5–5.1)
Sodium: 138 mmol/L (ref 135–145)

## 2023-09-24 LAB — MAGNESIUM: Magnesium: 1.4 mg/dL — ABNORMAL LOW (ref 1.7–2.4)

## 2023-09-24 MED ORDER — MAGNESIUM CHLORIDE 64 MG PO TBEC
1.0000 | DELAYED_RELEASE_TABLET | Freq: Two times a day (BID) | ORAL | Status: DC
  Filled 2023-09-24 (×3): qty 1

## 2023-09-24 MED ORDER — BISACODYL 10 MG RE SUPP
10.0000 mg | Freq: Once | RECTAL | Status: AC
  Administered 2023-09-24: 10 mg via RECTAL
  Filled 2023-09-24: qty 1

## 2023-09-24 MED ORDER — MAGNESIUM SULFATE 4 GM/100ML IV SOLN
4.0000 g | Freq: Once | INTRAVENOUS | Status: AC
  Administered 2023-09-24: 4 g via INTRAVENOUS
  Filled 2023-09-24: qty 100

## 2023-09-24 MED ORDER — SODIUM CHLORIDE 0.9 % IV SOLN: INTRAVENOUS | Status: AC

## 2023-09-24 NOTE — Progress Notes (Signed)
[facility] Progress Note  Patient: [name]    HQI:[id number]  DOA: 09/19/2023     Date of Service: the patient was seen and examined on 09/24/2023  Chief Complaint  Patient presents with   Chest Pain   Brief hospital course: 42 y.o. female with PMH of cerebral palsy, Gitelman syndrome (autosomal recessive  salt-losing (ie, salt-wasting) tubulopathies), hypokalemia, hypomagnesemia, RSD (reflex sympathetic dystrophy) also know as Complex regional pain syndrome (CRPS) , POTS (Postural tachycardia syndrome), SA node dysfunction status post pacemaker placement, DVT on Eliquis, who present with palpitation, constipation and abdominal pain.   Patient has history of cerebral palsy, it is very difficulty to communicate with her.  Per her mom (I called her mother by phone), patient has been constipated recently.  Patient has chronic lower abdominal pain.  She complains of right lower abdominal pain.  No nausea, vomiting.  No fever or chills.  Patient was seen in ED yesterday.  She had CT scan per renal stone protocol which showed large volume of stool throughout the colon, otherwise negative.  Patient received enema with improvement of her constipation, but still has abdominal pain.  Per her mother, patient has palpitation and some chest discomfort, no cough or shortness breath.  No symptoms of UTI. Of note, her PCP started her on Keflex on 10/3 for right forearm wound infection   10/8: Patient underwent enema with good result on 10/7.  Following day still endorsing abdominal pain    Assessment and Plan: Electrolyte disturbance including hypokalemia and hypomagnesemia:  Potassium 2.8, magnesium 1.4, phosphorus 3.6.  On admission.   Electrolytes have been corrected. Plan: Monitor electrolytes Repeat labs in a.m. Consulted pharmacist for electrolyte replacement   Constipation 10/9 change to MiraLAX twice daily, Dulcolax 10 mg nightly and Dulcolax suppository as needed Continued  Llinzess S/p enema, had small BM on 10/8 10/10 no BM for past 2 days, smog enema given today 10/10 CT a/p: No acute findings, still has large stool burden, but less as compared to prior CT scan, constipation improving. Continue MiraLAX twice daily and Dulcolax nightly. 10/12 patient feels fecal impaction, ordered x-ray KUB and agreed for suppository. RN was advised to try manual disimpaction and give suppository and use bedside commode if possible.   Abdominal pain, chronic, right lower quadrant:  Etiology is not clear.  May be related to constipation.   Patient had lipase 25  Constipation improving Plan: Home tramadol Home Lyrica   Palpitation:  Likely due to electrolytes disturbance.  Patient has some chest discomfort, but troponin negative x 2.    DVT (deep venous thrombosis)  -Eliquis   Hypotension, blood pressure remained soft. 10/11 started midodrine 10 mg p.o. 3 times daily with holding parameters Continue to monitor BP Essential hypertension:  Blood pressure soft to low.  Toprol-XL and propranolol are listed in the med rec, discontinued medications due to low blood pressure. Started Cardizem IR as needed for palpitations.    GERD without esophagitis -Protonix   Thrombocytopenia: Resolved This is chronic issue, platelet 179 on 09/23/23, improved, thrombocytopenia resolved. -Follow-up CBC   Protein-calorie malnutrition, moderate: Body weight 43.1 kg, BMI 17.38 -Ensure nutrition supplement -Consult to nutrition  Right wrist cellulitis, patient was on Keflex which causes her itching so she refused.  Changed the antibiotics to doxycycline 100 mg p.o. twice daily for 5 days.  Follow with PCP.  Vitamin D Insufficiency: started vitamin D 50,000 units p.o. weekly, follow with PCP to repeat vitamin D level after 3 to 6  months.   Body mass index is 16.53 kg/m.  Nutrition Problem: Moderate Malnutrition Etiology: chronic illness Interventions:  Diet: Dysphagia 3  diet DVT Prophylaxis: Therapeutic Anticoagulation with Eliquis    Advance goals of care discussion: Full code  Family Communication: family was not present at bedside, at the time of interview.  The pt provided permission to discuss medical plan with the family. Opportunity was given to ask question and all questions were answered satisfactorily.   Disposition:  Pt is from Home, admitted with constipation and electrolyte imbalance, still has electrolyte imbalance and constipation, which precludes a safe discharge. Discharge to home with home health services, when stable, most likely in 1 to 2 days..  Subjective: No significant events overnight except patient had a little bit vomiting x 1 episode, patient still has abdominal pain off and on, having liquidy stools but feels fecal impaction, no solid BM.  Denied any chest pain or palpitations, no shortness of breath.  Resting comfortably. Patient agreed with suppository and abdominal x-ray.   Physical Exam: General: NAD, lying comfortably Appear in no distress, affect appropriate Eyes: PERRLA ENT: Oral Mucosa Clear, moist  Neck: no JVD,  Cardiovascular: S1 and S2 Present, no Murmur,  Respiratory: good respiratory effort, Bilateral Air entry equal and Decreased, no Crackles, no wheezes Abdomen: Bowel Sound present, Soft and mild generalized tenderness,  Skin: Right wrist cellulitis Extremities: no Pedal edema, no calf tenderness Neurologic: without any new focal findings Gait not checked due to patient safety concerns  Vitals:   09/24/23 0022 09/24/23 0426 09/24/23 0704 09/24/23 0823  BP: (!) 89/68 (!) 76/56 94/75 (!) 89/55  Pulse: 85 89 86 92  Resp:    16  Temp:      TempSrc:      SpO2:  100% 100% 99%  Weight:      Height:        Intake/Output Summary (Last 24 hours) at 09/24/2023 1132 Last data filed at 09/24/2023 0900 Gross per 24 hour  Intake 240 ml  Output --  Net 240 ml    Filed Weights   09/20/23 1444 09/22/23  0500 09/23/23 0423  Weight: 41.2 kg 41 kg 41 kg    Data Reviewed: I have personally reviewed and interpreted daily labs, tele strips, imagings as discussed above. I reviewed all nursing notes, pharmacy notes, vitals, pertinent old records I have discussed plan of care as described above with RN and patient/family.  CBC: Recent Labs  Lab 09/17/23 2124 09/19/23 1718 09/20/23 0532 09/23/23 0534  WBC 5.4 5.2 4.0 5.5  HGB 11.4* 11.1* 10.1* 11.5*  HCT 33.6* 33.8* 30.4* 34.4*  MCV 79.8* 79.7* 79.2* 79.4*  PLT 118* 123* 117* 179   Basic Metabolic Panel: Recent Labs  Lab 09/19/23 1943 09/20/23 0532 09/21/23 0642 09/22/23 0527 09/23/23 0534 09/24/23 0730  NA  --  139 141 138 140 138  K  --  3.8 3.4* 3.6 3.5 4.0  CL  --  104 103 102 101 100  CO2  --  24 26 27 28 29   GLUCOSE  --  74 101* 122* 119* 106*  BUN  --  22* 13 11 11 15   CREATININE  --  0.75 0.64 0.72 0.71 0.71  CALCIUM  --  8.3* 9.1 8.8* 9.1 9.2  MG  --  1.9 1.2* 1.5* 1.5* 1.4*  PHOS 3.6  --  3.0  --   --   --     Studies: No results found.  Scheduled Meds:  apixaban  5 mg Oral BID   bisacodyl  10 mg Oral QHS   bisacodyl  10 mg Rectal Once   cyanocobalamin  1,000 mcg Oral Daily   doxycycline  100 mg Oral BID   feeding supplement  237 mL Oral BID BM   linaclotide  290 mcg Oral Daily   [START ON 09/25/2023] magnesium chloride  1 tablet Oral BID   methylphenidate  20 mg Oral TID   midodrine  10 mg Oral TID WC   multivitamin with minerals  1 tablet Oral Daily   pantoprazole  40 mg Oral Daily   polyethylene glycol  17 g Oral BID   pregabalin  150 mg Oral BID   sucralfate  1 g Oral QID   Vitamin D (Ergocalciferol)  50,000 Units Oral Q7 days   Continuous Infusions:  magnesium sulfate bolus IVPB 4 g (09/24/23 1119)   PRN Meds: acetaminophen, albuterol, bisacodyl, diltiazem, famotidine, lactulose, methocarbamol, montelukast, ondansetron (ZOFRAN) IV, traMADol  Time spent: 35 minutes  Author: Gillis Santa.  MD [facility] 09/24/2023 11:32 AM  To reach On-call, see care teams to locate the attending and reach out to them via [email or web address]. If 7PM-7AM, please contact night-coverage If you still have difficulty reaching the attending provider, please page the [facility] (Director on Call) for [facility] on amion for assistance.

## 2023-09-24 NOTE — Plan of Care (Signed)

## 2023-09-24 NOTE — Plan of Care (Signed)

## 2023-09-24 NOTE — Progress Notes (Signed)
PHARMACY CONSULT NOTE - FOLLOW UP  Pharmacy Consult for Electrolyte Monitoring and Replacement   Recent Labs: Potassium (mmol/L)  Date Value  09/24/2023 4.0  01/19/2015 3.4 (L)   Magnesium (mg/dL)  Date Value  57/32/2025 1.4 (L)  01/19/2015 1.0 (L)   Calcium (mg/dL)  Date Value  42/70/6237 9.2   Calcium, Total (mg/dL)  Date Value  62/83/1517 8.3 (L)   Albumin (g/dL)  Date Value  61/60/7371 4.0  06/14/2014 4.0   Phosphorus (mg/dL)  Date Value  06/07/9484 3.0   Sodium (mmol/L)  Date Value  09/24/2023 138  01/19/2015 142     Assessment: 42 yo female presented by EMS due to palpatation, chest pain and abdominal pain.  In ED patient found to have hypokalemia and hypomagnesemia.  Goal of Therapy:  Electrolytes WNL   Plan:  Will give Magnesium Sulfate 4 grams IV x 1 Continue oral magnesium chloride BID Recheck electrolytes with AM labs  Bettey Costa, PharmD Clinical Pharmacist 09/24/2023 9:18 AM

## 2023-09-25 DIAGNOSIS — E878 Other disorders of electrolyte and fluid balance, not elsewhere classified: Secondary | ICD-10-CM | POA: Diagnosis not present

## 2023-09-25 LAB — PHOSPHORUS: Phosphorus: 4.1 mg/dL (ref 2.5–4.6)

## 2023-09-25 LAB — BASIC METABOLIC PANEL
Anion gap: 9 (ref 5–15)
BUN: 18 mg/dL (ref 6–20)
CO2: 29 mmol/L (ref 22–32)
Calcium: 8.7 mg/dL — ABNORMAL LOW (ref 8.9–10.3)
Chloride: 101 mmol/L (ref 98–111)
Creatinine, Ser: 0.89 mg/dL (ref 0.44–1.00)
GFR, Estimated: >60 mL/min (ref >60)
Glucose, Bld: 102 mg/dL — ABNORMAL HIGH (ref 70–99)
Potassium: 4.2 mmol/L (ref 3.5–5.1)
Sodium: 139 mmol/L (ref 135–145)

## 2023-09-25 LAB — MAGNESIUM: Magnesium: 1.8 mg/dL (ref 1.7–2.4)

## 2023-09-25 MED ORDER — SODIUM CHLORIDE 0.9 % IV SOLN
INTRAVENOUS | Status: DC
  Administered 2023-09-25: 1000 mL via INTRAVENOUS

## 2023-09-25 MED ORDER — SORBITOL 70 % SOLN
400.0000 mL | TOPICAL_OIL | Freq: Two times a day (BID) | ORAL | Status: AC
  Administered 2023-09-25 – 2023-09-26 (×2): 400 mL via RECTAL
  Filled 2023-09-25: qty 100
  Filled 2023-09-25 (×4): qty 120

## 2023-09-25 NOTE — Plan of Care (Signed)

## 2023-09-25 NOTE — Progress Notes (Signed)
[facility] Progress Note  Patient: [name]    ZOX:[id number]  DOA: 09/19/2023     Date of Service: the patient was seen and examined on 09/25/2023  Chief Complaint  Patient presents with   Chest Pain   Brief hospital course: 42 y.o. female with PMH of cerebral palsy, Gitelman syndrome (autosomal recessive  salt-losing (ie, salt-wasting) tubulopathies), hypokalemia, hypomagnesemia, RSD (reflex sympathetic dystrophy) also know as Complex regional pain syndrome (CRPS) , POTS (Postural tachycardia syndrome), SA node dysfunction status post pacemaker placement, DVT on Eliquis, who present with palpitation, constipation and abdominal pain.   Patient has history of cerebral palsy, it is very difficulty to communicate with her.  Per her mom (I called her mother by phone), patient has been constipated recently.  Patient has chronic lower abdominal pain.  She complains of right lower abdominal pain.  No nausea, vomiting.  No fever or chills.  Patient was seen in ED yesterday.  She had CT scan per renal stone protocol which showed large volume of stool throughout the colon, otherwise negative.  Patient received enema with improvement of her constipation, but still has abdominal pain.  Per her mother, patient has palpitation and some chest discomfort, no cough or shortness breath.  No symptoms of UTI. Of note, her PCP started her on Keflex on 10/3 for right forearm wound infection   10/8: Patient underwent enema with good result on 10/7.  Following day still endorsing abdominal pain    Assessment and Plan: Electrolyte disturbance including hypokalemia and hypomagnesemia:  Potassium 2.8, magnesium 1.4, phosphorus 3.6.  On admission.   Electrolytes have been corrected. Plan: Monitor electrolytes Repeat labs in a.m. Consulted pharmacist for electrolyte replacement   Constipation 10/9 change to MiraLAX twice daily, Dulcolax 10 mg nightly and Dulcolax suppository as needed Continued  Llinzess S/p enema, had small BM on 10/8 10/10 no BM for past 2 days, smog enema given today 10/10 CT a/p: No acute findings, still has large stool burden, but less as compared to prior CT scan, constipation improving. Continue MiraLAX twice daily and Dulcolax nightly. 10/12 patient feels fecal impaction, x-ray KUB: Large stool in the sigmoid colon and rectum S/p Dulcolax suppository was given on 10/12 9/13, started smog enema twice daily for 2 days Continue IV fluid for hydration as patient is having liquidy diarrhea and low blood pressure   Abdominal pain, chronic, right lower quadrant:  Etiology is not clear.  May be related to constipation.   Patient had lipase 25  Constipation improving Plan: Home tramadol Home Lyrica   Palpitation:  Likely due to electrolytes disturbance.  Patient has some chest discomfort, but troponin negative x 2.    DVT (deep venous thrombosis)  -Eliquis   Hypotension, blood pressure remained soft. 10/11 started midodrine 10 mg p.o. 3 times daily with holding parameters Continue to monitor BP Essential hypertension:  Blood pressure soft to low.  Toprol-XL and propranolol are listed in the med rec, discontinued medications due to low blood pressure. Started Cardizem IR as needed for palpitations.    GERD without esophagitis -Protonix   Thrombocytopenia: Resolved This is chronic issue, platelet 179 on 09/23/23, improved, thrombocytopenia resolved. -Follow-up CBC   Protein-calorie malnutrition, moderate: Body weight 43.1 kg, BMI 17.38 -Ensure nutrition supplement -Consult to nutrition  Right wrist cellulitis, patient was on Keflex which causes her itching so she refused.  Changed the antibiotics to doxycycline 100 mg p.o. twice daily for 5 days.  Follow with PCP.  Vitamin D Insufficiency: started  vitamin D 50,000 units p.o. weekly, follow with PCP to repeat vitamin D level after 3 to 6 months.   Body mass index is 16.53 kg/m.  Nutrition  Problem: Moderate Malnutrition Etiology: chronic illness Interventions:  Diet: Dysphagia 3 diet DVT Prophylaxis: Therapeutic Anticoagulation with Eliquis    Advance goals of care discussion: Full code  Family Communication: family was not present at bedside, at the time of interview.  The pt provided permission to discuss medical plan with the family. Opportunity was given to ask question and all questions were answered satisfactorily.   Disposition:  Pt is from Home, admitted with constipation and electrolyte imbalance, still has electrolyte imbalance and constipation, which precludes a safe discharge. Discharge to home with home health services, when stable, most likely in 1 to 2 days..  Subjective: No significant events overnight, patient feels the same, does not feel improvement in the abdominal pain, still not moving bowels, feels constipated.  Agreed with enema.   Physical Exam: General: NAD, lying comfortably Appear in no distress, affect appropriate Eyes: PERRLA ENT: Oral Mucosa Clear, moist  Neck: no JVD,  Cardiovascular: S1 and S2 Present, no Murmur,  Respiratory: good respiratory effort, Bilateral Air entry equal and Decreased, no Crackles, no wheezes Abdomen: Bowel Sound present, Soft and mild generalized tenderness,  Skin: Right wrist cellulitis Extremities: no Pedal edema, no calf tenderness Neurologic: without any new focal findings Gait not checked due to patient safety concerns  Vitals:   09/24/23 0823 09/24/23 1619 09/24/23 2310 09/25/23 0739  BP: (!) 89/55 106/66 95/74 97/62   Pulse: 92 91 85 86  Resp: 16 16 16 14   Temp:  98.3 F (36.8 C) 98.1 F (36.7 C) 98 F (36.7 C)  TempSrc:   Oral   SpO2: 99% 99% 100% 100%  Weight:      Height:       No intake or output data in the 24 hours ending 09/25/23 1339   Filed Weights   09/20/23 1444 09/22/23 0500 09/23/23 0423  Weight: 41.2 kg 41 kg 41 kg    Data Reviewed: I have personally reviewed and  interpreted daily labs, tele strips, imagings as discussed above. I reviewed all nursing notes, pharmacy notes, vitals, pertinent old records I have discussed plan of care as described above with RN and patient/family.  CBC: Recent Labs  Lab 09/19/23 1718 09/20/23 0532 09/23/23 0534  WBC 5.2 4.0 5.5  HGB 11.1* 10.1* 11.5*  HCT 33.8* 30.4* 34.4*  MCV 79.7* 79.2* 79.4*  PLT 123* 117* 179   Basic Metabolic Panel: Recent Labs  Lab 09/19/23 1943 09/20/23 0532 09/21/23 1191 09/22/23 0527 09/23/23 0534 09/24/23 0730 09/25/23 0514  NA  --    < > 141 138 140 138 139  K  --    < > 3.4* 3.6 3.5 4.0 4.2  CL  --    < > 103 102 101 100 101  CO2  --    < > 26 27 28 29 29   GLUCOSE  --    < > 101* 122* 119* 106* 102*  BUN  --    < > 13 11 11 15 18   CREATININE  --    < > 0.64 0.72 0.71 0.71 0.89  CALCIUM  --    < > 9.1 8.8* 9.1 9.2 8.7*  MG  --    < > 1.2* 1.5* 1.5* 1.4* 1.8  PHOS 3.6  --  3.0  --   --   --  4.1   < > =  values in this interval not displayed.    Studies: No results found.  Scheduled Meds:  apixaban  5 mg Oral BID   bisacodyl  10 mg Oral QHS   cyanocobalamin  1,000 mcg Oral Daily   doxycycline  100 mg Oral BID   feeding supplement  237 mL Oral BID BM   linaclotide  290 mcg Oral Daily   magnesium chloride  1 tablet Oral BID   methylphenidate  20 mg Oral TID   midodrine  10 mg Oral TID WC   multivitamin with minerals  1 tablet Oral Daily   pantoprazole  40 mg Oral Daily   polyethylene glycol  17 g Oral BID   pregabalin  150 mg Oral BID   sorbitol, milk of mag, mineral oil, glycerin (SMOG) enema  400 mL Rectal BID   sucralfate  1 g Oral QID   Vitamin D (Ergocalciferol)  50,000 Units Oral Q7 days   Continuous Infusions:  sodium chloride 1,000 mL (09/25/23 0921)   PRN Meds: acetaminophen, albuterol, bisacodyl, diltiazem, famotidine, lactulose, methocarbamol, montelukast, ondansetron (ZOFRAN) IV, traMADol  Time spent: 35 minutes  Author: Gillis Santa.  MD [facility] 09/25/2023 1:39 PM  To reach On-call, see care teams to locate the attending and reach out to them via [email or web address]. If 7PM-7AM, please contact night-coverage If you still have difficulty reaching the attending provider, please page the [facility] (Director on Call) for [facility] on amion for assistance.

## 2023-09-25 NOTE — Plan of Care (Signed)
  Problem: Education: Goal: Knowledge of General Education information will improve Description Including pain rating scale, medication(s)/side effects and non-pharmacologic comfort measures Outcome: Progressing   

## 2023-09-25 NOTE — Progress Notes (Signed)
PHARMACY CONSULT NOTE - FOLLOW UP  Pharmacy Consult for Electrolyte Monitoring and Replacement   Recent Labs: Potassium (mmol/L)  Date Value  09/25/2023 4.2  01/19/2015 3.4 (L)   Magnesium (mg/dL)  Date Value  16/09/9603 1.8  01/19/2015 1.0 (L)   Calcium (mg/dL)  Date Value  54/08/8118 8.7 (L)   Calcium, Total (mg/dL)  Date Value  14/78/2956 8.3 (L)   Albumin (g/dL)  Date Value  21/30/8657 4.0  06/14/2014 4.0   Phosphorus (mg/dL)  Date Value  84/69/6295 4.1   Sodium (mmol/L)  Date Value  09/25/2023 139  01/19/2015 142     Assessment: 42 yo female presented by EMS due to palpatation, chest pain and abdominal pain.  In ED patient found to have hypokalemia and hypomagnesemia.  Goal of Therapy:  Electrolytes WNL   Plan:  Magnesium chloride 64mg  BID started 10/13 Continue oral magnesium chloride BID Recheck electrolytes with AM labs  Bettey Costa, PharmD Clinical Pharmacist 09/25/2023 7:44 AM

## 2023-09-26 ENCOUNTER — Inpatient Hospital Stay: Payer: Medicare HMO

## 2023-09-26 DIAGNOSIS — E878 Other disorders of electrolyte and fluid balance, not elsewhere classified: Secondary | ICD-10-CM | POA: Diagnosis not present

## 2023-09-26 LAB — BASIC METABOLIC PANEL
Anion gap: 9 (ref 5–15)
BUN: 16 mg/dL (ref 6–20)
CO2: 28 mmol/L (ref 22–32)
Calcium: 8.8 mg/dL — ABNORMAL LOW (ref 8.9–10.3)
Chloride: 103 mmol/L (ref 98–111)
Creatinine, Ser: 0.75 mg/dL (ref 0.44–1.00)
GFR, Estimated: >60 mL/min (ref >60)
Glucose, Bld: 103 mg/dL — ABNORMAL HIGH (ref 70–99)
Potassium: 3.5 mmol/L (ref 3.5–5.1)
Sodium: 140 mmol/L (ref 135–145)

## 2023-09-26 LAB — PHOSPHORUS: Phosphorus: 3.7 mg/dL (ref 2.5–4.6)

## 2023-09-26 LAB — MAGNESIUM: Magnesium: 1.3 mg/dL — ABNORMAL LOW (ref 1.7–2.4)

## 2023-09-26 MED ORDER — MAGNESIUM OXIDE -MG SUPPLEMENT 400 (240 MG) MG PO TABS
400.0000 mg | ORAL_TABLET | Freq: Two times a day (BID) | ORAL | Status: DC
  Administered 2023-09-27 – 2023-09-28 (×2): 400 mg via ORAL
  Filled 2023-09-26 (×3): qty 1

## 2023-09-26 MED ORDER — SODIUM CHLORIDE 0.9 % IV SOLN: INTRAVENOUS | Status: AC

## 2023-09-26 MED ORDER — POTASSIUM CHLORIDE 20 MEQ PO PACK
40.0000 meq | PACK | Freq: Once | ORAL | Status: AC
  Administered 2023-09-26: 40 meq via ORAL
  Filled 2023-09-26: qty 2

## 2023-09-26 MED ORDER — MAGNESIUM SULFATE 4 GM/100ML IV SOLN
4.0000 g | Freq: Once | INTRAVENOUS | Status: AC
  Administered 2023-09-26: 4 g via INTRAVENOUS
  Filled 2023-09-26 (×2): qty 100

## 2023-09-26 NOTE — Plan of Care (Signed)

## 2023-09-26 NOTE — Progress Notes (Signed)
PHARMACY CONSULT NOTE - FOLLOW UP  Pharmacy Consult for Electrolyte Monitoring and Replacement   Recent Labs: Potassium (mmol/L)  Date Value  09/26/2023 3.5  01/19/2015 3.4 (L)   Magnesium (mg/dL)  Date Value  16/09/9603 1.3 (L)  01/19/2015 1.0 (L)   Calcium (mg/dL)  Date Value  54/08/8118 8.8 (L)   Calcium, Total (mg/dL)  Date Value  14/78/2956 8.3 (L)   Albumin (g/dL)  Date Value  21/30/8657 4.0  06/14/2014 4.0   Phosphorus (mg/dL)  Date Value  84/69/6295 3.7   Sodium (mmol/L)  Date Value  09/26/2023 140  01/19/2015 142    Assessment: 42 yo female presented by EMS due to palpatation, chest pain and abdominal pain.  In ED patient found to have hypokalemia and hypomagnesemia.  Diet : Dys3 IVF: NS @ 97ml/hr x 24hrs started 10/13@0930  Relevant medication: Magnesium chloride 64mg  BID started 10/13  Goal of Therapy:  Electrolytes WNL  Plan:  K 3.5, replace with Kcl po x 1 dose Mg 1.3, replace with Mg Sulfate 4gm IV x 1 dose  Continue oral magnesium chloride BID Recheck BMET and Mg with AM labs  Veverly Larimer Rodriguez-Guzman PharmD, BCPS 09/26/2023 7:48 AM

## 2023-09-26 NOTE — TOC Progression Note (Signed)
Transition of Care [facility]) - Progression Note    Patient Details  Name: [name] MRN: [id number] Date of Birth: 12-Oct-1981  [facility] CM/SW Contact  Marlowe Sax, RN Phone Number: 09/26/2023, 9:46 AM  Clinical Narrative:     TOC continues to follow for needs, Will provde EMS at [place] to home, set up with [facility] for [facility]  Expected Discharge Plan: Home w Home Health Services Barriers to Discharge: No Barriers Identified  Expected Discharge Plan and Services In-house Referral: NA Discharge Planning Services: CM Consult   Living arrangements for the past 2 months: Single Family Home                 DME Arranged: N/A         HH Arranged: PT, OT, RN HH Agency: [facility] Date [facility] Agency Contacted: 09/21/23 Time HH Agency Contacted: 1025 Representative spoke with at [facility] Agency: [place]   Social Determinants of Health (SDOH) Interventions SDOH Screenings   Food Insecurity: No Food Insecurity (09/20/2023)  Housing: Low Risk  (09/20/2023)  Transportation Needs: No Transportation Needs (09/20/2023)  Utilities: Not At Risk (09/20/2023)  Financial Resource Strain: Low Risk  (07/12/2023)   Received from [facility]  Social Connections: Unknown (08/02/2022)   Received from [facility]  Stress: Stress Concern Present (07/20/2022)   Received from [facility], [facility]  Tobacco Use: Low Risk  (09/20/2023)    Readmission Risk Interventions     No data to display

## 2023-09-26 NOTE — Progress Notes (Signed)
[facility] Progress Note  Patient: [name]    ZOX:[id number]  DOA: 09/19/2023     Date of Service: the patient was seen and examined on 09/26/2023  Chief Complaint  Patient presents with   Chest Pain   Brief hospital course: 42 y.o. female with PMH of cerebral palsy, Gitelman syndrome (autosomal recessive  salt-losing (ie, salt-wasting) tubulopathies), hypokalemia, hypomagnesemia, RSD (reflex sympathetic dystrophy) also know as Complex regional pain syndrome (CRPS) , POTS (Postural tachycardia syndrome), SA node dysfunction status post pacemaker placement, DVT on Eliquis, who present with palpitation, constipation and abdominal pain.   Patient has history of cerebral palsy, it is very difficulty to communicate with her.  Per her mom (I called her mother by phone), patient has been constipated recently.  Patient has chronic lower abdominal pain.  She complains of right lower abdominal pain.  No nausea, vomiting.  No fever or chills.  Patient was seen in ED yesterday.  She had CT scan per renal stone protocol which showed large volume of stool throughout the colon, otherwise negative.  Patient received enema with improvement of her constipation, but still has abdominal pain.  Per her mother, patient has palpitation and some chest discomfort, no cough or shortness breath.  No symptoms of UTI. Of note, her PCP started her on Keflex on 10/3 for right forearm wound infection   10/8: Patient underwent enema with good result on 10/7.  Following day still endorsing abdominal pain    Assessment and Plan: Electrolyte disturbance including hypokalemia and hypomagnesemia:  Potassium 2.8, magnesium 1.4, phosphorus 3.6.  On admission.   Electrolytes have been corrected. Plan: Monitor electrolytes Repeat labs in a.m. Consulted pharmacist for electrolyte replacement   Constipation 10/9 change to MiraLAX twice daily, Dulcolax 10 mg nightly and Dulcolax suppository as needed Continued  Llinzess S/p enema, had small BM on 10/8 10/10 no BM for past 2 days, smog enema given today 10/10 CT a/p: No acute findings, still has large stool burden, but less as compared to prior CT scan, constipation improving. Continue MiraLAX twice daily and Dulcolax nightly. 10/12 patient feels fecal impaction, x-ray KUB: Large stool in the sigmoid colon and rectum S/p Dulcolax suppository was given on 10/12 10/13, started smog enema twice daily for 2 days Continue IV fluid for hydration as patient is having liquidy diarrhea and low blood pressure 10/14 AXR: 1. Small volume of colonic stool, diminished stool burden from prior exam. 2. Mild gaseous gastric distension, chronic. Patient did move bowels after enemas, x-ray shows improvement.  Abdominal pain, chronic, right lower quadrant:  Etiology is not clear.  May be related to constipation.   Patient had lipase 25  Constipation improving Plan: Home tramadol Home Lyrica   Palpitation:  Likely due to electrolytes disturbance.  Patient has some chest discomfort, but troponin negative x 2.    DVT (deep venous thrombosis)  -Eliquis   Hypotension, blood pressure remained soft. 10/11 started midodrine 10 mg p.o. 3 times daily with holding parameters Continue to monitor BP Essential hypertension:  Blood pressure soft to low.  Toprol-XL and propranolol are listed in the med rec, discontinued medications due to low blood pressure. Started Cardizem IR as needed for palpitations.    GERD without esophagitis -Protonix   Thrombocytopenia: Resolved This is chronic issue, platelet 179 on 09/23/23, improved, thrombocytopenia resolved. -Follow-up CBC   Protein-calorie malnutrition, moderate: Body weight 43.1 kg, BMI 17.38 -Ensure nutrition supplement -Consult to nutrition  Right wrist cellulitis, patient was on Keflex which causes  her itching so she refused.  Changed the antibiotics to doxycycline 100 mg p.o. twice daily for 5 days.  Follow  with PCP.  Vitamin D Insufficiency: started vitamin D 50,000 units p.o. weekly, follow with PCP to repeat vitamin D level after 3 to 6 months.   Body mass index is 16.13 kg/m.  Nutrition Problem: Moderate Malnutrition Etiology: chronic illness Interventions:  Diet: Dysphagia 3 diet DVT Prophylaxis: Therapeutic Anticoagulation with Eliquis    Advance goals of care discussion: Full code  Family Communication: family was not present at bedside, at the time of interview.  The pt provided permission to discuss medical plan with the family. Opportunity was given to ask question and all questions were answered satisfactorily.   Disposition:  Pt is from Home, admitted with constipation and electrolyte imbalance, still has electrolyte imbalance and constipation, which precludes a safe discharge. Discharge to home with home health services.discharge most likely tomorrow a.m.   Subjective: No significant events overnight, patient still has abdominal pain more in the upper side than lower, did have some liquidy stool couple of times but no big BM, patient was given enema and then she had a BM, x-ray shows improvement. We will plan to discharge tomorrow a.m.   Physical Exam: General: NAD, lying comfortably Appear in no distress, affect appropriate Eyes: PERRLA ENT: Oral Mucosa Clear, moist  Neck: no JVD,  Cardiovascular: S1 and S2 Present, no Murmur,  Respiratory: good respiratory effort, Bilateral Air entry equal and Decreased, no Crackles, no wheezes Abdomen: Bowel Sound present, Soft and mild generalized tenderness,  Skin: Right wrist cellulitis Extremities: no Pedal edema, no calf tenderness Neurologic: without any new focal findings Gait not checked due to patient safety concerns  Vitals:   09/26/23 0045 09/26/23 0440 09/26/23 0800 09/26/23 1300  BP: 93/67  114/60 108/74  Pulse: 86  67 85  Resp: 18  18 18   Temp: 98 F (36.7 C)  98.2 F (36.8 C) 98.2 F (36.8 C)  TempSrc:  Oral  Oral Oral  SpO2: 100%  97% 100%  Weight:  40 kg    Height:        Intake/Output Summary (Last 24 hours) at 09/26/2023 1557 Last data filed at 09/26/2023 1343 Gross per 24 hour  Intake 240 ml  Output --  Net 240 ml     Filed Weights   09/22/23 0500 09/23/23 0423 09/26/23 0440  Weight: 41 kg 41 kg 40 kg    Data Reviewed: I have personally reviewed and interpreted daily labs, tele strips, imagings as discussed above. I reviewed all nursing notes, pharmacy notes, vitals, pertinent old records I have discussed plan of care as described above with RN and patient/family.  CBC: Recent Labs  Lab 09/19/23 1718 09/20/23 0532 09/23/23 0534  WBC 5.2 4.0 5.5  HGB 11.1* 10.1* 11.5*  HCT 33.8* 30.4* 34.4*  MCV 79.7* 79.2* 79.4*  PLT 123* 117* 179   Basic Metabolic Panel: Recent Labs  Lab 09/19/23 1943 09/20/23 0532 09/21/23 4010 09/22/23 0527 09/23/23 0534 09/24/23 0730 09/25/23 0514 09/26/23 0424  NA  --    < > 141 138 140 138 139 140  K  --    < > 3.4* 3.6 3.5 4.0 4.2 3.5  CL  --    < > 103 102 101 100 101 103  CO2  --    < > 26 27 28 29 29 28   GLUCOSE  --    < > 101* 122* 119* 106* 102* 103*  BUN  --    < > 13 11 11 15 18 16   CREATININE  --    < > 0.64 0.72 0.71 0.71 0.89 0.75  CALCIUM  --    < > 9.1 8.8* 9.1 9.2 8.7* 8.8*  MG  --    < > 1.2* 1.5* 1.5* 1.4* 1.8 1.3*  PHOS 3.6  --  3.0  --   --   --  4.1 3.7   < > = values in this interval not displayed.    Studies: DG Abd 1 View  Result Date: 09/26/2023 CLINICAL DATA:  Constipation. EXAM: ABDOMEN - 1 VIEW COMPARISON:  Radiograph 2 days ago.  CT 09/22/2023 FINDINGS: Diminished stool burden from prior exam, small volume of stool in the left colon. No small bowel distension. There is mild gaseous gastric distension. Cholecystectomy clips in the right upper quadrant. Abdominal wall battery pack again seen. No other interval change. IMPRESSION: 1. Small volume of colonic stool, diminished stool burden from prior  exam. 2. Mild gaseous gastric distension, chronic. Electronically Signed   By: Narda Rutherford [name]   On: 09/26/2023 12:04    Scheduled Meds:  apixaban  5 mg Oral BID   bisacodyl  10 mg Oral QHS   cyanocobalamin  1,000 mcg Oral Daily   feeding supplement  237 mL Oral BID BM   linaclotide  290 mcg Oral Daily   [START ON 09/27/2023] magnesium oxide  400 mg Oral BID   methylphenidate  20 mg Oral TID   midodrine  10 mg Oral TID WC   multivitamin with minerals  1 tablet Oral Daily   pantoprazole  40 mg Oral Daily   polyethylene glycol  17 g Oral BID   pregabalin  150 mg Oral BID   sorbitol, milk of mag, mineral oil, glycerin (SMOG) enema  400 mL Rectal BID   sucralfate  1 g Oral QID   Vitamin D (Ergocalciferol)  50,000 Units Oral Q7 days   Continuous Infusions:  sodium chloride 75 mL/hr at 09/26/23 1534   PRN Meds: acetaminophen, albuterol, bisacodyl, diltiazem, famotidine, lactulose, methocarbamol, montelukast, ondansetron (ZOFRAN) IV, traMADol  Time spent: 35 minutes  Author: Gillis Santa. MD [facility] 09/26/2023 3:57 PM  To reach On-call, see care teams to locate the attending and reach out to them via [email or web address]. If 7PM-7AM, please contact night-coverage If you still have difficulty reaching the attending provider, please page the [facility] (Director on Call) for [facility] on amion for assistance.

## 2023-09-27 DIAGNOSIS — E878 Other disorders of electrolyte and fluid balance, not elsewhere classified: Secondary | ICD-10-CM | POA: Diagnosis not present

## 2023-09-27 LAB — BASIC METABOLIC PANEL
Anion gap: 9 (ref 5–15)
Anion gap: 10 (ref 5–15)
BUN: 11 mg/dL (ref 6–20)
BUN: 11 mg/dL (ref 6–20)
CO2: 28 mmol/L (ref 22–32)
CO2: 25 mmol/L (ref 22–32)
Calcium: 8.7 mg/dL — ABNORMAL LOW (ref 8.9–10.3)
Calcium: 8.9 mg/dL (ref 8.9–10.3)
Chloride: 101 mmol/L (ref 98–111)
Chloride: 102 mmol/L (ref 98–111)
Creatinine, Ser: 0.64 mg/dL (ref 0.44–1.00)
Creatinine, Ser: 0.66 mg/dL (ref 0.44–1.00)
GFR, Estimated: >60 mL/min (ref >60)
GFR, Estimated: >60 mL/min (ref >60)
Glucose, Bld: 96 mg/dL (ref 70–99)
Glucose, Bld: 151 mg/dL — ABNORMAL HIGH (ref 70–99)
Potassium: 3.1 mmol/L — ABNORMAL LOW (ref 3.5–5.1)
Potassium: 3.5 mmol/L (ref 3.5–5.1)
Sodium: 138 mmol/L (ref 135–145)
Sodium: 137 mmol/L (ref 135–145)

## 2023-09-27 LAB — MAGNESIUM
Magnesium: 1.6 mg/dL — ABNORMAL LOW (ref 1.7–2.4)
Magnesium: 1.9 mg/dL (ref 1.7–2.4)

## 2023-09-27 MED ORDER — POTASSIUM CHLORIDE 20 MEQ PO PACK
40.0000 meq | PACK | ORAL | Status: DC
  Administered 2023-09-27: 40 meq via ORAL
  Filled 2023-09-27: qty 2

## 2023-09-27 MED ORDER — PANTOPRAZOLE SODIUM 40 MG PO TBEC
40.0000 mg | DELAYED_RELEASE_TABLET | Freq: Two times a day (BID) | ORAL | Status: DC
  Administered 2023-09-27 – 2023-09-28 (×2): 40 mg via ORAL
  Filled 2023-09-27 (×2): qty 1

## 2023-09-27 MED ORDER — MAGNESIUM SULFATE 2 GM/50ML IV SOLN
2.0000 g | Freq: Once | INTRAVENOUS | Status: AC
  Administered 2023-09-27: 2 g via INTRAVENOUS
  Filled 2023-09-27: qty 50

## 2023-09-27 MED ORDER — POTASSIUM CHLORIDE CRYS ER 20 MEQ PO TBCR: 40.0000 meq | EXTENDED_RELEASE_TABLET | ORAL | Status: DC

## 2023-09-27 MED ORDER — POTASSIUM CHLORIDE 10 MEQ/100ML IV SOLN
10.0000 meq | INTRAVENOUS | Status: AC
  Administered 2023-09-27 (×4): 10 meq via INTRAVENOUS
  Filled 2023-09-27 (×3): qty 100

## 2023-09-27 MED ORDER — MAGNESIUM CHLORIDE 200 MG/ML IJ SOLN
4000.0000 mg | INTRAMUSCULAR | 11 refills | Status: DC
Start: 2023-09-29 — End: 2024-05-09

## 2023-09-27 MED ORDER — POTASSIUM CHLORIDE 20 MEQ PO PACK: 40.0000 meq | PACK | ORAL | Status: DC

## 2023-09-27 MED ORDER — BOOST / RESOURCE BREEZE PO LIQD CUSTOM
1.0000 | Freq: Three times a day (TID) | ORAL | Status: DC
  Administered 2023-09-27 (×3): 1 via ORAL

## 2023-09-27 NOTE — TOC Progression Note (Signed)
Transition of Care [facility]) - Progression Note    Patient Details  Name: [name] MRN: [id number] Date of Birth: 02/11/1981  [facility]) CM/SW Contact  Hetty Ely, RN Phone Number: 09/27/2023, 12:00 PM  Clinical Narrative: CM faxed Rx for home infusion to [phone] and notified HH [facility] that patient will discharge tomorrow, 09/28/23 and she will notify nurse.      Expected Discharge Plan: Home w Home Health Services Barriers to Discharge: No Barriers Identified  Expected Discharge Plan and Services In-house Referral: NA Discharge Planning Services: CM Consult   Living arrangements for the past 2 months: Single Family Home                 DME Arranged: N/A         HH Arranged: PT, OT, RN HH Agency: [facility] Date HH Agency Contacted: 09/21/23 Time HH Agency Contacted: 1025 Representative spoke with at [facility] Agency: [place]   Social Determinants of Health (SDOH) Interventions SDOH Screenings   Food Insecurity: No Food Insecurity (09/20/2023)  Housing: Low Risk  (09/20/2023)  Transportation Needs: No Transportation Needs (09/20/2023)  Utilities: Not At Risk (09/20/2023)  Financial Resource Strain: Low Risk  (07/12/2023)   Received from [facility]  Social Connections: Unknown (08/02/2022)   Received from [facility]  Stress: Stress Concern Present (07/20/2022)   Received from [facility], [facility]  Tobacco Use: Low Risk  (09/20/2023)    Readmission Risk Interventions     No data to display

## 2023-09-27 NOTE — Plan of Care (Signed)

## 2023-09-27 NOTE — Plan of Care (Signed)

## 2023-09-27 NOTE — Progress Notes (Signed)
PHARMACY CONSULT NOTE - FOLLOW UP  Pharmacy Consult for Electrolyte Monitoring and Replacement   Recent Labs: Potassium (mmol/L)  Date Value  09/27/2023 3.5  01/19/2015 3.4 (L)   Magnesium (mg/dL)  Date Value  09/81/1914 1.9  01/19/2015 1.0 (L)   Calcium (mg/dL)  Date Value  78/29/5621 8.9   Calcium, Total (mg/dL)  Date Value  30/86/5784 8.3 (L)   Albumin (g/dL)  Date Value  69/62/9528 4.0  06/14/2014 4.0   Phosphorus (mg/dL)  Date Value  41/32/4401 3.7   Sodium (mmol/L)  Date Value  09/27/2023 137  01/19/2015 142    Assessment: 42 yo female presented by EMS due to palpatation, chest pain and abdominal pain.  In ED patient found to have hypokalemia and hypomagnesemia.  Diet : Dys3 IVF: NS @ 46ml/hr x 48hrs started 10/13@0930  Relevant medication:  Mgchloride 64mg  BID started 10/13 -> changed to MgOx 400mg  BID on 10/15  Goal of Therapy:  Electrolytes WNL  Plan:  No additional supplementation needed at this time Continue oral magnesium oxide BID Recheck BMP and Mag with AM labs   Paulita Fujita, PharmD Clinical Pharmacist 09/27/2023 7:32 PM

## 2023-09-27 NOTE — Care Management Important Message (Signed)
Important Message  Patient Details  Name: [name] MRN: [id number] Date of Birth: 1981-09-08   Important Message Given:  N/A - LOS <3 / Initial given by admissions     Olegario Messier A Alexande Sheerin 09/27/2023, 9:48 AM

## 2023-09-27 NOTE — Progress Notes (Signed)
Nutrition Follow-up  DOCUMENTATION CODES:   Non-severe (moderate) malnutrition in context of chronic illness  INTERVENTION:   -Continue MVI with minerals daily -Continue dysphagia 3 diet -D/c Ensure Enlive po BID, each supplement provides 350 kcal and 20 grams of protein.  -Boost Breeze po TID, each supplement provides 250 kcal and 9 grams of protein  -Continue Magic cup TID with meals, each supplement provides 290 kcal and 9 grams of protein  -Continue 50,000 units vitamin D every 7 days for vitamin D deficiency  NUTRITION DIAGNOSIS:   Moderate Malnutrition related to chronic illness as evidenced by mild fat depletion, mild muscle depletion.  Ongoing  GOAL:   Patient will meet greater than or equal to 90% of their needs  Progressing   MONITOR:   PO intake, Supplement acceptance, Labs, Weight trends, I & O's, Skin  REASON FOR ASSESSMENT:   Consult Assessment of nutrition requirement/status  ASSESSMENT:   42 y/o female with h/o HTN, cerebral palsy, GERD, thrombocytopenia, spondylolysis, IBS, POTS, SA node dysfunction s/p pacemaker, RSD, Gitelman syndrome, chronic constipation and DVT who is admitted with electrolyte disturbance and abdominal pain.  Reviewed I/O's: +1.4 L x 24 hours and +2.8 L since admission  Pt just waking by to eat breakfast with nurse tech. Pt reports she slept well but is still sleepy. Alternative tray ordered as pt did not like breakfast provided, stating eggs "make my stomach hurt". Pt able to eat banana and orange juice with assistance of nurse tech. Noted meal completions documented at 25-75%. Pt has been refusing most Ensure supplements.    Wt has been stable over the past week.   Discussed importance of good meal and supplement intake to promote healing.   Per MD notes, potential to discharge home today. Plan for home with home health services per TOC.   Medications reviewed and include dulcolax, vitamin B-12, magnesium oxide, ritalin,  miralax, vitamin D, and 0.9% sodium chloride infusion @ 75 ml/hr.   Labs reviewed: Vitamin D: 23.29 (09/21/23)- supplemented with 50,000 units vitamin D daily.    Diet Order:   Diet Order             DIET DYS 3 Room service appropriate? No; Fluid consistency: Thin  Diet effective now                   EDUCATION NEEDS:   Education needs have been addressed  Skin:  Skin Assessment: Reviewed RN Assessment  Last BM:  09/27/23  Height:   Ht Readings from Last 1 Encounters:  09/19/23 5\' 2"  (1.575 m)    Weight:   Wt Readings from Last 1 Encounters:  09/27/23 40.5 kg    Ideal Body Weight:  50 kg  BMI:  Body mass index is 16.33 kg/m.  Estimated Nutritional Needs:   Kcal:  1400-1600  Protein:  70-85 grams  Fluid:  > 1.4 L    Levada Schilling, RD, LDN, CDCES Registered Dietitian III Certified Diabetes Care and Education Specialist Please refer to [facility] for RD and/or RD on-call/weekend/after hours pager

## 2023-09-27 NOTE — Progress Notes (Signed)
[facility] Progress Note  Patient: MABELINE VARAS    NGE:[id number]  DOA: 09/19/2023     Date of Service: the patient was seen and examined on 09/27/2023  Chief Complaint  Patient presents with   Chest Pain   Brief hospital course: 42 y.o. female with PMH of cerebral palsy, Gitelman syndrome (autosomal recessive  salt-losing (ie, salt-wasting) tubulopathies), hypokalemia, hypomagnesemia, RSD (reflex sympathetic dystrophy) also know as Complex regional pain syndrome (CRPS) , POTS (Postural tachycardia syndrome), SA node dysfunction status post pacemaker placement, DVT on Eliquis, who present with palpitation, constipation and abdominal pain.   Patient has history of cerebral palsy, it is very difficulty to communicate with her.  Per her mom (I called her mother by phone), patient has been constipated recently.  Patient has chronic lower abdominal pain.  She complains of right lower abdominal pain.  No nausea, vomiting.  No fever or chills.  Patient was seen in ED yesterday.  She had CT scan per renal stone protocol which showed large volume of stool throughout the colon, otherwise negative.  Patient received enema with improvement of her constipation, but still has abdominal pain.  Per her mother, patient has palpitation and some chest discomfort, no cough or shortness breath.  No symptoms of UTI. Of note, her PCP started her on Keflex on 10/3 for right forearm wound infection   10/8: Patient underwent enema with good result on 10/7.  Following day still endorsing abdominal pain    Assessment and Plan: Electrolyte disturbance including hypokalemia and hypomagnesemia:  Potassium 2.8, magnesium 1.4, phosphorus 3.6.  On admission.   Electrolytes are being replaced Plan: Monitor electrolytes Consulted pharmacist for electrolyte replacement   Constipation 10/9 change to MiraLAX twice daily, Dulcolax 10 mg nightly and Dulcolax suppository as needed Continued Llinzess S/p enema, had small  BM on 10/8 10/10 no BM for past 2 days, smog enema given today 10/10 CT a/p: No acute findings, still has large stool burden, but less as compared to prior CT scan, constipation improving. Continue MiraLAX twice daily and Dulcolax nightly. 10/12 patient feels fecal impaction, x-ray KUB: Large stool in the sigmoid colon and rectum S/p Dulcolax suppository was given on 10/12 10/13, started smog enema twice daily for 2 days Continue IV fluid for hydration as patient is having liquidy diarrhea and low blood pressure 10/14 AXR: 1. Small volume of colonic stool, diminished stool burden from prior exam. 2. Mild gaseous gastric distension, chronic.  Patient did move bowels after enemas, x-ray shows improvement.  Abdominal pain, chronic, right lower quadrant:  Etiology is not clear.  May be related to constipation.   Patient had lipase 25  Constipation improving Plan: Home tramadol Home Lyrica  Palpitation:  Likely due to electrolytes disturbance.  Patient has some chest discomfort, but troponin negative x 2.    DVT (deep venous thrombosis)  -Eliquis   Hypotension, blood pressure remained soft. 10/11 started midodrine 10 mg p.o. 3 times daily with holding parameters Continue to monitor BP Essential hypertension:  Blood pressure soft to low.  Toprol-XL and propranolol are listed in the med rec, discontinued medications due to low blood pressure. Started Cardizem IR as needed for palpitations.    GERD without esophagitis Increased to pantoprazole 40 mg p.o. twice daily   Thrombocytopenia: Resolved This is chronic issue, platelet 179 on 09/23/23, improved, thrombocytopenia resolved. -Follow-up CBC   Protein-calorie malnutrition, moderate: Body weight 43.1 kg, BMI 17.38 -Ensure nutrition supplement -Consult to nutrition  Right wrist cellulitis, patient was on  Keflex which causes her itching so she refused.  Changed the antibiotics to doxycycline 100 mg p.o. twice daily for 5 days.   Follow with PCP.  Vitamin D Insufficiency: started vitamin D 50,000 units p.o. weekly, follow with PCP to repeat vitamin D level after 3 to 6 months.   Body mass index is 16.33 kg/m.  Nutrition Problem: Moderate Malnutrition Etiology: chronic illness Interventions:  Diet: Dysphagia 3 diet DVT Prophylaxis: Therapeutic Anticoagulation with Eliquis    Advance goals of care discussion: Full code  Family Communication: family was not present at bedside, at the time of interview.  The pt provided permission to discuss medical plan with the family. Opportunity was given to ask question and all questions were answered satisfactorily.   Disposition:  Pt is from Home, admitted with constipation and electrolyte imbalance, still has electrolyte imbalance and constipation, which precludes a safe discharge. Discharge to home with home health services.discharge most likely tomorrow a.m.   Subjective: No significant events overnight, patient is complaining of pain in the epigastric area, feels acid reflux.  Patient is moving bowels but associates feels that he is constipated. Will continue current treatment, replete electrolytes today, potassium is very low.  Plan is to discharge her tomorrow a.m.   Physical Exam: General: NAD, lying comfortably Appear in mild distress, affect depressed Eyes: PERRLA ENT: Oral Mucosa Clear, moist  Neck: no JVD,  Cardiovascular: S1 and S2 Present, no Murmur,  Respiratory: good respiratory effort, Bilateral Air entry equal and Decreased, no Crackles, no wheezes Abdomen: Bowel Sound present, Soft and mild generalized tenderness,  Skin: Right wrist cellulitis Extremities: no Pedal edema, no calf tenderness Neurologic: without any new focal findings Gait not checked due to patient safety concerns  Vitals:   09/26/23 2254 09/27/23 0424 09/27/23 0506 09/27/23 0733  BP: 90/61   98/72  Pulse: 86   85  Resp: 16   15  Temp: 98.3 F (36.8 C)   97.6 F (36.4 C)   TempSrc:    Oral  SpO2: 100%   99%  Weight:  40 kg 40.5 kg   Height:        Intake/Output Summary (Last 24 hours) at 09/27/2023 1412 Last data filed at 09/27/2023 1348 Gross per 24 hour  Intake 1271.25 ml  Output --  Net 1271.25 ml     Filed Weights   09/26/23 0440 09/27/23 0424 09/27/23 0506  Weight: 40 kg 40 kg 40.5 kg    Data Reviewed: I have personally reviewed and interpreted daily labs, tele strips, imagings as discussed above. I reviewed all nursing notes, pharmacy notes, vitals, pertinent old records I have discussed plan of care as described above with RN and patient/family.  CBC: Recent Labs  Lab 09/23/23 0534  WBC 5.5  HGB 11.5*  HCT 34.4*  MCV 79.4*  PLT 179   Basic Metabolic Panel: Recent Labs  Lab 09/21/23 0642 09/22/23 0527 09/23/23 0534 09/24/23 0730 09/25/23 0514 09/26/23 0424 09/27/23 0548  NA 141   < > 140 138 139 140 138  K 3.4*   < > 3.5 4.0 4.2 3.5 3.1*  CL 103   < > 101 100 101 103 101  CO2 26   < > 28 29 29 28 28   GLUCOSE 101*   < > 119* 106* 102* 103* 96  BUN 13   < > 11 15 18 16 11   CREATININE 0.64   < > 0.71 0.71 0.89 0.75 0.64  CALCIUM 9.1   < > 9.1  9.2 8.7* 8.8* 8.7*  MG 1.2*   < > 1.5* 1.4* 1.8 1.3* 1.6*  PHOS 3.0  --   --   --  4.1 3.7  --    < > = values in this interval not displayed.    Studies: No results found.  Scheduled Meds:  apixaban  5 mg Oral BID   bisacodyl  10 mg Oral QHS   cyanocobalamin  1,000 mcg Oral Daily   feeding supplement  1 Container Oral TID BM   linaclotide  290 mcg Oral Daily   magnesium oxide  400 mg Oral BID   methylphenidate  20 mg Oral TID   midodrine  10 mg Oral TID WC   multivitamin with minerals  1 tablet Oral Daily   pantoprazole  40 mg Oral BID   polyethylene glycol  17 g Oral BID   pregabalin  150 mg Oral BID   sucralfate  1 g Oral QID   Vitamin D (Ergocalciferol)  50,000 Units Oral Q7 days   Continuous Infusions:  potassium chloride 10 mEq (09/27/23 1219)   PRN Meds:  acetaminophen, albuterol, bisacodyl, diltiazem, famotidine, lactulose, methocarbamol, montelukast, ondansetron (ZOFRAN) IV, traMADol  Time spent: 35 minutes  Author: Gillis Santa. MD [facility] 09/27/2023 2:12 PM  To reach On-call, see care teams to locate the attending and reach out to them via [email or web address]. If 7PM-7AM, please contact night-coverage If you still have difficulty reaching the attending provider, please page the [facility] (Director on Call) for [facility] on amion for assistance.

## 2023-09-27 NOTE — Progress Notes (Addendum)
PHARMACY CONSULT NOTE - FOLLOW UP  Pharmacy Consult for Electrolyte Monitoring and Replacement   Recent Labs: Potassium (mmol/L)  Date Value  09/27/2023 3.1 (L)  01/19/2015 3.4 (L)   Magnesium (mg/dL)  Date Value  37/16/9678 1.6 (L)  01/19/2015 1.0 (L)   Calcium (mg/dL)  Date Value  93/81/0175 8.7 (L)   Calcium, Total (mg/dL)  Date Value  10/06/8526 8.3 (L)   Albumin (g/dL)  Date Value  78/24/2353 4.0  06/14/2014 4.0   Phosphorus (mg/dL)  Date Value  61/44/3154 3.7   Sodium (mmol/L)  Date Value  09/27/2023 138  01/19/2015 142    Assessment: 42 yo female presented by EMS due to palpatation, chest pain and abdominal pain.  In ED patient found to have hypokalemia and hypomagnesemia.  Diet : Dys3 IVF: NS @ 31ml/hr x 48hrs started 10/13@0930  Relevant medication:  Mgchloride 64mg  BID started 10/13 -> changed to MgOx 400mg  BID on 10/15  Goal of Therapy:  Electrolytes WNL  Plan:  K 3.1, replace with Kcl po q2hrs x 2 dose Mg 1.6, replace with Mg Sulfate 2gm IV x 1 dose  Continue oral magnesium oxide BID Recheck BMET  AM labs  Katye Valek Rodriguez-Guzman PharmD, BCPS 09/27/2023 7:29 AM   Addendum 09/27/23 @ 1133 Patient refusing po Kcl. MD ordered Kcl IV x 4 - last dose due at 1600. Will check K and Mg at 1900 per MD request. Aggressive replacement. Plan to discharge tomorrow AM.  Meriel Pica Rodriguez-Guzman PharmD, BCPS 09/27/2023 11:34 AM

## 2023-09-28 DIAGNOSIS — E878 Other disorders of electrolyte and fluid balance, not elsewhere classified: Secondary | ICD-10-CM | POA: Diagnosis not present

## 2023-09-28 LAB — BASIC METABOLIC PANEL
Anion gap: 7 (ref 5–15)
BUN: 12 mg/dL (ref 6–20)
CO2: 27 mmol/L (ref 22–32)
Calcium: 9.1 mg/dL (ref 8.9–10.3)
Chloride: 102 mmol/L (ref 98–111)
Creatinine, Ser: 0.55 mg/dL (ref 0.44–1.00)
GFR, Estimated: >60 mL/min (ref >60)
Glucose, Bld: 115 mg/dL — ABNORMAL HIGH (ref 70–99)
Potassium: 3.4 mmol/L — ABNORMAL LOW (ref 3.5–5.1)
Sodium: 136 mmol/L (ref 135–145)

## 2023-09-28 LAB — PHOSPHORUS: Phosphorus: 3.6 mg/dL (ref 2.5–4.6)

## 2023-09-28 LAB — MAGNESIUM: Magnesium: 1.7 mg/dL (ref 1.7–2.4)

## 2023-09-28 MED ORDER — MAGNESIUM SULFATE 2 GM/50ML IV SOLN
2.0000 g | Freq: Once | INTRAVENOUS | Status: AC
  Administered 2023-09-28: 2 g via INTRAVENOUS
  Filled 2023-09-28: qty 50

## 2023-09-28 MED ORDER — BISACODYL 5 MG PO TBEC: 10.0000 mg | DELAYED_RELEASE_TABLET | Freq: Two times a day (BID) | ORAL | 11 refills | Status: DC

## 2023-09-28 MED ORDER — POTASSIUM CHLORIDE CRYS ER 20 MEQ PO TBCR
40.0000 meq | EXTENDED_RELEASE_TABLET | Freq: Once | ORAL | Status: AC
  Administered 2023-09-28: 40 meq via ORAL
  Filled 2023-09-28: qty 2

## 2023-09-28 MED ORDER — MIDODRINE HCL 10 MG PO TABS: 10.0000 mg | ORAL_TABLET | Freq: Three times a day (TID) | ORAL | 5 refills | Status: AC

## 2023-09-28 MED ORDER — SODIUM CHLORIDE 0.9 % IV BOLUS
500.0000 mL | Freq: Once | INTRAVENOUS | Status: AC
  Administered 2023-09-28: 500 mL via INTRAVENOUS

## 2023-09-28 MED ORDER — POLYETHYLENE GLYCOL 3350 17 G PO PACK: 17.0000 g | PACK | Freq: Two times a day (BID) | ORAL | 3 refills | Status: DC

## 2023-09-28 MED ORDER — BISACODYL 5 MG PO TBEC: 10.0000 mg | DELAYED_RELEASE_TABLET | Freq: Every day | ORAL | 11 refills | Status: DC

## 2023-09-28 MED ORDER — BISACODYL 10 MG RE SUPP: 10.0000 mg | Freq: Every day | RECTAL | 0 refills | Status: DC | PRN

## 2023-09-28 MED ORDER — VITAMIN D (ERGOCALCIFEROL) 1.25 MG (50000 UNIT) PO CAPS: 50000.0000 [IU] | ORAL_CAPSULE | ORAL | 0 refills | Status: DC

## 2023-09-28 MED ORDER — POTASSIUM CHLORIDE 20 MEQ PO PACK
40.0000 meq | PACK | Freq: Once | ORAL | Status: DC
  Filled 2023-09-28: qty 2

## 2023-09-28 MED ORDER — ACETAMINOPHEN 325 MG PO TABS: 650.0000 mg | ORAL_TABLET | Freq: Four times a day (QID) | ORAL | Status: AC | PRN

## 2023-09-28 MED ORDER — POTASSIUM CHLORIDE 20 MEQ PO PACK: 20.0000 meq | PACK | Freq: Once | ORAL | Status: DC

## 2023-09-28 NOTE — Plan of Care (Signed)

## 2023-09-28 NOTE — Discharge Summary (Signed)
[facility] Discharge Summary   Patient: [name] WUJ:[id number]  PCP: Dione Housekeeper, MD  Date of admission: 09/19/2023   Date of discharge:  09/28/2023     Discharge Diagnoses:  Principal Problem:   Electrolyte disturbance Active Problems:   Hypomagnesemia   Hypokalemia   Abdominal pain, chronic, right lower quadrant   Palpitation   Constipation   DVT (deep venous thrombosis) (HCC)   Essential hypertension   GERD without esophagitis   Thrombocytopenia (HCC)   Protein-calorie malnutrition, moderate (HCC)   Admitted From: Home Disposition:  Home with [facility] services  Recommendations for Outpatient Follow-up:  F/u PCP in 1 wk, monitor BP and HR, f/u pcp to titrate medication accordingly Follow up LABS/TEST:  Repeat BMP, Mag in 1 wk   Follow-up Information     Dione Housekeeper, MD Follow up on 10/11/2023.   Specialty: Family Medicine Why: Follow-up Tuesday Oct.29th @ 1:40 pm. Patients arrival time 1:25pm Contact information: [address] [place] [address] [phone]                Diet recommendation: Regular diet  Activity: The patient is advised to gradually reintroduce usual activities, as tolerated  Discharge Condition: stable  Code Status: Full code   History of present illness: As per the H and P dictated on admission Hospital Course:  42 y.o. female with PMH of cerebral palsy, Gitelman syndrome (autosomal recessive  salt-losing (ie, salt-wasting) tubulopathies), hypokalemia, hypomagnesemia, RSD (reflex sympathetic dystrophy) also know as Complex regional pain syndrome (CRPS) , POTS (Postural tachycardia syndrome), SA node dysfunction status post pacemaker placement, DVT on Eliquis, who present with palpitation, constipation and abdominal pain.   Patient has history of cerebral palsy, it is very difficulty to communicate with her.  Per her mom (I called her mother by phone), patient has been constipated recently.  Patient  has chronic lower abdominal pain.  She complains of right lower abdominal pain.  No nausea, vomiting.  No fever or chills.  Patient was seen in ED yesterday.  She had CT scan per renal stone protocol which showed large volume of stool throughout the colon, otherwise negative.  Patient received enema with improvement of her constipation, but still has abdominal pain.  Per her mother, patient has palpitation and some chest discomfort, no cough or shortness breath.  No symptoms of UTI. Of note, her PCP started her on Keflex on 10/3 for right forearm wound infection    Assessment and Plan:  # Electrolyte disturbance including hypokalemia and hypomagnesemia:  Potassium 2.8, magnesium 1.4, phosphorus 3.6.  On admission.   Electrolytes were replaced accordingly.  consulted pharmacist for electrolyte replacement. Monitor electrolytes, repeat BMP and magnesium after 1 week and continue magnesium IV infusion at home.   # Constipation 10/9 change to MiraLAX twice daily, Dulcolax 10 mg nightly and Dulcolax suppository as needed. Continued Llinzess S/p enema, had small BM on 10/8 10/10 no BM for past 2 days, smog enema given today 10/10 CT a/p: No acute findings, still has large stool burden, but less as compared to prior CT scan, constipation improving. Continue MiraLAX twice daily and Dulcolax nightly. 10/12 patient feels fecal impaction, x-ray KUB: Large stool in the sigmoid colon and rectum. S/p Dulcolax suppository was given on 10/12 10/13, started smog enema twice daily for 2 days Continue IV fluid for hydration as patient is having liquidy diarrhea and low blood pressure 10/14 AXR: 1. Small volume of colonic stool, diminished stool burden from prior exam. 2.  Mild gaseous gastric distension, chronic.  Patient did move bowels after enemas, x-ray shows improvement. 10/16 continue MiraLAX twice daily, Dulcolax 10 mg p.o. twice daily followed by Dulcolax suppository as needed and enema as needed.  #  Abdominal pain, chronic, right lower quadrant:  Etiology is not clear.  May be related to constipation and GERD  Patient had lipase 25, Constipation improved.  Continue as needed medication for pain control, continue pantoprazole 40 mg p.o. daily and Pepcid 40 mg p.o. nightly as needed home dose. # Palpitation: Likely due to electrolytes disturbance.  Patient has some chest discomfort, but troponin negative x 2. # DVT (deep venous thrombosis) continue Eliquis # Hypotension, blood pressure remained soft. 10/11 started midodrine 10 mg p.o. 3 times daily with holding parameters Continue to monitor BP and titrate medication accordingly. # Essential hypertension: Blood pressure soft to low.  Discontinued propranolol due to low blood pressure. S/p Cardizem IR PRN for palpitations, but pt did not need it.  Continue to monitor. #GERD without esophagitis, s/p pantoprazole 40 mg p.o. twice daily during hospital stay, resumed pantoprazole 40 mg p.o. daily and Pepcid 40 mg p.o. nightly as needed home dose.  Avoided excessive dose of pantoprazole due to hypomagnesemia. # Thrombocytopenia: Resolved This is chronic issue, platelet 179 on 09/23/23, improved, thrombocytopenia resolved.   # Protein-calorie malnutrition, moderate: Body weight 43.1 kg, BMI 17.38 -Ensure nutrition supplement. Consulted nutritionist # Right wrist cellulitis, patient was on Keflex which causes her itching so she refused.  Changed the antibiotics to doxycycline 100 mg p.o. twice daily for 5 days.  Completed course.  Follow with PCP. # Vitamin D Insufficiency: started vitamin D 50,000 units p.o. weekly, follow with PCP to repeat vitamin D level after 3 to 6 months.   Body mass index is 16.53 kg/m.  Nutrition Problem: Moderate Malnutrition Etiology: chronic illness Nutrition Interventions:   On the day of the discharge the patient's vitals were stable, and no other acute medical condition were reported by patient. the patient was felt  safe to be discharge at Home with Home health.  Consultants: None Procedures: None  Discharge Exam: General: Appear in no distress, no Rash; Oral Mucosa Clear, moist. Cardiovascular: S1 and S2 Present, no Murmur, Respiratory: normal respiratory effort, Bilateral Air entry present and no Crackles, no wheezes Abdomen: Bowel Sound present, Soft and no tenderness, no hernia Extremities: no Pedal edema, no calf tenderness Neurology: alert and oriented to time, place, and person.  Contracted bilateral upper and lower extremities at baseline.  Walks on her knees at home. affect appropriate.  Filed Weights   09/27/23 0424 09/27/23 0506 09/28/23 0500  Weight: 40 kg 40.5 kg 41 kg   Vitals:   09/28/23 1002 09/28/23 1146  BP: 110/69 117/81  Pulse: 85 89  Resp:    Temp:    SpO2:      DISCHARGE MEDICATION: Allergies as of 09/28/2023       Reactions   Duloxetine    Other reaction(s): Other (See Comments), Palpitations Other reaction(s): Hallucinations, Hyperactive behavior (finding), Other (See Comments) Stressed out   Flecainide    Other reaction(s): Other (See Comments) Other reaction(s): Hypomagnesemia (disorder), Other (See Comments) Other Reaction: QUESTION HYPOMAGNESEMIA   Fluoxetine    Other reaction(s): Nausea And Vomiting, Other (See Comments) Other reaction(s): Other (See Comments) Other Reaction: INCREASED HR  causes irritability and irrational behavior "I go crazy"   Gabapentin    Goes crazy Other reaction(s): Other (See Comments) Other reaction(s): Hyperactive behavior (finding), Mental Status  Changes (intolerance), Other (See Comments) Goes crazy Hallucinations   Baclofen    Other reaction(s): Other (See Comments), Other (See Comments) Weakness  Other reaction(s): Other (See Comments) Weakness and loose Weakness  Other reaction(s):  Weakness    Cephalosporins    Other reaction(s): Hives, Rash Received pre-op vancomycin and cefazolin 07/20/16. Developed  urticarial rash on bilateral lower extremities + slight periorbital swelling. Treated with diphenhydramine. Received pre-op vancomycin and cefazolin 07/20/16. Developed urticarial rash on bilateral lower extremities + slight periorbital swelling. Treated with diphenhydramine. Received pre-op vancomycin and cefazolin 07/20/16. Developed urticarial rash on bilateral lower extremities + slight periorbital swelling. Treated with diphenhydramine.   Sulfasalazine    Other reaction(s): Nausea And Vomiting   Sulfa Antibiotics Nausea And Vomiting   Vancomycin    Other reaction(s): Hives Received pre-op vancomycin and cefazolin 07/20/16. Developed urticarial rash on bilateral lower extremities + slight periorbital swelling. Treated with diphenhydramine. Received pre-op vancomycin and cefazolin 07/20/16. Developed urticarial rash on bilateral lower extremities + slight periorbital swelling. Treated with diphenhydramine. Received pre-op vancomycin and cefazolin 07/20/16. Developed urticarial rash on bilateral lower extremities + slight periorbital swelling. Treated with diphenhydramine.   Cefazolin Rash   Tolerated ceftriaxone 2023 and cephalexin 2022        Medication List     STOP taking these medications    cephALEXin 250 MG capsule Commonly known as: KEFLEX   lactulose 10 GM/15ML solution Commonly known as: CHRONULAC   propranolol 10 MG tablet Commonly known as: INDERAL       TAKE these medications    acetaminophen 325 MG tablet Commonly known as: TYLENOL Take 2 tablets (650 mg total) by mouth every 6 (six) hours as needed for mild pain (pain score 1-3) or fever.   bisacodyl 10 MG suppository Commonly known as: DULCOLAX Place 1 suppository (10 mg total) rectally daily as needed for severe constipation.   bisacodyl 5 MG EC tablet Commonly known as: DULCOLAX Take 2 tablets (10 mg total) by mouth 2 (two) times daily.   cyanocobalamin 1000 MCG tablet Commonly known as: VITAMIN B12 Take  1,000 mcg by mouth daily.   D2000 Ultra Strength 50 MCG (2000 UT) Caps Generic drug: Cholecalciferol Take 1 tablet by mouth daily.   Eliquis 5 MG Tabs tablet Generic drug: apixaban Take 5 mg by mouth 2 (two) times daily.   famotidine 40 MG tablet Commonly known as: PEPCID Take 1 tablet (40 mg total) by mouth at bedtime as needed for heartburn. What changed: when to take this   heparin lock flush 100 UNIT/ML Soln injection Inject 5 mL (500 Units total) as directed as needed for line care. Flush using SASH (saline, administer  IV, saline, heparin) method as directed. Flush IV catheter with heparin after the last saline flush.  Use syringe ONE TIME only then discard. Storage: Room Temperature.   Linzess 290 MCG Caps capsule Generic drug: linaclotide Take 290 mcg by mouth daily.   magnesium 84 MG ( ) Tbcr SR tablet Commonly known as: MAGTAB Take 936 mg by mouth 3 (three) times daily.   magnesium chloride 200 MG/ML Soln Inject 20 mLs (4,000 mg total) as directed 2 (two) times a week. 4gm MgSulfate on 1000 ml LR per home infusion Start taking on: September 29, 2023 What changed: medication strength   methocarbamol 500 MG tablet Commonly known as: ROBAXIN Take 500-1,000 mg by mouth every 6 (six) hours as needed (for back spasms).   metoCLOPramide 10 MG tablet Commonly known as: REGLAN Take 1 tablet (  10 mg total) by mouth every 8 (eight) hours as needed for up to 3 days for nausea or vomiting.   midodrine 10 MG tablet Commonly known as: PROAMATINE Take 1 tablet (10 mg total) by mouth 3 (three) times daily with meals. Hold if Systolic BP >110   montelukast 10 MG tablet Commonly known as: SINGULAIR Take 10 mg by mouth daily as needed (allergies).   pantoprazole 40 MG tablet Commonly known as: PROTONIX Take 40 mg by mouth daily.   polyethylene glycol 17 g packet Commonly known as: MIRALAX / GLYCOLAX Take 17 g by mouth 2 (two) times daily.   potassium chloride SA 20 MEQ  tablet Commonly known as: KLOR-CON M Take 20 mEq by mouth 2 (two) times daily.   pregabalin 150 MG capsule Commonly known as: LYRICA Take 150 mg by mouth in the morning, at noon, and at bedtime.   Premarin vaginal cream Generic drug: conjugated estrogens Place 0.5 g vaginally 2 (two) times a week.   Ritalin 20 MG tablet Generic drug: methylphenidate Take 20 mg by mouth 3 (three) times daily. Take 1 tablet (20 mg) TID at 0800, 1200 & 2100-2200   sucralfate 1 g tablet Commonly known as: CARAFATE Take 1 g by mouth 4 (four) times daily.   traMADol 50 MG tablet Commonly known as: ULTRAM Take 50 mg by mouth every 6 (six) hours as needed for moderate pain.   Vitamin D (Ergocalciferol) 1.25 MG (50000 UNIT) Caps capsule Commonly known as: DRISDOL Take 1 capsule (50,000 Units total) by mouth every 7 (seven) days. Start taking on: September 29, 2023       Allergies  Allergen Reactions   Duloxetine     Other reaction(s): Other (See Comments), Palpitations Other reaction(s): Hallucinations, Hyperactive behavior (finding), Other (See Comments) Stressed out     Flecainide     Other reaction(s): Other (See Comments) Other reaction(s): Hypomagnesemia (disorder), Other (See Comments) Other Reaction: QUESTION HYPOMAGNESEMIA     Fluoxetine     Other reaction(s): Nausea And Vomiting, Other (See Comments) Other reaction(s): Other (See Comments) Other Reaction: INCREASED HR  causes irritability and irrational behavior "I go crazy"   Gabapentin     Goes crazy Other reaction(s): Other (See Comments) Other reaction(s): Hyperactive behavior (finding), Mental Status Changes (intolerance), Other (See Comments) Goes crazy Hallucinations   Baclofen     Other reaction(s): Other (See Comments), Other (See Comments) Weakness  Other reaction(s): Other (See Comments) Weakness and loose Weakness  Other reaction(s):  Weakness     Cephalosporins     Other reaction(s): Hives, Rash Received  pre-op vancomycin and cefazolin 07/20/16. Developed urticarial rash on bilateral lower extremities + slight periorbital swelling. Treated with diphenhydramine. Received pre-op vancomycin and cefazolin 07/20/16. Developed urticarial rash on bilateral lower extremities + slight periorbital swelling. Treated with diphenhydramine. Received pre-op vancomycin and cefazolin 07/20/16. Developed urticarial rash on bilateral lower extremities + slight periorbital swelling. Treated with diphenhydramine.   Sulfasalazine     Other reaction(s): Nausea And Vomiting   Sulfa Antibiotics Nausea And Vomiting   Vancomycin     Other reaction(s): Hives Received pre-op vancomycin and cefazolin 07/20/16. Developed urticarial rash on bilateral lower extremities + slight periorbital swelling. Treated with diphenhydramine. Received pre-op vancomycin and cefazolin 07/20/16. Developed urticarial rash on bilateral lower extremities + slight periorbital swelling. Treated with diphenhydramine. Received pre-op vancomycin and cefazolin 07/20/16. Developed urticarial rash on bilateral lower extremities + slight periorbital swelling. Treated with diphenhydramine.   Cefazolin Rash    Tolerated  ceftriaxone 2023 and cephalexin 2022   Discharge Instructions     Call MD for:  difficulty breathing, headache or visual disturbances   Complete by: As directed    Call MD for:  extreme fatigue   Complete by: As directed    Call MD for:  persistant dizziness or light-headedness   Complete by: As directed    Call MD for:  persistant nausea and vomiting   Complete by: As directed    Call MD for:  severe uncontrolled pain   Complete by: As directed    Diet - low sodium heart healthy   Complete by: As directed    Discharge instructions   Complete by: As directed    F/u PCP in 1 wk, monitor BP and HR, f/u pcp to titrate medication accordingly Repeat BMP, Mag in 1 wk   Increase activity slowly   Complete by: As directed        The results of  significant diagnostics from this hospitalization (including imaging, microbiology, ancillary and laboratory) are listed below for reference.    Significant Diagnostic Studies: DG Abd 1 View  Result Date: 09/26/2023 CLINICAL DATA:  Constipation. EXAM: ABDOMEN - 1 VIEW COMPARISON:  Radiograph 2 days ago.  CT 09/22/2023 FINDINGS: Diminished stool burden from prior exam, small volume of stool in the left colon. No small bowel distension. There is mild gaseous gastric distension. Cholecystectomy clips in the right upper quadrant. Abdominal wall battery pack again seen. No other interval change. IMPRESSION: 1. Small volume of colonic stool, diminished stool burden from prior exam. 2. Mild gaseous gastric distension, chronic. Electronically Signed   By: Narda Rutherford [name]   On: 09/26/2023 12:04   DG Abd 1 View  Result Date: 09/24/2023 CLINICAL DATA:  Fecal impaction at the rectum. EXAM: ABDOMEN - 1 VIEW COMPARISON:  One-view abdomen 09/20/2023. FINDINGS: Mild gaseous distension of colon is noted. Moderate stool is present in the distal sigmoid colon and rectum. No free air is present. Axial skeleton is unremarkable. IMPRESSION: Moderate stool in the distal sigmoid colon and rectum. Electronically Signed   By: Marin Roberts [name]   On: 09/24/2023 15:07   CT ABDOMEN PELVIS W CONTRAST  Result Date: 09/22/2023 CLINICAL DATA:  Left lower quadrant pain EXAM: CT ABDOMEN AND PELVIS WITH CONTRAST TECHNIQUE: Multidetector CT imaging of the abdomen and pelvis was performed using the standard protocol following bolus administration of intravenous contrast. RADIATION DOSE REDUCTION: This exam was performed according to the departmental dose-optimization program which includes automated exposure control, adjustment of the mA and/or kV according to patient size and/or use of iterative reconstruction technique. CONTRAST:  OMNIPAQUE IOHEXOL 300 MG/ML  SOLN COMPARISON:  Radiograph 09/20/2023, CT 09/17/2023  FINDINGS: Lower chest: Lung bases demonstrate no acute airspace disease Hepatobiliary: Cholecystectomy. Hepatic steatosis. No biliary dilatation Pancreas: No acute inflammatory process. Poorly visible distal body and tail of the pancreas which could be due to chronic atrophy or agenesis. Spleen: Normal in size without focal abnormality. Adrenals/Urinary Tract: Adrenal glands are normal. Kidneys show no hydronephrosis. Subcentimeter renal hypodensities too small to further characterize, no imaging follow-up is recommended. The bladder is unremarkable Stomach/Bowel: Moderate air distension of the stomach. There is no dilated small bowel. No convincing bowel wall thickening. Decreased stool burden compared to prior CT Vascular/Lymphatic: Nonaneurysmal aorta.  No suspicious lymph nodes Reproductive: Status post hysterectomy. No adnexal masses. Other: Negative for free air.  No significant ascites Musculoskeletal: No acute osseous abnormality. IMPRESSION: 1. No CT evidence for acute  intra-abdominal or pelvic abnormality. 2. Hepatic steatosis. 3. Decreased stool burden compared to prior CT. Electronically Signed   By: Jasmine Pang [name]   On: 09/22/2023 21:34   DG Abd 1 View  Result Date: 09/20/2023 CLINICAL DATA:  [id number] Constipation [id number] EXAM: ABDOMEN - 1 VIEW COMPARISON:  08/20/2023. FINDINGS: The bowel gas pattern is non-obstructive. There is a loop of splenic flexure of colon exhibiting small amount of fecal material. Otherwise there is paucity of bowel gas. No evidence of pneumoperitoneum, within the limitations of a supine film. No acute osseous abnormalities. The soft tissues are within normal limits. Surgical changes, devices, tubes and lines: Again seen is a battery pack overlying the right upper quadrant with the lead extending superiorly out of the field of view. IMPRESSION: 1. Non-obstructive bowel gas pattern. Electronically Signed   By: Jules Schick [name]   On: 09/20/2023 09:17   DG Chest 1  View  Result Date: 09/19/2023 CLINICAL DATA:  Chest pain.  Heart palpitations. EXAM: CHEST  1 VIEW COMPARISON:  07/26/2023.  CT chest 04/30/2023. FINDINGS: Right IJ Port-A-Cath terminates in the high right atrium. Pacemaker lead tip is in the right atrium. Epicardial pacer wires project over the right heart. Heart size normal. Lungs are hyperinflated but clear. No pleural fluid. IMPRESSION: Hyperinflation without acute finding. Electronically Signed   By: Leanna Battles [name]   On: 09/19/2023 18:17   CT Renal Stone Study  Result Date: 09/17/2023 CLINICAL DATA:  Abdominal/flank pain, stone suspected Right-sided pain. EXAM: CT ABDOMEN AND PELVIS WITHOUT CONTRAST TECHNIQUE: Multidetector CT imaging of the abdomen and pelvis was performed following the standard protocol without IV contrast. RADIATION DOSE REDUCTION: This exam was performed according to the departmental dose-optimization program which includes automated exposure control, adjustment of the mA and/or kV according to patient size and/or use of iterative reconstruction technique. COMPARISON:  Most recent CT 08/20/2023.  Multiple priors reviewed. FINDINGS: Lower chest: Resolved right lung base opacity. No new airspace disease. Hepatobiliary: No evidence of focal liver abnormality. Clips in the gallbladder fossa postcholecystectomy. No biliary dilatation. Pancreas: Not well-defined on this exam. No evidence of inflammation. Spleen: No acute findings. Adrenals/Urinary Tract: No adrenal nodule. No hydronephrosis or perinephric edema. Rounded areas of high density in both kidneys correspond to hemorrhagic or proteinaceous cysts, stable. Left lower pole calcification may be a parenchymal calcification or nonobstructing stone. No evidence of ureteral calculus partially distended urinary bladder, normal for degree of distension. Stomach/Bowel: Large volume of stool throughout the colon. No bowel obstruction or inflammation. The appendix is potentially but not  definitively seen. No appendicitis. Vascular/Lymphatic: Vascular structures are poorly assessed in the absence of contrast and paucity of intra-abdominal fat. No bulky adenopathy. Reproductive: Hysterectomy.  No adnexal mass. Other: No free fluid or free air. Right abdominal wall battery pack with lead coursing superiorly. Musculoskeletal: There are no acute or suspicious osseous abnormalities. IMPRESSION: 1. No acute abnormality in the abdomen/pelvis. No obstructive uropathy. 2. Large volume of stool throughout the colon, can be seen with constipation. Electronically Signed   By: Narda Rutherford [name]   On: 09/17/2023 22:51    Microbiology: No results found for this or any previous visit (from the past 240 hour(s)).   Labs: CBC: Recent Labs  Lab 09/23/23 0534  WBC 5.5  HGB 11.5*  HCT 34.4*  MCV 79.4*  PLT 179   Basic Metabolic Panel: Recent Labs  Lab 09/25/23 0514 09/26/23 0424 09/27/23 0548 09/27/23 1907 09/28/23 0313  NA 139 140 138 137  136  K 4.2 3.5 3.1* 3.5 3.4*  CL 101 103 101 102 102  CO2 29 28 28 25 27   GLUCOSE 102* 103* 96 151* 115*  BUN 18 16 11 11 12   CREATININE 0.89 0.75 0.64 0.66 0.55  CALCIUM 8.7* 8.8* 8.7* 8.9 9.1  MG 1.8 1.3* 1.6* 1.9 1.7  PHOS 4.1 3.7  --   --  3.6   Liver Function Tests: No results for input(s): "AST", "ALT", "ALKPHOS", "BILITOT", "PROT", "ALBUMIN" in the last 168 hours. No results for input(s): "LIPASE", "AMYLASE" in the last 168 hours. No results for input(s): "AMMONIA" in the last 168 hours. Cardiac Enzymes: No results for input(s): "CKTOTAL", "CKMB", "CKMBINDEX", "TROPONINI" in the last 168 hours. BNP (last 3 results) No results for input(s): "BNP" in the last 8760 hours. CBG: No results for input(s): "GLUCAP" in the last 168 hours.  Time spent: 35 minutes  Signed:  Gillis Santa  [facility] 09/28/2023 12:54 PM

## 2023-09-28 NOTE — TOC Progression Note (Addendum)
Transition of Care [facility]) - Progression Note    Patient Details  Name: [name] MRN: [id number] Date of Birth: 03-Oct-1981  Transition of Care [facility]) CM/SW Contact  Margarito Liner, LCSW Phone Number: 09/28/2023, 11:27 AM  Clinical Narrative:   Pharmacy and yesterday's RNCM were coordinating patient's home infusion through [facility]. CSW left the liaison to voicemail.  12:19 pm: CSW called [employer], commissioning. The pharmacist and pharmacy tech were not available so CSW asked for them to call back and confirm that everything was ready for patient to discharge. Received call from [facility] with [facility]. She is checking status as well.  1:43 pm: Toni Amend with [facility] called back and confirmed they have all they need.  Expected Discharge Plan: Home w Home Health Services Barriers to Discharge: No Barriers Identified  Expected Discharge Plan and Services In-house Referral: NA Discharge Planning Services: CM Consult   Living arrangements for the past 2 months: Single Family Home Expected Discharge Date: 09/28/23               DME Arranged: N/A         HH Arranged: PT, OT, RN HH Agency: [facility] Date HH Agency Contacted: 09/21/23 Time HH Agency Contacted: 1025 Representative spoke with at [facility] Agency: [place]   Social Determinants of Health (SDOH) Interventions SDOH Screenings   Food Insecurity: No Food Insecurity (09/20/2023)  Housing: Low Risk  (09/20/2023)  Transportation Needs: No Transportation Needs (09/20/2023)  Utilities: Not At Risk (09/20/2023)  Financial Resource Strain: Low Risk  (07/12/2023)   Received from [facility]  Social Connections: Unknown (08/02/2022)   Received from [facility], [facility]  Stress: Stress Concern Present (07/20/2022)   Received from [facility], [facility]  Tobacco Use: Low Risk  (09/20/2023)    Readmission Risk Interventions     No data to display

## 2023-09-28 NOTE — Progress Notes (Signed)
PHARMACY CONSULT NOTE - FOLLOW UP  Pharmacy Consult for Electrolyte Monitoring and Replacement   Recent Labs: Potassium (mmol/L)  Date Value  09/28/2023 3.4 (L)  01/19/2015 3.4 (L)   Magnesium (mg/dL)  Date Value  69/62/9528 1.7  01/19/2015 1.0 (L)   Calcium (mg/dL)  Date Value  41/32/4401 9.1   Calcium, Total (mg/dL)  Date Value  02/72/5366 8.3 (L)   Albumin (g/dL)  Date Value  44/02/4741 4.0  06/14/2014 4.0   Phosphorus (mg/dL)  Date Value  59/56/3875 3.6   Sodium (mmol/L)  Date Value  09/28/2023 136  01/19/2015 142   Assessment: 42 yo female presented by EMS due to palpatation, chest pain and abdominal pain.  In ED patient found to have hypokalemia and hypomagnesemia.  Diet : Dys3 IVF: NS @ 50ml/hr x 48hrs started 10/13@0930  Relevant medication:  Mgchloride 64mg  BID started 10/13 -> changed to MgOx 400mg  BID on 10/15  Goal of Therapy:  Electrolytes WNL  Plan:  K 3.4: Give KCL 20 mEq PO x1 Mg 1.7, replace with Mg Sulfate 2gm IV x 1 dose  Continue oral magnesium oxide BID Recheck BMET AM labs  Thank you for involving pharmacy in this patient's care.   Rockwell Alexandria, PharmD Clinical Pharmacist 09/28/2023 7:30 AM

## 2023-09-28 NOTE — TOC Transition Note (Signed)
Transition of Care [facility]) - CM/SW Discharge Note   Patient Details  Name: [name] MRN: [id number] Date of Birth: 1981/11/10  Transition of Care [facility]) CM/SW Contact:  Margarito Liner, LCSW Phone Number: 09/28/2023, 2:03 PM   Clinical Narrative: Patient has orders to discharge home today. [facility] and [facility] are aware. No further concerns. CSW signing off.    Final next level of care: Home w Home Health Services Barriers to Discharge: Barriers Resolved   Patient Goals and CMS Choice   Choice offered to / list presented to : Parent  Discharge Placement                  Patient to be transferred to facility by: Private nurse   Patient and family notified of of transfer: 09/28/23  Discharge Plan and Services Additional resources added to the After Visit Summary for   In-house Referral: NA Discharge Planning Services: CM Consult            DME Arranged: N/A         HH Arranged: PT, OT, RN HH Agency: [facility] Date [facility] Agency Contacted: 09/21/23 Time HH Agency Contacted: 1025 Representative spoke with at [facility] Agency: [place]  Social Determinants of Health (SDOH) Interventions SDOH Screenings   Food Insecurity: No Food Insecurity (09/20/2023)  Housing: Low Risk  (09/20/2023)  Transportation Needs: No Transportation Needs (09/20/2023)  Utilities: Not At Risk (09/20/2023)  Financial Resource Strain: Low Risk  (07/12/2023)   Received from [facility]  Social Connections: Unknown (08/02/2022)   Received from [facility], [facility]  Stress: Stress Concern Present (07/20/2022)   Received from [facility], [facility]  Tobacco Use: Low Risk  (09/20/2023)     Readmission Risk Interventions     No data to display

## 2023-09-28 NOTE — Progress Notes (Signed)
Discharged. AVS printed and reviewed. All questioned answered. Transportation home with Engineer, maintenance.

## 2023-10-05 ENCOUNTER — Emergency Department
Admission: EM | Admit: 2023-10-05 | Discharge: 2023-10-05 | Disposition: A | Discharge status: Home / Self Care | Payer: Medicare HMO | Attending: Emergency Medicine | Admitting: Emergency Medicine

## 2023-10-05 ENCOUNTER — Other Ambulatory Visit: Payer: Self-pay

## 2023-10-05 ENCOUNTER — Encounter: Payer: Self-pay | Admitting: *Deleted

## 2023-10-05 DIAGNOSIS — R799 Abnormal finding of blood chemistry, unspecified: Secondary | ICD-10-CM | POA: Diagnosis present

## 2023-10-05 DIAGNOSIS — E876 Hypokalemia: Secondary | ICD-10-CM | POA: Diagnosis not present

## 2023-10-05 LAB — BASIC METABOLIC PANEL
Anion gap: 12 (ref 5–15)
BUN: 17 mg/dL (ref 6–20)
CO2: 27 mmol/L (ref 22–32)
Calcium: 9.6 mg/dL (ref 8.9–10.3)
Chloride: 99 mmol/L (ref 98–111)
Creatinine, Ser: 0.75 mg/dL (ref 0.44–1.00)
GFR, Estimated: >60 mL/min (ref >60)
Glucose, Bld: 91 mg/dL (ref 70–99)
Potassium: 3 mmol/L — ABNORMAL LOW (ref 3.5–5.1)
Sodium: 138 mmol/L (ref 135–145)

## 2023-10-05 NOTE — ED Notes (Signed)
See triage notes. Patient brought in for low potassium and low magnesium.

## 2023-10-05 NOTE — ED Triage Notes (Signed)
First nurse note: pt to ED [facility] from home for low K+ and low mag. Recently d/c for abd pain. Hx cerebral palsy.

## 2023-10-05 NOTE — ED Provider Notes (Signed)
   [facility] Provider Note    Event Date/Time   First MD Initiated Contact with Patient 10/05/23 1623     (approximate)   History   abnormal labs   HPI  [name] is a 42 y.o. female with chronic hypokalemia, chronic hypomagnesemia who was sent in for low potassium.  Patient feels well and has no complaints at this time.  Review of records demonstrates the patient was recently discharged in the hospital for hypokalemia and hypomagnesemia.  On further review the patient has been seen numerous times over the last decade for the same reasons   Physical Exam   Triage Vital Signs: ED Triage Vitals  Encounter Vitals Group     BP 10/05/23 1520 (!) 118/94     Systolic BP Percentile --      Diastolic BP Percentile --      Pulse Rate 10/05/23 1520 85     Resp 10/05/23 1520 17     Temp 10/05/23 1520 98.5 F (36.9 C)     Temp src --      SpO2 10/05/23 1520 100 %     Weight 10/05/23 1520 41 kg (90 lb 6.2 oz)     Height --      Head Circumference --      Peak Flow --      Pain Score 10/05/23 1520 8     Pain Loc --      Pain Education --      Exclude from Growth Chart --     Most recent vital signs: Vitals:   10/05/23 1520  BP: (!) 118/94  Pulse: 85  Resp: 17  Temp: 98.5 F (36.9 C)  SpO2: 100%     General: Awake, no distress.  CV:  Good peripheral perfusion.  Resp:  Normal effort.  Abd:  No distention.  Other:     ED Results / Procedures / Treatments   Labs (all labs ordered are listed, but only abnormal results are displayed) Labs Reviewed  BASIC METABOLIC PANEL - Abnormal; Notable for the following components:      Result Value   Potassium 3.0 (*)    All other components within normal limits     EKG     RADIOLOGY     PROCEDURES:  Critical Care performed:   Procedures   MEDICATIONS ORDERED IN ED: Medications - No data to display   IMPRESSION / MDM / ASSESSMENT AND PLAN / ED COURSE  I reviewed the triage  vital signs and the nursing notes. Patient's presentation is most consistent with exacerbation of chronic illness.  BMP reviewed, potassium is 3.0, this is actually relatively decent for her, no indication for repletion at this time as she already has this done as an outpatient.  Appropriate for discharge with outpatient follow-up        FINAL CLINICAL IMPRESSION(S) / ED DIAGNOSES   Final diagnoses:  Chronic hypokalemia     Rx / DC Orders   ED Discharge Orders     None        Note:  This document was prepared using Dragon voice recognition software and may include unintentional dictation errors.   Jene Every, MD 10/05/23 [phone]

## 2023-10-05 NOTE — ED Notes (Signed)
Called c com for transport to home   [address] [facility]

## 2023-10-05 NOTE — ED Triage Notes (Signed)
BIB [facility] from home for abnormal labs: low potassium and low magnesium. Also mentions chronic abd pain. Lives with mother at home. Rates abd pain 8/10, also R sided neck pain. Endorses some light headedness. Alert, NAD, calm, interactive, resps e/u.

## 2023-10-05 NOTE — ED Notes (Signed)
Called by [facility] crew chief that he made arrangments for [facility] to transport patient to facility  called [facility] and coordinated transport [id number]

## 2023-10-05 NOTE — Discharge Instructions (Signed)
Your potassium was 3.0 today, you have chronically low potassium and this within range for you

## 2023-10-16 ENCOUNTER — Other Ambulatory Visit: Payer: Self-pay

## 2023-10-16 ENCOUNTER — Emergency Department: Payer: Medicare HMO

## 2023-10-16 ENCOUNTER — Observation Stay
Admission: EM | Admit: 2023-10-16 | Discharge: 2023-10-18 | Disposition: A | Discharge status: Home Health | Payer: Medicare HMO | Attending: Internal Medicine | Admitting: Internal Medicine

## 2023-10-16 DIAGNOSIS — I10 Essential (primary) hypertension: Secondary | ICD-10-CM | POA: Diagnosis not present

## 2023-10-16 DIAGNOSIS — R079 Chest pain, unspecified: Secondary | ICD-10-CM | POA: Diagnosis present

## 2023-10-16 DIAGNOSIS — R531 Weakness: Secondary | ICD-10-CM | POA: Insufficient documentation

## 2023-10-16 DIAGNOSIS — R0789 Other chest pain: Secondary | ICD-10-CM | POA: Diagnosis not present

## 2023-10-16 DIAGNOSIS — E876 Hypokalemia: Secondary | ICD-10-CM | POA: Diagnosis not present

## 2023-10-16 DIAGNOSIS — K219 Gastro-esophageal reflux disease without esophagitis: Secondary | ICD-10-CM | POA: Diagnosis present

## 2023-10-16 DIAGNOSIS — M6281 Muscle weakness (generalized): Secondary | ICD-10-CM | POA: Diagnosis not present

## 2023-10-16 DIAGNOSIS — R2689 Other abnormalities of gait and mobility: Secondary | ICD-10-CM | POA: Diagnosis not present

## 2023-10-16 DIAGNOSIS — K589 Irritable bowel syndrome without diarrhea: Secondary | ICD-10-CM | POA: Diagnosis not present

## 2023-10-16 DIAGNOSIS — Z950 Presence of cardiac pacemaker: Secondary | ICD-10-CM | POA: Insufficient documentation

## 2023-10-16 DIAGNOSIS — Z7901 Long term (current) use of anticoagulants: Secondary | ICD-10-CM | POA: Insufficient documentation

## 2023-10-16 DIAGNOSIS — G629 Polyneuropathy, unspecified: Secondary | ICD-10-CM

## 2023-10-16 DIAGNOSIS — Z79899 Other long term (current) drug therapy: Secondary | ICD-10-CM | POA: Insufficient documentation

## 2023-10-16 LAB — COMPREHENSIVE METABOLIC PANEL
ALT: 29 U/L (ref 0–44)
AST: 22 U/L (ref 15–41)
Albumin: 3.7 g/dL (ref 3.5–5.0)
Alkaline Phosphatase: 65 U/L (ref 38–126)
Anion gap: 12 (ref 5–15)
BUN: 16 mg/dL (ref 6–20)
CO2: 25 mmol/L (ref 22–32)
Calcium: 9.1 mg/dL (ref 8.9–10.3)
Chloride: 102 mmol/L (ref 98–111)
Creatinine, Ser: 0.78 mg/dL (ref 0.44–1.00)
GFR, Estimated: >60 mL/min (ref >60)
Glucose, Bld: 103 mg/dL — ABNORMAL HIGH (ref 70–99)
Potassium: 2.5 mmol/L — CL (ref 3.5–5.1)
Sodium: 139 mmol/L (ref 135–145)
Total Bilirubin: 0.8 mg/dL (ref 0.3–1.2)
Total Protein: 6.6 g/dL (ref 6.5–8.1)

## 2023-10-16 LAB — CBC WITH DIFFERENTIAL/PLATELET
Abs Immature Granulocytes: 0.01 10*3/uL (ref 0.00–0.07)
Basophils Absolute: 0 10*3/uL (ref 0.0–0.1)
Basophils Relative: 0 %
Eosinophils Absolute: 0 10*3/uL (ref 0.0–0.5)
Eosinophils Relative: 0 %
HCT: 30.1 % — ABNORMAL LOW (ref 36.0–46.0)
Hemoglobin: 10.1 g/dL — ABNORMAL LOW (ref 12.0–15.0)
Immature Granulocytes: 0 %
Lymphocytes Relative: 29 %
Lymphs Abs: 1.3 10*3/uL (ref 0.7–4.0)
MCH: 26.4 pg (ref 26.0–34.0)
MCHC: 33.6 g/dL (ref 30.0–36.0)
MCV: 78.6 fL — ABNORMAL LOW (ref 80.0–100.0)
Monocytes Absolute: 0.4 10*3/uL (ref 0.1–1.0)
Monocytes Relative: 9 %
Neutro Abs: 2.8 10*3/uL (ref 1.7–7.7)
Neutrophils Relative %: 62 %
Platelets: 151 10*3/uL (ref 150–400)
RBC: 3.83 MIL/uL — ABNORMAL LOW (ref 3.87–5.11)
RDW: 13.8 % (ref 11.5–15.5)
WBC: 4.5 10*3/uL (ref 4.0–10.5)
nRBC: 0 % (ref 0.0–0.2)

## 2023-10-16 LAB — TROPONIN I (HIGH SENSITIVITY): Troponin I (High Sensitivity): 3 ng/L

## 2023-10-16 LAB — MAGNESIUM: Magnesium: 1.8 mg/dL (ref 1.7–2.4)

## 2023-10-16 MED ORDER — ONDANSETRON HCL 4 MG PO TABS: 4.0000 mg | ORAL_TABLET | Freq: Four times a day (QID) | ORAL | Status: DC | PRN

## 2023-10-16 MED ORDER — ACETAMINOPHEN 325 MG PO TABS: 650.0000 mg | ORAL_TABLET | Freq: Four times a day (QID) | ORAL | Status: DC | PRN

## 2023-10-16 MED ORDER — PANTOPRAZOLE SODIUM 40 MG PO TBEC
40.0000 mg | DELAYED_RELEASE_TABLET | Freq: Every day | ORAL | Status: DC
  Administered 2023-10-17 – 2023-10-18 (×2): 40 mg via ORAL
  Filled 2023-10-16 (×2): qty 1

## 2023-10-16 MED ORDER — FAMOTIDINE 20 MG PO TABS: 40.0000 mg | ORAL_TABLET | Freq: Every evening | ORAL | Status: DC | PRN

## 2023-10-16 MED ORDER — SUCRALFATE 1 G PO TABS
1.0000 g | ORAL_TABLET | Freq: Four times a day (QID) | ORAL | Status: DC
  Administered 2023-10-17 – 2023-10-18 (×6): 1 g via ORAL
  Filled 2023-10-16 (×6): qty 1

## 2023-10-16 MED ORDER — APIXABAN 5 MG PO TABS
5.0000 mg | ORAL_TABLET | Freq: Two times a day (BID) | ORAL | Status: DC
  Administered 2023-10-17 – 2023-10-18 (×4): 5 mg via ORAL
  Filled 2023-10-16 (×4): qty 1

## 2023-10-16 MED ORDER — METOCLOPRAMIDE HCL 10 MG PO TABS
10.0000 mg | ORAL_TABLET | Freq: Three times a day (TID) | ORAL | Status: DC | PRN
Start: 2023-10-16 — End: 2023-10-18

## 2023-10-16 MED ORDER — TRAMADOL HCL 50 MG PO TABS
50.0000 mg | ORAL_TABLET | Freq: Four times a day (QID) | ORAL | Status: DC | PRN
  Filled 2023-10-16: qty 1

## 2023-10-16 MED ORDER — MIDODRINE HCL 5 MG PO TABS
10.0000 mg | ORAL_TABLET | Freq: Three times a day (TID) | ORAL | Status: DC
  Administered 2023-10-17 – 2023-10-18 (×5): 10 mg via ORAL
  Filled 2023-10-16 (×5): qty 2

## 2023-10-16 MED ORDER — VITAMIN D (ERGOCALCIFEROL) 1.25 MG (50000 UNIT) PO CAPS: 50000.0000 [IU] | ORAL_CAPSULE | ORAL | Status: DC

## 2023-10-16 MED ORDER — BISACODYL 5 MG PO TBEC
10.0000 mg | DELAYED_RELEASE_TABLET | Freq: Two times a day (BID) | ORAL | Status: DC
  Administered 2023-10-17: 10 mg via ORAL
  Filled 2023-10-16 (×3): qty 2

## 2023-10-16 MED ORDER — LINACLOTIDE 145 MCG PO CAPS
290.0000 ug | ORAL_CAPSULE | Freq: Every day | ORAL | Status: DC
Start: 2023-10-17 — End: 2023-10-18
  Administered 2023-10-17: 290 ug via ORAL
  Filled 2023-10-16: qty 2
  Filled 2023-10-16: qty 1
  Filled 2023-10-16: qty 2

## 2023-10-16 MED ORDER — POTASSIUM CHLORIDE 20 MEQ PO PACK
40.0000 meq | PACK | Freq: Once | ORAL | Status: DC
  Filled 2023-10-16: qty 2

## 2023-10-16 MED ORDER — POTASSIUM CHLORIDE IN NACL 20-0.9 MEQ/L-% IV SOLN
INTRAVENOUS | Status: AC
  Filled 2023-10-16 (×3): qty 1000

## 2023-10-16 MED ORDER — MAGNESIUM HYDROXIDE 400 MG/5ML PO SUSP: 30.0000 mL | Freq: Every day | ORAL | Status: DC | PRN

## 2023-10-16 MED ORDER — PREGABALIN 75 MG PO CAPS
150.0000 mg | ORAL_CAPSULE | Freq: Two times a day (BID) | ORAL | Status: DC
  Administered 2023-10-17 – 2023-10-18 (×2): 150 mg via ORAL
  Filled 2023-10-16 (×3): qty 2

## 2023-10-16 MED ORDER — METHYLPHENIDATE HCL 10 MG PO TABS
20.0000 mg | ORAL_TABLET | Freq: Three times a day (TID) | ORAL | Status: DC
  Administered 2023-10-17 – 2023-10-18 (×5): 20 mg via ORAL
  Filled 2023-10-16 (×7): qty 2

## 2023-10-16 MED ORDER — VITAMIN D3 25 MCG (1000 UNIT) PO TABS
2000.0000 [IU] | ORAL_TABLET | Freq: Every day | ORAL | Status: DC
  Administered 2023-10-17 – 2023-10-18 (×2): 2000 [IU] via ORAL
  Filled 2023-10-16 (×3): qty 2

## 2023-10-16 MED ORDER — ENOXAPARIN SODIUM 40 MG/0.4ML IJ SOSY: 40.0000 mg | PREFILLED_SYRINGE | INTRAMUSCULAR | Status: DC

## 2023-10-16 MED ORDER — ACETAMINOPHEN 650 MG RE SUPP: 650.0000 mg | Freq: Four times a day (QID) | RECTAL | Status: DC | PRN

## 2023-10-16 MED ORDER — MAGNESIUM SULFATE 2 GM/50ML IV SOLN
2.0000 g | Freq: Once | INTRAVENOUS | Status: AC
  Administered 2023-10-16: 2 g via INTRAVENOUS
  Filled 2023-10-16: qty 50

## 2023-10-16 MED ORDER — VITAMIN B-12 1000 MCG PO TABS
1000.0000 ug | ORAL_TABLET | Freq: Every day | ORAL | Status: DC
  Administered 2023-10-17 – 2023-10-18 (×2): 1000 ug via ORAL
  Filled 2023-10-16: qty 2
  Filled 2023-10-16: qty 1

## 2023-10-16 MED ORDER — ONDANSETRON HCL 4 MG/2ML IJ SOLN: 4.0000 mg | Freq: Four times a day (QID) | INTRAMUSCULAR | Status: DC | PRN

## 2023-10-16 MED ORDER — MAGNESIUM OXIDE -MG SUPPLEMENT 400 (240 MG) MG PO TABS
400.0000 mg | ORAL_TABLET | Freq: Once | ORAL | Status: DC
  Filled 2023-10-16: qty 1

## 2023-10-16 MED ORDER — MAGNESIUM CHLORIDE 200 MG/ML IJ SOLN: 4000.0000 mg | INTRAMUSCULAR | Status: DC

## 2023-10-16 MED ORDER — MAGNESIUM LACTATE 84 MG (7MEQ) PO TBCR
936.0000 mg | EXTENDED_RELEASE_TABLET | Freq: Three times a day (TID) | ORAL | Status: DC
  Filled 2023-10-16: qty 11

## 2023-10-16 MED ORDER — BISACODYL 10 MG RE SUPP: 10.0000 mg | Freq: Every day | RECTAL | Status: DC | PRN

## 2023-10-16 MED ORDER — MONTELUKAST SODIUM 10 MG PO TABS: 10.0000 mg | ORAL_TABLET | Freq: Every day | ORAL | Status: DC | PRN

## 2023-10-16 MED ORDER — METHOCARBAMOL 500 MG PO TABS
500.0000 mg | ORAL_TABLET | Freq: Four times a day (QID) | ORAL | Status: DC | PRN
  Filled 2023-10-16: qty 1

## 2023-10-16 MED ORDER — POTASSIUM CHLORIDE 10 MEQ/100ML IV SOLN
10.0000 meq | INTRAVENOUS | Status: AC
  Administered 2023-10-16 – 2023-10-17 (×4): 10 meq via INTRAVENOUS
  Filled 2023-10-16 (×4): qty 100

## 2023-10-16 MED ORDER — POTASSIUM CHLORIDE CRYS ER 20 MEQ PO TBCR
40.0000 meq | EXTENDED_RELEASE_TABLET | Freq: Once | ORAL | Status: AC
  Administered 2023-10-16: 40 meq via ORAL
  Filled 2023-10-16: qty 2

## 2023-10-16 MED ORDER — POLYETHYLENE GLYCOL 3350 17 G PO PACK
17.0000 g | PACK | Freq: Two times a day (BID) | ORAL | Status: DC
  Administered 2023-10-17 (×2): 17 g via ORAL
  Filled 2023-10-16 (×3): qty 1

## 2023-10-16 MED ORDER — TRAZODONE HCL 50 MG PO TABS
25.0000 mg | ORAL_TABLET | Freq: Every evening | ORAL | Status: DC | PRN
  Filled 2023-10-16: qty 1

## 2023-10-16 NOTE — ED Provider Notes (Signed)
[facility] Provider Note    Event Date/Time   First MD Initiated Contact with Patient 10/16/23 2058     (approximate)   History   Chief Complaint Chest Pain   HPI  [name] is a 42 y.o. female with past medical history of cerebral palsy, Gitelman syndrome, CRPS, and POTS who presents to the ED complaining of chest pain.  Patient reports that she has been dealing with increasing burning discomfort over the right side of her chest in the area of her Mediport.  She has not noticed any redness or swelling there, denies any fevers or cough.     Physical Exam   Triage Vital Signs: ED Triage Vitals  Encounter Vitals Group     BP 10/16/23 1726 (!) 112/91     Systolic BP Percentile --      Diastolic BP Percentile --      Pulse Rate 10/16/23 1726 85     Resp 10/16/23 1725 17     Temp 10/16/23 1726 98.3 F (36.8 C)     Temp src --      SpO2 10/16/23 1726 100 %     Weight --      Height --      Head Circumference --      Peak Flow --      Pain Score 10/16/23 1722 6     Pain Loc --      Pain Education --      Exclude from Growth Chart --     Most recent vital signs: Vitals:   10/16/23 2106 10/16/23 2237  BP:  122/75  Pulse: 81 85  Resp: 20 18  Temp: 98.3 F (36.8 C) 98.1 F (36.7 C)  SpO2:  100%    Constitutional: Alert and oriented. Eyes: Conjunctivae are normal. Head: Atraumatic. Nose: No congestion/rhinnorhea. Mouth/Throat: Mucous membranes are moist.  Cardiovascular: Normal rate, regular rhythm. Grossly normal heart sounds.  2+ radial pulses bilaterally. Respiratory: Normal respiratory effort.  No retractions. Lungs CTAB.  No erythema, warmth, or tenderness noted over right chest in the area of the Mediport.  Right chest wall tenderness to palpation noted. Gastrointestinal: Soft and nontender. No distention. Musculoskeletal: No lower extremity tenderness nor edema.  Neurologic:  Normal speech and language. No gross focal  neurologic deficits are appreciated.    ED Results / Procedures / Treatments   Labs (all labs ordered are listed, but only abnormal results are displayed) Labs Reviewed  COMPREHENSIVE METABOLIC PANEL - Abnormal; Notable for the following components:      Result Value   Potassium 2.5 (*)    Glucose, Bld 103 (*)    All other components within normal limits  CBC WITH DIFFERENTIAL/PLATELET - Abnormal; Notable for the following components:   RBC 3.83 (*)    Hemoglobin 10.1 (*)    HCT 30.1 (*)    MCV 78.6 (*)    All other components within normal limits  MAGNESIUM  CBC WITH DIFFERENTIAL/PLATELET  TROPONIN I (HIGH SENSITIVITY)     EKG  ED ECG REPORT I, Chesley Noon, the attending physician, personally viewed and interpreted this ECG.   Date: 10/16/2023  EKG Time: 21:29  Rate: 85  Rhythm: Atrial paced rhythm  Axis: Normal  Intervals:none  ST&T Change: None  RADIOLOGY Chest x-ray reviewed and interpreted by me with no infiltrate, edema, or effusion.  PROCEDURES:  Critical Care performed: No  Procedures   MEDICATIONS ORDERED IN ED: Medications  potassium chloride 10  mEq in 100 mL IVPB (0 mEq Intravenous Stopped 10/16/23 2321)  magnesium oxide (MAG-OX) tablet 400 mg (has no administration in time range)  magnesium sulfate IVPB 2 g 50 mL (0 g Intravenous Stopped 10/16/23 2321)  potassium chloride SA (KLOR-CON M) CR tablet 40 mEq (40 mEq Oral Given 10/16/23 2200)     IMPRESSION / MDM / ASSESSMENT AND PLAN / ED COURSE  I reviewed the triage vital signs and the nursing notes.                              42 y.o. female with past medical history of cerebral palsy, Gitelman syndrome, CRPS, and POTS who presents to the ED complaining of burning discomfort in her right chest in the area of her Mediport.  Patient's presentation is most consistent with acute presentation with potential threat to life or bodily function.  Differential diagnosis includes, but is not limited  to, cellulitis, abscess, pneumonia, pneumothorax, ACS, PE, musculoskeletal pain, electrolyte abnormality, AKI.  Patient well-appearing and in no acute distress, vital signs are unremarkable.  EKG shows no evidence of arrhythmia or ischemia, troponin within normal limits and I doubt ACS or PE.  No signs of infection noted in the area of her Mediport and chest x-ray is unremarkable.  She does have significant hypokalemia, which is an acute on chronic issue for her.  Magnesium level within normal limits, given her significant issues with potassium wasting and difficulty with oral repletion.  Case discussed with hospitalist for admission.      FINAL CLINICAL IMPRESSION(S) / ED DIAGNOSES   Final diagnoses:  Chest wall pain  Hypokalemia     Rx / DC Orders   ED Discharge Orders     None        Note:  This document was prepared using Dragon voice recognition software and may include unintentional dictation errors.   Chesley Noon, MD 10/16/23 [phone]

## 2023-10-16 NOTE — ED Triage Notes (Addendum)
Pt from home via ems- pt reports pain around port access since Friday (x2 days). Pt reports last infusion was Wednesday night and the medication was left accessed until Friday. Port not working today per Patent examiner. Tenderness around port site on right chest. No redness swelling or discharge noted.

## 2023-10-16 NOTE — ED Notes (Signed)
MD [address]ty [address] informed of K+ of 2.5

## 2023-10-16 NOTE — H&P (Signed)
[place]   PATIENT NAME: [name]    MR#:  [id number]  DATE OF BIRTH:  Aug 10, 1981  DATE OF ADMISSION:  10/16/2023  PRIMARY CARE PHYSICIAN: Dione Housekeeper, MD   Patient is coming from: Home  REQUESTING/REFERRING PHYSICIAN: Chesley Noon, MD  CHIEF COMPLAINT:  Pain around right chest port, generalized weakness and muscle cramps.  HISTORY OF PRESENT ILLNESS:  [name] is a 42 y.o. Caucasian female with medical history significant for cerebral palsy and history of hypokalemia and hypomagnesemia, who presented to the emergency room with acute onset of right chest Port-A-Cath pain with generalized weakness and muscle cramps mainly in both thighs.  She admitted to nausea without vomiting or diarrhea.  She has been having indigestion.  She admitted to chills without fever over the last couple nights.  ED Course: When she into the ER, vital signs were within normal.  Labs revealed potassium of 2.5 down from 3 on 10/05/2023 and magnesium of 1.8 with otherwise unremarkable CMP.  High sensitive troponin I was 3 and CBC showed anemia slightly worse than previous levels, with microcytosis. EKG as reviewed by me : EKG showed atrial paced rhythm and rate of 85 with prolonged AV conduction. Imaging: Two-view chest x-ray was normal.  The patient was given 400 mg p.o. magnesium oxide, 2 g of IV magnesium sulfate, 40 mill equivalent p.o. potassium chloride and 10 mill equivalent IV KCl.  She will be admitted to an observation medical telemetry bed for further evaluation and management. PAST MEDICAL HISTORY:   Past Medical History:  Diagnosis Date   Carpal tunnel syndrome    Cerebral palsy (HCC)    Chronic back pain    Gitelman syndrome    Hypokalemia    now resolved   Magnesium deficiency    Pacemaker    PONV (postoperative nausea and vomiting)    Postural orthostatic tachycardia syndrome    s/p pacemaker placement.   RSD (reflex sympathetic dystrophy)    right  foot,    RSD (reflex sympathetic dystrophy)    SA node dysfunction (HCC) 2008    PAST SURGICAL HISTORY:   Past Surgical History:  Procedure Laterality Date   ABDOMINAL HYSTERECTOMY Left 2013   Dr. Tiburcio Pea   COLONOSCOPY WITH PROPOFOL N/A 12/11/2022   Procedure: COLONOSCOPY WITH PROPOFOL;  Surgeon: Toney Reil, MD;  Location: [facility] ENDOSCOPY;  Service: Gastroenterology;  Laterality: N/A;   COLONOSCOPY WITH PROPOFOL N/A 12/13/2022   Procedure: COLONOSCOPY WITH PROPOFOL;  Surgeon: Toney Reil, MD;  Location: [facility] ENDOSCOPY;  Service: Gastroenterology;  Laterality: N/A;   INSERT / REPLACE / REMOVE PACEMAKER     ORTHOPEDIC SURGERY     PACEMAKER INSERTION     PORTACATH PLACEMENT      SOCIAL HISTORY:   Social History   Tobacco Use   Smoking status: Never   Smokeless tobacco: Never  Substance Use Topics   Alcohol use: No    Alcohol/week: 0.0 standard drinks of alcohol    FAMILY HISTORY:   Family History  Problem Relation Age of Onset   Diabetes Mother    Hypertension Mother     DRUG ALLERGIES:   Allergies  Allergen Reactions   Duloxetine     Other reaction(s): Other (See Comments), Palpitations Other reaction(s): Hallucinations, Hyperactive behavior (finding), Other (See Comments) Stressed out     Flecainide     Other reaction(s): Other (See Comments) Other reaction(s): Hypomagnesemia (disorder), Other (See Comments) Other Reaction: QUESTION HYPOMAGNESEMIA  Fluoxetine     Other reaction(s): Nausea And Vomiting, Other (See Comments) Other reaction(s): Other (See Comments) Other Reaction: INCREASED HR  causes irritability and irrational behavior "I go crazy"   Gabapentin     Goes crazy Other reaction(s): Other (See Comments) Other reaction(s): Hyperactive behavior (finding), Mental Status Changes (intolerance), Other (See Comments) Goes crazy Hallucinations   Baclofen     Other reaction(s): Other (See Comments), Other (See Comments) Weakness   Other reaction(s): Other (See Comments) Weakness and loose Weakness  Other reaction(s):  Weakness     Cephalosporins     Other reaction(s): Hives, Rash Received pre-op vancomycin and cefazolin 07/20/16. Developed urticarial rash on bilateral lower extremities + slight periorbital swelling. Treated with diphenhydramine. Received pre-op vancomycin and cefazolin 07/20/16. Developed urticarial rash on bilateral lower extremities + slight periorbital swelling. Treated with diphenhydramine. Received pre-op vancomycin and cefazolin 07/20/16. Developed urticarial rash on bilateral lower extremities + slight periorbital swelling. Treated with diphenhydramine.   Sulfasalazine     Other reaction(s): Nausea And Vomiting   Sulfa Antibiotics Nausea And Vomiting   Vancomycin     Other reaction(s): Hives Received pre-op vancomycin and cefazolin 07/20/16. Developed urticarial rash on bilateral lower extremities + slight periorbital swelling. Treated with diphenhydramine. Received pre-op vancomycin and cefazolin 07/20/16. Developed urticarial rash on bilateral lower extremities + slight periorbital swelling. Treated with diphenhydramine. Received pre-op vancomycin and cefazolin 07/20/16. Developed urticarial rash on bilateral lower extremities + slight periorbital swelling. Treated with diphenhydramine.   Cefazolin Rash    Tolerated ceftriaxone 2023 and cephalexin 2022    REVIEW OF SYSTEMS:   ROS As per history of present illness. All pertinent systems were reviewed above. Constitutional, HEENT, cardiovascular, respiratory, GI, GU, musculoskeletal, neuro, psychiatric, endocrine, integumentary and hematologic systems were reviewed and are otherwise negative/unremarkable except for positive findings mentioned above in the HPI.   MEDICATIONS AT HOME:   Prior to Admission medications   Medication Sig Start Date End Date Taking? Authorizing Provider  acetaminophen (TYLENOL) 325 MG tablet Take 2 tablets (650 mg  total) by mouth every 6 (six) hours as needed for mild pain (pain score 1-3) or fever. 09/28/23   Gillis Santa, MD  bisacodyl (DULCOLAX) 10 MG suppository Place 1 suppository (10 mg total) rectally daily as needed for severe constipation. 09/28/23 09/27/24  Gillis Santa, MD  bisacodyl (DULCOLAX) 5 MG EC tablet Take 2 tablets (10 mg total) by mouth 2 (two) times daily. 09/28/23 09/27/24  Gillis Santa, MD  Cholecalciferol (D2000 ULTRA STRENGTH) 50 MCG (2000 UT) CAPS Take 1 tablet by mouth daily.    [provider]  cyanocobalamin (VITAMIN B12) 1000 MCG tablet Take 1,000 mcg by mouth daily.    [provider]  ELIQUIS 5 MG TABS tablet Take 5 mg by mouth 2 (two) times daily. 08/12/22   [provider]  famotidine (PEPCID) 40 MG tablet Take 1 tablet (40 mg total) by mouth at bedtime as needed for heartburn. 09/28/23   Gillis Santa, MD  heparin lock flush 100 UNIT/ML SOLN injection Inject 5 mL (500 Units total) as directed as needed for line care. Flush using SASH (saline, administer  IV, saline, heparin) method as directed. Flush IV catheter with heparin after the last saline flush.  Use syringe ONE TIME only then discard. Storage: Room Temperature. 08/30/23 08/30/24  [provider]  LINZESS 290 MCG CAPS capsule Take 290 mcg by mouth daily. 10/26/22   [provider]  magnesium (MAGTAB) 84 MG ( ) TBCR SR tablet Take  936 mg by mouth 3 (three) times daily. 11/30/22   [provider]  magnesium chloride 200 MG/ML SOLN Inject 20 mLs (4,000 mg total) as directed 2 (two) times a week. 4gm MgSulfate on 1000 ml LR per home infusion 09/29/23 09/28/24  Gillis Santa, MD  methocarbamol (ROBAXIN) 500 MG tablet Take 500-1,000 mg by mouth every 6 (six) hours as needed (for back spasms).    [provider]  methylphenidate (RITALIN) 20 MG tablet Take 20 mg by mouth 3 (three) times daily. Take 1 tablet (20 mg) TID at 0800, 1200 & 2100-2200    [provider]  metoCLOPramide (REGLAN) 10 MG tablet Take 1 tablet (10 mg total) by mouth every 8 (eight) hours as needed for up to 3 days for nausea or vomiting. 05/29/23 09/19/23  Gowens, Mariah L, PA-C  midodrine (PROAMATINE) 10 MG tablet Take 1 tablet (10 mg total) by mouth 3 (three) times daily with meals. Hold if Systolic BP >110 81/19/14 03/26/24  Gillis Santa, MD  montelukast (SINGULAIR) 10 MG tablet Take 10 mg by mouth daily as needed (allergies). 10/26/22   [provider]  pantoprazole (PROTONIX) 40 MG tablet Take 40 mg by mouth daily. 08/06/21 02/15/24  [provider]  polyethylene glycol (MIRALAX / GLYCOLAX) 17 g packet Take 17 g by mouth 2 (two) times daily. 09/28/23 09/27/24  Gillis Santa, MD  pregabalin (LYRICA) 150 MG capsule Take 150 mg by mouth in the morning, at noon, and at bedtime.    [provider]  PREMARIN vaginal cream Place 0.5 g vaginally 2 (two) times a week. 11/08/22   [provider]  sucralfate (CARAFATE) 1 g tablet Take 1 g by mouth 4 (four) times daily.    [provider]  traMADol (ULTRAM) 50 MG tablet Take 50 mg by mouth every 6 (six) hours as needed for moderate pain. 09/20/13   [provider]  Vitamin D, Ergocalciferol, (DRISDOL) 1.25 MG (50000 UNIT) CAPS capsule Take 1 capsule (50,000 Units total) by mouth every 7 (seven) days. 09/29/23 12/28/23  Gillis Santa, MD      VITAL SIGNS:  Blood pressure 101/73, pulse 88, temperature 97.7 F (36.5 C), temperature source Oral, resp. rate 16, height 5\' 2"  (1.575 m), weight 41 kg, last menstrual period 06/09/2012, SpO2 100%.  PHYSICAL EXAMINATION:  Physical Exam  GENERAL:  42 y.o.-year-old female patient lying in the bed with no acute distress.  EYES: Pupils equal, round, reactive to light and accommodation. No scleral icterus. Extraocular muscles intact.  HEENT: Head atraumatic, normocephalic. Oropharynx and nasopharynx clear.  NECK:  Supple, no jugular venous  distention. No thyroid enlargement, no tenderness.  LUNGS: Normal breath sounds bilaterally, no wheezing, rales,rhonchi or crepitation. No use of accessory muscles of respiration.  CARDIOVASCULAR: Regular rate and rhythm, S1, S2 normal. No murmurs, rubs, or gallops.  ABDOMEN: Soft, nondistended, nontender. Bowel sounds present. No organomegaly or mass.  EXTREMITIES: No pedal edema, cyanosis, or clubbing.  NEUROLOGIC: She has cerebral palsy.  Cranial nerves II through XII are intact.  Moving all 4 extremities.  Sensation intact. Gait not checked.  PSYCHIATRIC: The patient is alert and oriented x 3.  Normal affect and good eye contact. SKIN: No obvious rash, lesion, or ulcer.   LABORATORY PANEL:   CBC Recent Labs  Lab 10/16/23 2230  WBC 4.5  HGB 10.1*  HCT 30.1*  PLT 151   ------------------------------------------------------------------------------------------------------------------  Chemistries  Recent Labs  Lab 10/16/23 1727 10/16/23 2230  NA 139  --  K 2.5*  --   CL 102  --   CO2 25  --   GLUCOSE 103*  --   BUN 16  --   CREATININE 0.78  --   CALCIUM 9.1  --   MG  --  1.8  AST 22  --   ALT 29  --   ALKPHOS 65  --   BILITOT 0.8  --    ------------------------------------------------------------------------------------------------------------------  Cardiac Enzymes No results for input(s): "TROPONINI" in the last 168 hours. ------------------------------------------------------------------------------------------------------------------  RADIOLOGY:  DG Chest 2 View  Result Date: 10/16/2023 CLINICAL DATA:  Chest pain EXAM: CHEST - 2 VIEW COMPARISON:  09/19/2023 FINDINGS: Lungs are clear.  No pleural effusion or pneumothorax. The heart is normal in size.  Left subclavian pacemaker. Mild degenerative changes of the upper lumbar spine. Median sternotomy. IMPRESSION: Normal chest radiographs. Electronically Signed   By: Charline Bills [name]   On: 10/16/2023 22:02       IMPRESSION AND PLAN:  Assessment and Plan: * Hypokalemia - The patient admitted to a medical telemetry observation bed. - Will aggressively replace her potassium. - Potassium level will be followed. - This is associated with borderline magnesium and it was replaced. - This is likely contributing to her generalized weakness and muscle cramps.  Chest wall pain - Pain management will be provided.  Cardiac enzymes were negative.  IBS (irritable bowel syndrome) - We will continue her Linzess and Amitiza.  GERD without esophagitis - We will do PPI therapy and Carafate.  Essential hypertension - We will continue antihypertensive therapy.  Peripheral neuropathy - We will continue Lyrica.   DVT prophylaxis: Lovenox.  Advanced Care Planning:  Code Status: full code.  Family Communication:  The plan of care was discussed in details with the patient (and family). I answered all questions. The patient agreed to proceed with the above mentioned plan. Further management will depend upon hospital course. Disposition Plan: Back to previous home environment Consults called: none.  All the records are reviewed and case discussed with ED provider.  Status is: Observation  I certify that at the time of admission, it is my clinical judgment that the patient will require  hospital care extending less than 2 midnights.                            Dispo: The patient is from: Home              Anticipated d/c is to: Home              Patient currently is not medically stable to d/c.              Difficult to place patient: No  Hannah Beat M.D on 10/17/2023 at 4:50 AM  [facility]   From 7 PM-7 AM, contact night-coverage [email or web address]  CC: Primary care physician; Dione Housekeeper, MD

## 2023-10-17 DIAGNOSIS — R0789 Other chest pain: Secondary | ICD-10-CM | POA: Diagnosis not present

## 2023-10-17 DIAGNOSIS — I10 Essential (primary) hypertension: Secondary | ICD-10-CM

## 2023-10-17 DIAGNOSIS — K219 Gastro-esophageal reflux disease without esophagitis: Secondary | ICD-10-CM

## 2023-10-17 DIAGNOSIS — K589 Irritable bowel syndrome without diarrhea: Secondary | ICD-10-CM

## 2023-10-17 DIAGNOSIS — G629 Polyneuropathy, unspecified: Secondary | ICD-10-CM

## 2023-10-17 DIAGNOSIS — E876 Hypokalemia: Secondary | ICD-10-CM | POA: Diagnosis not present

## 2023-10-17 LAB — BASIC METABOLIC PANEL
Anion gap: 10 (ref 5–15)
BUN: 12 mg/dL (ref 6–20)
CO2: 27 mmol/L (ref 22–32)
Calcium: 9 mg/dL (ref 8.9–10.3)
Chloride: 105 mmol/L (ref 98–111)
Creatinine, Ser: 0.59 mg/dL (ref 0.44–1.00)
GFR, Estimated: >60 mL/min (ref >60)
Glucose, Bld: 91 mg/dL (ref 70–99)
Potassium: 3.8 mmol/L (ref 3.5–5.1)
Sodium: 142 mmol/L (ref 135–145)

## 2023-10-17 LAB — CBC
HCT: 29.8 % — ABNORMAL LOW (ref 36.0–46.0)
Hemoglobin: 10.1 g/dL — ABNORMAL LOW (ref 12.0–15.0)
MCH: 26 pg (ref 26.0–34.0)
MCHC: 33.9 g/dL (ref 30.0–36.0)
MCV: 76.6 fL — ABNORMAL LOW (ref 80.0–100.0)
Platelets: 167 10*3/uL (ref 150–400)
RBC: 3.89 MIL/uL (ref 3.87–5.11)
RDW: 13.8 % (ref 11.5–15.5)
WBC: 4.1 10*3/uL (ref 4.0–10.5)
nRBC: 0 % (ref 0.0–0.2)

## 2023-10-17 LAB — MAGNESIUM: Magnesium: 1.3 mg/dL — ABNORMAL LOW (ref 1.7–2.4)

## 2023-10-17 MED ORDER — CHLORHEXIDINE GLUCONATE CLOTH 2 % EX PADS
6.0000 | MEDICATED_PAD | Freq: Every day | CUTANEOUS | Status: DC
  Administered 2023-10-17 – 2023-10-18 (×2): 6 via TOPICAL

## 2023-10-17 MED ORDER — MAGNESIUM OXIDE -MG SUPPLEMENT 400 (240 MG) MG PO TABS
400.0000 mg | ORAL_TABLET | Freq: Two times a day (BID) | ORAL | Status: DC
  Administered 2023-10-17: 400 mg via ORAL
  Filled 2023-10-17 (×3): qty 1

## 2023-10-17 MED ORDER — SUCRALFATE 1 G PO TABS
1.0000 g | ORAL_TABLET | Freq: Once | ORAL | Status: AC
  Administered 2023-10-17: 1 g via ORAL
  Filled 2023-10-17: qty 1

## 2023-10-17 MED ORDER — ADULT MULTIVITAMIN W/MINERALS CH
1.0000 | ORAL_TABLET | Freq: Every day | ORAL | Status: DC
  Filled 2023-10-17: qty 1

## 2023-10-17 MED ORDER — MAGNESIUM SULFATE 4 GM/100ML IV SOLN
4.0000 g | Freq: Once | INTRAVENOUS | Status: AC
Start: 2023-10-17 — End: 2023-10-17
  Administered 2023-10-17: 4 g via INTRAVENOUS
  Filled 2023-10-17: qty 100

## 2023-10-17 MED ORDER — ENSURE ENLIVE PO LIQD
237.0000 mL | Freq: Two times a day (BID) | ORAL | Status: DC
  Administered 2023-10-18: 237 mL via ORAL

## 2023-10-17 MED ORDER — ORAL CARE MOUTH RINSE: 15.0000 mL | OROMUCOSAL | Status: DC | PRN

## 2023-10-17 MED ORDER — SODIUM CHLORIDE 0.9% FLUSH: 10.0000 mL | INTRAVENOUS | Status: DC | PRN

## 2023-10-17 MED ORDER — SODIUM CHLORIDE 0.9% FLUSH
10.0000 mL | Freq: Two times a day (BID) | INTRAVENOUS | Status: DC
  Administered 2023-10-17 – 2023-10-18 (×3): 10 mL

## 2023-10-17 NOTE — Assessment & Plan Note (Signed)
-   We will continue anti-hypertensive therapy. 

## 2023-10-17 NOTE — Assessment & Plan Note (Addendum)
-   We will do PPI therapy and Carafate.

## 2023-10-17 NOTE — Assessment & Plan Note (Signed)
-   We will continue Lyrica. 

## 2023-10-17 NOTE — Assessment & Plan Note (Signed)
-   We will continue her Linzess and Amitiza.

## 2023-10-17 NOTE — Assessment & Plan Note (Signed)
-   Pain management will be provided.  Cardiac enzymes were negative.

## 2023-10-17 NOTE — Progress Notes (Signed)
Patient refused Klor Con and Mag-Ox. Dr. Arville Care aware.

## 2023-10-17 NOTE — Assessment & Plan Note (Addendum)
-   The patient admitted to a medical telemetry observation bed. - Will aggressively replace her potassium. - Potassium level will be followed. - This is associated with borderline magnesium and it was replaced. - This is likely contributing to her generalized weakness and muscle cramps.

## 2023-10-17 NOTE — Progress Notes (Signed)
PROGRESS NOTE    [name]  ZOX:[id number] DOB: [birth year] DOA: 10/16/2023 PCP: Dione Housekeeper, MD    Brief Narrative:  42 y.o. Caucasian female with medical history significant for cerebral palsy and history of hypokalemia and hypomagnesemia, who presented to the emergency room with acute onset of right chest Port-A-Cath pain with generalized weakness and muscle cramps mainly in both thighs.  She admitted to nausea without vomiting or diarrhea.  She has been having indigestion.  She admitted to chills without fever over the last couple nights.    Assessment & Plan:   Principal Problem:   Hypokalemia Active Problems:   Chest wall pain   IBS (irritable bowel syndrome)   Essential hypertension   GERD without esophagitis   Peripheral neuropathy  Hypokalemia Hypomagnesemia. Generalized weakness Muscle cramping Patient's generalized weakness and muscle cramps are likely secondary to electrolyte deficiencies.  Apparently there was some issues accessing patient's Port-A-Cath at home.  Port has been accessed without issue by our IV team here in the hospital. Plan: Monitor and replace electrolytes as needed  Chest wall pain - Pain management will be provided.  Cardiac enzymes were negative.   IBS (irritable bowel syndrome) - We will continue her Linzess and Amitiza.   GERD without esophagitis - We will do PPI therapy and Carafate.   Essential hypertension - We will continue antihypertensive therapy.   Peripheral neuropathy - We will continue Lyrica.  DVT prophylaxis: SQ Lovenox Code Status: Full Family Communication:Mother Tammy [phone] on 11/4 Disposition Plan: Status is: Observation The patient will require care spanning > 2 midnights and should be moved to inpatient because: Severe electrolyte deficiencies resulting in generalized weakness and muscle cramps   Level of care: Telemetry Medical  Consultants:  None  Procedures:   None  Antimicrobials: None   Subjective: Seen and examined.  Resting in bed.  No visible distress  Objective: Vitals:   10/17/23 0042 10/17/23 0237 10/17/23 0259 10/17/23 0814  BP: 125/84 101/73  121/79  Pulse: 88 88  85  Resp: 16 16  18   Temp: 98.1 F (36.7 C) 97.7 F (36.5 C)  98.3 F (36.8 C)  TempSrc: Oral Oral  Oral  SpO2: 100% 100%  98%  Weight:   41 kg   Height:   5\' 2"  (1.575 m)     Intake/Output Summary (Last 24 hours) at 10/17/2023 1327 Last data filed at 10/17/2023 1148 Gross per 24 hour  Intake 999.81 ml  Output --  Net 999.81 ml   Filed Weights   10/17/23 0259  Weight: 41 kg    Examination:  General exam: NAD.  Appears frail Respiratory system: Clear to auscultation. Respiratory effort normal. Cardiovascular system: S1-2, RRR, no murmurs, no pedal edema Gastrointestinal system: Thin, soft, NT/ND, normal bowel sounds Central nervous system: Alert.  Unable to assess orientation secondary to cerebral palsy. Extremities: Symmetric 5 x 5 power. Skin: No rashes, lesions or ulcers Psychiatry: Judgement and insight appear normal. Mood & affect appropriate.     Data Reviewed: I have personally reviewed following labs and imaging studies  CBC: Recent Labs  Lab 10/16/23 2230 10/17/23 0420  WBC 4.5 4.1  NEUTROABS 2.8  --   HGB 10.1* 10.1*  HCT 30.1* 29.8*  MCV 78.6* 76.6*  PLT 151 167   Basic Metabolic Panel: Recent Labs  Lab 10/16/23 1727 10/16/23 2230 10/17/23 0420 10/17/23 0901  NA 139  --  142  --   K 2.5*  --  3.8  --  CL 102  --  105  --   CO2 25  --  27  --   GLUCOSE 103*  --  91  --   BUN 16  --  12  --   CREATININE 0.78  --  0.59  --   CALCIUM 9.1  --  9.0  --   MG  --  1.8  --  1.3*   GFR: Estimated Creatinine Clearance: 59.9 mL/min (by C-G formula based on SCr of 0.59 mg/dL). Liver Function Tests: Recent Labs  Lab 10/16/23 1727  AST 22  ALT 29  ALKPHOS 65  BILITOT 0.8  PROT 6.6  ALBUMIN 3.7   No results for  input(s): "LIPASE", "AMYLASE" in the last 168 hours. No results for input(s): "AMMONIA" in the last 168 hours. Coagulation Profile: No results for input(s): "INR", "PROTIME" in the last 168 hours. Cardiac Enzymes: No results for input(s): "CKTOTAL", "CKMB", "CKMBINDEX", "TROPONINI" in the last 168 hours. BNP (last 3 results) No results for input(s): "PROBNP" in the last 8760 hours. HbA1C: No results for input(s): "HGBA1C" in the last 72 hours. CBG: No results for input(s): "GLUCAP" in the last 168 hours. Lipid Profile: No results for input(s): "CHOL", "HDL", "LDLCALC", "TRIG", "CHOLHDL", "LDLDIRECT" in the last 72 hours. Thyroid Function Tests: No results for input(s): "TSH", "T4TOTAL", "FREET4", "T3FREE", "THYROIDAB" in the last 72 hours. Anemia Panel: No results for input(s): "VITAMINB12", "FOLATE", "FERRITIN", "TIBC", "IRON", "RETICCTPCT" in the last 72 hours. Sepsis Labs: No results for input(s): "PROCALCITON", "LATICACIDVEN" in the last 168 hours.  No results found for this or any previous visit (from the past 240 hour(s)).       Radiology Studies: DG Chest 2 View  Result Date: 10/16/2023 CLINICAL DATA:  Chest pain EXAM: CHEST - 2 VIEW COMPARISON:  09/19/2023 FINDINGS: Lungs are clear.  No pleural effusion or pneumothorax. The heart is normal in size.  Left subclavian pacemaker. Mild degenerative changes of the upper lumbar spine. Median sternotomy. IMPRESSION: Normal chest radiographs. Electronically Signed   By: Charline Bills [name]   On: 10/16/2023 22:02        Scheduled Meds:  apixaban  5 mg Oral BID   bisacodyl  10 mg Oral BID   Chlorhexidine Gluconate Cloth  6 each Topical Daily   cholecalciferol  2,000 Units Oral Daily   cyanocobalamin  1,000 mcg Oral Daily   feeding supplement  237 mL Oral BID BM   linaclotide  290 mcg Oral Daily   magnesium oxide  400 mg Oral BID   methylphenidate  20 mg Oral TID   midodrine  10 mg Oral TID WC   [START ON 10/18/2023]  multivitamin with minerals  1 tablet Oral Daily   pantoprazole  40 mg Oral Daily   polyethylene glycol  17 g Oral BID   potassium chloride  40 mEq Oral Once   pregabalin  150 mg Oral BID   sodium chloride flush  10-40 mL Intracatheter Q12H   sucralfate  1 g Oral QID   [START ON 10/20/2023] Vitamin D (Ergocalciferol)  50,000 Units Oral Q7 days   Continuous Infusions:  0.9 % NaCl with KCl 20 mEq / L Stopped (10/17/23 1122)     LOS: 0 days     Tresa Moore, MD [facility]   If 7PM-7AM, please contact night-coverage  10/17/2023, 1:27 PM

## 2023-10-17 NOTE — Consult Note (Signed)
PHARMACY CONSULT NOTE - ELECTROLYTES  Pharmacy Consult for Electrolyte Monitoring and Replacement   Recent Labs: Height: 5\' 2"  (157.5 cm) Weight: 41 kg (90 lb 6.2 oz) IBW/kg (Calculated) : 50.1 Estimated Creatinine Clearance: 59.9 mL/min (by C-G formula based on SCr of 0.59 mg/dL). Potassium (mmol/L)  Date Value  10/17/2023 3.8  01/19/2015 3.4 (L)   Magnesium (mg/dL)  Date Value  16/09/9603 1.3 (L)  01/19/2015 1.0 (L)   Calcium (mg/dL)  Date Value  54/08/8118 9.0   Calcium, Total (mg/dL)  Date Value  14/78/2956 8.3 (L)   Albumin (g/dL)  Date Value  21/30/8657 3.7  06/14/2014 4.0   Phosphorus (mg/dL)  Date Value  84/69/6295 3.6   Sodium (mmol/L)  Date Value  10/17/2023 142  01/19/2015 142   Corrected Ca: 9.2 mg/dL  Assessment  [name] is a 42 y.o. female presenting with hypokalemia. PMH significant for cerenral palsy, electrolute abnormalities, and POTS. Pharmacy has been consulted to monitor and replace electrolytes.  Diet: Heart Diet MIVF: NS w/ 20 mEq KCl @ 100 mL/hr Pertinent medications: MagOx 400 mg po BID  Goal of Therapy: Electrolytes WNL  Plan:  Mg 1.3, give MagSulf 4 g IV x 1 Check BMP, Mg, Phos with AM labs  Thank you for allowing pharmacy to be a part of this patient's care.  Barrie Folk, PharmD Clinical Pharmacist 10/17/2023 10:38 AM

## 2023-10-17 NOTE — Evaluation (Signed)
Physical Therapy Evaluation Patient Details Name: [name] MRN: [id number] DOB: 04/06/1981 Today's Date: 10/17/2023  History of Present Illness  Pt is a 42 y.o. female with medical history significant for cerebral palsy and history of hypokalemia and hypomagnesemia, pacemaker, HTN, who presented to the emergency room with acute onset of right chest Port-A-Cath pain with generalized weakness and muscle cramps mainly in both thighs.   Clinical Impression  Pt alert, oriented to self but difficult to understand due to garbled speech. Did endorse back pain once sitting EOB. Pt reported at baseline she ambulates on her knees has a power wheelchair, and needs assistance with dressing from her mom, but that she does the laundry at home. Information verified with pt's mother with pt permission.  She was able to sit EOB with minA, provided but not necessarily required. Sit <> Stand with minAx2 via handheld assist for safety. She was able to stand pivot with minA for steadying during pericare from RN. Up in recliner with needs in reach. Next session pt to attempt ambulation with PT.  Overall the patient demonstrated deficits (see "PT Problem List") that impede the patient's functional abilities, safety, and mobility and would benefit from skilled PT intervention.        If plan is discharge home, recommend the following: A little help with walking and/or transfers;A little help with bathing/dressing/bathroom;Assistance with cooking/housework;Assist for transportation;Help with stairs or ramp for entrance;Direct supervision/assist for medications management   Can travel by private vehicle        Equipment Recommendations None recommended by PT  Recommendations for Other Services       Functional Status Assessment Patient has had a recent decline in their functional status and demonstrates the ability to make significant improvements in function in a reasonable and predictable amount of time.      Precautions / Restrictions Precautions Precautions: Fall Restrictions Weight Bearing Restrictions: No      Mobility  Bed Mobility Overal bed mobility: Needs Assistance Bed Mobility: Supine to Sit     Supine to sit: Min assist     General bed mobility comments: handheld assist provided but not necessarily required    Transfers Overall transfer level: Needs assistance Equipment used: 2 person hand held assist Transfers: Bed to chair/wheelchair/BSC   Stand pivot transfers: Min assist, +2 safety/equipment         General transfer comment: minA for steadying during pericare from RN    Ambulation/Gait               General Gait Details: deferred  Stairs            Wheelchair Mobility     Tilt Bed    Modified Rankin (Stroke Patients Only)       Balance Overall balance assessment: Needs assistance Sitting-balance support: Feet supported Sitting balance-Leahy Scale: Fair       Standing balance-Leahy Scale: Poor                               Pertinent Vitals/Pain Pain Assessment Pain Assessment: Faces Faces Pain Scale: Hurts little more Pain Location: back Pain Descriptors / Indicators: Aching, Grimacing, Guarding Pain Intervention(s): Limited activity within patient's tolerance, Monitored during session, Repositioned    Home Living Family/patient expects to be discharged to:: Private residence Living Arrangements: Parent   Type of Home: House Home Access: Ramped entrance       Home Layout: One level Home Equipment: Grab  bars - tub/shower;Grab bars - toilet;Wheelchair Customer service manager (4 wheels)      Prior Function                 ADLs Comments: assists for bathing per pt request (arm pits), family helps with socks and bra     Extremity/Trunk Assessment   Upper Extremity Assessment Upper Extremity Assessment: Defer to OT evaluation    Lower Extremity Assessment Lower Extremity Assessment: Generalized  weakness (little to no muscle activation bilaterally below knees)       Communication      Cognition Arousal: Alert Behavior During Therapy: WFL for tasks assessed/performed                                   General Comments: difficult to assess due to garbled speech, but oriented to self, and PLOF gathered verified with family and correct        General Comments      Exercises     Assessment/Plan    PT Assessment Patient needs continued PT services  PT Problem List Decreased strength;Decreased activity tolerance;Decreased balance;Decreased mobility       PT Treatment Interventions DME instruction;Neuromuscular re-education;Gait training;Stair training;Patient/family education;Functional mobility training;Therapeutic activities;Therapeutic exercise;Balance training    PT Goals (Current goals can be found in the Care Plan section)  Acute Rehab PT Goals Patient Stated Goal: to feel better PT Goal Formulation: With patient Time For Goal Achievement: 10/31/23 Potential to Achieve Goals: Good    Frequency Min 1X/week     Co-evaluation               AM-PAC PT "6 Clicks" Mobility  Outcome Measure Help needed turning from your back to your side while in a flat bed without using bedrails?: A Little Help needed moving from lying on your back to sitting on the side of a flat bed without using bedrails?: A Little Help needed moving to and from a bed to a chair (including a wheelchair)?: A Little Help needed standing up from a chair using your arms (e.g., wheelchair or bedside chair)?: A Little Help needed to walk in hospital room?: A Lot Help needed climbing 3-5 steps with a railing? : Total 6 Click Score: 15    End of Session Equipment Utilized During Treatment: Gait belt Activity Tolerance: Patient tolerated treatment well Patient left: in chair;with nursing/sitter in room;with chair alarm set;with call bell/phone within reach Nurse Communication:  Mobility status PT Visit Diagnosis: Other abnormalities of gait and mobility (R26.89);Muscle weakness (generalized) (M62.81)    Time: 1308-6578 PT Time Calculation (min) (ACUTE ONLY): 17 min   Charges:   PT Evaluation $PT Eval Low Complexity: 1 Low PT Treatments $Therapeutic Activity: 8-22 mins PT General Charges $$ ACUTE PT VISIT: 1 Visit       Olga Coaster PT, DPT 4:06 PM,10/17/23

## 2023-10-18 DIAGNOSIS — E876 Hypokalemia: Secondary | ICD-10-CM | POA: Diagnosis not present

## 2023-10-18 LAB — BASIC METABOLIC PANEL
Anion gap: 10 (ref 5–15)
BUN: 10 mg/dL (ref 6–20)
CO2: 22 mmol/L (ref 22–32)
Calcium: 8.4 mg/dL — ABNORMAL LOW (ref 8.9–10.3)
Chloride: 107 mmol/L (ref 98–111)
Creatinine, Ser: 0.56 mg/dL (ref 0.44–1.00)
GFR, Estimated: >60 mL/min (ref >60)
Glucose, Bld: 86 mg/dL (ref 70–99)
Potassium: 3.5 mmol/L (ref 3.5–5.1)
Sodium: 139 mmol/L (ref 135–145)

## 2023-10-18 LAB — PHOSPHORUS: Phosphorus: 2.7 mg/dL (ref 2.5–4.6)

## 2023-10-18 LAB — MAGNESIUM: Magnesium: 1.6 mg/dL — ABNORMAL LOW (ref 1.7–2.4)

## 2023-10-18 MED ORDER — MAGNESIUM SULFATE 2 GM/50ML IV SOLN
2.0000 g | Freq: Once | INTRAVENOUS | Status: AC
  Administered 2023-10-18: 2 g via INTRAVENOUS
  Filled 2023-10-18: qty 50

## 2023-10-18 NOTE — Plan of Care (Signed)

## 2023-10-18 NOTE — Consult Note (Signed)
PHARMACY CONSULT NOTE - ELECTROLYTES  Pharmacy Consult for Electrolyte Monitoring and Replacement   Recent Labs: Height: 5\' 2"  (157.5 cm) Weight: 42.3 kg (93 lb 4.1 oz) IBW/kg (Calculated) : 50.1 Estimated Creatinine Clearance: 61.8 mL/min (by C-G formula based on SCr of 0.56 mg/dL). Potassium (mmol/L)  Date Value  10/18/2023 3.5  01/19/2015 3.4 (L)   Magnesium (mg/dL)  Date Value  40/98/1191 1.6 (L)  01/19/2015 1.0 (L)   Calcium (mg/dL)  Date Value  47/82/9562 8.4 (L)   Calcium, Total (mg/dL)  Date Value  13/07/6577 8.3 (L)   Albumin (g/dL)  Date Value  46/96/2952 3.7  06/14/2014 4.0   Phosphorus (mg/dL)  Date Value  84/13/2440 2.7   Sodium (mmol/L)  Date Value  10/18/2023 139  01/19/2015 142    Assessment  [name] is a 42 y.o. female presenting with hypokalemia. PMH significant for cerenral palsy, electrolute abnormalities, and POTS. Pharmacy has been consulted to monitor and replace electrolytes.  Diet: Heart Diet ; Ensures MIVF: off Pertinent medications: MagOx 400 mg po BID  Goal of Therapy: Electrolytes WNL  Plan:  Mg 1.3 > 1.6 after replacement yesterday. Will order Magnesium sulfate IV 2 grams x 1.  Check BMP, Mg, Phos with AM labs  Thank you for allowing pharmacy to be a part of this patient's care.  Elliot Gurney, PharmD, BCPS Clinical Pharmacist  10/18/2023 7:24 AM

## 2023-10-18 NOTE — Discharge Summary (Signed)
Physician Discharge Summary  [name] ZOX:[id number] DOB: 10/21/1981 DOA: 10/16/2023  PCP: Dione Housekeeper, MD  Admit date: 10/16/2023 Discharge date: 10/18/2023  Admitted From: Home Disposition:  Home with home health  Recommendations for Outpatient Follow-up:  Follow up with PCP in 1-2 weeks   Home Health:Yes PT Equipment/Devices:None   Discharge Condition:Stable  CODE STATUS:FULL  Diet recommendation: Reg  Brief/Interim Summary:  42 y.o. Caucasian female with medical history significant for cerebral palsy and history of hypokalemia and hypomagnesemia, who presented to the emergency room with acute onset of right chest Port-A-Cath pain with generalized weakness and muscle cramps mainly in both thighs.  She admitted to nausea without vomiting or diarrhea.  She has been having indigestion.  She admitted to chills without fever over the last couple nights.     Discharge Diagnoses:  Principal Problem:   Hypokalemia Active Problems:   Chest wall pain   IBS (irritable bowel syndrome)   Essential hypertension   GERD without esophagitis   Peripheral neuropathy     Hypokalemia Hypomagnesemia. Generalized weakness Muscle cramping Patient's generalized weakness and muscle cramps are likely secondary to electrolyte deficiencies.  Apparently there was some issues accessing patient's Port-A-Cath at home.  Port has been accessed without issue by our IV team here in the hospital. Plan: Monitor and replace electrolytes as needed Port functioning at time of dc, will de-access in prep for discharge  Discharge Instructions  Discharge Instructions     Diet - low sodium heart healthy   Complete by: As directed    Increase activity slowly   Complete by: As directed       Allergies as of 10/18/2023       Reactions   Duloxetine    Other reaction(s): Other (See Comments), Palpitations Other reaction(s): Hallucinations, Hyperactive behavior (finding), Other (See  Comments) Stressed out   Flecainide    Other reaction(s): Other (See Comments) Other reaction(s): Hypomagnesemia (disorder), Other (See Comments) Other Reaction: QUESTION HYPOMAGNESEMIA   Fluoxetine    Other reaction(s): Nausea And Vomiting, Other (See Comments) Other reaction(s): Other (See Comments) Other Reaction: INCREASED HR  causes irritability and irrational behavior "I go crazy"   Gabapentin    Goes crazy Other reaction(s): Other (See Comments) Other reaction(s): Hyperactive behavior (finding), Mental Status Changes (intolerance), Other (See Comments) Goes crazy Hallucinations   Baclofen    Other reaction(s): Other (See Comments), Other (See Comments) Weakness  Other reaction(s): Other (See Comments) Weakness and loose Weakness  Other reaction(s):  Weakness    Cephalosporins    Other reaction(s): Hives, Rash Received pre-op vancomycin and cefazolin 07/20/16. Developed urticarial rash on bilateral lower extremities + slight periorbital swelling. Treated with diphenhydramine. Received pre-op vancomycin and cefazolin 07/20/16. Developed urticarial rash on bilateral lower extremities + slight periorbital swelling. Treated with diphenhydramine. Received pre-op vancomycin and cefazolin 07/20/16. Developed urticarial rash on bilateral lower extremities + slight periorbital swelling. Treated with diphenhydramine.   Sulfasalazine    Other reaction(s): Nausea And Vomiting   Sulfa Antibiotics Nausea And Vomiting   Vancomycin    Other reaction(s): Hives Received pre-op vancomycin and cefazolin 07/20/16. Developed urticarial rash on bilateral lower extremities + slight periorbital swelling. Treated with diphenhydramine. Received pre-op vancomycin and cefazolin 07/20/16. Developed urticarial rash on bilateral lower extremities + slight periorbital swelling. Treated with diphenhydramine. Received pre-op vancomycin and cefazolin 07/20/16. Developed urticarial rash on bilateral lower extremities +  slight periorbital swelling. Treated with diphenhydramine.   Cefazolin Rash   Tolerated ceftriaxone 2023 and cephalexin 2022  Medication List     STOP taking these medications    metoCLOPramide 10 MG tablet Commonly known as: REGLAN       TAKE these medications    acetaminophen 325 MG tablet Commonly known as: TYLENOL Take 2 tablets (650 mg total) by mouth every 6 (six) hours as needed for mild pain (pain score 1-3) or fever.   bisacodyl 10 MG suppository Commonly known as: DULCOLAX Place 1 suppository (10 mg total) rectally daily as needed for severe constipation.   bisacodyl 5 MG EC tablet Commonly known as: DULCOLAX Take 2 tablets (10 mg total) by mouth 2 (two) times daily.   cyanocobalamin 1000 MCG tablet Commonly known as: VITAMIN B12 Take 1,000 mcg by mouth daily.   D2000 Ultra Strength 50 MCG (2000 UT) Caps Generic drug: Cholecalciferol Take 1 tablet by mouth daily.   Eliquis 5 MG Tabs tablet Generic drug: apixaban Take 5 mg by mouth 2 (two) times daily.   famotidine 40 MG tablet Commonly known as: PEPCID Take 1 tablet (40 mg total) by mouth at bedtime as needed for heartburn.   heparin lock flush 100 UNIT/ML Soln injection Inject 5 mL (500 Units total) as directed as needed for line care. Flush using SASH (saline, administer  IV, saline, heparin) method as directed. Flush IV catheter with heparin after the last saline flush.  Use syringe ONE TIME only then discard. Storage: Room Temperature.   Linzess 290 MCG Caps capsule Generic drug: linaclotide Take 290 mcg by mouth daily.   magnesium 84 MG ( ) Tbcr SR tablet Commonly known as: MAGTAB Take 936 mg by mouth 3 (three) times daily.   magnesium chloride 200 MG/ML Soln Inject 20 mLs (4,000 mg total) as directed 2 (two) times a week. 4gm MgSulfate on 1000 ml LR per home infusion   methocarbamol 500 MG tablet Commonly known as: ROBAXIN Take 500-1,000 mg by mouth every 6 (six) hours as  needed (for back spasms).   midodrine 10 MG tablet Commonly known as: PROAMATINE Take 1 tablet (10 mg total) by mouth 3 (three) times daily with meals. Hold if Systolic BP >110   montelukast 10 MG tablet Commonly known as: SINGULAIR Take 10 mg by mouth daily as needed (allergies).   pantoprazole 40 MG tablet Commonly known as: PROTONIX Take 40 mg by mouth daily.   polyethylene glycol 17 g packet Commonly known as: MIRALAX / GLYCOLAX Take 17 g by mouth 2 (two) times daily.   pregabalin 150 MG capsule Commonly known as: LYRICA Take 150 mg by mouth in the morning, at noon, and at bedtime.   Premarin vaginal cream Generic drug: conjugated estrogens Place 0.5 g vaginally 2 (two) times a week.   Ritalin 20 MG tablet Generic drug: methylphenidate Take 20 mg by mouth 3 (three) times daily. Take 1 tablet (20 mg) TID at 0800, 1200 & 2100-2200   sucralfate 1 g tablet Commonly known as: CARAFATE Take 1 g by mouth 4 (four) times daily.   traMADol 50 MG tablet Commonly known as: ULTRAM Take 50 mg by mouth every 6 (six) hours as needed for moderate pain.   Vitamin D (Ergocalciferol) 1.25 MG (50000 UNIT) Caps capsule Commonly known as: DRISDOL Take 1 capsule (50,000 Units total) by mouth every 7 (seven) days.        Allergies  Allergen Reactions   Duloxetine     Other reaction(s): Other (See Comments), Palpitations Other reaction(s): Hallucinations, Hyperactive behavior (finding), Other (See Comments) Stressed out  Flecainide     Other reaction(s): Other (See Comments) Other reaction(s): Hypomagnesemia (disorder), Other (See Comments) Other Reaction: QUESTION HYPOMAGNESEMIA     Fluoxetine     Other reaction(s): Nausea And Vomiting, Other (See Comments) Other reaction(s): Other (See Comments) Other Reaction: INCREASED HR  causes irritability and irrational behavior "I go crazy"   Gabapentin     Goes crazy Other reaction(s): Other (See Comments) Other  reaction(s): Hyperactive behavior (finding), Mental Status Changes (intolerance), Other (See Comments) Goes crazy Hallucinations   Baclofen     Other reaction(s): Other (See Comments), Other (See Comments) Weakness  Other reaction(s): Other (See Comments) Weakness and loose Weakness  Other reaction(s):  Weakness     Cephalosporins     Other reaction(s): Hives, Rash Received pre-op vancomycin and cefazolin 07/20/16. Developed urticarial rash on bilateral lower extremities + slight periorbital swelling. Treated with diphenhydramine. Received pre-op vancomycin and cefazolin 07/20/16. Developed urticarial rash on bilateral lower extremities + slight periorbital swelling. Treated with diphenhydramine. Received pre-op vancomycin and cefazolin 07/20/16. Developed urticarial rash on bilateral lower extremities + slight periorbital swelling. Treated with diphenhydramine.   Sulfasalazine     Other reaction(s): Nausea And Vomiting   Sulfa Antibiotics Nausea And Vomiting   Vancomycin     Other reaction(s): Hives Received pre-op vancomycin and cefazolin 07/20/16. Developed urticarial rash on bilateral lower extremities + slight periorbital swelling. Treated with diphenhydramine. Received pre-op vancomycin and cefazolin 07/20/16. Developed urticarial rash on bilateral lower extremities + slight periorbital swelling. Treated with diphenhydramine. Received pre-op vancomycin and cefazolin 07/20/16. Developed urticarial rash on bilateral lower extremities + slight periorbital swelling. Treated with diphenhydramine.   Cefazolin Rash    Tolerated ceftriaxone 2023 and cephalexin 2022    Consultations: None   Procedures/Studies: DG Chest 2 View  Result Date: 10/16/2023 CLINICAL DATA:  Chest pain EXAM: CHEST - 2 VIEW COMPARISON:  09/19/2023 FINDINGS: Lungs are clear.  No pleural effusion or pneumothorax. The heart is normal in size.  Left subclavian pacemaker. Mild degenerative changes of the upper lumbar spine.  Median sternotomy. IMPRESSION: Normal chest radiographs. Electronically Signed   By: Charline Bills [name]   On: 10/16/2023 22:02   DG Abd 1 View  Result Date: 09/26/2023 CLINICAL DATA:  Constipation. EXAM: ABDOMEN - 1 VIEW COMPARISON:  Radiograph 2 days ago.  CT 09/22/2023 FINDINGS: Diminished stool burden from prior exam, small volume of stool in the left colon. No small bowel distension. There is mild gaseous gastric distension. Cholecystectomy clips in the right upper quadrant. Abdominal wall battery pack again seen. No other interval change. IMPRESSION: 1. Small volume of colonic stool, diminished stool burden from prior exam. 2. Mild gaseous gastric distension, chronic. Electronically Signed   By: Narda Rutherford [name]   On: 09/26/2023 12:04   DG Abd 1 View  Result Date: 09/24/2023 CLINICAL DATA:  Fecal impaction at the rectum. EXAM: ABDOMEN - 1 VIEW COMPARISON:  One-view abdomen 09/20/2023. FINDINGS: Mild gaseous distension of colon is noted. Moderate stool is present in the distal sigmoid colon and rectum. No free air is present. Axial skeleton is unremarkable. IMPRESSION: Moderate stool in the distal sigmoid colon and rectum. Electronically Signed   By: Marin Roberts [name]   On: 09/24/2023 15:07   CT ABDOMEN PELVIS W CONTRAST  Result Date: 09/22/2023 CLINICAL DATA:  Left lower quadrant pain EXAM: CT ABDOMEN AND PELVIS WITH CONTRAST TECHNIQUE: Multidetector CT imaging of the abdomen and pelvis was performed using the standard protocol following bolus administration of intravenous contrast. RADIATION  DOSE REDUCTION: This exam was performed according to the departmental dose-optimization program which includes automated exposure control, adjustment of the mA and/or kV according to patient size and/or use of iterative reconstruction technique. CONTRAST:  OMNIPAQUE IOHEXOL 300 MG/ML  SOLN COMPARISON:  Radiograph 09/20/2023, CT 09/17/2023 FINDINGS: Lower chest: Lung bases demonstrate  no acute airspace disease Hepatobiliary: Cholecystectomy. Hepatic steatosis. No biliary dilatation Pancreas: No acute inflammatory process. Poorly visible distal body and tail of the pancreas which could be due to chronic atrophy or agenesis. Spleen: Normal in size without focal abnormality. Adrenals/Urinary Tract: Adrenal glands are normal. Kidneys show no hydronephrosis. Subcentimeter renal hypodensities too small to further characterize, no imaging follow-up is recommended. The bladder is unremarkable Stomach/Bowel: Moderate air distension of the stomach. There is no dilated small bowel. No convincing bowel wall thickening. Decreased stool burden compared to prior CT Vascular/Lymphatic: Nonaneurysmal aorta.  No suspicious lymph nodes Reproductive: Status post hysterectomy. No adnexal masses. Other: Negative for free air.  No significant ascites Musculoskeletal: No acute osseous abnormality. IMPRESSION: 1. No CT evidence for acute intra-abdominal or pelvic abnormality. 2. Hepatic steatosis. 3. Decreased stool burden compared to prior CT. Electronically Signed   By: Jasmine Pang [name]   On: 09/22/2023 21:34   DG Abd 1 View  Result Date: 09/20/2023 CLINICAL DATA:  [id number] Constipation [id number] EXAM: ABDOMEN - 1 VIEW COMPARISON:  08/20/2023. FINDINGS: The bowel gas pattern is non-obstructive. There is a loop of splenic flexure of colon exhibiting small amount of fecal material. Otherwise there is paucity of bowel gas. No evidence of pneumoperitoneum, within the limitations of a supine film. No acute osseous abnormalities. The soft tissues are within normal limits. Surgical changes, devices, tubes and lines: Again seen is a battery pack overlying the right upper quadrant with the lead extending superiorly out of the field of view. IMPRESSION: 1. Non-obstructive bowel gas pattern. Electronically Signed   By: Jules Schick [name]   On: 09/20/2023 09:17   DG Chest 1 View  Result Date: 09/19/2023 CLINICAL DATA:  Chest  pain.  Heart palpitations. EXAM: CHEST  1 VIEW COMPARISON:  07/26/2023.  CT chest 04/30/2023. FINDINGS: Right IJ Port-A-Cath terminates in the high right atrium. Pacemaker lead tip is in the right atrium. Epicardial pacer wires project over the right heart. Heart size normal. Lungs are hyperinflated but clear. No pleural fluid. IMPRESSION: Hyperinflation without acute finding. Electronically Signed   By: Leanna Battles [name]   On: 09/19/2023 18:17      Subjective: Seen and examined on the day of discharge.  Stable no distress.  Appropriate discharge home.  Discharge Exam: Vitals:   10/18/23 0411 10/18/23 0729  BP: 105/76 97/71  Pulse: 91 88  Resp: 17 16  Temp: (!) 97.5 F (36.4 C) 98.2 F (36.8 C)  SpO2: 100% 100%   Vitals:   10/17/23 2003 10/18/23 0411 10/18/23 0412 10/18/23 0729  BP: 125/79 105/76  97/71  Pulse: 86 91  88  Resp: 18 17  16   Temp: 98.2 F (36.8 C) (!) 97.5 F (36.4 C)  98.2 F (36.8 C)  TempSrc: Oral Oral    SpO2: 100% 100%  100%  Weight:   42.3 kg   Height:        General: Pt is alert, awake, not in acute distress Cardiovascular: RRR, S1/S2 +, no rubs, no gallops Respiratory: CTA bilaterally, no wheezing, no rhonchi Abdominal: Soft, NT, ND, bowel sounds + Extremities: no edema, no cyanosis    The results of significant  diagnostics from this hospitalization (including imaging, microbiology, ancillary and laboratory) are listed below for reference.     Microbiology: No results found for this or any previous visit (from the past 240 hour(s)).   Labs: BNP (last 3 results) No results for input(s): "BNP" in the last 8760 hours. Basic Metabolic Panel: Recent Labs  Lab 10/16/23 1727 10/16/23 2230 10/17/23 0420 10/17/23 0901 10/18/23 0434  NA 139  --  142  --  139  K 2.5*  --  3.8  --  3.5  CL 102  --  105  --  107  CO2 25  --  27  --  22  GLUCOSE 103*  --  91  --  86  BUN 16  --  12  --  10  CREATININE 0.78  --  0.59  --  0.56  CALCIUM 9.1   --  9.0  --  8.4*  MG  --  1.8  --  1.3* 1.6*  PHOS  --   --   --   --  2.7   Liver Function Tests: Recent Labs  Lab 10/16/23 1727  AST 22  ALT 29  ALKPHOS 65  BILITOT 0.8  PROT 6.6  ALBUMIN 3.7   No results for input(s): "LIPASE", "AMYLASE" in the last 168 hours. No results for input(s): "AMMONIA" in the last 168 hours. CBC: Recent Labs  Lab 10/16/23 2230 10/17/23 0420  WBC 4.5 4.1  NEUTROABS 2.8  --   HGB 10.1* 10.1*  HCT 30.1* 29.8*  MCV 78.6* 76.6*  PLT 151 167   Cardiac Enzymes: No results for input(s): "CKTOTAL", "CKMB", "CKMBINDEX", "TROPONINI" in the last 168 hours. BNP: Invalid input(s): "POCBNP" CBG: No results for input(s): "GLUCAP" in the last 168 hours. D-Dimer No results for input(s): "DDIMER" in the last 72 hours. Hgb A1c No results for input(s): "HGBA1C" in the last 72 hours. Lipid Profile No results for input(s): "CHOL", "HDL", "LDLCALC", "TRIG", "CHOLHDL", "LDLDIRECT" in the last 72 hours. Thyroid function studies No results for input(s): "TSH", "T4TOTAL", "T3FREE", "THYROIDAB" in the last 72 hours.  Invalid input(s): "FREET3" Anemia work up No results for input(s): "VITAMINB12", "FOLATE", "FERRITIN", "TIBC", "IRON", "RETICCTPCT" in the last 72 hours. Urinalysis    Component Value Date/Time   COLORURINE YELLOW (A) 09/21/2023 1856   APPEARANCEUR HAZY (A) 09/21/2023 1856   APPEARANCEUR Hazy 02/11/2014 1737   LABSPEC 1.011 09/21/2023 1856   LABSPEC 1.020 02/11/2014 1737   PHURINE 5.0 09/21/2023 1856   GLUCOSEU NEGATIVE 09/21/2023 1856   GLUCOSEU Negative 02/11/2014 1737   HGBUR NEGATIVE 09/21/2023 1856   BILIRUBINUR NEGATIVE 09/21/2023 1856   BILIRUBINUR Negative 02/11/2014 1737   KETONESUR 5 (A) 09/21/2023 1856   PROTEINUR NEGATIVE 09/21/2023 1856   UROBILINOGEN 0.2 04/18/2014 1640   NITRITE NEGATIVE 09/21/2023 1856   LEUKOCYTESUR NEGATIVE 09/21/2023 1856   LEUKOCYTESUR Negative 02/11/2014 1737   Sepsis Labs Recent Labs  Lab  10/16/23 2230 10/17/23 0420  WBC 4.5 4.1   Microbiology No results found for this or any previous visit (from the past 240 hour(s)).   Time coordinating discharge: Over 30 minutes  SIGNED:   Tresa Moore, MD  [facility] 10/18/2023, 1:27 PM Pager   If 7PM-7AM, please contact night-coverage

## 2023-10-18 NOTE — Evaluation (Signed)
Occupational Therapy Evaluation Patient Details Name: [name] MRN: [id number] DOB: 18-Jul-1981 Today's Date: 10/18/2023   History of Present Illness Pt is a 42 y.o. female with medical history significant for cerebral palsy and history of hypokalemia and hypomagnesemia, pacemaker, HTN, who presented to the emergency room with acute onset of right chest Port-A-Cath pain with generalized weakness and muscle cramps mainly in both thighs.   Clinical Impression   Pt was seen for OT evaluation this date. Prior to hospital admission, pt reports needing A for bathing and dressing. Pt does some IADLs with Mod I such as vacuuming and laundry from power WC. Pt walks on her knees and uses power wc at baseline. Pt lives with mom. Pt presents to acute OT demonstrating impaired ADL performance and functional mobility 2/2 (See OT problem list for additional functional deficits). Upon arrival to room pt supine in bed, agreeable to Tx. Rn giving meds at the beginning of the session. Pt completed supine>sit t/f with supervision. Pt completed bed to the chair t/f with min G by lowering herself to the floor and pushing through her arms to get into recliner chair. She returned to the bed with a stand pivot t/f and took 2 steps with Min G. Pt required Min A with BLE to get from sit>supine. Pt left supine in bed with call bell within reach and all needs met. RN notified of mobility status. Pt verbalized no need for HHOT but does want HHPT. Pt would benefit from skilled OT services to address noted impairments and functional limitations (see below for any additional details) in order to maximize safety and independence while minimizing falls risk and caregiver burden. Do not anticipate the need for follow up OT services upon acute hospital DC.        If plan is discharge home, recommend the following: A little help with walking and/or transfers;A little help with bathing/dressing/bathroom;Assistance with  cooking/housework;Assist for transportation;Help with stairs or ramp for entrance    Functional Status Assessment  Patient has had a recent decline in their functional status and demonstrates the ability to make significant improvements in function in a reasonable and predictable amount of time.  Equipment Recommendations  BSC/3in1    Recommendations for Other Services       Precautions / Restrictions Precautions Precautions: Fall Restrictions Weight Bearing Restrictions: No      Mobility Bed Mobility Overal bed mobility: Needs Assistance Bed Mobility: Supine to Sit, Sit to Supine     Supine to sit: Supervision Sit to supine: Min assist     Patient Response: Cooperative  Transfers Overall transfer level: Needs assistance Equipment used: None Transfers: Bed to chair/wheelchair/BSC   Stand pivot transfers: Supervision                Balance Overall balance assessment: Needs assistance Sitting-balance support: Feet supported Sitting balance-Leahy Scale: Fair       Standing balance-Leahy Scale: Poor                             ADL either performed or assessed with clinical judgement   ADL Overall ADL's : Needs assistance/impaired                                     Functional mobility during ADLs: Supervision/safety General ADL Comments: Pt completed a bed to chair t/f with Min G.  Vision Patient Visual Report: No change from baseline       Perception         Praxis         Pertinent Vitals/Pain Pain Assessment Pain Assessment: No/denies pain     Extremity/Trunk Assessment Upper Extremity Assessment Upper Extremity Assessment: Generalized weakness   Lower Extremity Assessment Lower Extremity Assessment: Generalized weakness       Communication Communication Communication: Other (comment) (garbled speech)   Cognition Arousal: Alert Behavior During Therapy: WFL for tasks assessed/performed                                          General Comments       Exercises     Shoulder Instructions      Home Living Family/patient expects to be discharged to:: Private residence Living Arrangements: Parent   Type of Home: House Home Access: Ramped entrance     Home Layout: One level     Bathroom Shower/Tub: Chief Strategy Officer: Standard Bathroom Accessibility: Yes   Home Equipment: Grab bars - tub/shower;Grab bars - toilet;Wheelchair - power;Rollator (4 wheels)          Prior Functioning/Environment Prior Level of Function : Needs assist             Mobility Comments: Pt walks on her knees at baseline. Pt uses Power WC. ADLs Comments: assists for bathing per pt request (arm pits), family helps with socks and bra. Pt does some IADL's with MOD I such as vaccuming and laundry from power wc        OT Problem List: Decreased strength;Decreased range of motion;Decreased activity tolerance;Impaired balance (sitting and/or standing)      OT Treatment/Interventions: Self-care/ADL training;Therapeutic exercise;Energy conservation;Therapeutic activities    OT Goals(Current goals can be found in the care plan section) Acute Rehab OT Goals Patient Stated Goal: to go home. OT Goal Formulation: With patient Time For Goal Achievement: 11/01/23 Potential to Achieve Goals: Good ADL Goals Pt Will Perform Grooming: with supervision;sitting Pt Will Transfer to Toilet: with supervision;bedside commode Pt Will Perform Toileting - Clothing Manipulation and hygiene: with supervision;sitting/lateral leans  OT Frequency: Min 1X/week    Co-evaluation              AM-PAC OT "6 Clicks" Daily Activity     Outcome Measure Help from another person eating meals?: A Little Help from another person taking care of personal grooming?: A Little Help from another person toileting, which includes using toliet, bedpan, or urinal?: A Little Help from another person  bathing (including washing, rinsing, drying)?: A Little Help from another person to put on and taking off regular upper body clothing?: A Little Help from another person to put on and taking off regular lower body clothing?: A Little 6 Click Score: 18   End of Session Nurse Communication: Mobility status  Activity Tolerance: Patient tolerated treatment well Patient left: in bed;with call bell/phone within reach;with bed alarm set  OT Visit Diagnosis: Unsteadiness on feet (R26.81);Other abnormalities of gait and mobility (R26.89);Muscle weakness (generalized) (M62.81)                Time: 1610-9604 OT Time Calculation (min): 35 min Charges:  OT General Charges $OT Visit: 1 Visit OT Evaluation $OT Eval Low Complexity: 1 Low OT Treatments $Therapeutic Activity: 23-37 mins  Butch Penny, SOT

## 2023-10-18 NOTE — Care Management Obs Status (Signed)
MEDICARE OBSERVATION STATUS NOTIFICATION   Patient Details  Name: [name] MRN: [id number] Date of Birth: 1981-06-17   Medicare Observation Status Notification Given:  Yes    Chapman Fitch, RN 10/18/2023, 1:42 PM

## 2023-10-18 NOTE — Plan of Care (Signed)
The patient has been discharged. IV and chest port has been removed. Education completed with [facility] RN picking up patient.  Problem: Education: Goal: Knowledge of General Education information will improve Description: Including pain rating scale, medication(s)/side effects and non-pharmacologic comfort measures 10/18/2023 1747 by Murriel Hopper, Donald Pore, RN Outcome: Completed/Met 10/18/2023 1223 by Murriel Hopper, Donald Pore, RN Outcome: Progressing   Problem: Health Behavior/Discharge Planning: Goal: Ability to manage health-related needs will improve 10/18/2023 1747 by Monica Becton, RN Outcome: Completed/Met 10/18/2023 1223 by Murriel Hopper, Donald Pore, RN Outcome: Progressing   Problem: Clinical Measurements: Goal: Ability to maintain clinical measurements within normal limits will improve 10/18/2023 1747 by Monica Becton, RN Outcome: Completed/Met 10/18/2023 1223 by Murriel Hopper, Donald Pore, RN Outcome: Progressing   Problem: Clinical Measurements: Goal: Ability to maintain clinical measurements within normal limits will improve 10/18/2023 1747 by Monica Becton, RN Outcome: Completed/Met 10/18/2023 1223 by Murriel Hopper, Donald Pore, RN Outcome: Progressing Goal: Will remain free from infection 10/18/2023 1747 by Monica Becton, RN Outcome: Completed/Met 10/18/2023 1223 by Murriel Hopper, Donald Pore, RN Outcome: Progressing Goal: Diagnostic test results will improve 10/18/2023 1747 by Monica Becton, RN Outcome: Completed/Met 10/18/2023 1223 by Murriel Hopper, Donald Pore, RN Outcome: Progressing Goal: Respiratory complications will improve 10/18/2023 1747 by Monica Becton, RN Outcome: Completed/Met 10/18/2023 1223 by Murriel Hopper, Donald Pore, RN Outcome: Progressing Goal: Cardiovascular complication will be avoided 10/18/2023 1747 by Monica Becton, RN Outcome: Completed/Met 10/18/2023 1223 by Monica Becton, RN Outcome:  Progressing   Problem: Activity: Goal: Risk for activity intolerance will decrease 10/18/2023 1747 by Murriel Hopper, Donald Pore, RN Outcome: Completed/Met 10/18/2023 1223 by Murriel Hopper, Donald Pore, RN Outcome: Progressing   Problem: Nutrition: Goal: Adequate nutrition will be maintained 10/18/2023 1747 by Murriel Hopper, Donald Pore, RN Outcome: Completed/Met 10/18/2023 1223 by Murriel Hopper, Donald Pore, RN Outcome: Progressing   Problem: Coping: Goal: Level of anxiety will decrease 10/18/2023 1747 by Monica Becton, RN Outcome: Completed/Met 10/18/2023 1223 by Murriel Hopper, Donald Pore, RN Outcome: Progressing   Problem: Elimination: Goal: Will not experience complications related to bowel motility 10/18/2023 1747 by Monica Becton, RN Outcome: Completed/Met 10/18/2023 1223 by Murriel Hopper, Donald Pore, RN Outcome: Progressing Goal: Will not experience complications related to urinary retention 10/18/2023 1747 by Monica Becton, RN Outcome: Completed/Met 10/18/2023 1223 by Murriel Hopper, Donald Pore, RN Outcome: Progressing   Problem: Pain Management: Goal: General experience of comfort will improve 10/18/2023 1747 by Monica Becton, RN Outcome: Completed/Met 10/18/2023 1223 by Murriel Hopper, Donald Pore, RN Outcome: Progressing   Problem: Safety: Goal: Ability to remain free from injury will improve 10/18/2023 1747 by Monica Becton, RN Outcome: Completed/Met 10/18/2023 1223 by Murriel Hopper, Donald Pore, RN Outcome: Progressing   Problem: Skin Integrity: Goal: Risk for impaired skin integrity will decrease 10/18/2023 1747 by Monica Becton, RN Outcome: Completed/Met 10/18/2023 1223 by Murriel Hopper, Donald Pore, RN Outcome: Progressing   Problem: Acute Rehab PT Goals(only PT should resolve) Goal: Pt Will Go Supine/Side To Sit Outcome: Completed/Met Goal: Patient Will Transfer Sit To/From Stand Outcome: Completed/Met Goal: Pt Will  Ambulate Outcome: Completed/Met   Problem: Acute Rehab OT Goals (only OT should resolve) Goal: Pt. Will Perform Grooming Outcome: Completed/Met Goal: Pt. Will Transfer To Toilet Outcome: Completed/Met Goal: Pt. Will Perform Toileting-Clothing Manipulation Outcome: Completed/Met

## 2023-10-18 NOTE — Plan of Care (Signed)

## 2023-10-18 NOTE — TOC Initial Note (Signed)
Transition of Care [facility]) - Initial/Assessment Note    Patient Details  Name: [name] MRN: [id number] Date of Birth: 07/12/1981  [facility]) CM/SW Contact:    Chapman Fitch, RN Phone Number: 10/18/2023, 1:43 PM  Clinical Narrative:                  Patient to discharge home today Patient lives at home with mother Patient has electric WC in the home  Patient receives electrolyte infusion twice a week through [facility].  Spoke with RN Dorann Lodge at [facility] who confirms they do not need new orders as patient is in observation status.   Therapy recommending home health.  Patient interested in PT only.  Previous admission referral was made to [facility], however per Tresa Endo with [facility] when they reached out to patient services were declined. Patient states that she would like to use [facility] for PT.  Referral made to [facility] with [facility].  Patient will still receive nursing services through [facility] referral made to Jon with [facility] to be delivered to room prior to discharge. Juanita to transportation today around 430 pm  Expected Discharge Plan: Home w Home Health Services Barriers to Discharge: Barriers Resolved   Patient Goals and CMS Choice            Expected Discharge Plan and Services         Expected Discharge Date: 10/18/23                                    Prior Living Arrangements/Services                       Activities of Daily Living   ADL Screening (condition at time of admission) Independently performs ADLs?: No Does the patient have a NEW difficulty with bathing/dressing/toileting/self-feeding that is expected to last >3 days?: No Does the patient have a NEW difficulty with getting in/out of bed, walking, or climbing stairs that is expected to last >3 days?: No Does the patient have a NEW difficulty with communication that is expected to last >3 days?: No Is the patient deaf or have difficulty hearing?: No Does  the patient have difficulty seeing, even when wearing glasses/contacts?: No Does the patient have difficulty concentrating, remembering, or making decisions?: No  Permission Sought/Granted                  Emotional Assessment              Admission diagnosis:  Hypokalemia [E87.6] Chest wall pain [R07.89] Patient Active Problem List   Diagnosis Date Noted   Peripheral neuropathy 10/17/2023   Chest wall pain 10/17/2023   Electrolyte disturbance 09/19/2023   Palpitation 09/19/2023   Thrombocytopenia (HCC) 09/19/2023   Protein-calorie malnutrition, moderate (HCC) 09/19/2023   DVT (deep venous thrombosis) (HCC) 09/19/2023   Grade I hemorrhoids 12/13/2022   Constipation 12/11/2022   GI bleeding 12/10/2022   Essential hypertension 12/10/2022   IBS (irritable bowel syndrome) 12/10/2022   GERD without esophagitis 12/10/2022   Hypokalemia 12/10/2022   Rectal bleeding 12/10/2022   Influenza 10/25/2022   Right ovarian cyst 05/31/2015   Abdominal pain, chronic, right lower quadrant 05/23/2015   Hypomagnesemia 03/02/2014   Back pain 03/02/2014   PCP:  Dione Housekeeper, MD Pharmacy:   [facility] [phone] - [place], [place] - [address] [place] [address] AT [facility] OF  [address] [facility] [place] [address] Phone: [phone] Fax: [phone]  [name] [address] [place] [address] Phone: [phone] Fax: [phone]     Social Determinants of Health (SDOH) Social History: SDOH Screenings   Food Insecurity: No Food Insecurity (10/17/2023)  Housing: Low Risk  (10/17/2023)  Transportation Needs: No Transportation Needs (10/17/2023)  Utilities: Not At Risk (10/17/2023)  Financial Resource Strain: Low Risk  (07/12/2023)   Received from [facility]  Social Connections: Unknown (08/02/2022)   [facility] from [facility]  Stress: Stress Concern Present (07/20/2022)   Received from [facility], [facility]  Tobacco Use: Low Risk  (10/16/2023)   SDOH Interventions:     Readmission Risk Interventions     No data to display

## 2023-11-10 ENCOUNTER — Emergency Department: Payer: Medicare HMO

## 2023-11-10 ENCOUNTER — Observation Stay
Admission: EM | Admit: 2023-11-10 | Discharge: 2023-11-17 | Disposition: A | Discharge status: Home / Self Care | Payer: Medicare HMO | Attending: Obstetrics and Gynecology | Admitting: Obstetrics and Gynecology

## 2023-11-10 ENCOUNTER — Other Ambulatory Visit: Payer: Self-pay

## 2023-11-10 DIAGNOSIS — Z86718 Personal history of other venous thrombosis and embolism: Secondary | ICD-10-CM | POA: Insufficient documentation

## 2023-11-10 DIAGNOSIS — Z950 Presence of cardiac pacemaker: Secondary | ICD-10-CM | POA: Insufficient documentation

## 2023-11-10 DIAGNOSIS — Z7901 Long term (current) use of anticoagulants: Secondary | ICD-10-CM | POA: Insufficient documentation

## 2023-11-10 DIAGNOSIS — I959 Hypotension, unspecified: Secondary | ICD-10-CM | POA: Diagnosis not present

## 2023-11-10 DIAGNOSIS — G800 Spastic quadriplegic cerebral palsy: Secondary | ICD-10-CM | POA: Diagnosis not present

## 2023-11-10 DIAGNOSIS — I82409 Acute embolism and thrombosis of unspecified deep veins of unspecified lower extremity: Secondary | ICD-10-CM | POA: Diagnosis present

## 2023-11-10 DIAGNOSIS — D696 Thrombocytopenia, unspecified: Secondary | ICD-10-CM | POA: Diagnosis not present

## 2023-11-10 DIAGNOSIS — E876 Hypokalemia: Principal | ICD-10-CM | POA: Insufficient documentation

## 2023-11-10 DIAGNOSIS — W1830XA Fall on same level, unspecified, initial encounter: Secondary | ICD-10-CM | POA: Diagnosis not present

## 2023-11-10 DIAGNOSIS — K5900 Constipation, unspecified: Secondary | ICD-10-CM | POA: Diagnosis present

## 2023-11-10 DIAGNOSIS — R002 Palpitations: Secondary | ICD-10-CM | POA: Diagnosis not present

## 2023-11-10 DIAGNOSIS — E878 Other disorders of electrolyte and fluid balance, not elsewhere classified: Principal | ICD-10-CM | POA: Diagnosis present

## 2023-11-10 DIAGNOSIS — W19XXXA Unspecified fall, initial encounter: Secondary | ICD-10-CM

## 2023-11-10 DIAGNOSIS — I82729 Chronic embolism and thrombosis of deep veins of unspecified upper extremity: Secondary | ICD-10-CM

## 2023-11-10 DIAGNOSIS — G809 Cerebral palsy, unspecified: Secondary | ICD-10-CM | POA: Diagnosis present

## 2023-11-10 LAB — COMPREHENSIVE METABOLIC PANEL
ALT: 20 U/L (ref 0–44)
AST: 22 U/L (ref 15–41)
Albumin: 4 g/dL (ref 3.5–5.0)
Alkaline Phosphatase: 78 U/L (ref 38–126)
Anion gap: 12 (ref 5–15)
BUN: 19 mg/dL (ref 6–20)
CO2: 26 mmol/L (ref 22–32)
Calcium: 9.2 mg/dL (ref 8.9–10.3)
Chloride: 102 mmol/L (ref 98–111)
Creatinine, Ser: 0.76 mg/dL (ref 0.44–1.00)
GFR, Estimated: >60 mL/min (ref >60)
Glucose, Bld: 101 mg/dL — ABNORMAL HIGH (ref 70–99)
Potassium: 2.6 mmol/L — CL (ref 3.5–5.1)
Sodium: 140 mmol/L (ref 135–145)
Total Bilirubin: 0.6 mg/dL (ref <1.2)
Total Protein: 6.4 g/dL — ABNORMAL LOW (ref 6.5–8.1)

## 2023-11-10 LAB — BASIC METABOLIC PANEL
Anion gap: 9 (ref 5–15)
BUN: 16 mg/dL (ref 6–20)
CO2: 27 mmol/L (ref 22–32)
Calcium: 8.7 mg/dL — ABNORMAL LOW (ref 8.9–10.3)
Chloride: 104 mmol/L (ref 98–111)
Creatinine, Ser: 0.75 mg/dL (ref 0.44–1.00)
GFR, Estimated: >60 mL/min (ref >60)
Glucose, Bld: 148 mg/dL — ABNORMAL HIGH (ref 70–99)
Potassium: 3.7 mmol/L (ref 3.5–5.1)
Sodium: 140 mmol/L (ref 135–145)

## 2023-11-10 LAB — CBC
HCT: 33.2 % — ABNORMAL LOW (ref 36.0–46.0)
Hemoglobin: 10.9 g/dL — ABNORMAL LOW (ref 12.0–15.0)
MCH: 25.8 pg — ABNORMAL LOW (ref 26.0–34.0)
MCHC: 32.8 g/dL (ref 30.0–36.0)
MCV: 78.7 fL — ABNORMAL LOW (ref 80.0–100.0)
Platelets: 130 10*3/uL — ABNORMAL LOW (ref 150–400)
RBC: 4.22 MIL/uL (ref 3.87–5.11)
RDW: 15.2 % (ref 11.5–15.5)
WBC: 4.6 10*3/uL (ref 4.0–10.5)
nRBC: 0 % (ref 0.0–0.2)

## 2023-11-10 LAB — MAGNESIUM
Magnesium: 1.2 mg/dL — ABNORMAL LOW (ref 1.7–2.4)
Magnesium: 1.8 mg/dL (ref 1.7–2.4)

## 2023-11-10 LAB — CK: Total CK: 190 U/L (ref 38–234)

## 2023-11-10 LAB — TROPONIN I (HIGH SENSITIVITY): Troponin I (High Sensitivity): 3 ng/L (ref <18)

## 2023-11-10 MED ORDER — MAGNESIUM SULFATE 2 GM/50ML IV SOLN
2.0000 g | Freq: Once | INTRAVENOUS | Status: AC
  Administered 2023-11-10: 2 g via INTRAVENOUS
  Filled 2023-11-10: qty 50

## 2023-11-10 MED ORDER — ACETAMINOPHEN 500 MG PO TABS
1000.0000 mg | ORAL_TABLET | Freq: Once | ORAL | Status: AC
  Administered 2023-11-10: 1000 mg via ORAL
  Filled 2023-11-10: qty 2

## 2023-11-10 MED ORDER — VITAMIN D3 25 MCG (1000 UNIT) PO TABS
2000.0000 [IU] | ORAL_TABLET | Freq: Every day | ORAL | Status: DC
  Administered 2023-11-10 – 2023-11-17 (×8): 2000 [IU] via ORAL
  Filled 2023-11-10 (×16): qty 2

## 2023-11-10 MED ORDER — PANTOPRAZOLE SODIUM 40 MG PO TBEC
40.0000 mg | DELAYED_RELEASE_TABLET | Freq: Every day | ORAL | Status: DC
  Administered 2023-11-11 – 2023-11-17 (×6): 40 mg via ORAL
  Filled 2023-11-10 (×7): qty 1

## 2023-11-10 MED ORDER — POTASSIUM CHLORIDE 10 MEQ/100ML IV SOLN
10.0000 meq | INTRAVENOUS | Status: AC
  Administered 2023-11-10 (×2): 10 meq via INTRAVENOUS
  Filled 2023-11-10 (×2): qty 100

## 2023-11-10 MED ORDER — METHYLPHENIDATE HCL 20 MG PO TABS
20.0000 mg | ORAL_TABLET | ORAL | Status: DC
  Filled 2023-11-10: qty 1

## 2023-11-10 MED ORDER — ONDANSETRON HCL 4 MG/2ML IJ SOLN: 4.0000 mg | Freq: Four times a day (QID) | INTRAMUSCULAR | Status: DC | PRN

## 2023-11-10 MED ORDER — TRAMADOL HCL 50 MG PO TABS
50.0000 mg | ORAL_TABLET | Freq: Four times a day (QID) | ORAL | Status: DC | PRN
  Filled 2023-11-10: qty 1

## 2023-11-10 MED ORDER — POTASSIUM CHLORIDE CRYS ER 20 MEQ PO TBCR
20.0000 meq | EXTENDED_RELEASE_TABLET | Freq: Two times a day (BID) | ORAL | Status: DC
  Administered 2023-11-11 – 2023-11-16 (×8): 20 meq via ORAL
  Filled 2023-11-10 (×12): qty 1

## 2023-11-10 MED ORDER — IOHEXOL 300 MG/ML  SOLN
75.0000 mL | Freq: Once | INTRAMUSCULAR | Status: AC | PRN
  Administered 2023-11-10: 60 mL via INTRAVENOUS

## 2023-11-10 MED ORDER — VITAMIN B-12 1000 MCG PO TABS
1000.0000 ug | ORAL_TABLET | Freq: Every day | ORAL | Status: DC
  Administered 2023-11-10 – 2023-11-17 (×8): 1000 ug via ORAL
  Filled 2023-11-10: qty 2
  Filled 2023-11-10 (×6): qty 1
  Filled 2023-11-10: qty 2

## 2023-11-10 MED ORDER — POLYETHYLENE GLYCOL 3350 17 G PO PACK
17.0000 g | PACK | Freq: Two times a day (BID) | ORAL | Status: DC
  Administered 2023-11-12 – 2023-11-16 (×4): 17 g via ORAL
  Filled 2023-11-10 (×11): qty 1

## 2023-11-10 MED ORDER — LINACLOTIDE 145 MCG PO CAPS
290.0000 ug | ORAL_CAPSULE | Freq: Every day | ORAL | Status: DC
Start: 2023-11-10 — End: 2023-11-17
  Administered 2023-11-10 – 2023-11-16 (×6): 290 ug via ORAL
  Filled 2023-11-10 (×8): qty 2
  Filled 2023-11-10: qty 1

## 2023-11-10 MED ORDER — BISACODYL 10 MG RE SUPP: 10.0000 mg | Freq: Every day | RECTAL | Status: DC | PRN

## 2023-11-10 MED ORDER — METOCLOPRAMIDE HCL 5 MG PO TABS
5.0000 mg | ORAL_TABLET | Freq: Four times a day (QID) | ORAL | Status: DC
  Administered 2023-11-15 (×3): 5 mg via ORAL
  Filled 2023-11-10 (×28): qty 1

## 2023-11-10 MED ORDER — MAGNESIUM OXIDE -MG SUPPLEMENT 400 (240 MG) MG PO TABS
400.0000 mg | ORAL_TABLET | Freq: Three times a day (TID) | ORAL | Status: DC
  Administered 2023-11-10 – 2023-11-15 (×4): 400 mg via ORAL
  Filled 2023-11-10 (×10): qty 1

## 2023-11-10 MED ORDER — METHYLPHENIDATE HCL 10 MG PO TABS
20.0000 mg | ORAL_TABLET | Freq: Three times a day (TID) | ORAL | Status: DC
  Administered 2023-11-10 – 2023-11-17 (×21): 20 mg via ORAL
  Filled 2023-11-10 (×10): qty 2
  Filled 2023-11-10: qty 1
  Filled 2023-11-10 (×10): qty 2

## 2023-11-10 MED ORDER — ACETAMINOPHEN 325 MG PO TABS
650.0000 mg | ORAL_TABLET | Freq: Four times a day (QID) | ORAL | Status: DC | PRN
  Administered 2023-11-12 – 2023-11-13 (×2): 650 mg via ORAL
  Filled 2023-11-10 (×2): qty 2

## 2023-11-10 MED ORDER — MIDODRINE HCL 5 MG PO TABS
10.0000 mg | ORAL_TABLET | Freq: Three times a day (TID) | ORAL | Status: DC
  Administered 2023-11-10 – 2023-11-17 (×21): 10 mg via ORAL
  Filled 2023-11-10 (×21): qty 2

## 2023-11-10 MED ORDER — APIXABAN 5 MG PO TABS
5.0000 mg | ORAL_TABLET | Freq: Two times a day (BID) | ORAL | Status: DC
  Administered 2023-11-10 – 2023-11-17 (×14): 5 mg via ORAL
  Filled 2023-11-10 (×14): qty 1

## 2023-11-10 MED ORDER — ACETAMINOPHEN 650 MG RE SUPP: 650.0000 mg | Freq: Four times a day (QID) | RECTAL | Status: DC | PRN

## 2023-11-10 MED ORDER — FAMOTIDINE 20 MG PO TABS
40.0000 mg | ORAL_TABLET | Freq: Every evening | ORAL | Status: DC | PRN
  Administered 2023-11-11: 40 mg via ORAL
  Filled 2023-11-10: qty 2

## 2023-11-10 MED ORDER — SUCRALFATE 1 G PO TABS
1.0000 g | ORAL_TABLET | Freq: Four times a day (QID) | ORAL | Status: DC
  Administered 2023-11-10 – 2023-11-17 (×27): 1 g via ORAL
  Filled 2023-11-10 (×27): qty 1

## 2023-11-10 MED ORDER — ONDANSETRON HCL 4 MG PO TABS: 4.0000 mg | ORAL_TABLET | Freq: Four times a day (QID) | ORAL | Status: DC | PRN

## 2023-11-10 MED ORDER — POTASSIUM CHLORIDE CRYS ER 20 MEQ PO TBCR
40.0000 meq | EXTENDED_RELEASE_TABLET | Freq: Once | ORAL | Status: AC
  Administered 2023-11-10: 40 meq via ORAL
  Filled 2023-11-10: qty 2

## 2023-11-10 MED ORDER — BISACODYL 5 MG PO TBEC
10.0000 mg | DELAYED_RELEASE_TABLET | Freq: Two times a day (BID) | ORAL | Status: DC
  Administered 2023-11-12 – 2023-11-15 (×2): 10 mg via ORAL
  Filled 2023-11-10 (×11): qty 2

## 2023-11-10 MED ORDER — SODIUM CHLORIDE 0.9% FLUSH
3.0000 mL | Freq: Two times a day (BID) | INTRAVENOUS | Status: DC
  Administered 2023-11-11 – 2023-11-17 (×13): 3 mL via INTRAVENOUS

## 2023-11-10 MED ORDER — PREGABALIN 75 MG PO CAPS
150.0000 mg | ORAL_CAPSULE | Freq: Two times a day (BID) | ORAL | Status: DC
  Administered 2023-11-10 – 2023-11-17 (×9): 150 mg via ORAL
  Filled 2023-11-10 (×3): qty 2
  Filled 2023-11-10: qty 3
  Filled 2023-11-10 (×6): qty 2
  Filled 2023-11-10: qty 3

## 2023-11-10 MED ORDER — OXYCODONE HCL 5 MG PO TABS
5.0000 mg | ORAL_TABLET | Freq: Once | ORAL | Status: DC
  Filled 2023-11-10: qty 1

## 2023-11-10 NOTE — Assessment & Plan Note (Signed)
Patient has a history of cerebral palsy with spastic quadriparesis.  She gets around her home with a wheelchair, but notes that she does crawl around occasionally as well.  - Continue home regimen

## 2023-11-10 NOTE — ED Notes (Signed)
Patient transported to CT 

## 2023-11-10 NOTE — Assessment & Plan Note (Signed)
History of frequent palpitations, previously on metoprolol however discontinued due to hypotension.  Patient states palpitations have been more bothersome lately.  - Telemetry monitoring

## 2023-11-10 NOTE — Assessment & Plan Note (Signed)
History of severe constipation with moderate stool burden seen on CT imaging.  - Continue home MiraLAX and Dulcolax twice daily - Dulcolax suppository as needed

## 2023-11-10 NOTE — ED Notes (Signed)
Informed RN bed assigned 

## 2023-11-10 NOTE — ED Notes (Addendum)
Dr. Fuller Plan notified of pt and her fall and on blood thinners. No new orders given to this RN. Primary RN Misty Stanley also notified of patients fall and on blood thinners.

## 2023-11-10 NOTE — Assessment & Plan Note (Signed)
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  - Continue home midodrine

## 2023-11-10 NOTE — Assessment & Plan Note (Signed)
-   Continue home Eliquis

## 2023-11-10 NOTE — Assessment & Plan Note (Signed)
Chronic and stable.  No evidence of bleeding on examination.

## 2023-11-10 NOTE — Assessment & Plan Note (Signed)
Patient states that she fell out of her wheelchair when she had a lower back spasm.  She notes that spasms are infrequent for her.  Full pan scan negative for acute abnormalities.  - Continue home tramadol as needed - Management of electrolytes as noted above

## 2023-11-10 NOTE — ED Notes (Signed)
RN attempted to get blood, unable to. Lab called

## 2023-11-10 NOTE — H&P (Signed)
History and Physical    Patient: [name]:[id number] DOB: [birth year] DOA: 11/10/2023 DOS: the patient was seen and examined on 11/10/2023 PCP: Dione Housekeeper, MD  Patient coming from: Home  Chief Complaint:  Chief Complaint  Patient presents with   Fall   HPI: [name] is a 42 y.o. 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  [name] states that she has been getting around her home with her wheelchair but also crawls around at times.  She had 2 falls recently and did hit her head 1 time, but denies any loss of consciousness.  She states the most recent fall was due to a lower back spasm.  When she is unable to help herself up, her mother is also not able to do so.  She endorses left shoulder pain and lower back pain.  She also endorses worsening palpitations over the last few weeks but denies any chest pain at this time.  She endorses constipation.  ED course: On arrival to the ED, patient was hypertensive at 130/110 with heart rate of 85.  She was saturating at 100% on room air.  She was afebrile at 97.4. Initial workup notable for hemoglobin of 10.9, platelets of 130, potassium 2.6, magnesium 1.2, and GFR above 60.  Troponin and CK within normal limits.  CT head, chest, abdomen, spine all with no acute findings.  Chest x-ray with no acute findings.  Knee and shoulder x-rays without acute findings.  Patient started on potassium tablets and IV magnesium.  TRH contacted for admission.  Review of Systems: As mentioned in the history of present illness. All other systems reviewed and are negative.  Past Medical History:  Diagnosis Date   Carpal tunnel syndrome    Cerebral palsy (HCC)    Chronic back pain    Gitelman syndrome     Hypokalemia    now resolved   Magnesium deficiency    Pacemaker    PONV (postoperative nausea and vomiting)    Postural orthostatic tachycardia syndrome    s/p pacemaker placement.   RSD (reflex sympathetic dystrophy)    right foot,    RSD (reflex sympathetic dystrophy)    SA node dysfunction (HCC) 2008   Past Surgical History:  Procedure Laterality Date   ABDOMINAL HYSTERECTOMY Left 2013   Dr. Tiburcio Pea   COLONOSCOPY WITH PROPOFOL N/A 12/11/2022   Procedure: COLONOSCOPY WITH PROPOFOL;  Surgeon: Toney Reil, MD;  Location: [facility] ENDOSCOPY;  Service: Gastroenterology;  Laterality: N/A;   COLONOSCOPY WITH PROPOFOL N/A 12/13/2022   Procedure: COLONOSCOPY WITH PROPOFOL;  Surgeon: Toney Reil, MD;  Location: [facility] ENDOSCOPY;  Service: Gastroenterology;  Laterality: N/A;   INSERT / REPLACE / REMOVE PACEMAKER     ORTHOPEDIC SURGERY     PACEMAKER INSERTION     PORTACATH PLACEMENT     Social History:  reports that she has never smoked. She has never used smokeless tobacco. She reports that she does not drink alcohol and does not use drugs.  Allergies  Allergen Reactions   Duloxetine     Other reaction(s): Other (See Comments), Palpitations Other reaction(s): Hallucinations, Hyperactive behavior (finding), Other (See Comments) Stressed out     Flecainide     Other reaction(s): Other (See Comments) Other reaction(s): Hypomagnesemia (disorder), Other (See Comments)  Other Reaction: QUESTION HYPOMAGNESEMIA     Fluoxetine     Other reaction(s): Nausea And Vomiting, Other (See Comments) Other reaction(s): Other (See Comments) Other Reaction: INCREASED HR  causes irritability and irrational behavior "I go crazy"   Gabapentin     Goes crazy Other reaction(s): Other (See Comments) Other reaction(s): Hyperactive behavior (finding), Mental Status Changes (intolerance), Other (See Comments) Goes crazy Hallucinations   Baclofen     Other reaction(s): Other (See Comments),  Other (See Comments) Weakness  Other reaction(s): Other (See Comments) Weakness and loose Weakness  Other reaction(s):  Weakness     Cephalosporins     Other reaction(s): Hives, Rash Received pre-op vancomycin and cefazolin 07/20/16. Developed urticarial rash on bilateral lower extremities + slight periorbital swelling. Treated with diphenhydramine. Received pre-op vancomycin and cefazolin 07/20/16. Developed urticarial rash on bilateral lower extremities + slight periorbital swelling. Treated with diphenhydramine. Received pre-op vancomycin and cefazolin 07/20/16. Developed urticarial rash on bilateral lower extremities + slight periorbital swelling. Treated with diphenhydramine.   Sulfasalazine     Other reaction(s): Nausea And Vomiting   Sulfa Antibiotics Nausea And Vomiting   Vancomycin     Other reaction(s): Hives Received pre-op vancomycin and cefazolin 07/20/16. Developed urticarial rash on bilateral lower extremities + slight periorbital swelling. Treated with diphenhydramine. Received pre-op vancomycin and cefazolin 07/20/16. Developed urticarial rash on bilateral lower extremities + slight periorbital swelling. Treated with diphenhydramine. Received pre-op vancomycin and cefazolin 07/20/16. Developed urticarial rash on bilateral lower extremities + slight periorbital swelling. Treated with diphenhydramine.   Cefazolin Rash    Tolerated ceftriaxone 2023 and cephalexin 2022    Family History  Problem Relation Age of Onset   Diabetes Mother    Hypertension Mother     Prior to Admission medications   Medication Sig Start Date End Date Taking? Authorizing Provider  acetaminophen (TYLENOL) 325 MG tablet Take 2 tablets (650 mg total) by mouth every 6 (six) hours as needed for mild pain (pain score 1-3) or fever. 09/28/23   Gillis Santa, MD  bisacodyl (DULCOLAX) 10 MG suppository Place 1 suppository (10 mg total) rectally daily as needed for severe constipation. 09/28/23 09/27/24  Gillis Santa, MD  bisacodyl (DULCOLAX) 5 MG EC tablet Take 2 tablets (10 mg total) by mouth 2 (two) times daily. 09/28/23 09/27/24  Gillis Santa, MD  Cholecalciferol (D2000 ULTRA STRENGTH) 50 MCG (2000 UT) CAPS Take 1 tablet by mouth daily.    [provider]  cyanocobalamin (VITAMIN B12) 1000 MCG tablet Take 1,000 mcg by mouth daily.    [provider]  ELIQUIS 5 MG TABS tablet Take 5 mg by mouth 2 (two) times daily. 08/12/22   [provider]  famotidine (PEPCID) 40 MG tablet Take 1 tablet (40 mg total) by mouth at bedtime as needed for heartburn. 09/28/23   Gillis Santa, MD  heparin lock flush 100 UNIT/ML SOLN injection Inject 5 mL (500 Units total) as directed as needed for line care. Flush using SASH (saline, administer  IV, saline, heparin) method as directed. Flush IV catheter with heparin after the last saline flush.  Use syringe ONE TIME only then discard. Storage: Room Temperature. 08/30/23 08/30/24  [provider]  LINZESS 290 MCG CAPS capsule Take 290 mcg by mouth daily. 10/26/22   [provider]  magnesium (MAGTAB) 84 MG ( ) TBCR SR tablet Take 936 mg by mouth 3 (three) times daily. 11/30/22   [provider]  magnesium chloride 200 MG/ML SOLN Inject 20 mLs (4,000 mg  total) as directed 2 (two) times a week. 4gm MgSulfate on 1000 ml LR per home infusion 09/29/23 09/28/24  Gillis Santa, MD  methocarbamol (ROBAXIN) 500 MG tablet Take 500-1,000 mg by mouth every 6 (six) hours as needed (for back spasms).    [provider]  methylphenidate (RITALIN) 20 MG tablet Take 20 mg by mouth 3 (three) times daily. Take 1 tablet (20 mg) TID at 0800, 1200 & 2100-2200    [provider]  midodrine (PROAMATINE) 10 MG tablet Take 1 tablet (10 mg total) by mouth 3 (three) times daily with meals. Hold if Systolic BP >110 87/56/43 03/26/24  Gillis Santa, MD  montelukast (SINGULAIR) 10 MG tablet Take 10 mg by mouth daily as needed  (allergies). 10/26/22   [provider]  pantoprazole (PROTONIX) 40 MG tablet Take 40 mg by mouth daily. 08/06/21 02/15/24  [provider]  polyethylene glycol (MIRALAX / GLYCOLAX) 17 g packet Take 17 g by mouth 2 (two) times daily. 09/28/23 09/27/24  Gillis Santa, MD  pregabalin (LYRICA) 150 MG capsule Take 150 mg by mouth in the morning, at noon, and at bedtime.    [provider]  PREMARIN vaginal cream Place 0.5 g vaginally 2 (two) times a week. 11/08/22   [provider]  sucralfate (CARAFATE) 1 g tablet Take 1 g by mouth 4 (four) times daily.    [provider]  traMADol (ULTRAM) 50 MG tablet Take 50 mg by mouth every 6 (six) hours as needed for moderate pain. 09/20/13   [provider]  Vitamin D, Ergocalciferol, (DRISDOL) 1.25 MG (50000 UNIT) CAPS capsule Take 1 capsule (50,000 Units total) by mouth every 7 (seven) days. 09/29/23 12/28/23  Gillis Santa, MD    Physical Exam: Vitals:   11/10/23 1049 11/10/23 1052  BP:  (!) 130/110  Pulse:  85  Resp:  18  Temp:  (!) 97.4 F (36.3 C)  SpO2:  100%  Weight: 42 kg   Height: 5\' 2"  (1.575 m)    Physical Exam Vitals and nursing note reviewed.  Constitutional:      General: She is not in acute distress.    Appearance: She is underweight.  HENT:     Head: Normocephalic and atraumatic.     Mouth/Throat:     Mouth: Mucous membranes are moist.     Pharynx: Oropharynx is clear.  Eyes:     Conjunctiva/sclera: Conjunctivae normal.     Pupils: Pupils are equal, round, and reactive to light.  Cardiovascular:     Rate and Rhythm: Normal rate and regular rhythm.     Heart sounds: No murmur heard. Pulmonary:     Effort: Pulmonary effort is normal. No respiratory distress.     Breath sounds: Normal breath sounds.  Abdominal:     Palpations: Abdomen is soft.  Musculoskeletal:     Comments: No shoulder deformity or swelling noted. Bilateral upper extremity contractures noted.  Skin:     General: Skin is warm and dry.  Neurological:     Mental Status: She is alert and oriented to person, place, and time. Mental status is at baseline.    Data Reviewed: CBC with WBC of 4.6, hemoglobin of 10.9, P78.7, platelets of 130 CMP with sodium of 148, potassium 2.6, chloride 102, bicarb 26, glucose 101, BUN 19, creatinine 0.76, AST 22, ALT 20, GFR above 60 Magnesium 1.2 CK1 90 Troponin 3  EKG personally reviewed.  Atrial paced rhythm.  Unchanged compared to prior.  CT CHEST  ABDOMEN PELVIS W CONTRAST  Result Date: 11/10/2023 CLINICAL DATA:  Polytrauma, blunt EXAM: CT CHEST, ABDOMEN, AND PELVIS WITH CONTRAST TECHNIQUE: Multidetector CT imaging of the chest, abdomen and pelvis was performed following the standard protocol during bolus administration of intravenous contrast. RADIATION DOSE REDUCTION: This exam was performed according to the departmental dose-optimization program which includes automated exposure control, adjustment of the mA and/or kV according to patient size and/or use of iterative reconstruction technique. CONTRAST:  60mL OMNIPAQUE IOHEXOL 300 MG/ML  SOLN COMPARISON:  09/22/2023, 04/30/2023 FINDINGS: CT CHEST FINDINGS Cardiovascular: Right IJ chest port hand left subclavian approach pacemaker remain in place. Epicardial leads are also present. Heart size is normal. No pericardial effusion. Thoracic aorta is normal in course and caliber. Central pulmonary vasculature is within normal limits. Mediastinum/Nodes: No enlarged mediastinal, hilar, or axillary lymph nodes. Thyroid gland, trachea, and esophagus demonstrate no significant findings. Lungs/Pleura: Unchanged pleuroparenchymal scarring at the periphery of the left upper lobe. Lungs are otherwise clear. No pleural effusion or pneumothorax. Musculoskeletal: No acute osseous abnormality. No chest wall hematoma. CT ABDOMEN PELVIS FINDINGS Hepatobiliary: No focal liver abnormality is seen. Status post cholecystectomy. No biliary  dilatation. Pancreas: No acute findings. Pancreatic body and tail are not well seen. Spleen: Normal in size without focal abnormality. Adrenals/Urinary Tract: Adrenal glands are unremarkable. Kidneys are normal, without renal calculi, solid lesion, or hydronephrosis. Bladder is unremarkable. Stomach/Bowel: Stomach is within normal limits. Moderate-large volume of stool throughout the colon. No evidence of bowel wall thickening, distention, or inflammatory changes. Vascular/Lymphatic: No significant vascular findings are present. No enlarged abdominal or pelvic lymph nodes. Reproductive: Status post hysterectomy. No adnexal masses. Other: No free fluid. No abdominopelvic fluid collection. No pneumoperitoneum. No abdominal wall hernia. Musculoskeletal: No acute or significant osseous findings. IMPRESSION: 1. No acute findings within the chest, abdomen, or pelvis. 2. Moderate-large volume of stool throughout the colon. Electronically Signed   By: Duanne Guess D.O.   On: 11/10/2023 13:44   CT T-SPINE NO CHARGE  Result Date: 11/10/2023 CLINICAL DATA:  Mechanical fall yesterday.  Pain. EXAM: CT THORACIC SPINE WITHOUT CONTRAST TECHNIQUE: Multidetector CT images of the thoracic were obtained using the standard protocol without intravenous contrast. RADIATION DOSE REDUCTION: This exam was performed according to the departmental dose-optimization program which includes automated exposure control, adjustment of the mA and/or kV according to patient size and/or use of iterative reconstruction technique. COMPARISON:  CT angio chest 04/30/2023. Thoracic spine radiographs 07/30/2014. FINDINGS: Alignment: No significant listhesis is present. Straightening of the upper thoracic kyphosis is stable. Exaggerated lower thoracic kyphosis is also stable. Vertebrae: Vertebral body heights are normal. No acute fractures are present. Mild endplate degenerative changes are noted in the lower thoracic spine. No focal osseous lesions  are present. Paraspinal and other soft tissues: The paraspinous soft tissues are within normal limits. The visualized lung fields are clear. A right IJ line is in satisfactory position. Defibrillator in pacing wires enter via a left subclavian approach. Disc levels: A left paramedian calcified disc protrusion is present at T6-7 without significant stenosis. Mild calcified disc is also present at T2-3 without significant stenosis. The foramina are patent bilaterally. IMPRESSION: 1. No acute fracture or traumatic subluxation. 2. Mild degenerative changes of the thoracic spine as described. Electronically Signed   By: Marin Roberts [name]   On: 11/10/2023 13:41   CT L-SPINE NO CHARGE  Result Date: 11/10/2023 CLINICAL DATA:  Mechanical fall yesterday.  Pain. EXAM: CT LUMBAR SPINE WITHOUT CONTRAST TECHNIQUE: Multidetector CT imaging of  the lumbar spine was performed without intravenous contrast administration. Multiplanar CT image reconstructions were also generated. RADIATION DOSE REDUCTION: This exam was performed according to the departmental dose-optimization program which includes automated exposure control, adjustment of the mA and/or kV according to patient size and/or use of iterative reconstruction technique. COMPARISON:  CT of the abdomen and pelvis 09/22/2023 FINDINGS: Segmentation: 5 non rib-bearing lumbar type vertebral bodies are present. The lowest fully formed vertebral body is L5. Alignment: Slight degenerative retrolisthesis at L1-2, L2-3 and L3-4 is stable. Lumbar lordosis is preserved. Vertebrae: Endplate degenerative changes are present at T12-L1 and L1-2. Vertebral body heights are maintained. No acute fractures are present. Paraspinal and other soft tissues: Atherosclerotic calcifications are present aorta and branch vessels. No aneurysm is present. Paraspinous soft tissues are otherwise within normal limits. Disc levels: T12-L1: A left paramedian calcified disc protrusion is present  without significant stenosis. L1-2: A broad-based calcified disc protrusion is present. No focal stenosis is present. L2-3: Negative. L3-4: A broad-based disc protrusion is present. No focal stenosis is present. L4-5: A broad-based disc protrusion is present. Mild facet hypertrophy and ligamentum flavum thickening is noted. Moderate subarticular stenosis is worse right than left. Mild to moderate foraminal stenosis is present bilaterally. L5-S1: Moderate facet hypertrophy is worse on the right. No focal disc protrusion or stenosis is present. IMPRESSION: 1. No acute fracture or traumatic subluxation. 2. Multilevel degenerative changes of the lumbar spine as described. 3. Moderate subarticular stenosis at L4-5 is worse right than left. 4. Mild to moderate foraminal stenosis bilaterally at L4-5. 5. Moderate facet hypertrophy at L5-S1 is worse on the right. 6.  Aortic Atherosclerosis (ICD10-I70.0). Electronically Signed   By: Marin Roberts [name]   On: 11/10/2023 13:36   CT Cervical Spine Wo Contrast  Result Date: 11/10/2023 CLINICAL DATA:  Mechanical fall yesterday.  Ataxia. EXAM: CT CERVICAL SPINE WITHOUT CONTRAST TECHNIQUE: Multidetector CT imaging of the cervical spine was performed without intravenous contrast. Multiplanar CT image reconstructions were also generated. RADIATION DOSE REDUCTION: This exam was performed according to the departmental dose-optimization program which includes automated exposure control, adjustment of the mA and/or kV according to patient size and/or use of iterative reconstruction technique. COMPARISON:  None Available. FINDINGS: Alignment: Slight retrolisthesis present at C5-6. Slight degenerative retrolisthesis is present at C5-6. Alignment is otherwise anatomic. Slight reversal of the normal cervical lordosis is present. Skull base and vertebrae: A remote type 1 dens fracture is well corticated. No acute fractures are present. No focal osseous lesions are present. Soft  tissues and spinal canal: No prevertebral fluid or swelling. No visible canal hematoma. The soft tissues the neck are otherwise unremarkable. Disc levels: Uncovertebral spurring at a rightward disc osteophyte complex are present at C3-4 with partial effacement of ventral CSF and moderate right foraminal stenosis. Uncovertebral spurring contributes to mild foraminal narrowing, left greater than right at C5-6. A broad-based disc osteophyte complex partially effaces the ventral CSF and results in moderate foraminal stenosis bilaterally at C6-7. Upper chest: The lung apices are clear. The thoracic inlet is within normal limits. A right IJ catheter is in place. IMPRESSION: 1. No acute fracture or traumatic subluxation. 2. Remote type 1 dens fracture. 3. Multilevel degenerative changes of the cervical spine as described. Electronically Signed   By: Marin Roberts [name]   On: 11/10/2023 13:32   CT HEAD WO CONTRAST ( )  Result Date: 11/10/2023 CLINICAL DATA:  Mechanical fall yesterday. EXAM: CT HEAD WITHOUT CONTRAST TECHNIQUE: Contiguous axial images were obtained from the  base of the skull through the vertex without intravenous contrast. RADIATION DOSE REDUCTION: This exam was performed according to the departmental dose-optimization program which includes automated exposure control, adjustment of the mA and/or kV according to patient size and/or use of iterative reconstruction technique. COMPARISON:  CT head without contrast 05/29/2023 FINDINGS: Brain: No acute infarct, hemorrhage, or mass lesion is present. 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 Basal ganglia are otherwise within normal limits. No significant extraaxial fluid collection is present. The ventricles are of normal size. The brainstem and cerebellum are within normal limits. Midline structures are within normal limits. Vascular: No hyperdense vessel or unexpected calcification. Skull: Calvarium is  intact. No focal lytic or blastic lesions are present. No significant extracranial soft tissue lesion is present. Sinuses/Orbits: The paranasal sinuses and mastoid air cells are clear. The globes and orbits are within normal limits. IMPRESSION: 1. No acute intracranial abnormality or significant interval change. 2. 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 Electronically Signed   By: Marin Roberts [name]   On: 11/10/2023 13:28   DG Knee 2 Views Left  Result Date: 11/10/2023 CLINICAL DATA:  Fall, pain EXAM: LEFT KNEE - 1-2 VIEW COMPARISON:  05/29/2023 FINDINGS: No evidence of fracture, dislocation, or joint effusion. Mild medial compartment joint space narrowing. Chondrocalcinosis. Prominent soft tissue swelling anterior to the tibial tuberosity and within the prepatellar region. IMPRESSION: Prominent soft tissue swelling anterior to the tibial tuberosity and within the prepatellar region. No acute fracture or dislocation. Electronically Signed   By: Duanne Guess D.O.   On: 11/10/2023 12:31   DG Chest 2 View  Result Date: 11/10/2023 CLINICAL DATA:  Fall EXAM: CHEST - 2 VIEW COMPARISON:  10/16/2023 FINDINGS: Right IJ chest port remains in place. Left subclavian approach pacemaker and epicardial pacemaker are unchanged in positioning. Normal heart size. No focal airspace consolidation, pleural effusion, or pneumothorax. IMPRESSION: No active cardiopulmonary disease. Electronically Signed   By: Duanne Guess D.O.   On: 11/10/2023 11:42   DG Shoulder Left  Result Date: 11/10/2023 CLINICAL DATA:  Fall, left shoulder pain EXAM: LEFT SHOULDER - 2+ VIEW COMPARISON:  None Available. FINDINGS: There is no evidence of fracture or dislocation. Minimal degenerative changes of the acromioclavicular and glenohumeral joints. Soft tissues are unremarkable. IMPRESSION: No acute fracture or dislocation of the left shoulder. Electronically Signed   By: Duanne Guess D.O.   On: 11/10/2023 11:41    There are no new results to review at this time.  Assessment and Plan:  * Electrolyte disturbance Patient has a history of chronic hypomagnesemia and hypokalemia in the setting of Gitleman's disease.  She follows with [facility].  Per chart review, she seems to have had a lot of difficulty with receiving her magnesium infusions; telephone notes indicate both patient noncompliance and insurance difficulties.  - Telemetry monitoring x 24 hours  - S/p magnesium 2 g IV - S/p 20 mEq IV and 40 mill equivalents p.o. potassium - BMP and magnesium twice daily - Per last nephrology office visit, restart magnesium 400 mg TID and potassium 20 mEq BID - TOC consultation to assist with arranging home infusions  Ground-level fall Patient states that she fell out of her wheelchair when she had a lower back spasm.  She notes that spasms are infrequent for her.  Full pan scan negative for acute abnormalities.  - Continue home tramadol as needed - Management of electrolytes as noted above  Hypotension History  of chronic hypotension, likely due to protein malnutrition and potential dysautonomia in the setting of cerebral palsy.  - Continue home midodrine  Cerebral palsy, unspecified (HCC) Patient has a history of cerebral palsy with spastic quadriparesis.  She gets around her home with a wheelchair, but notes that she does crawl around occasionally as well.  - Continue home regimen  DVT (deep venous thrombosis) (HCC) - Continue home Eliquis  Thrombocytopenia (HCC) Chronic and stable.  No evidence of bleeding on examination.  Palpitation History of frequent palpitations, previously on metoprolol however discontinued due to hypotension.  Patient states palpitations have been more bothersome lately.  - Telemetry monitoring  Constipation History of severe constipation with moderate stool burden seen on CT imaging.  - Continue home MiraLAX and Dulcolax  twice daily - Dulcolax suppository as needed  Advance Care Planning:   Code Status: Full Code   Consults: None  Family Communication: No family at bedside  Severity of Illness: The appropriate patient status for this patient is OBSERVATION. Observation status is judged to be reasonable and necessary in order to provide the required intensity of service to ensure the patient's safety. The patient's presenting symptoms, physical exam findings, and initial radiographic and laboratory data in the context of their medical condition is felt to place them at decreased risk for further clinical deterioration. Furthermore, it is anticipated that the patient will be medically stable for discharge from the hospital within 2 midnights of admission.   Author: Verdene Lennert, MD 11/10/2023 2:50 PM  For on call review [email or web address].

## 2023-11-10 NOTE — ED Notes (Signed)
Patient to xray.

## 2023-11-10 NOTE — ED Notes (Signed)
Fuller Plan, MD, made aware of potassium 2.6

## 2023-11-10 NOTE — Assessment & Plan Note (Addendum)
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  She follows with [facility].  Per chart review, she seems to have had a lot of difficulty with receiving her magnesium infusions; telephone notes indicate both patient noncompliance and insurance difficulties.  - Telemetry monitoring x 24 hours  - S/p magnesium 2 g IV - S/p 20 mEq IV and 40 mill equivalents p.o. potassium - BMP and magnesium twice daily - Per last nephrology office visit, restart magnesium 400 mg TID and potassium 20 mEq BID - TOC consultation to assist with arranging home infusions

## 2023-11-10 NOTE — ED Triage Notes (Addendum)
Pt BIB [facility]. Pt reports falling yesterday and struck her pacemaker site, with on trauma. Pt also states chronic constipation. EMS reports pt may have a low magnesium with a potassium 2.9 as per pt Pt reports hitting her head, without loss of consciousness and is on a blood thinner.  Pt is wheelchair bound but was crawling to the bathroom when her back went out.    EMS vitals  135/80 BP 112cbg

## 2023-11-10 NOTE — ED Provider Notes (Signed)
[facility] Provider Note    Event Date/Time   First MD Initiated Contact with Patient 11/10/23 1104     (approximate)   History   Fall   HPI  [name] is a 42 y.o. female with cerebral palsy, recurrent hypokalemia and hypomagnesia who comes in with concerns for fall.  Patient reports that at home she crawls around and that her back gave out and that she fell onto the ground did hit her head.  She states it was difficult for her to get up.  She reports that she has been told she has a low magnesium level but she is having difficulties getting the transfusions done at home.  She does not sound like she blacked out.  She does think she hit her head.  She reports some pain in her left knee as well as some pain over her ICD.  Physical Exam   Triage Vital Signs: ED Triage Vitals  Encounter Vitals Group     BP 11/10/23 1052 (!) 130/110     Systolic BP Percentile --      Diastolic BP Percentile --      Pulse Rate 11/10/23 1052 85     Resp 11/10/23 1052 18     Temp 11/10/23 1052 (!) 97.4 F (36.3 C)     Temp src --      SpO2 11/10/23 1052 100 %     Weight 11/10/23 1049 92 lb 9.5 oz (42 kg)     Height 11/10/23 1049 5\' 2"  (1.575 m)     Head Circumference --      Peak [name] --      Pain Score 11/10/23 1048 7     Pain Loc --      Pain Education --      Exclude from Growth Chart --     Most recent vital signs: Vitals:   11/10/23 1052  BP: (!) 130/110  Pulse: 85  Resp: 18  Temp: (!) 97.4 F (36.3 C)  SpO2: 100%     General: Awake, no distress.  CV:  Good peripheral perfusion.  Resp:  Normal effort.  Abd:  No distention.  Other:  Contractures noted with some tenderness on her left knee left chest wall tenderness, back tenderness.   ED Results / Procedures / Treatments   Labs (all labs ordered are listed, but only abnormal results are displayed) Labs Reviewed  CBC  COMPREHENSIVE METABOLIC PANEL  CK  MAGNESIUM  TROPONIN I (HIGH  SENSITIVITY)     EKG  My interpretation of EKG:  Atrial paced rate of 85 without any ST elevation or T wave versions, normal intervals  RADIOLOGY I have reviewed the xray personally and interpreted and no pneumonia, ICD in place  PROCEDURES:  Critical Care performed: No  Procedures   MEDICATIONS ORDERED IN ED: Medications  magnesium sulfate IVPB 2 g 50 mL (has no administration in time range)  acetaminophen (TYLENOL) tablet 1,000 mg (has no administration in time range)     IMPRESSION / MDM / ASSESSMENT AND PLAN / ED COURSE  I reviewed the triage vital signs and the nursing notes.   Patient's presentation is most consistent with acute presentation with potential threat to life or bodily function.   She comes in with a fall on Eliquis.  Given her stutter somewhat difficult to understand the situation but sounds like it was mechanical in nature but labs, EKG, device interrogation was done.  CT pan scan ordered evaluate  for any fractures, intracranial hemorrhage, cervical fracture.  Tylenol was given for pain.  CT head scans are reassuring however patient has low magnesium and low potassium.  Patient states that she has not been getting much help from her mom and that she crawls around on the ground to get around that she fell yesterday and now had another fall.  I am concerned about patient's safety with going home given her electrolyte abnormalities and difficulty getting outpatient treatment of these I will discuss the hospital team for admission.  The rest of her labs are reassuring with reassuring CBC.  The patient is on the cardiac monitor to evaluate for evidence of arrhythmia and/or significant heart rate changes.      FINAL CLINICAL IMPRESSION(S) / ED DIAGNOSES   Final diagnoses:  Hypokalemia  Hypomagnesemia  Fall, initial encounter     Rx / DC Orders   ED Discharge Orders     None        Note:  This document was prepared using Dragon voice  recognition software and may include unintentional dictation errors.   Concha Se, MD 11/10/23 1409

## 2023-11-11 ENCOUNTER — Encounter: Payer: Self-pay | Admitting: Internal Medicine

## 2023-11-11 DIAGNOSIS — E878 Other disorders of electrolyte and fluid balance, not elsewhere classified: Secondary | ICD-10-CM | POA: Diagnosis not present

## 2023-11-11 LAB — BASIC METABOLIC PANEL
Anion gap: 9 (ref 5–15)
BUN: 14 mg/dL (ref 6–20)
CO2: 24 mmol/L (ref 22–32)
Calcium: 8.9 mg/dL (ref 8.9–10.3)
Chloride: 105 mmol/L (ref 98–111)
Creatinine, Ser: 0.73 mg/dL (ref 0.44–1.00)
GFR, Estimated: >60 mL/min (ref >60)
Glucose, Bld: 107 mg/dL — ABNORMAL HIGH (ref 70–99)
Potassium: 3.5 mmol/L (ref 3.5–5.1)
Sodium: 138 mmol/L (ref 135–145)

## 2023-11-11 LAB — CBC WITH DIFFERENTIAL/PLATELET
Abs Immature Granulocytes: 0.02 10*3/uL (ref 0.00–0.07)
Basophils Absolute: 0 10*3/uL (ref 0.0–0.1)
Basophils Relative: 1 %
Eosinophils Absolute: 0 10*3/uL (ref 0.0–0.5)
Eosinophils Relative: 1 %
HCT: 31.9 % — ABNORMAL LOW (ref 36.0–46.0)
Hemoglobin: 10.6 g/dL — ABNORMAL LOW (ref 12.0–15.0)
Immature Granulocytes: 0 %
Lymphocytes Relative: 39 %
Lymphs Abs: 1.7 10*3/uL (ref 0.7–4.0)
MCH: 25.7 pg — ABNORMAL LOW (ref 26.0–34.0)
MCHC: 33.2 g/dL (ref 30.0–36.0)
MCV: 77.4 fL — ABNORMAL LOW (ref 80.0–100.0)
Monocytes Absolute: 0.4 10*3/uL (ref 0.1–1.0)
Monocytes Relative: 9 %
Neutro Abs: 2.3 10*3/uL (ref 1.7–7.7)
Neutrophils Relative %: 50 %
Platelets: 149 10*3/uL — ABNORMAL LOW (ref 150–400)
RBC: 4.12 MIL/uL (ref 3.87–5.11)
RDW: 15.2 % (ref 11.5–15.5)
WBC: 4.5 10*3/uL (ref 4.0–10.5)
nRBC: 0 % (ref 0.0–0.2)

## 2023-11-11 LAB — MAGNESIUM
Magnesium: 1.4 mg/dL — ABNORMAL LOW (ref 1.7–2.4)
Magnesium: 2.6 mg/dL — ABNORMAL HIGH (ref 1.7–2.4)

## 2023-11-11 LAB — URINALYSIS, ROUTINE W REFLEX MICROSCOPIC
Bilirubin Urine: NEGATIVE
Glucose, UA: NEGATIVE mg/dL
Hgb urine dipstick: NEGATIVE
Ketones, ur: NEGATIVE mg/dL
Leukocytes,Ua: NEGATIVE
Nitrite: NEGATIVE
Protein, ur: NEGATIVE mg/dL
Specific Gravity, Urine: 1.023 (ref 1.005–1.030)
pH: 9 — ABNORMAL HIGH (ref 5.0–8.0)

## 2023-11-11 LAB — TROPONIN I (HIGH SENSITIVITY): Troponin I (High Sensitivity): 2 ng/L (ref <18)

## 2023-11-11 MED ORDER — SMOG ENEMA
960.0000 mL | Freq: Once | RECTAL | Status: DC
  Filled 2023-11-11: qty 960

## 2023-11-11 MED ORDER — MAGNESIUM SULFATE 4 GM/100ML IV SOLN
4.0000 g | Freq: Once | INTRAVENOUS | Status: AC
  Administered 2023-11-11: 4 g via INTRAVENOUS
  Filled 2023-11-11: qty 100

## 2023-11-11 MED ORDER — POTASSIUM CHLORIDE CRYS ER 20 MEQ PO TBCR
40.0000 meq | EXTENDED_RELEASE_TABLET | Freq: Once | ORAL | Status: AC
  Administered 2023-11-11: 40 meq via ORAL
  Filled 2023-11-11: qty 2

## 2023-11-11 MED ORDER — SMOG ENEMA
300.0000 mL | Freq: Once | RECTAL | Status: AC
  Administered 2023-11-11: 300 mL via RECTAL
  Filled 2023-11-11: qty 960

## 2023-11-11 NOTE — TOC Initial Note (Addendum)
Transition of Care [facility]) - Initial/Assessment Note    Patient Details  Name: [name] MRN: [id number] Date of Birth: 1981/10/19  [facility] CM/SW Contact:    Marquita Palms, LCSW Phone Number: 11/11/2023, 10:17 AM  Clinical Narrative:                  CSW met with patient bedside to discuss needs.. Patient hard to understand, CSW repeated everything to verify needs. Patient reported [facility] is out of business and she needs help before she goes home. CSW spoke with Pam of [facility], infusion company concerning possible home infusion. Pam reported they could not accept her. CSW sent message to [place] with [facility] and she reported that patients mother declined services on 11/21. CSW awaiting call back from company concerning infusion.  [facility] [phone], this is the number for [facility]. LVM for call back.        Patient Goals and CMS Choice   Patient wants to go home with infusion services.          Expected Discharge Plan and Services    Home with infusion                                           Prior Living Arrangements/Services    Patient lives home with her mother                   Activities of Daily Living      Permission Sought/Granted                  Emotional Assessment              Admission diagnosis:  Electrolyte disturbance [E87.8] Patient Active Problem List   Diagnosis Date Noted   Hypotension 11/10/2023   Ground-level fall 11/10/2023   Peripheral neuropathy 10/17/2023   Chest wall pain 10/17/2023   Electrolyte disturbance 09/19/2023   Palpitation 09/19/2023   Thrombocytopenia (HCC) 09/19/2023   Protein-calorie malnutrition, moderate (HCC) 09/19/2023   DVT (deep venous thrombosis) (HCC) 09/19/2023   Grade I hemorrhoids 12/13/2022   Constipation 12/11/2022   GI bleeding 12/10/2022   Essential hypertension 12/10/2022   IBS (irritable bowel syndrome) 12/10/2022    GERD without esophagitis 12/10/2022   Hypokalemia 12/10/2022   Rectal bleeding 12/10/2022   Influenza 10/25/2022   Bladder spasms 10/15/2022   Cholecystitis, chronic 01/17/2018   Conductive hearing loss of right ear with unrestricted hearing of left ear 03/21/2017   Right ovarian cyst 05/31/2015   Abdominal pain, chronic, right lower quadrant 05/23/2015   Hypomagnesemia 03/02/2014   Back pain 03/02/2014   Sinus bradycardia 05/31/2013   Cerebral palsy, unspecified (HCC) 06/27/2012   PCP:  Dione Housekeeper, MD Pharmacy:   [facility] [phone] [address], [place] - [address] AT [facility] [address] [place] [address] Phone: [phone] Fax: [phone]  [name] [address] [place] [address] Phone: [phone] Fax: [phone]     Social Determinants of Health (SDOH) Social History: SDOH Screenings   Food Insecurity: No Food Insecurity (10/17/2023)  Housing: Low Risk  (10/17/2023)  Transportation Needs: No Transportation Needs (10/17/2023)  Utilities: Not At Risk (10/17/2023)  Financial Resource Strain: Low Risk  (07/12/2023)   Received from [facility]  Social Connections: Unknown (08/02/2022)   [facility] from [facility], [facility]  Stress: Stress Concern Present (07/20/2022)   Received from [facility], [facility]  Tobacco Use: Low Risk  (11/09/2023)   Received from [facility]   SDOH Interventions:     Readmission Risk Interventions     No data to display

## 2023-11-11 NOTE — Progress Notes (Signed)
[facility]  - [place] at [facility]   PATIENT NAME: [name]    MR#:  [id number]  DATE OF BIRTH:  [birth year]  SUBJECTIVE:  no family at bedside. Patient came in with fall at home. No trauma. Getting potassium and magnesium depleted. Patient appears to be constipated. She just wants smog enema    VITALS:  Blood pressure 106/77, pulse 85, temperature 98.2 F (36.8 C), temperature source Oral, resp. rate 18, height 5\' 2"  (1.575 m), weight 42 kg, last menstrual period 06/09/2012, SpO2 99%.  PHYSICAL EXAMINATION:   GENERAL:  42 y.o.-year-old patient with no acute distress. Thin cachectic LUNGS: Normal breath sounds bilaterally CARDIOVASCULAR: S1, S2 normal. No murmur   ABDOMEN: Soft, nontender, nondistended. EXTREMITIES: No  edema b/l.    NEUROLOGIC: nonfocal  patient is alert and awake   LABORATORY PANEL:  CBC Recent Labs  Lab 11/11/23 0326  WBC 4.5  HGB 10.6*  HCT 31.9*  PLT 149*    Chemistries  Recent Labs  Lab 11/10/23 1040 11/10/23 1743 11/11/23 0320  NA 140   < > 138  K 2.6*   < > 3.5  CL 102   < > 105  CO2 26   < > 24  GLUCOSE 101*   < > 107*  BUN 19   < > 14  CREATININE 0.76   < > 0.73  CALCIUM 9.2   < > 8.9  MG 1.2*   < > 1.4*  AST 22  --   --   ALT 20  --   --   ALKPHOS 78  --   --   BILITOT 0.6  --   --    < > = values in this interval not displayed.   Cardiac Enzymes No results for input(s): "TROPONINI" in the last 168 hours. RADIOLOGY:  CT CHEST ABDOMEN PELVIS W CONTRAST  Result Date: 11/10/2023 CLINICAL DATA:  Polytrauma, blunt EXAM: CT CHEST, ABDOMEN, AND PELVIS WITH CONTRAST TECHNIQUE: Multidetector CT imaging of the chest, abdomen and pelvis was performed following the standard protocol during bolus administration of intravenous contrast. RADIATION DOSE REDUCTION: This exam was performed according to the departmental dose-optimization program which includes automated exposure control, adjustment of the mA and/or kV  according to patient size and/or use of iterative reconstruction technique. CONTRAST:  60mL OMNIPAQUE IOHEXOL 300 MG/ML  SOLN COMPARISON:  09/22/2023, 04/30/2023 FINDINGS: CT CHEST FINDINGS Cardiovascular: Right IJ chest port hand left subclavian approach pacemaker remain in place. Epicardial leads are also present. Heart size is normal. No pericardial effusion. Thoracic aorta is normal in course and caliber. Central pulmonary vasculature is within normal limits. Mediastinum/Nodes: No enlarged mediastinal, hilar, or axillary lymph nodes. Thyroid gland, trachea, and esophagus demonstrate no significant findings. Lungs/Pleura: Unchanged pleuroparenchymal scarring at the periphery of the left upper lobe. Lungs are otherwise clear. No pleural effusion or pneumothorax. Musculoskeletal: No acute osseous abnormality. No chest wall hematoma. CT ABDOMEN PELVIS FINDINGS Hepatobiliary: No focal liver abnormality is seen. Status post cholecystectomy. No biliary dilatation. Pancreas: No acute findings. Pancreatic body and tail are not well seen. Spleen: Normal in size without focal abnormality. Adrenals/Urinary Tract: Adrenal glands are unremarkable. Kidneys are normal, without renal calculi, solid lesion, or hydronephrosis. Bladder is unremarkable. Stomach/Bowel: Stomach is within normal limits. Moderate-large volume of stool throughout the colon. No evidence of bowel wall thickening, distention, or inflammatory changes. Vascular/Lymphatic: No significant vascular findings are present. No enlarged abdominal or pelvic lymph nodes. Reproductive: Status post hysterectomy. No adnexal masses.  Other: No free fluid. No abdominopelvic fluid collection. No pneumoperitoneum. No abdominal wall hernia. Musculoskeletal: No acute or significant osseous findings. IMPRESSION: 1. No acute findings within the chest, abdomen, or pelvis. 2. Moderate-large volume of stool throughout the colon. Electronically Signed   By: Duanne Guess D.O.   On:  11/10/2023 13:44   CT T-SPINE NO CHARGE  Result Date: 11/10/2023 CLINICAL DATA:  Mechanical fall yesterday.  Pain. EXAM: CT THORACIC SPINE WITHOUT CONTRAST TECHNIQUE: Multidetector CT images of the thoracic were obtained using the standard protocol without intravenous contrast. RADIATION DOSE REDUCTION: This exam was performed according to the departmental dose-optimization program which includes automated exposure control, adjustment of the mA and/or kV according to patient size and/or use of iterative reconstruction technique. COMPARISON:  CT angio chest 04/30/2023. Thoracic spine radiographs 07/30/2014. FINDINGS: Alignment: No significant listhesis is present. Straightening of the upper thoracic kyphosis is stable. Exaggerated lower thoracic kyphosis is also stable. Vertebrae: Vertebral body heights are normal. No acute fractures are present. Mild endplate degenerative changes are noted in the lower thoracic spine. No focal osseous lesions are present. Paraspinal and other soft tissues: The paraspinous soft tissues are within normal limits. The visualized lung fields are clear. A right IJ line is in satisfactory position. Defibrillator in pacing wires enter via a left subclavian approach. Disc levels: A left paramedian calcified disc protrusion is present at T6-7 without significant stenosis. Mild calcified disc is also present at T2-3 without significant stenosis. The foramina are patent bilaterally. IMPRESSION: 1. No acute fracture or traumatic subluxation. 2. Mild degenerative changes of the thoracic spine as described. Electronically Signed   By: Marin Roberts [name]   On: 11/10/2023 13:41   CT L-SPINE NO CHARGE  Result Date: 11/10/2023 CLINICAL DATA:  Mechanical fall yesterday.  Pain. EXAM: CT LUMBAR SPINE WITHOUT CONTRAST TECHNIQUE: Multidetector CT imaging of the lumbar spine was performed without intravenous contrast administration. Multiplanar CT image reconstructions were also generated.  RADIATION DOSE REDUCTION: This exam was performed according to the departmental dose-optimization program which includes automated exposure control, adjustment of the mA and/or kV according to patient size and/or use of iterative reconstruction technique. COMPARISON:  CT of the abdomen and pelvis 09/22/2023 FINDINGS: Segmentation: 5 non rib-bearing lumbar type vertebral bodies are present. The lowest fully formed vertebral body is L5. Alignment: Slight degenerative retrolisthesis at L1-2, L2-3 and L3-4 is stable. Lumbar lordosis is preserved. Vertebrae: Endplate degenerative changes are present at T12-L1 and L1-2. Vertebral body heights are maintained. No acute fractures are present. Paraspinal and other soft tissues: Atherosclerotic calcifications are present aorta and branch vessels. No aneurysm is present. Paraspinous soft tissues are otherwise within normal limits. Disc levels: T12-L1: A left paramedian calcified disc protrusion is present without significant stenosis. L1-2: A broad-based calcified disc protrusion is present. No focal stenosis is present. L2-3: Negative. L3-4: A broad-based disc protrusion is present. No focal stenosis is present. L4-5: A broad-based disc protrusion is present. Mild facet hypertrophy and ligamentum flavum thickening is noted. Moderate subarticular stenosis is worse right than left. Mild to moderate foraminal stenosis is present bilaterally. L5-S1: Moderate facet hypertrophy is worse on the right. No focal disc protrusion or stenosis is present. IMPRESSION: 1. No acute fracture or traumatic subluxation. 2. Multilevel degenerative changes of the lumbar spine as described. 3. Moderate subarticular stenosis at L4-5 is worse right than left. 4. Mild to moderate foraminal stenosis bilaterally at L4-5. 5. Moderate facet hypertrophy at L5-S1 is worse on the right. 6.  Aortic Atherosclerosis (  ICD10-I70.0). Electronically Signed   By: Marin Roberts [name]   On: 11/10/2023 13:36    CT Cervical Spine Wo Contrast  Result Date: 11/10/2023 CLINICAL DATA:  Mechanical fall yesterday.  Ataxia. EXAM: CT CERVICAL SPINE WITHOUT CONTRAST TECHNIQUE: Multidetector CT imaging of the cervical spine was performed without intravenous contrast. Multiplanar CT image reconstructions were also generated. RADIATION DOSE REDUCTION: This exam was performed according to the departmental dose-optimization program which includes automated exposure control, adjustment of the mA and/or kV according to patient size and/or use of iterative reconstruction technique. COMPARISON:  None Available. FINDINGS: Alignment: Slight retrolisthesis present at C5-6. Slight degenerative retrolisthesis is present at C5-6. Alignment is otherwise anatomic. Slight reversal of the normal cervical lordosis is present. Skull base and vertebrae: A remote type 1 dens fracture is well corticated. No acute fractures are present. No focal osseous lesions are present. Soft tissues and spinal canal: No prevertebral fluid or swelling. No visible canal hematoma. The soft tissues the neck are otherwise unremarkable. Disc levels: Uncovertebral spurring at a rightward disc osteophyte complex are present at C3-4 with partial effacement of ventral CSF and moderate right foraminal stenosis. Uncovertebral spurring contributes to mild foraminal narrowing, left greater than right at C5-6. A broad-based disc osteophyte complex partially effaces the ventral CSF and results in moderate foraminal stenosis bilaterally at C6-7. Upper chest: The lung apices are clear. The thoracic inlet is within normal limits. A right IJ catheter is in place. IMPRESSION: 1. No acute fracture or traumatic subluxation. 2. Remote type 1 dens fracture. 3. Multilevel degenerative changes of the cervical spine as described. Electronically Signed   By: Marin Roberts [name]   On: 11/10/2023 13:32   CT HEAD WO CONTRAST ( )  Result Date: 11/10/2023 CLINICAL DATA:  Mechanical  fall yesterday. EXAM: CT HEAD WITHOUT CONTRAST TECHNIQUE: Contiguous axial images were obtained from the base of the skull through the vertex without intravenous contrast. RADIATION DOSE REDUCTION: This exam was performed according to the departmental dose-optimization program which includes automated exposure control, adjustment of the mA and/or kV according to patient size and/or use of iterative reconstruction technique. COMPARISON:  CT head without contrast 05/29/2023 FINDINGS: Brain: No acute infarct, hemorrhage, or mass lesion is present. 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 Basal ganglia are otherwise within normal limits. No significant extraaxial fluid collection is present. The ventricles are of normal size. The brainstem and cerebellum are within normal limits. Midline structures are within normal limits. Vascular: No hyperdense vessel or unexpected calcification. Skull: Calvarium is intact. No focal lytic or blastic lesions are present. No significant extracranial soft tissue lesion is present. Sinuses/Orbits: The paranasal sinuses and mastoid air cells are clear. The globes and orbits are within normal limits. IMPRESSION: 1. No acute intracranial abnormality or significant interval change. 2. 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 Electronically Signed   By: Marin Roberts [name]   On: 11/10/2023 13:28   DG Knee 2 Views Left  Result Date: 11/10/2023 CLINICAL DATA:  Fall, pain EXAM: LEFT KNEE - 1-2 VIEW COMPARISON:  05/29/2023 FINDINGS: No evidence of fracture, dislocation, or joint effusion. Mild medial compartment joint space narrowing. Chondrocalcinosis. Prominent soft tissue swelling anterior to the tibial tuberosity and within the prepatellar region. IMPRESSION: Prominent soft tissue swelling anterior to the tibial tuberosity and within the prepatellar region. No acute fracture or  dislocation. Electronically Signed   By: Duanne Guess D.O.   On: 11/10/2023 12:31   DG  Chest 2 View  Result Date: 11/10/2023 CLINICAL DATA:  Fall EXAM: CHEST - 2 VIEW COMPARISON:  10/16/2023 FINDINGS: Right IJ chest port remains in place. Left subclavian approach pacemaker and epicardial pacemaker are unchanged in positioning. Normal heart size. No focal airspace consolidation, pleural effusion, or pneumothorax. IMPRESSION: No active cardiopulmonary disease. Electronically Signed   By: Duanne Guess D.O.   On: 11/10/2023 11:42   DG Shoulder Left  Result Date: 11/10/2023 CLINICAL DATA:  Fall, left shoulder pain EXAM: LEFT SHOULDER - 2+ VIEW COMPARISON:  None Available. FINDINGS: There is no evidence of fracture or dislocation. Minimal degenerative changes of the acromioclavicular and glenohumeral joints. Soft tissues are unremarkable. IMPRESSION: No acute fracture or dislocation of the left shoulder. Electronically Signed   By: Duanne Guess D.O.   On: 11/10/2023 11:41    Assessment and Plan   MARCENIA MAWSON is a 42 y.o. 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  pt states that she has been getting around her home with her wheelchair but also crawls around at times.  She had 2 falls recently and did hit her head 1 time, but denies any loss of consciousness.  CT head, chest, abdomen, spine all with no acute findings.  Chest x-ray with no acute findings.  Knee and shoulder x-rays without acute findings.   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   --She follows with [facility].  -- Per chart review, she seems to have had a lot of  difficulty with receiving her magnesium infusions; telephone notes indicate both patient noncompliance and insurance difficulties. - Per last nephrology office visit, restart magnesium 400 mg TID and potassium 20 mEq BID - TOC consultation to assist with arranging home infusions since her recent home health agency close down. -- pharmacy to replete electrolyte   Ground-level fall -Patient states that she fell out of her wheelchair when she had a lower back spasm.  She notes that spasms are infrequent for her.  Full pan scan negative for acute abnormalities.  - Continue home tramadol as needed - Management of electrolytes as noted above   Hypotension --History of chronic hypotension, likely due to protein malnutrition and potential dysautonomia in the setting of cerebral palsy.  - Continue home midodrine   Cerebral palsy, unspecified (HCC) --Patient has a history of cerebral palsy with spastic quadriparesis.  She gets around her home with a wheelchair, but notes that she does crawl around occasionally as well.   DVT (deep venous thrombosis) (HCC) - Continue home Eliquis   Thrombocytopenia (HCC) -Chronic and stable.  No evidence of bleeding on examination.   Palpitation History of frequent palpitations, previously on metoprolol however discontinued due to hypotension.  Patient states palpitations have been more bothersome lately.   Constipation History of severe constipation with moderate stool burden seen on CT imaging. --SMOG enema per pt request   Advance Care Planning:   Code Status: Full Code   Consults: None  Family Communication: No family at bedside DVT Prophylaxis :eliquis Level of care: Telemetry Medical Status is: Observation The patient remains OBS appropriate and will d/c before 2 midnights.    TOTAL TIME TAKING CARE OF THIS PATIENT: 35 minutes.  >50% time spent  on counselling and coordination of care  Note: This dictation was prepared with Dragon dictation along  with smaller phrase technology. Any transcriptional errors that result from this process are unintentional.  Enedina Finner M.D    [facility]   CC: Primary care physician; Dione Housekeeper, MD

## 2023-11-11 NOTE — ED Notes (Signed)
This tech and EDT trainee Alexia positioned pt in bed after bed pan use. Pt is comfortable and has no complaints.

## 2023-11-11 NOTE — ED Notes (Signed)
ED TO INPATIENT HANDOFF REPORT  ED Nurse Name and Phone #: Al Corpus, RN [phone]  S Name/Age/Gender [name] 42 y.o. female Room/Bed: ED38A/ED38A  Code Status   Code Status: Full Code  Home/SNF/Other Home Patient oriented to: self, place, time, and situation Is this baseline? Yes   Triage Complete: Triage complete  Chief Complaint Electrolyte disturbance [E87.8]  Triage Note Pt BIB [facility]. Pt reports falling yesterday and struck her pacemaker site, with on trauma. Pt also states chronic constipation. EMS reports pt may have a low magnesium with a potassium 2.9 as per pt Pt reports hitting her head, without loss of consciousness and is on a blood thinner.  Pt is wheelchair bound but was crawling to the bathroom when her back went out.    EMS vitals  135/80 BP 112cbg    Allergies Allergies  Allergen Reactions   Duloxetine     Other reaction(s): Other (See Comments), Palpitations Other reaction(s): Hallucinations, Hyperactive behavior (finding), Other (See Comments) Stressed out     Flecainide     Other reaction(s): Other (See Comments) Other reaction(s): Hypomagnesemia (disorder), Other (See Comments) Other Reaction: QUESTION HYPOMAGNESEMIA     Fluoxetine     Other reaction(s): Nausea And Vomiting, Other (See Comments) Other reaction(s): Other (See Comments) Other Reaction: INCREASED HR  causes irritability and irrational behavior "I go crazy"   Gabapentin     Goes crazy Other reaction(s): Other (See Comments) Other reaction(s): Hyperactive behavior (finding), Mental Status Changes (intolerance), Other (See Comments) Goes crazy Hallucinations   Baclofen     Other reaction(s): Other (See Comments), Other (See Comments) Weakness  Other reaction(s): Other (See Comments) Weakness and loose Weakness  Other reaction(s):  Weakness     Cephalosporins     Other reaction(s): Hives, Rash Received pre-op vancomycin and cefazolin 07/20/16. Developed  urticarial rash on bilateral lower extremities + slight periorbital swelling. Treated with diphenhydramine. Received pre-op vancomycin and cefazolin 07/20/16. Developed urticarial rash on bilateral lower extremities + slight periorbital swelling. Treated with diphenhydramine. Received pre-op vancomycin and cefazolin 07/20/16. Developed urticarial rash on bilateral lower extremities + slight periorbital swelling. Treated with diphenhydramine.   Sulfasalazine     Other reaction(s): Nausea And Vomiting   Sulfa Antibiotics Nausea And Vomiting   Vancomycin     Other reaction(s): Hives Received pre-op vancomycin and cefazolin 07/20/16. Developed urticarial rash on bilateral lower extremities + slight periorbital swelling. Treated with diphenhydramine. Received pre-op vancomycin and cefazolin 07/20/16. Developed urticarial rash on bilateral lower extremities + slight periorbital swelling. Treated with diphenhydramine. Received pre-op vancomycin and cefazolin 07/20/16. Developed urticarial rash on bilateral lower extremities + slight periorbital swelling. Treated with diphenhydramine.   Cefazolin Rash    Tolerated ceftriaxone 2023 and cephalexin 2022    Level of Care/Admitting Diagnosis ED Disposition     ED Disposition  Admit   Condition  --   Comment  Hospital Area: [facility] [[id number]]  Level of Care: Telemetry Medical [104]  Covid Evaluation: Asymptomatic - no recent exposure (last 10 days) testing not required  Diagnosis: Electrolyte disturbance [276014]  Admitting Physician: Verdene Lennert [[id number]]  Attending Physician: Verdene Lennert [[id number]]          B Medical/Surgery History Past Medical History:  Diagnosis Date   Carpal tunnel syndrome    Cerebral palsy (HCC)    Chronic back pain    Gitelman syndrome    Hypokalemia    now resolved   Magnesium deficiency    Pacemaker  PONV (postoperative nausea and vomiting)    Postural orthostatic tachycardia  syndrome    s/p pacemaker placement.   RSD (reflex sympathetic dystrophy)    right foot,    RSD (reflex sympathetic dystrophy)    SA node dysfunction (HCC) 2008   Past Surgical History:  Procedure Laterality Date   ABDOMINAL HYSTERECTOMY Left 2013   Dr. Tiburcio Pea   COLONOSCOPY WITH PROPOFOL N/A 12/11/2022   Procedure: COLONOSCOPY WITH PROPOFOL;  Surgeon: Toney Reil, MD;  Location: [facility] ENDOSCOPY;  Service: Gastroenterology;  Laterality: N/A;   COLONOSCOPY WITH PROPOFOL N/A 12/13/2022   Procedure: COLONOSCOPY WITH PROPOFOL;  Surgeon: Toney Reil, MD;  Location: [facility] ENDOSCOPY;  Service: Gastroenterology;  Laterality: N/A;   INSERT / REPLACE / REMOVE PACEMAKER     ORTHOPEDIC SURGERY     PACEMAKER INSERTION     PORTACATH PLACEMENT       A IV Location/Drains/Wounds Patient Lines/Drains/Airways Status     Active Line/Drains/Airways     Name Placement date Placement time Site Days   Peripheral IV 11/10/23 20 G 1" Anterior;Right Forearm 11/10/23  1238  Forearm  1            Intake/Output Last 24 hours No intake or output data in the 24 hours ending 11/11/23 1335  Labs/Imaging Results for orders placed or performed during the hospital encounter of 11/10/23 (from the past 48 hour(s))  CBC     Status: Abnormal   Collection Time: 11/10/23 10:40 AM  Result Value Ref Range   WBC 4.6 4.0 - 10.5 K/uL   RBC 4.22 3.87 - 5.11 MIL/uL   Hemoglobin 10.9 (L) 12.0 - 15.0 g/dL   HCT 16.1 (L) 09.6 - 04.5 %   MCV 78.7 (L) 80.0 - 100.0 fL   MCH 25.8 (L) 26.0 - 34.0 pg   MCHC 32.8 30.0 - 36.0 g/dL   RDW 40.9 81.1 - 91.4 %   Platelets 130 (L) 150 - 400 K/uL   nRBC 0.0 0.0 - 0.2 %    Comment: Performed at [facility], [address] [place], [place] [address]  Comprehensive metabolic panel     Status: Abnormal   Collection Time: 11/10/23 10:40 AM  Result Value Ref Range   Sodium 140 135 - 145 mmol/L   Potassium 2.6 (LL) 3.5 - 5.1 mmol/L    Comment: CRITICAL  RESULT CALLED TO, READ BACK BY AND VERIFIED WITH LISA GERSLER 11/10/23 1223 MU    Chloride 102 98 - 111 mmol/L   CO2 26 22 - 32 mmol/L   Glucose, Bld 101 (H) 70 - 99 mg/dL    Comment: Glucose reference range applies only to samples taken after fasting for at least 8 hours.   BUN 19 6 - 20 mg/dL   Creatinine, Ser 6.21 0.44 - 1.00 mg/dL   Calcium 9.2 8.9 - 30.8 mg/dL   Total Protein 6.4 (L) 6.5 - 8.1 g/dL   Albumin 4.0 3.5 - 5.0 g/dL   AST 22 15 - 41 U/L   ALT 20 0 - 44 U/L   Alkaline Phosphatase 78 38 - 126 U/L   Total Bilirubin 0.6 <1.2 mg/dL   GFR, Estimated >65 >78 mL/min    Comment: (NOTE) Calculated using the CKD-EPI Creatinine Equation (2021)    Anion gap 12 5 - 15    Comment: Performed at [facility], [address] [place], [place] [address]  Troponin I (High Sensitivity)     Status: None   Collection Time:  11/10/23 10:40 AM  Result Value Ref Range   Troponin I (High Sensitivity) 3 <18 ng/L    Comment: (NOTE) Elevated high sensitivity troponin I (hsTnI) values and significant  changes across serial measurements may suggest ACS but many other  chronic and acute conditions are known to elevate hsTnI results.  Refer to the "Links" section for chest pain algorithms and additional  guidance. Performed at [facility], [address] [place], [place] [address]   CK     Status: None   Collection Time: 11/10/23 10:40 AM  Result Value Ref Range   Total CK 190 38 - 234 U/L    Comment: Performed at [facility], [address] [place], [place] [address]  Magnesium     Status: Abnormal   Collection Time: 11/10/23 10:40 AM  Result Value Ref Range   Magnesium 1.2 (L) 1.7 - 2.4 mg/dL    Comment: Performed at [facility], [address] [place], [place] [address]  Basic metabolic panel     Status: Abnormal   Collection Time: 11/10/23  5:43 PM  Result Value Ref Range   Sodium 140 135 - 145 mmol/L   Potassium 3.7 3.5 - 5.1 mmol/L    Chloride 104 98 - 111 mmol/L   CO2 27 22 - 32 mmol/L   Glucose, Bld 148 (H) 70 - 99 mg/dL    Comment: Glucose reference range applies only to samples taken after fasting for at least 8 hours.   BUN 16 6 - 20 mg/dL   Creatinine, Ser 8.84 0.44 - 1.00 mg/dL   Calcium 8.7 (L) 8.9 - 10.3 mg/dL   GFR, Estimated >16 >60 mL/min    Comment: (NOTE) Calculated using the CKD-EPI Creatinine Equation (2021)    Anion gap 9 5 - 15    Comment: Performed at [facility], [address] [place], [place] [address]  Magnesium     Status: None   Collection Time: 11/10/23  5:43 PM  Result Value Ref Range   Magnesium 1.8 1.7 - 2.4 mg/dL    Comment: Performed at [facility], [address] [place], [place] [address]  Basic metabolic panel     Status: Abnormal   Collection Time: 11/11/23  3:20 AM  Result Value Ref Range   Sodium 138 135 - 145 mmol/L   Potassium 3.5 3.5 - 5.1 mmol/L   Chloride 105 98 - 111 mmol/L   CO2 24 22 - 32 mmol/L   Glucose, Bld 107 (H) 70 - 99 mg/dL    Comment: Glucose reference range applies only to samples taken after fasting for at least 8 hours.   BUN 14 6 - 20 mg/dL   Creatinine, Ser 2.35 0.44 - 1.00 mg/dL   Calcium 8.9 8.9 - 57.3 mg/dL   GFR, Estimated >22 >02 mL/min    Comment: (NOTE) Calculated using the CKD-EPI Creatinine Equation (2021)    Anion gap 9 5 - 15    Comment: Performed at [facility], [address] [place], [place] [address]  Magnesium     Status: Abnormal   Collection Time: 11/11/23  3:20 AM  Result Value Ref Range   Magnesium 1.4 (L) 1.7 - 2.4 mg/dL    Comment: Performed at [facility], [address] [place], [place] [address]  Urinalysis, Routine w reflex microscopic -Urine, Clean Catch     Status: Abnormal   Collection Time: 11/11/23  3:20 AM  Result Value Ref Range   Color,  Urine YELLOW (A) YELLOW   APPearance HAZY (A) CLEAR   Specific Gravity, Urine 1.023 1.005 - 1.030   pH 9.0 (H) 5.0 - 8.0    Glucose, UA NEGATIVE NEGATIVE mg/dL   Hgb urine dipstick NEGATIVE NEGATIVE   Bilirubin Urine NEGATIVE NEGATIVE   Ketones, ur NEGATIVE NEGATIVE mg/dL   Protein, ur NEGATIVE NEGATIVE mg/dL   Nitrite NEGATIVE NEGATIVE   Leukocytes,Ua NEGATIVE NEGATIVE    Comment: Performed at [facility], [address] [place], [place] [address]  Troponin I (High Sensitivity)     Status: None   Collection Time: 11/11/23  3:20 AM  Result Value Ref Range   Troponin I (High Sensitivity) 2 <18 ng/L    Comment: (NOTE) Elevated high sensitivity troponin I (hsTnI) values and significant  changes across serial measurements may suggest ACS but many other  chronic and acute conditions are known to elevate hsTnI results.  Refer to the "Links" section for chest pain algorithms and additional  guidance. Performed at [facility], [address] [place], [place] [address]   CBC with Differential/Platelet     Status: Abnormal   Collection Time: 11/11/23  3:26 AM  Result Value Ref Range   WBC 4.5 4.0 - 10.5 K/uL   RBC 4.12 3.87 - 5.11 MIL/uL   Hemoglobin 10.6 (L) 12.0 - 15.0 g/dL   HCT 64.4 (L) 03.4 - 74.2 %   MCV 77.4 (L) 80.0 - 100.0 fL   MCH 25.7 (L) 26.0 - 34.0 pg   MCHC 33.2 30.0 - 36.0 g/dL   RDW 59.5 63.8 - 75.6 %   Platelets 149 (L) 150 - 400 K/uL   nRBC 0.0 0.0 - 0.2 %   Neutrophils Relative % 50 %   Neutro Abs 2.3 1.7 - 7.7 K/uL   Lymphocytes Relative 39 %   Lymphs Abs 1.7 0.7 - 4.0 K/uL   Monocytes Relative 9 %   Monocytes Absolute 0.4 0.1 - 1.0 K/uL   Eosinophils Relative 1 %   Eosinophils Absolute 0.0 0.0 - 0.5 K/uL   Basophils Relative 1 %   Basophils Absolute 0.0 0.0 - 0.1 K/uL   Immature Granulocytes 0 %   Abs Immature Granulocytes 0.02 0.00 - 0.07 K/uL    Comment: Performed at [facility], [address] [place], [place] [address]   CT CHEST ABDOMEN PELVIS W CONTRAST  Result Date: 11/10/2023 CLINICAL DATA:  Polytrauma, blunt EXAM: CT CHEST, ABDOMEN,  AND PELVIS WITH CONTRAST TECHNIQUE: Multidetector CT imaging of the chest, abdomen and pelvis was performed following the standard protocol during bolus administration of intravenous contrast. RADIATION DOSE REDUCTION: This exam was performed according to the departmental dose-optimization program which includes automated exposure control, adjustment of the mA and/or kV according to patient size and/or use of iterative reconstruction technique. CONTRAST:  60mL OMNIPAQUE IOHEXOL 300 MG/ML  SOLN COMPARISON:  09/22/2023, 04/30/2023 FINDINGS: CT CHEST FINDINGS Cardiovascular: Right IJ chest port hand left subclavian approach pacemaker remain in place. Epicardial leads are also present. Heart size is normal. No pericardial effusion. Thoracic aorta is normal in course and caliber. Central pulmonary vasculature is within normal limits. Mediastinum/Nodes: No enlarged mediastinal, hilar, or axillary lymph nodes. Thyroid gland, trachea, and esophagus demonstrate no significant findings. Lungs/Pleura: Unchanged pleuroparenchymal scarring at the periphery of the left upper lobe. Lungs are otherwise clear. No pleural effusion or pneumothorax. Musculoskeletal: No acute osseous abnormality. No chest wall hematoma. CT ABDOMEN PELVIS FINDINGS Hepatobiliary: No focal liver abnormality is seen. Status post  cholecystectomy. No biliary dilatation. Pancreas: No acute findings. Pancreatic body and tail are not well seen. Spleen: Normal in size without focal abnormality. Adrenals/Urinary Tract: Adrenal glands are unremarkable. Kidneys are normal, without renal calculi, solid lesion, or hydronephrosis. Bladder is unremarkable. Stomach/Bowel: Stomach is within normal limits. Moderate-large volume of stool throughout the colon. No evidence of bowel wall thickening, distention, or inflammatory changes. Vascular/Lymphatic: No significant vascular findings are present. No enlarged abdominal or pelvic lymph nodes. Reproductive: Status post  hysterectomy. No adnexal masses. Other: No free fluid. No abdominopelvic fluid collection. No pneumoperitoneum. No abdominal wall hernia. Musculoskeletal: No acute or significant osseous findings. IMPRESSION: 1. No acute findings within the chest, abdomen, or pelvis. 2. Moderate-large volume of stool throughout the colon. Electronically Signed   By: Duanne Guess D.O.   On: 11/10/2023 13:44   CT T-SPINE NO CHARGE  Result Date: 11/10/2023 CLINICAL DATA:  Mechanical fall yesterday.  Pain. EXAM: CT THORACIC SPINE WITHOUT CONTRAST TECHNIQUE: Multidetector CT images of the thoracic were obtained using the standard protocol without intravenous contrast. RADIATION DOSE REDUCTION: This exam was performed according to the departmental dose-optimization program which includes automated exposure control, adjustment of the mA and/or kV according to patient size and/or use of iterative reconstruction technique. COMPARISON:  CT angio chest 04/30/2023. Thoracic spine radiographs 07/30/2014. FINDINGS: Alignment: No significant listhesis is present. Straightening of the upper thoracic kyphosis is stable. Exaggerated lower thoracic kyphosis is also stable. Vertebrae: Vertebral body heights are normal. No acute fractures are present. Mild endplate degenerative changes are noted in the lower thoracic spine. No focal osseous lesions are present. Paraspinal and other soft tissues: The paraspinous soft tissues are within normal limits. The visualized lung fields are clear. A right IJ line is in satisfactory position. Defibrillator in pacing wires enter via a left subclavian approach. Disc levels: A left paramedian calcified disc protrusion is present at T6-7 without significant stenosis. Mild calcified disc is also present at T2-3 without significant stenosis. The foramina are patent bilaterally. IMPRESSION: 1. No acute fracture or traumatic subluxation. 2. Mild degenerative changes of the thoracic spine as described.  Electronically Signed   By: Marin Roberts [name]   On: 11/10/2023 13:41   CT L-SPINE NO CHARGE  Result Date: 11/10/2023 CLINICAL DATA:  Mechanical fall yesterday.  Pain. EXAM: CT LUMBAR SPINE WITHOUT CONTRAST TECHNIQUE: Multidetector CT imaging of the lumbar spine was performed without intravenous contrast administration. Multiplanar CT image reconstructions were also generated. RADIATION DOSE REDUCTION: This exam was performed according to the departmental dose-optimization program which includes automated exposure control, adjustment of the mA and/or kV according to patient size and/or use of iterative reconstruction technique. COMPARISON:  CT of the abdomen and pelvis 09/22/2023 FINDINGS: Segmentation: 5 non rib-bearing lumbar type vertebral bodies are present. The lowest fully formed vertebral body is L5. Alignment: Slight degenerative retrolisthesis at L1-2, L2-3 and L3-4 is stable. Lumbar lordosis is preserved. Vertebrae: Endplate degenerative changes are present at T12-L1 and L1-2. Vertebral body heights are maintained. No acute fractures are present. Paraspinal and other soft tissues: Atherosclerotic calcifications are present aorta and branch vessels. No aneurysm is present. Paraspinous soft tissues are otherwise within normal limits. Disc levels: T12-L1: A left paramedian calcified disc protrusion is present without significant stenosis. L1-2: A broad-based calcified disc protrusion is present. No focal stenosis is present. L2-3: Negative. L3-4: A broad-based disc protrusion is present. No focal stenosis is present. L4-5: A broad-based disc protrusion is present. Mild facet hypertrophy and ligamentum flavum thickening is noted.  Moderate subarticular stenosis is worse right than left. Mild to moderate foraminal stenosis is present bilaterally. L5-S1: Moderate facet hypertrophy is worse on the right. No focal disc protrusion or stenosis is present. IMPRESSION: 1. No acute fracture or traumatic  subluxation. 2. Multilevel degenerative changes of the lumbar spine as described. 3. Moderate subarticular stenosis at L4-5 is worse right than left. 4. Mild to moderate foraminal stenosis bilaterally at L4-5. 5. Moderate facet hypertrophy at L5-S1 is worse on the right. 6.  Aortic Atherosclerosis (ICD10-I70.0). Electronically Signed   By: Marin Roberts [name]   On: 11/10/2023 13:36   CT Cervical Spine Wo Contrast  Result Date: 11/10/2023 CLINICAL DATA:  Mechanical fall yesterday.  Ataxia. EXAM: CT CERVICAL SPINE WITHOUT CONTRAST TECHNIQUE: Multidetector CT imaging of the cervical spine was performed without intravenous contrast. Multiplanar CT image reconstructions were also generated. RADIATION DOSE REDUCTION: This exam was performed according to the departmental dose-optimization program which includes automated exposure control, adjustment of the mA and/or kV according to patient size and/or use of iterative reconstruction technique. COMPARISON:  None Available. FINDINGS: Alignment: Slight retrolisthesis present at C5-6. Slight degenerative retrolisthesis is present at C5-6. Alignment is otherwise anatomic. Slight reversal of the normal cervical lordosis is present. Skull base and vertebrae: A remote type 1 dens fracture is well corticated. No acute fractures are present. No focal osseous lesions are present. Soft tissues and spinal canal: No prevertebral fluid or swelling. No visible canal hematoma. The soft tissues the neck are otherwise unremarkable. Disc levels: Uncovertebral spurring at a rightward disc osteophyte complex are present at C3-4 with partial effacement of ventral CSF and moderate right foraminal stenosis. Uncovertebral spurring contributes to mild foraminal narrowing, left greater than right at C5-6. A broad-based disc osteophyte complex partially effaces the ventral CSF and results in moderate foraminal stenosis bilaterally at C6-7. Upper chest: The lung apices are clear. The  thoracic inlet is within normal limits. A right IJ catheter is in place. IMPRESSION: 1. No acute fracture or traumatic subluxation. 2. Remote type 1 dens fracture. 3. Multilevel degenerative changes of the cervical spine as described. Electronically Signed   By: Marin Roberts [name]   On: 11/10/2023 13:32   CT HEAD WO CONTRAST ( )  Result Date: 11/10/2023 CLINICAL DATA:  Mechanical fall yesterday. EXAM: CT HEAD WITHOUT CONTRAST TECHNIQUE: Contiguous axial images were obtained from the base of the skull through the vertex without intravenous contrast. RADIATION DOSE REDUCTION: This exam was performed according to the departmental dose-optimization program which includes automated exposure control, adjustment of the mA and/or kV according to patient size and/or use of iterative reconstruction technique. COMPARISON:  CT head without contrast 05/29/2023 FINDINGS: Brain: No acute infarct, hemorrhage, or mass lesion is present. 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 Basal ganglia are otherwise within normal limits. No significant extraaxial fluid collection is present. The ventricles are of normal size. The brainstem and cerebellum are within normal limits. Midline structures are within normal limits. Vascular: No hyperdense vessel or unexpected calcification. Skull: Calvarium is intact. No focal lytic or blastic lesions are present. No significant extracranial soft tissue lesion is present. Sinuses/Orbits: The paranasal sinuses and mastoid air cells are clear. The globes and orbits are within normal limits. IMPRESSION: 1. No acute intracranial abnormality or significant interval change. 2. 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 Electronically Signed   By: Marin Roberts [name]   On: 11/10/2023 13:28   DG Knee 2 Views Left  Result Date: 11/10/2023 CLINICAL DATA:  Fall, pain EXAM: LEFT KNEE - 1-2 VIEW  COMPARISON:  05/29/2023 FINDINGS: No evidence of fracture, dislocation, or joint effusion. Mild medial compartment joint space narrowing. Chondrocalcinosis. Prominent soft tissue swelling anterior to the tibial tuberosity and within the prepatellar region. IMPRESSION: Prominent soft tissue swelling anterior to the tibial tuberosity and within the prepatellar region. No acute fracture or dislocation. Electronically Signed   By: Duanne Guess D.O.   On: 11/10/2023 12:31   DG Chest 2 View  Result Date: 11/10/2023 CLINICAL DATA:  Fall EXAM: CHEST - 2 VIEW COMPARISON:  10/16/2023 FINDINGS: Right IJ chest port remains in place. Left subclavian approach pacemaker and epicardial pacemaker are unchanged in positioning. Normal heart size. No focal airspace consolidation, pleural effusion, or pneumothorax. IMPRESSION: No active cardiopulmonary disease. Electronically Signed   By: Duanne Guess D.O.   On: 11/10/2023 11:42   DG Shoulder Left  Result Date: 11/10/2023 CLINICAL DATA:  Fall, left shoulder pain EXAM: LEFT SHOULDER - 2+ VIEW COMPARISON:  None Available. FINDINGS: There is no evidence of fracture or dislocation. Minimal degenerative changes of the acromioclavicular and glenohumeral joints. Soft tissues are unremarkable. IMPRESSION: No acute fracture or dislocation of the left shoulder. Electronically Signed   By: Duanne Guess D.O.   On: 11/10/2023 11:41    Pending Labs Unresulted Labs (From admission, onward)     Start     Ordered   11/11/23 1256  Magnesium  ONCE - STAT,   STAT        11/11/23 1255            Vitals/Pain Today's Vitals   11/11/23 0700 11/11/23 0800 11/11/23 1200 11/11/23 1300  BP: 108/65 107/70 111/70 106/77  Pulse:  87  85  Resp:  18  18  Temp:  98.3 F (36.8 C)    TempSrc:  Oral    SpO2:  100% 100% 99%  Weight:      Height:      PainSc:  0-No pain  0-No pain    Isolation Precautions No active isolations  Medications Medications  magnesium oxide  (MAG-OX) tablet 400 mg (400 mg Oral Patient Refused/Not Given 11/11/23 1033)  potassium chloride SA (KLOR-CON M) CR tablet 20 mEq (20 mEq Oral Given 11/11/23 1038)  sodium chloride flush (NS) 0.9 % injection 3 mL (3 mLs Intravenous Given 11/11/23 1039)  acetaminophen (TYLENOL) tablet 650 mg (has no administration in time range)    Or  acetaminophen (TYLENOL) suppository 650 mg (has no administration in time range)  ondansetron (ZOFRAN) tablet 4 mg (has no administration in time range)    Or  ondansetron (ZOFRAN) injection 4 mg (has no administration in time range)  bisacodyl (DULCOLAX) EC tablet 10 mg (10 mg Oral Patient Refused/Not Given 11/11/23 1034)  cholecalciferol (VITAMIN D3) tablet 2,000 Units (2,000 Units Oral Given 11/11/23 1038)  cyanocobalamin (VITAMIN B12) tablet 1,000 mcg (1,000 mcg Oral Given 11/11/23 1036)  apixaban (ELIQUIS) tablet 5 mg (5 mg Oral Given 11/11/23 1038)  famotidine (PEPCID) tablet 40 mg (40 mg Oral Given 11/11/23 0105)  linaclotide (LINZESS) capsule 290 mcg (290 mcg Oral Given 11/11/23 1325)  metoCLOPramide (REGLAN) tablet 5 mg (5 mg Oral Patient Refused/Not Given 11/11/23 1035)  midodrine (PROAMATINE) tablet 10 mg (10 mg Oral Given 11/11/23 1326)  pantoprazole (PROTONIX) EC tablet 40 mg (40 mg Oral Given 11/11/23 1039)  polyethylene glycol (MIRALAX / GLYCOLAX) packet 17 g (17 g Oral Not Given 11/11/23 1034)  pregabalin (LYRICA)  capsule 150 mg (150 mg Oral Given 11/11/23 1038)  sucralfate (CARAFATE) tablet 1 g (1 g Oral Given 11/11/23 1039)  traMADol (ULTRAM) tablet 50 mg (has no administration in time range)  bisacodyl (DULCOLAX) suppository 10 mg (has no administration in time range)  methylphenidate (RITALIN) tablet 20 mg (20 mg Oral Given 11/11/23 1326)  sorbitol, magnesium hydroxide, mineral oil, glycerin (SMOG) enema (960 mLs Rectal Patient Refused/Not Given 11/11/23 0759)  sorbitol, magnesium hydroxide, mineral oil, glycerin (SMOG) enema (has no  administration in time range)  magnesium sulfate IVPB 2 g 50 mL (0 g Intravenous Stopped 11/10/23 1415)  acetaminophen (TYLENOL) tablet 1,000 mg (1,000 mg Oral Given 11/10/23 1214)  iohexol (OMNIPAQUE) 300 MG/ML solution 75 mL (60 mLs Intravenous Contrast Given 11/10/23 1251)  potassium chloride SA (KLOR-CON M) CR tablet 40 mEq (40 mEq Oral Given 11/10/23 1246)  potassium chloride 10 mEq in 100 mL IVPB (0 mEq Intravenous Stopped 11/10/23 1515)  magnesium sulfate IVPB 4 g 100 mL ( Intravenous Infusion Verify 11/11/23 1326)  potassium chloride SA (KLOR-CON M) CR tablet 40 mEq (40 mEq Oral Given 11/11/23 1038)    Mobility non-ambulatory     Focused Assessments Cardiac Assessment Handoff:  Cardiac Rhythm: Normal sinus rhythm (HR 86) Lab Results  Component Value Date   CKTOTAL 190 11/10/2023   CKMB 1.5 09/19/2013   TROPONINI <0.03 06/01/2017   Lab Results  Component Value Date   DDIMER (H) 07/10/2010    0.93        AT THE INHOUSE ESTABLISHED CUTOFF VALUE OF 0.48 ug/mL FEU, THIS ASSAY HAS BEEN DOCUMENTED IN THE LITERATURE TO HAVE A SENSITIVITY AND NEGATIVE PREDICTIVE VALUE OF AT LEAST 98 TO 99%.  THE TEST RESULT SHOULD BE CORRELATED WITH AN ASSESSMENT OF THE CLINICAL PROBABILITY OF DVT / VTE.   Does the Patient currently have chest pain? No    R Recommendations: See Admitting Provider Note  Report given to:   Additional Notes:  admitted here frequently, chronic constipation, here for recent fall and constipation, h/o gelltleman syndrome, cerebal palsy with spasticity, frail thin and cachexic. Speech garbled and slow, some stutter, but intelligible with patience, takes POs and PO meds fine, very particular with meds and med order, refused several meds dulcolax, mag ox, reglan, and enema.

## 2023-11-11 NOTE — ED Notes (Signed)
Admitting MD in to see, pt speaking on phone, given PO meds, VSS, 2nd softer grilled cheese sandwich provided per request.

## 2023-11-11 NOTE — Progress Notes (Signed)
       CROSS COVER NOTE  NAME: TRULY WADHAMS MRN: [id number] DOB : [birth year]    Concern as stated by nurse / staff   Page received from [facility] RN Patient c/o chest pain left side of chest through to her back that started after her recent fall that brought her to the hospital. Patient feels it is not her usual GERD symptoms     Pertinent findings on chart review: Admitted with electrolyte disturbance, known Gettleman syndrome. K an d mag now both corrected  Assessment and  Interventions   Assessment: Vitals:   11/10/23 1749 11/10/23 1835  BP:  (!) 124/104  Pulse: 85   Resp: 16 16  Temp:  (!) 97.4 F (36.3 C)  SpO2: 97% 100%  Patient afebrile Also endorses RUQ abdominal pain. No bm since last admit here at [facility] 11/3. Endorses chronic constipation as problem. Abdomen is soft RUQ tenderness to palpation. + BS. Denies flatus Lower back tenderness right of lower thorax upper lumbar spine, + pinpoint tenererness. + bruising  EKG atrial paced rhythm, no acute ST T wave changes  Plan: SMOG enema CBC - repeat imaging of lower spine and abdoment if hgb drop troponin     Donnie Mesa NP [facility] Cross Cover 7pm-7am - check amion for availability Pager [phone]

## 2023-11-11 NOTE — ED Notes (Signed)
Declines dinner, not what she eats or wants. Asking for [place] sandwich. Given warm blanket. Declines reglan.

## 2023-11-11 NOTE — ED Notes (Signed)
Alert, NAD, calm, interactive. Declines enema. Eating banana. SPO2 oxisensor w/o signal, will assess/ montior. Pt rolled, repositioned and given dry pad and gown.

## 2023-11-12 DIAGNOSIS — E878 Other disorders of electrolyte and fluid balance, not elsewhere classified: Secondary | ICD-10-CM | POA: Diagnosis not present

## 2023-11-12 NOTE — TOC Progression Note (Signed)
Transition of Care [facility]) - Progression Note    Patient Details  Name: [name] MRN: [id number] Date of Birth: 25-Jun-1981  Transition of [facility]) CM/SW Contact  Liliana Cline, LCSW Phone Number: 11/12/2023, 10:48 AM  Clinical Narrative:    [facility] [phone], [facility] for after hours. Spoke with [facility] Representative Byrd Hesselbach. Byrd Hesselbach stated they are not active with this patient for home infusions. Spoke with Attending Dr. Allena Katz. Patient had Attending speak with a Pharmacist over the phone yesterday who was also from [facility] and reported they do not have this patient. Patient has reported she DOES want home infusions set up and does not feel comfortable until this is set up. CSW called Pam with [employer], she states she can check if they can provide the IV potassium and magnesium at home, she will need to verify insurance coverage which cannot be done until Monday. Updated Attending MD.        Expected Discharge Plan and Services                                               Social Determinants of Health (SDOH) Interventions SDOH Screenings   Food Insecurity: No Food Insecurity (11/11/2023)  Housing: Low Risk  (11/11/2023)  Transportation Needs: No Transportation Needs (11/11/2023)  Utilities: Not At Risk (11/11/2023)  Financial Resource Strain: Low Risk  (07/12/2023)   Received from [facility]  Social Connections: Unknown (08/02/2022)   Received from [facility], [facility]  Stress: Stress Concern Present (07/20/2022)   Received from [facility], [facility]  Tobacco Use: Low Risk  (11/11/2023)    Readmission Risk Interventions     No data to display

## 2023-11-12 NOTE — Plan of Care (Signed)

## 2023-11-12 NOTE — Progress Notes (Signed)
[facility]  - [place] [facility]   PATIENT NAME: [name]    MR#:  [id number]  DATE OF BIRTH:  [birth year]  SUBJECTIVE:  no family at bedside.  Had BM per RN Bp soft. Asymptomatic Tolerating po diet  VITALS:  Blood pressure (!) 82/65, pulse 89, temperature 98 F (36.7 C), resp. rate 16, height 5\' 2"  (1.575 m), weight 42 kg, last menstrual period 06/09/2012, SpO2 100%.  PHYSICAL EXAMINATION:   GENERAL:  42 y.o.-year-old patient with no acute distress. Thin cachectic LUNGS: Normal breath sounds bilaterally CARDIOVASCULAR: S1, S2 normal. No murmur   ABDOMEN: Soft, nontender, nondistended. EXTREMITIES: No  edema b/l.    NEUROLOGIC: nonfocal  patient is alert and awake   LABORATORY PANEL:  CBC Recent Labs  Lab 11/11/23 0326  WBC 4.5  HGB 10.6*  HCT 31.9*  PLT 149*    Chemistries  Recent Labs  Lab 11/10/23 1040 11/10/23 1743 11/11/23 0320 11/11/23 2135  NA 140   < > 138  --   K 2.6*   < > 3.5  --   CL 102   < > 105  --   CO2 26   < > 24  --   GLUCOSE 101*   < > 107*  --   BUN 19   < > 14  --   CREATININE 0.76   < > 0.73  --   CALCIUM 9.2   < > 8.9  --   MG 1.2*   < > 1.4* 2.6*  AST 22  --   --   --   ALT 20  --   --   --   ALKPHOS 78  --   --   --   BILITOT 0.6  --   --   --    < > = values in this interval not displayed.   Cardiac Enzymes No results for input(s): "TROPONINI" in the last 168 hours. RADIOLOGY:  CT CHEST ABDOMEN PELVIS W CONTRAST  Result Date: 11/10/2023 CLINICAL DATA:  Polytrauma, blunt EXAM: CT CHEST, ABDOMEN, AND PELVIS WITH CONTRAST TECHNIQUE: Multidetector CT imaging of the chest, abdomen and pelvis was performed following the standard protocol during bolus administration of intravenous contrast. RADIATION DOSE REDUCTION: This exam was performed according to the departmental dose-optimization program which includes automated exposure control, adjustment of the mA and/or kV according to patient size and/or use of  iterative reconstruction technique. CONTRAST:  60mL OMNIPAQUE IOHEXOL 300 MG/ML  SOLN COMPARISON:  09/22/2023, 04/30/2023 FINDINGS: CT CHEST FINDINGS Cardiovascular: Right IJ chest port hand left subclavian approach pacemaker remain in place. Epicardial leads are also present. Heart size is normal. No pericardial effusion. Thoracic aorta is normal in course and caliber. Central pulmonary vasculature is within normal limits. Mediastinum/Nodes: No enlarged mediastinal, hilar, or axillary lymph nodes. Thyroid gland, trachea, and esophagus demonstrate no significant findings. Lungs/Pleura: Unchanged pleuroparenchymal scarring at the periphery of the left upper lobe. Lungs are otherwise clear. No pleural effusion or pneumothorax. Musculoskeletal: No acute osseous abnormality. No chest wall hematoma. CT ABDOMEN PELVIS FINDINGS Hepatobiliary: No focal liver abnormality is seen. Status post cholecystectomy. No biliary dilatation. Pancreas: No acute findings. Pancreatic body and tail are not well seen. Spleen: Normal in size without focal abnormality. Adrenals/Urinary Tract: Adrenal glands are unremarkable. Kidneys are normal, without renal calculi, solid lesion, or hydronephrosis. Bladder is unremarkable. Stomach/Bowel: Stomach is within normal limits. Moderate-large volume of stool throughout the colon. No evidence of bowel wall thickening, distention, or inflammatory  changes. Vascular/Lymphatic: No significant vascular findings are present. No enlarged abdominal or pelvic lymph nodes. Reproductive: Status post hysterectomy. No adnexal masses. Other: No free fluid. No abdominopelvic fluid collection. No pneumoperitoneum. No abdominal wall hernia. Musculoskeletal: No acute or significant osseous findings. IMPRESSION: 1. No acute findings within the chest, abdomen, or pelvis. 2. Moderate-large volume of stool throughout the colon. Electronically Signed   By: Duanne Guess D.O.   On: 11/10/2023 13:44   CT T-SPINE NO  CHARGE  Result Date: 11/10/2023 CLINICAL DATA:  Mechanical fall yesterday.  Pain. EXAM: CT THORACIC SPINE WITHOUT CONTRAST TECHNIQUE: Multidetector CT images of the thoracic were obtained using the standard protocol without intravenous contrast. RADIATION DOSE REDUCTION: This exam was performed according to the departmental dose-optimization program which includes automated exposure control, adjustment of the mA and/or kV according to patient size and/or use of iterative reconstruction technique. COMPARISON:  CT angio chest 04/30/2023. Thoracic spine radiographs 07/30/2014. FINDINGS: Alignment: No significant listhesis is present. Straightening of the upper thoracic kyphosis is stable. Exaggerated lower thoracic kyphosis is also stable. Vertebrae: Vertebral body heights are normal. No acute fractures are present. Mild endplate degenerative changes are noted in the lower thoracic spine. No focal osseous lesions are present. Paraspinal and other soft tissues: The paraspinous soft tissues are within normal limits. The visualized lung fields are clear. A right IJ line is in satisfactory position. Defibrillator in pacing wires enter via a left subclavian approach. Disc levels: A left paramedian calcified disc protrusion is present at T6-7 without significant stenosis. Mild calcified disc is also present at T2-3 without significant stenosis. The foramina are patent bilaterally. IMPRESSION: 1. No acute fracture or traumatic subluxation. 2. Mild degenerative changes of the thoracic spine as described. Electronically Signed   By: Marin Roberts [name]   On: 11/10/2023 13:41   CT L-SPINE NO CHARGE  Result Date: 11/10/2023 CLINICAL DATA:  Mechanical fall yesterday.  Pain. EXAM: CT LUMBAR SPINE WITHOUT CONTRAST TECHNIQUE: Multidetector CT imaging of the lumbar spine was performed without intravenous contrast administration. Multiplanar CT image reconstructions were also generated. RADIATION DOSE REDUCTION: This exam  was performed according to the departmental dose-optimization program which includes automated exposure control, adjustment of the mA and/or kV according to patient size and/or use of iterative reconstruction technique. COMPARISON:  CT of the abdomen and pelvis 09/22/2023 FINDINGS: Segmentation: 5 non rib-bearing lumbar type vertebral bodies are present. The lowest fully formed vertebral body is L5. Alignment: Slight degenerative retrolisthesis at L1-2, L2-3 and L3-4 is stable. Lumbar lordosis is preserved. Vertebrae: Endplate degenerative changes are present at T12-L1 and L1-2. Vertebral body heights are maintained. No acute fractures are present. Paraspinal and other soft tissues: Atherosclerotic calcifications are present aorta and branch vessels. No aneurysm is present. Paraspinous soft tissues are otherwise within normal limits. Disc levels: T12-L1: A left paramedian calcified disc protrusion is present without significant stenosis. L1-2: A broad-based calcified disc protrusion is present. No focal stenosis is present. L2-3: Negative. L3-4: A broad-based disc protrusion is present. No focal stenosis is present. L4-5: A broad-based disc protrusion is present. Mild facet hypertrophy and ligamentum flavum thickening is noted. Moderate subarticular stenosis is worse right than left. Mild to moderate foraminal stenosis is present bilaterally. L5-S1: Moderate facet hypertrophy is worse on the right. No focal disc protrusion or stenosis is present. IMPRESSION: 1. No acute fracture or traumatic subluxation. 2. Multilevel degenerative changes of the lumbar spine as described. 3. Moderate subarticular stenosis at L4-5 is worse right than left. 4. Mild  to moderate foraminal stenosis bilaterally at L4-5. 5. Moderate facet hypertrophy at L5-S1 is worse on the right. 6.  Aortic Atherosclerosis (ICD10-I70.0). Electronically Signed   By: Marin Roberts [name]   On: 11/10/2023 13:36   CT Cervical Spine Wo  Contrast  Result Date: 11/10/2023 CLINICAL DATA:  Mechanical fall yesterday.  Ataxia. EXAM: CT CERVICAL SPINE WITHOUT CONTRAST TECHNIQUE: Multidetector CT imaging of the cervical spine was performed without intravenous contrast. Multiplanar CT image reconstructions were also generated. RADIATION DOSE REDUCTION: This exam was performed according to the departmental dose-optimization program which includes automated exposure control, adjustment of the mA and/or kV according to patient size and/or use of iterative reconstruction technique. COMPARISON:  None Available. FINDINGS: Alignment: Slight retrolisthesis present at C5-6. Slight degenerative retrolisthesis is present at C5-6. Alignment is otherwise anatomic. Slight reversal of the normal cervical lordosis is present. Skull base and vertebrae: A remote type 1 dens fracture is well corticated. No acute fractures are present. No focal osseous lesions are present. Soft tissues and spinal canal: No prevertebral fluid or swelling. No visible canal hematoma. The soft tissues the neck are otherwise unremarkable. Disc levels: Uncovertebral spurring at a rightward disc osteophyte complex are present at C3-4 with partial effacement of ventral CSF and moderate right foraminal stenosis. Uncovertebral spurring contributes to mild foraminal narrowing, left greater than right at C5-6. A broad-based disc osteophyte complex partially effaces the ventral CSF and results in moderate foraminal stenosis bilaterally at C6-7. Upper chest: The lung apices are clear. The thoracic inlet is within normal limits. A right IJ catheter is in place. IMPRESSION: 1. No acute fracture or traumatic subluxation. 2. Remote type 1 dens fracture. 3. Multilevel degenerative changes of the cervical spine as described. Electronically Signed   By: Marin Roberts [name]   On: 11/10/2023 13:32   CT HEAD WO CONTRAST ( )  Result Date: 11/10/2023 CLINICAL DATA:  Mechanical fall yesterday. EXAM: CT  HEAD WITHOUT CONTRAST TECHNIQUE: Contiguous axial images were obtained from the base of the skull through the vertex without intravenous contrast. RADIATION DOSE REDUCTION: This exam was performed according to the departmental dose-optimization program which includes automated exposure control, adjustment of the mA and/or kV according to patient size and/or use of iterative reconstruction technique. COMPARISON:  CT head without contrast 05/29/2023 FINDINGS: Brain: No acute infarct, hemorrhage, or mass lesion is present. 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 Basal ganglia are otherwise within normal limits. No significant extraaxial fluid collection is present. The ventricles are of normal size. The brainstem and cerebellum are within normal limits. Midline structures are within normal limits. Vascular: No hyperdense vessel or unexpected calcification. Skull: Calvarium is intact. No focal lytic or blastic lesions are present. No significant extracranial soft tissue lesion is present. Sinuses/Orbits: The paranasal sinuses and mastoid air cells are clear. The globes and orbits are within normal limits. IMPRESSION: 1. No acute intracranial abnormality or significant interval change. 2. 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 Electronically Signed   By: Marin Roberts [name]   On: 11/10/2023 13:28    Assessment and Plan   AYME REINTS is a 42 y.o. 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  pt states that she has been getting around her home with her wheelchair  but also crawls around at times.  She had 2 falls recently and did hit her head 1 time, but denies any loss of  consciousness.  CT head, chest, abdomen, spine all with no acute findings.  Chest x-ray with no acute findings.  Knee and shoulder x-rays without acute findings.   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   --She follows with [facility].  -- Per chart review, she seems to have had a lot of difficulty with receiving her magnesium infusions; telephone notes indicate both patient noncompliance and insurance difficulties. - Per last nephrology office visit, restart magnesium 400 mg TID and potassium 20 mEq BID - TOC consultation to assist with arranging home infusions since her recent home health agency close down. -- pharmacy to replete electrolyte --mag and K stable   Ground-level fall -Patient states that she fell out of her wheelchair when she had a lower back spasm.  She notes that spasms are infrequent for her.  Full pan scan negative for acute abnormalities.  - Continue home tramadol as needed   Chronic Hypotension --History of chronic hypotension, likely due to protein malnutrition and potential dysautonomia in the setting of cerebral palsy.  - Continue home midodrine   Cerebral palsy, unspecified (HCC) --Patient has a history of cerebral palsy with spastic quadriparesis.  She gets around her home with a wheelchair, but notes that she does crawl around occasionally as well.   DVT (deep venous thrombosis) (HCC) - Continue home Eliquis   Thrombocytopenia (HCC) -Chronic and stable.  No evidence of bleeding on examination.   Palpitation History of frequent palpitations, previously on metoprolol however discontinued due to hypotension.  Patient states palpitations have been more bothersome lately.   Constipation History of severe constipation with moderate stool burden seen on CT imaging. --SMOG enema per pt request --pt had BM  TOC working to get NEW HH that  can do IV infusions at home (I believe mag and K)   Advance Care Planning:   Code Status: Full Code   Consults: None  Family Communication: No family at bedside DVT Prophylaxis :eliquis Level of care: Telemetry Medical Status is: Observation The patient remains OBS appropriate and will d/c before 2 midnights.    TOTAL TIME TAKING CARE OF THIS PATIENT: 35 minutes.  >50% time spent on counselling and coordination of care  Note: This dictation was prepared with Dragon dictation along with smaller phrase technology. Any transcriptional errors that result from this process are unintentional.  Enedina Finner M.D    [facility]   CC: Primary care physician; Dione Housekeeper, MD

## 2023-11-13 DIAGNOSIS — E878 Other disorders of electrolyte and fluid balance, not elsewhere classified: Secondary | ICD-10-CM | POA: Diagnosis not present

## 2023-11-13 LAB — BASIC METABOLIC PANEL
Anion gap: 9 (ref 5–15)
BUN: 24 mg/dL — ABNORMAL HIGH (ref 6–20)
CO2: 28 mmol/L (ref 22–32)
Calcium: 10.1 mg/dL (ref 8.9–10.3)
Chloride: 101 mmol/L (ref 98–111)
Creatinine, Ser: 0.93 mg/dL (ref 0.44–1.00)
GFR, Estimated: >60 mL/min (ref >60)
Glucose, Bld: 96 mg/dL (ref 70–99)
Potassium: 4.6 mmol/L (ref 3.5–5.1)
Sodium: 138 mmol/L (ref 135–145)

## 2023-11-13 LAB — MAGNESIUM: Magnesium: 1.5 mg/dL — ABNORMAL LOW (ref 1.7–2.4)

## 2023-11-13 MED ORDER — MAGNESIUM SULFATE 4 GM/100ML IV SOLN
4.0000 g | Freq: Once | INTRAVENOUS | Status: AC
Start: 2023-11-13 — End: 2023-11-13
  Administered 2023-11-13: 4 g via INTRAVENOUS
  Filled 2023-11-13: qty 100

## 2023-11-13 NOTE — Progress Notes (Signed)
[facility]  - [place] at [facility]   PATIENT NAME: [name]    MR#:  [id number]  DATE OF BIRTH:  June 12, 1981  SUBJECTIVE:  no family at bedside.  Had BM per RN Bp soft. Asymptomatic Tolerating po diet  VITALS:  Blood pressure (!) 86/61, pulse 86, temperature 98.3 F (36.8 C), resp. rate 18, height 5\' 2"  (1.575 m), weight 42 kg, last menstrual period 06/09/2012, SpO2 100%.  PHYSICAL EXAMINATION:   GENERAL:  42 y.o.-year-old patient with no acute distress. Thin cachectic LUNGS: Normal breath sounds bilaterally CARDIOVASCULAR: S1, S2 normal. No murmur   ABDOMEN: Soft, nontender, nondistended. EXTREMITIES: No  edema b/l.    NEUROLOGIC: nonfocal  patient is alert and awake   LABORATORY PANEL:  CBC Recent Labs  Lab 11/11/23 0326  WBC 4.5  HGB 10.6*  HCT 31.9*  PLT 149*    Chemistries  Recent Labs  Lab 11/10/23 1040 11/10/23 1743 11/13/23 1117  NA 140   < > 138  K 2.6*   < > 4.6  CL 102   < > 101  CO2 26   < > 28  GLUCOSE 101*   < > 96  BUN 19   < > 24*  CREATININE 0.76   < > 0.93  CALCIUM 9.2   < > 10.1  MG 1.2*   < > 1.5*  AST 22  --   --   ALT 20  --   --   ALKPHOS 78  --   --   BILITOT 0.6  --   --    < > = values in this interval not displayed.   Cardiac Enzymes No results for input(s): "TROPONINI" in the last 168 hours. RADIOLOGY:  No results found.  Assessment and Plan   [name] is a 42 y.o. 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  pt states that she has been getting around her home with her wheelchair but also crawls around at times.  She had 2 falls recently and did hit her head 1 time, but denies any loss of consciousness.  CT head, chest, abdomen, spine all with no acute findings.   Chest x-ray with no acute findings.  Knee and shoulder x-rays without acute findings.   Electrolyte disturbance with h/o Gitelman's syndrome --Patient has a history of chronic hypomagnesemia and hypokalemia in the setting of Gitleman's disease.   --She follows with [facility].  -- Per chart review, she seems to have had a lot of difficulty with receiving her magnesium infusions; telephone notes indicate both patient noncompliance and insurance difficulties. - Per last nephrology office visit, restart magnesium 400 mg TID and potassium 20 mEq BID - TOC consultation to assist with arranging home infusions since her recent home health agency close down. -- pharmacy to replete electrolyte   Ground-level fall -Patient states that she fell out of her wheelchair when she had a lower back spasm.  She notes that spasms are infrequent for her.  Full pan scan negative for acute abnormalities.  - Continue home tramadol as needed   Chronic Hypotension --History of chronic hypotension, likely due to protein malnutrition and potential dysautonomia in the setting of cerebral palsy.  - Continue home midodrine   Cerebral palsy, unspecified (HCC) --Patient has a history  of cerebral palsy with spastic quadriparesis.  She gets around her home with a wheelchair, but notes that she does crawl around occasionally as well.   DVT (deep venous thrombosis) (HCC) - Continue home Eliquis   Thrombocytopenia (HCC) -Chronic and stable.  No evidence of bleeding on examination.   Palpitation History of frequent palpitations, previously on metoprolol however discontinued due to hypotension.  Patient states palpitations have been more bothersome lately.   Constipation History of severe constipation with moderate stool burden seen on CT imaging. --SMOG enema per pt request --pt had BM  TOC working to get NEW HH that can do IV infusions at home (I believe mag and K)   Advance Care Planning:   Code Status: Full  Code   Consults: None  Family Communication: No family at bedside DVT Prophylaxis :eliquis Level of care: Telemetry Medical Status is: Observation The patient remains OBS appropriate and will d/c before 2 midnights.    TOTAL TIME TAKING CARE OF THIS PATIENT: 35 minutes.  >50% time spent on counselling and coordination of care  Note: This dictation was prepared with Dragon dictation along with smaller phrase technology. Any transcriptional errors that result from this process are unintentional.  Enedina Finner M.D    [facility]   CC: Primary care physician; Dione Housekeeper, MD

## 2023-11-13 NOTE — Plan of Care (Signed)

## 2023-11-14 DIAGNOSIS — E878 Other disorders of electrolyte and fluid balance, not elsewhere classified: Secondary | ICD-10-CM | POA: Diagnosis not present

## 2023-11-14 LAB — MAGNESIUM: Magnesium: 2 mg/dL (ref 1.7–2.4)

## 2023-11-14 NOTE — TOC Progression Note (Addendum)
Transition of Care [facility]) - Progression Note    Patient Details  Name: [name] MRN: [id number] Date of Birth: [birth year]  Transition of Care [facility]) CM/SW Contact  Liliana Cline, LCSW Phone Number: 11/14/2023, 9:12 AM  Clinical Narrative:    Elita Quick with [employer] inquired if patient's PCP will be ordering the infusions for home, or if a [facility] provider will. Checked with MD - patient's infusions will be ordered through her [facility] PCP. Pam states she is reaching out to [facility] to see if they can provide the home infusions for continuity of care.   Also spoke to [facility] with [facility], she confirms patient was referred to them by [place]. Florentina Addison states they could not accept the patient for home infusions. She states they could ship the infusions to the home if patient can go to a doctors office for her weekly dressing changes, or patient could come to their office (she says 56 minutes from patient's home) for her infusions. They cannot send a nurse to the home. Florentina Addison states these would be back up options if we are unable to find an in home infusion company to service the patient.  2:00- Pam with [facility] still working to see if [facility] office can accept the patient.   3:18- Pam has faxed all necessary info to [facility] at [phone] and is awaiting confirmation that they can service the patient in the home.   4:21- [facility] is stating they did not receive the faxes. Sent via encrypted email.         Expected Discharge Plan and Services                                               Social Determinants of Health (SDOH) Interventions SDOH Screenings   Food Insecurity: No Food Insecurity (11/11/2023)  Housing: Low Risk  (11/11/2023)  Transportation Needs: No Transportation Needs (11/11/2023)  Utilities: Not At Risk (11/11/2023)  Financial Resource Strain: Low Risk  (07/12/2023)   Received from [facility]  Social Connections: Unknown  (08/02/2022)   Received from [facility], [facility]  Stress: Stress Concern Present (07/20/2022)   Received from [facility], [facility]  Tobacco Use: Low Risk  (11/11/2023)    Readmission Risk Interventions     No data to display

## 2023-11-14 NOTE — Plan of Care (Signed)

## 2023-11-14 NOTE — Progress Notes (Signed)
[facility]  - [place] at [facility]   PATIENT NAME: [name]    MR#:  [id number]  DATE OF BIRTH:  [birth year]  SUBJECTIVE:  no family at bedside.  Bp soft. Asymptomatic Tolerating po diet  VITALS:  Blood pressure 92/60, pulse 90, temperature 98.2 F (36.8 C), temperature source Oral, resp. rate 16, height 5\' 2"  (1.575 m), weight 42 kg, last menstrual period 06/09/2012, SpO2 99%.  PHYSICAL EXAMINATION:   GENERAL:  42 y.o.-year-old patient with no acute distress. Thin cachectic LUNGS: Normal breath sounds bilaterally CARDIOVASCULAR: S1, S2 normal. No murmur   ABDOMEN: Soft, nontender, nondistended. EXTREMITIES: No  edema b/l.    NEUROLOGIC: nonfocal  patient is alert and awake  LABORATORY PANEL:  CBC Recent Labs  Lab 11/11/23 0326  WBC 4.5  HGB 10.6*  HCT 31.9*  PLT 149*    Chemistries  Recent Labs  Lab 11/10/23 1040 11/10/23 1743 11/13/23 1117  NA 140   < > 138  K 2.6*   < > 4.6  CL 102   < > 101  CO2 26   < > 28  GLUCOSE 101*   < > 96  BUN 19   < > 24*  CREATININE 0.76   < > 0.93  CALCIUM 9.2   < > 10.1  MG 1.2*   < > 1.5*  AST 22  --   --   ALT 20  --   --   ALKPHOS 78  --   --   BILITOT 0.6  --   --    < > = values in this interval not displayed.    RADIOLOGY:  No results found.  Assessment and Plan   BELITA CHRONIS is a 42 y.o. 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  pt states that she has been getting around her home with her wheelchair but also crawls around at times.  She had 2 falls recently and did hit her head 1 time, but denies any loss of consciousness.  CT head, chest, abdomen, spine all with no acute findings.  Chest x-ray with no acute findings.  Knee and shoulder x-rays without  acute findings.   Electrolyte disturbance with h/o Gitelman's syndrome --Patient has a history of chronic hypomagnesemia and hypokalemia in the setting of Gitleman's disease.   --She follows with [facility].  -- Per chart review, she seems to have had a lot of difficulty with receiving her magnesium infusions; telephone notes indicate both patient noncompliance and insurance difficulties. - Per last nephrology office visit, restart magnesium 400 mg TID and potassium 20 mEq BID - TOC consultation to assist with arranging home infusions since her recent home health agency close down. -- pharmacy to replete electrolyte   Ground-level fall -Patient states that she fell out of her wheelchair when she had a lower back spasm.  She notes that spasms are infrequent for her.  Full pan scan negative for acute abnormalities.  - Continue home tramadol as needed   Chronic Hypotension --History of chronic hypotension, likely due to protein malnutrition and potential dysautonomia in the setting of cerebral palsy.  - Continue home midodrine   Cerebral palsy, unspecified (HCC) --Patient has a history of cerebral palsy with spastic quadriparesis.  She gets around her home with a  wheelchair, but notes that she does crawl around occasionally as well.   DVT (deep venous thrombosis) (HCC) - Continue home Eliquis   Thrombocytopenia (HCC) -Chronic and stable.  No evidence of bleeding on examination.   Palpitation History of frequent palpitations, previously on metoprolol however discontinued due to hypotension.  Patient states palpitations have been more bothersome lately.   Constipation History of severe constipation with moderate stool burden seen on CT imaging. --SMOG enema per pt request --pt had BM  TOC working to get NEW HH that can do IV infusions at home ( mag and K)   Advance Care Planning:   Code Status: Full Code   Consults: None  Family Communication: No family at bedside DVT  Prophylaxis :eliquis Level of care: Telemetry Medical Status is: Observation The patient remains OBS appropriate and will d/c before 2 midnights.    TOTAL TIME TAKING CARE OF THIS PATIENT: 35 minutes.  >50% time spent on counselling and coordination of care  Note: This dictation was prepared with Dragon dictation along with smaller phrase technology. Any transcriptional errors that result from this process are unintentional.  Enedina Finner M.D    [facility]   CC: Primary care physician; Dione Housekeeper, MD

## 2023-11-15 DIAGNOSIS — E878 Other disorders of electrolyte and fluid balance, not elsewhere classified: Secondary | ICD-10-CM | POA: Diagnosis not present

## 2023-11-15 MED ORDER — ENSURE ENLIVE PO LIQD
237.0000 mL | Freq: Three times a day (TID) | ORAL | Status: DC
  Administered 2023-11-15 – 2023-11-16 (×5): 237 mL via ORAL

## 2023-11-15 MED ORDER — ADULT MULTIVITAMIN W/MINERALS CH
1.0000 | ORAL_TABLET | Freq: Every day | ORAL | Status: DC
  Administered 2023-11-16: 1 via ORAL
  Filled 2023-11-15 (×2): qty 1

## 2023-11-15 MED ORDER — MAGNESIUM SULFATE 2 GM/50ML IV SOLN
2.0000 g | Freq: Once | INTRAVENOUS | Status: AC
Start: 2023-11-15 — End: 2023-11-15
  Administered 2023-11-15: 2 g via INTRAVENOUS
  Filled 2023-11-15: qty 50

## 2023-11-15 NOTE — Plan of Care (Signed)

## 2023-11-15 NOTE — Care Management Obs Status (Signed)
MEDICARE OBSERVATION STATUS NOTIFICATION   Patient Details  Name: [name] MRN: [id number] Date of Birth: 1981/04/04   Medicare Observation Status Notification Given:  Yes    Chapman Fitch, RN 11/15/2023, 4:47 PM

## 2023-11-15 NOTE — TOC Progression Note (Signed)
Transition of Care [facility]) - Progression Note    Patient Details  Name: [name] MRN: [id number] Date of Birth: 11/30/1981  Transition [facility]) CM/SW Contact  Chapman Fitch, RN Phone Number: 11/15/2023, 4:34 PM  Clinical Narrative:     - per pam with [facility] they are unable to accept due to recent shortages and conserving fluids - per Marchelle Folks with [facility] they are unable to accept due being out of network with insurance  Reached back out to [place] with [facility] [phone].  Inquired if they could accept referral and deliver the medication to the home, if home health RN arranged.  She states that "it shouldn't be an issue but she would have to get approval"  She is to notify TOC with decision.   Patient was previously set up with [facility] home [facility], but decided against home health PT after discharge.  Patient in agreement for home health RN for infusion.  Referral made to [place] with [facility].  [place] to submit for review and approval, and will notify [facility]     Patient updated on the above and in agreement to plan    Expected Discharge Plan and Services                                               Social Determinants of Health (SDOH) Interventions SDOH Screenings   Food Insecurity: No Food Insecurity (11/11/2023)  Housing: Low Risk  (11/11/2023)  Transportation Needs: No Transportation Needs (11/11/2023)  Utilities: Not At Risk (11/11/2023)  Financial Resource Strain: Low Risk  (07/12/2023)   [facility] from [facility]  Social Connections: Unknown (08/02/2022)   Received from [facility]  Stress: Stress Concern Present (07/20/2022)   Received from [facility], [facility]  Tobacco Use: Low Risk  (11/11/2023)    Readmission Risk Interventions     No data to display

## 2023-11-15 NOTE — Progress Notes (Signed)
[facility]  -  at [facility]   PATIENT NAME: [name]    MR#:  [id number]  DATE OF BIRTH:  Mar 18, 1981  SUBJECTIVE:  no family at bedside.  Bp soft. Asymptomatic Tolerating po diet  VITALS:  Blood pressure 98/64, pulse 90, temperature 97.6 F (36.4 C), resp. rate 18, height 5\' 2"  (1.575 m), weight 42 kg, last menstrual period 06/09/2012, SpO2 99%.  PHYSICAL EXAMINATION:   GENERAL:  42 y.o.-year-old patient with no acute distress. Thin cachectic LUNGS: Normal breath sounds bilaterally CARDIOVASCULAR: S1, S2 normal. No murmur   ABDOMEN: Soft, nontender, nondistended. EXTREMITIES: No  edema b/l.    NEUROLOGIC: nonfocal  patient is alert and awake  LABORATORY PANEL:  CBC Recent Labs  Lab 11/11/23 0326  WBC 4.5  HGB 10.6*  HCT 31.9*  PLT 149*    Chemistries  Recent Labs  Lab 11/10/23 1040 11/10/23 1743 11/13/23 1117 11/14/23 1410  NA 140   < > 138  --   K 2.6*   < > 4.6  --   CL 102   < > 101  --   CO2 26   < > 28  --   GLUCOSE 101*   < > 96  --   BUN 19   < > 24*  --   CREATININE 0.76   < > 0.93  --   CALCIUM 9.2   < > 10.1  --   MG 1.2*   < > 1.5* 2.0  AST 22  --   --   --   ALT 20  --   --   --   ALKPHOS 78  --   --   --   BILITOT 0.6  --   --   --    < > = values in this interval not displayed.    RADIOLOGY:  No results found.  Assessment and Plan   ANDREAS EVANGELISTA is a 42 y.o. 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  pt states that she has been getting around her home with her wheelchair but also crawls around at times.  She had 2 falls recently and did hit her head 1 time, but denies any loss of consciousness.  CT head, chest, abdomen, spine all with no acute findings.  Chest x-ray with  no acute findings.  Knee and shoulder x-rays without acute findings.   Electrolyte disturbance with h/o Gitelman's syndrome --Patient has a history of chronic hypomagnesemia and hypokalemia in the setting of Gitleman's disease.   --She follows with [facility].  -- Per chart review, she seems to have had a lot of difficulty with receiving her magnesium infusions; telephone notes indicate both patient noncompliance and insurance difficulties. - Per last nephrology office visit, restart magnesium 400 mg TID and potassium 20 mEq BID - TOC consultation to assist with arranging home infusions since her recent home health agency close down. -- pharmacy to replete electrolyte   Ground-level fall -Patient states that she fell out of her wheelchair when she had a lower back spasm.  She notes that spasms are infrequent for her.  Full pan scan negative for acute abnormalities.  - Continue home tramadol as needed   Chronic Hypotension --History of chronic hypotension, likely due to protein malnutrition and potential  dysautonomia in the setting of cerebral palsy.  - Continue home midodrine   Cerebral palsy, unspecified (HCC) --Patient has a history of cerebral palsy with spastic quadriparesis.  She gets around her home with a wheelchair, but notes that she does crawl around occasionally as well.   DVT (deep venous thrombosis) (HCC) - Continue home Eliquis   Thrombocytopenia (HCC) -Chronic and stable.  No evidence of bleeding on examination.   Palpitation History of frequent palpitations, previously on metoprolol however discontinued due to hypotension.  Patient states palpitations have been more bothersome lately.   Constipation History of severe constipation with moderate stool burden seen on CT imaging. --SMOG enema per pt request --pt had BM  TOC working to get NEW HH that can do IV infusions at home ( mag and K). Pt is medically at baseline for discharge   Advance Care Planning:    Code Status: Full Code   Consults: None  Family Communication: No family at bedside DVT Prophylaxis :eliquis Level of care: Telemetry Medical Status is: Observation The patient remains OBS appropriate and will d/c before 2 midnights.    TOTAL TIME TAKING CARE OF THIS PATIENT: 35 minutes.  >50% time spent on counselling and coordination of care  Note: This dictation was prepared with Dragon dictation along with smaller phrase technology. Any transcriptional errors that result from this process are unintentional.  Enedina Finner M.D    [facility]   CC: Primary care physician; Dione Housekeeper, MD

## 2023-11-15 NOTE — TOC Progression Note (Signed)
Transition of Care [facility]) - Progression Note    Patient Details  Name: [name] MRN: [id number] Date of Birth: 1981/06/08  Transition of Care [facility]) CM/SW Contact  Chapman Fitch, RN Phone Number: 11/15/2023, 10:11 AM  Clinical Narrative:      Message sent to Pam with [employer] to determine if there has been an update from [facility]      Expected Discharge Plan and Services                                               Social Determinants of Health (SDOH) Interventions SDOH Screenings   Food Insecurity: No Food Insecurity (11/11/2023)  Housing: Low Risk  (11/11/2023)  Transportation Needs: No Transportation Needs (11/11/2023)  Utilities: Not At Risk (11/11/2023)  Financial Resource Strain: Low Risk  (07/12/2023)   Received from [facility]  Social Connections: Unknown (08/02/2022)   Received from [facility]  Stress: Stress Concern Present (07/20/2022)   Received from [facility], [facility]  Tobacco Use: Low Risk  (11/11/2023)    Readmission Risk Interventions     No data to display

## 2023-11-16 DIAGNOSIS — E878 Other disorders of electrolyte and fluid balance, not elsewhere classified: Secondary | ICD-10-CM | POA: Diagnosis not present

## 2023-11-16 LAB — BASIC METABOLIC PANEL
Anion gap: 9 (ref 5–15)
BUN: 22 mg/dL — ABNORMAL HIGH (ref 6–20)
CO2: 30 mmol/L (ref 22–32)
Calcium: 10 mg/dL (ref 8.9–10.3)
Chloride: 101 mmol/L (ref 98–111)
Creatinine, Ser: 0.79 mg/dL (ref 0.44–1.00)
GFR, Estimated: >60 mL/min (ref >60)
Glucose, Bld: 96 mg/dL (ref 70–99)
Potassium: 4.2 mmol/L (ref 3.5–5.1)
Sodium: 140 mmol/L (ref 135–145)

## 2023-11-16 LAB — MAGNESIUM: Magnesium: 1.7 mg/dL (ref 1.7–2.4)

## 2023-11-16 MED ORDER — MAGNESIUM OXIDE -MG SUPPLEMENT 400 (240 MG) MG PO TABS
800.0000 mg | ORAL_TABLET | Freq: Two times a day (BID) | ORAL | Status: DC
  Administered 2023-11-16: 800 mg via ORAL
  Filled 2023-11-16 (×3): qty 2

## 2023-11-16 NOTE — Progress Notes (Signed)
[facility]  - [place] at [facility]   PATIENT NAME: [name]    MR#:  [id number]  DATE OF BIRTH:  [birth year]  SUBJECTIVE:  No complaints. Bm last 24. Tolerating diet. No pain  VITALS:  Blood pressure (!) 87/74, pulse 86, temperature 97.6 F (36.4 C), temperature source Oral, resp. rate 16, height 5\' 2"  (1.575 m), weight 39.6 kg, last menstrual period 06/09/2012, SpO2 93%.  PHYSICAL EXAMINATION:   GENERAL:  42 y.o.-year-old patient with no acute distress. Thin cachectic LUNGS: Normal breath sounds bilaterally CARDIOVASCULAR: S1, S2 normal. No murmur   ABDOMEN: Soft, nontender, nondistended. EXTREMITIES: No  edema b/l.    NEUROLOGIC: nonfocal  patient is alert and awake  LABORATORY PANEL:  CBC Recent Labs  Lab 11/11/23 0326  WBC 4.5  HGB 10.6*  HCT 31.9*  PLT 149*    Chemistries  Recent Labs  Lab 11/10/23 1040 11/10/23 1743 11/16/23 0810  NA 140   < > 140  K 2.6*   < > 4.2  CL 102   < > 101  CO2 26   < > 30  GLUCOSE 101*   < > 96  BUN 19   < > 22*  CREATININE 0.76   < > 0.79  CALCIUM 9.2   < > 10.0  MG 1.2*   < > 1.7  AST 22  --   --   ALT 20  --   --   ALKPHOS 78  --   --   BILITOT 0.6  --   --    < > = values in this interval not displayed.    RADIOLOGY:  No results found.  Assessment and Plan   [name] is a 42 y.o. 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  pt states that she has been getting around her home with her wheelchair but also crawls around at times.  She had 2 falls recently and did hit her head 1 time, but denies any loss of consciousness.  CT head, chest, abdomen, spine all with no acute findings.  Chest x-ray with no acute findings.  Knee and shoulder x-rays without acute  findings.   Electrolyte disturbance with h/o Gitelman's syndrome --Patient has a history of chronic hypomagnesemia and hypokalemia in the setting of Gitleman's disease.   --She follows with [facility].  -- Per chart review, she seems to have had a lot of difficulty with receiving her magnesium infusions; telephone notes indicate both patient noncompliance and insurance difficulties. - Per last nephrology office visit, restart magnesium 400 mg TID and potassium 20 mEq BID - TOC consultation to assist with arranging home infusions since her recent home health agency close down. -- mg and k stable -we are still working on getting home infusion set up   Ground-level fall -Patient states that she fell out of her wheelchair when she had a lower back spasm.  She notes that spasms are infrequent for her.  Full pan scan negative for acute abnormalities.  - Continue home tramadol as needed   Chronic Hypotension --History of chronic hypotension, likely due to protein malnutrition and potential dysautonomia in the setting of cerebral palsy.  - Continue home midodrine   Cerebral palsy, unspecified (HCC) --Patient has a history of cerebral palsy with spastic  quadriparesis.  She gets around her home with a wheelchair, but notes that she does crawl around occasionally as well.   DVT (deep venous thrombosis) (HCC) - Continue home Eliquis   Thrombocytopenia (HCC) -Chronic and stable.  No evidence of bleeding on examination.   Palpitation History of frequent palpitations, previously on metoprolol however discontinued due to hypotension.  Patient states palpitations have been more bothersome lately.   Constipation History of severe constipation with moderate stool burden seen on CT imaging. --SMOG enema per pt request --pt had BM in last 24  TOC working to get NEW HH that can do IV infusions at home ( mag and K). Pt is medically at baseline for discharge   Advance Care Planning:   Code Status:  Full Code   Consults: None  Family Communication: No family at bedside, no answer when mother called DVT Prophylaxis :eliquis Level of care: Telemetry Medical Status is: Observation  Silvano Bilis M.D    [facility]

## 2023-11-16 NOTE — TOC Progression Note (Addendum)
Transition of Care [facility]) - Progression Note    Patient Details  Name: [name] MRN: [id number] Date of Birth: 06/01/1981  [facility] CM/SW Contact  Margarito Liner, LCSW Phone Number: 11/16/2023, 10:38 AM  Clinical Narrative:   [facility] can accept patient's referral. Start of care within 24-48 hours. Waiting on decision from [facility].  1:38 pm: Left voicemail for Katie with [facility] to check status of referral.  3:09 pm: [facility] can accept patient but needs orders for flushes with specific instructions. If she left today, they can deliver the supplies to the home tomorrow. CSW unable to reach home health liaison by phone so sent her a secure email to confirm they would be able to go to the home on Friday.  3:57 pm: [facility] confirmed they can start services on Friday. CSW sent signed order form to [facility] liaison.  4:31 pm: Patient said her previous HHRN was coming to the home to start the infusion and then once it was done after 4 hours, she would return to disconnect it and do the flush. [facility] said they cannot promise that they can do this and they're not sure if any other agency would be able to either. They stated they would have to teach family how to disconnect it and do the flush. Patient is aware and said her mom would not be able to do it. CSW tried calling mom but it is going straight to voicemail. Patient is trying to get in touch with her aunt regarding a ride home. She does not want to use EMS.  Expected Discharge Plan and Services                                               Social Determinants of Health (SDOH) Interventions SDOH Screenings   Food Insecurity: No Food Insecurity (11/11/2023)  Housing: Low Risk  (11/11/2023)  Transportation Needs: No Transportation Needs (11/11/2023)  Utilities: Not At Risk (11/11/2023)  Financial Resource Strain: Low Risk  (07/12/2023)   Received from [facility]   Social Connections: Unknown (08/02/2022)   Received from [facility], [facility]  Stress: Stress Concern Present (07/20/2022)   Received from [facility], [facility]  Tobacco Use: Low Risk  (11/11/2023)    Readmission Risk Interventions     No data to display

## 2023-11-17 DIAGNOSIS — E878 Other disorders of electrolyte and fluid balance, not elsewhere classified: Secondary | ICD-10-CM | POA: Diagnosis not present

## 2023-11-17 LAB — BASIC METABOLIC PANEL
Anion gap: 10 (ref 5–15)
BUN: 23 mg/dL — ABNORMAL HIGH (ref 6–20)
CO2: 29 mmol/L (ref 22–32)
Calcium: 10 mg/dL (ref 8.9–10.3)
Chloride: 99 mmol/L (ref 98–111)
Creatinine, Ser: 0.85 mg/dL (ref 0.44–1.00)
GFR, Estimated: >60 mL/min (ref >60)
Glucose, Bld: 90 mg/dL (ref 70–99)
Potassium: 4.3 mmol/L (ref 3.5–5.1)
Sodium: 138 mmol/L (ref 135–145)

## 2023-11-17 LAB — MAGNESIUM: Magnesium: 1.5 mg/dL — ABNORMAL LOW (ref 1.7–2.4)

## 2023-11-17 MED ORDER — MAGNESIUM SULFATE 4 GM/100ML IV SOLN
4.0000 g | Freq: Once | INTRAVENOUS | Status: AC
  Administered 2023-11-17: 4 g via INTRAVENOUS
  Filled 2023-11-17: qty 100

## 2023-11-17 NOTE — Plan of Care (Signed)

## 2023-11-17 NOTE — Discharge Summary (Signed)
[name]:[id number] DOB: 20-Apr-1981 DOA: 11/10/2023  PCP: Dione Housekeeper, MD  Admit date: 11/10/2023 Discharge date: 11/17/2023  Time spent: 35 minutes  Recommendations for Outpatient Follow-up:  Pcp and nephrology f/u     Discharge Diagnoses:  Principal Problem:   Electrolyte disturbance Active Problems:   Ground-level fall   Constipation   Palpitation   Thrombocytopenia (HCC)   DVT (deep venous thrombosis) (HCC)   Cerebral palsy, unspecified (HCC)   Hypotension   Discharge Condition: stable  Diet recommendation: heart healthy  Filed Weights   11/10/23 1049 11/16/23 0500 11/17/23 0500  Weight: 42 kg 39.6 kg 39.1 kg    History of present illness:  From admission h and p [name] is a 42 y.o. 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   [name] states that she has been getting around her home with her wheelchair but also crawls around at times.  She had 2 falls recently and did hit her head 1 time, but denies any loss of consciousness.  She states the most recent fall was due to a lower back spasm.  When she is unable to help herself up, her mother is also not able to do so.  She endorses left shoulder pain and lower back pain.  She also endorses worsening palpitations over the last few weeks but denies any chest pain at this time.  She endorses constipation.  Hospital Course:  Patient with history CP and Gitelman syndrome presents with hypokalemia and hypomagnesemia. These have been managed by her outpatient nephrologist with regular infusions of magnesium, but her home health agency recently closed and an alternative has not been initiated. Patient's electrolyte disturbances were successfully treated here, and her  discharge was delayed as we attempted to arrange for outpatient magnesium infusions (she received 4 g IV twice a week). After prodigious effort by the [facility] a home infusion company and home health agency were located to perform the infusions but no home health agency could be found that would return to de-access the patient's port (would need someone at home to do that). Patient's mother refused and patient could not find anyone else who could do that. Patient had initially declined to go to an outpatient infusion center for these infusions but she she now expresses she is willing to do that, and that has been arranged for the start of next week, so plan will be to start outpatient infusions of magnesium, and close follow-up with nephrology and PCP. Patient's other chronic problems stable, her constipation is resolved.   Procedures: none   Consultations: none  Discharge Exam: Vitals:   11/17/23 0749 11/17/23 1337  BP: (!) 79/55 (!) 147/83  Pulse: 98   Resp: 16   Temp: 98.3 F (36.8 C)   SpO2: 98%     General: chronically ill Cardiovascular: rrr Respiratory: ctab Ext: muscle wasting, spasticity  Discharge Instructions   Discharge Instructions     Diet - low sodium heart healthy   Complete by: As directed    Increase activity slowly   Complete by: As directed       Allergies as of 11/17/2023       Reactions   Duloxetine    Other reaction(s): Other (See Comments), Palpitations Other reaction(s): Hallucinations, Hyperactive behavior (finding), Other (See  Comments) Stressed out   Flecainide    Other reaction(s): Other (See Comments) Other reaction(s): Hypomagnesemia (disorder), Other (See Comments) Other Reaction: QUESTION HYPOMAGNESEMIA   Fluoxetine    Other reaction(s): Nausea And Vomiting, Other (See Comments) Other reaction(s): Other (See Comments) Other Reaction: INCREASED HR  causes irritability and irrational behavior "I go crazy"   Gabapentin    Goes crazy Other  reaction(s): Other (See Comments) Other reaction(s): Hyperactive behavior (finding), Mental Status Changes (intolerance), Other (See Comments) Goes crazy Hallucinations   Baclofen    Other reaction(s): Other (See Comments), Other (See Comments) Weakness  Other reaction(s): Other (See Comments) Weakness and loose Weakness  Other reaction(s):  Weakness    Cephalosporins    Other reaction(s): Hives, Rash Received pre-op vancomycin and cefazolin 07/20/16. Developed urticarial rash on bilateral lower extremities + slight periorbital swelling. Treated with diphenhydramine. Received pre-op vancomycin and cefazolin 07/20/16. Developed urticarial rash on bilateral lower extremities + slight periorbital swelling. Treated with diphenhydramine. Received pre-op vancomycin and cefazolin 07/20/16. Developed urticarial rash on bilateral lower extremities + slight periorbital swelling. Treated with diphenhydramine.   Sulfasalazine    Other reaction(s): Nausea And Vomiting   Sulfa Antibiotics Nausea And Vomiting   Vancomycin    Other reaction(s): Hives Received pre-op vancomycin and cefazolin 07/20/16. Developed urticarial rash on bilateral lower extremities + slight periorbital swelling. Treated with diphenhydramine. Received pre-op vancomycin and cefazolin 07/20/16. Developed urticarial rash on bilateral lower extremities + slight periorbital swelling. Treated with diphenhydramine. Received pre-op vancomycin and cefazolin 07/20/16. Developed urticarial rash on bilateral lower extremities + slight periorbital swelling. Treated with diphenhydramine.   Cefazolin Rash   Tolerated ceftriaxone 2023 and cephalexin 2022        Medication List     STOP taking these medications    aMILoride 5 MG tablet Commonly known as: MIDAMOR   lubiprostone 24 MCG capsule Commonly known as: AMITIZA   methocarbamol 500 MG tablet Commonly known as: ROBAXIN   Motegrity 2 MG Tabs Generic drug: Prucalopride Succinate    Vitamin D (Ergocalciferol) 1.25 MG (50000 UNIT) Caps capsule Commonly known as: DRISDOL       TAKE these medications    acetaminophen 325 MG tablet Commonly known as: TYLENOL Take 2 tablets (650 mg total) by mouth every 6 (six) hours as needed for mild pain (pain score 1-3) or fever.   bisacodyl 10 MG suppository Commonly known as: DULCOLAX Place 1 suppository (10 mg total) rectally daily as needed for severe constipation.   bisacodyl 5 MG EC tablet Commonly known as: DULCOLAX Take 2 tablets (10 mg total) by mouth 2 (two) times daily.   cyanocobalamin 1000 MCG tablet Commonly known as: VITAMIN B12 Take 1,000 mcg by mouth daily.   D2000 Ultra Strength 50 MCG (2000 UT) Caps Generic drug: Cholecalciferol Take 1 tablet by mouth daily.   diclofenac Sodium 1 % Gel Commonly known as: VOLTAREN Apply 2 g topically 4 (four) times daily.   Eliquis 5 MG Tabs tablet Generic drug: apixaban Take 5 mg by mouth 2 (two) times daily.   famotidine 40 MG tablet Commonly known as: PEPCID Take 1 tablet (40 mg total) by mouth at bedtime as needed for heartburn.   heparin lock flush 100 UNIT/ML Soln injection Inject 5 mL (500 Units total) as directed as needed for line care. Flush using SASH (saline, administer  IV, saline, heparin) method as directed. Flush IV catheter with heparin after the last saline flush.  Use syringe ONE TIME only then discard. Storage: Room  Temperature.   Linzess 290 MCG Caps capsule Generic drug: linaclotide Take 290 mcg by mouth daily.   magnesium 84 MG ( ) Tbcr SR tablet Commonly known as: MAGTAB Take 936 mg by mouth 3 (three) times daily.   magnesium chloride 200 MG/ML Soln Inject 20 mLs (4,000 mg total) as directed 2 (two) times a week. 4gm MgSulfate on 1000 ml LR per home infusion   metoCLOPramide 5 MG tablet Commonly known as: REGLAN Take 5 mg by mouth 4 (four) times daily.   midodrine 10 MG tablet Commonly known as: PROAMATINE Take 1 tablet  (10 mg total) by mouth 3 (three) times daily with meals. Hold if Systolic BP >110   montelukast 10 MG tablet Commonly known as: SINGULAIR Take 10 mg by mouth daily as needed (allergies).   pantoprazole 40 MG tablet Commonly known as: PROTONIX Take 40 mg by mouth daily.   polyethylene glycol 17 g packet Commonly known as: MIRALAX / GLYCOLAX Take 17 g by mouth 2 (two) times daily.   potassium chloride SA 20 MEQ tablet Commonly known as: KLOR-CON M Take 20 mEq by mouth 2 (two) times daily.   pregabalin 150 MG capsule Commonly known as: LYRICA Take 150 mg by mouth in the morning, at noon, and at bedtime.   Premarin vaginal cream Generic drug: conjugated estrogens Place 0.5 g vaginally 2 (two) times a week.   Ritalin 20 MG tablet Generic drug: methylphenidate Take 20 mg by mouth 3 (three) times daily. Take 1 tablet (20 mg) TID at 0800, 1200 & 2100-2200   sucralfate 1 g tablet Commonly known as: CARAFATE Take 1 g by mouth 4 (four) times daily.   traMADol 50 MG tablet Commonly known as: ULTRAM Take 50 mg by mouth every 6 (six) hours as needed for moderate pain.       Allergies  Allergen Reactions   Duloxetine     Other reaction(s): Other (See Comments), Palpitations Other reaction(s): Hallucinations, Hyperactive behavior (finding), Other (See Comments) Stressed out     Flecainide     Other reaction(s): Other (See Comments) Other reaction(s): Hypomagnesemia (disorder), Other (See Comments) Other Reaction: QUESTION HYPOMAGNESEMIA     Fluoxetine     Other reaction(s): Nausea And Vomiting, Other (See Comments) Other reaction(s): Other (See Comments) Other Reaction: INCREASED HR  causes irritability and irrational behavior "I go crazy"   Gabapentin     Goes crazy Other reaction(s): Other (See Comments) Other reaction(s): Hyperactive behavior (finding), Mental Status Changes (intolerance), Other (See Comments) Goes crazy Hallucinations   Baclofen     Other  reaction(s): Other (See Comments), Other (See Comments) Weakness  Other reaction(s): Other (See Comments) Weakness and loose Weakness  Other reaction(s):  Weakness     Cephalosporins     Other reaction(s): Hives, Rash Received pre-op vancomycin and cefazolin 07/20/16. Developed urticarial rash on bilateral lower extremities + slight periorbital swelling. Treated with diphenhydramine. Received pre-op vancomycin and cefazolin 07/20/16. Developed urticarial rash on bilateral lower extremities + slight periorbital swelling. Treated with diphenhydramine. Received pre-op vancomycin and cefazolin 07/20/16. Developed urticarial rash on bilateral lower extremities + slight periorbital swelling. Treated with diphenhydramine.   Sulfasalazine     Other reaction(s): Nausea And Vomiting   Sulfa Antibiotics Nausea And Vomiting   Vancomycin     Other reaction(s): Hives Received pre-op vancomycin and cefazolin 07/20/16. Developed urticarial rash on bilateral lower extremities + slight periorbital swelling. Treated with diphenhydramine. Received pre-op vancomycin and cefazolin 07/20/16. Developed urticarial rash on bilateral lower extremities +  slight periorbital swelling. Treated with diphenhydramine. Received pre-op vancomycin and cefazolin 07/20/16. Developed urticarial rash on bilateral lower extremities + slight periorbital swelling. Treated with diphenhydramine.   Cefazolin Rash    Tolerated ceftriaxone 2023 and cephalexin 2022    Follow-up Information     [facility] Follow up.   Specialty: Home Health Services Why: They will follow up with you for your home health nursing needs. Contact information: [address] [place] [address] [phone]         Dione Housekeeper, MD Follow up.   Specialty: Family Medicine Contact information: [address] [place] [address] [phone]         Your [facility] nephrologist Follow up.                   The  results of significant diagnostics from this hospitalization (including imaging, microbiology, ancillary and laboratory) are listed below for reference.    Significant Diagnostic Studies: CT CHEST ABDOMEN PELVIS W CONTRAST  Result Date: 11/10/2023 CLINICAL DATA:  Polytrauma, blunt EXAM: CT CHEST, ABDOMEN, AND PELVIS WITH CONTRAST TECHNIQUE: Multidetector CT imaging of the chest, abdomen and pelvis was performed following the standard protocol during bolus administration of intravenous contrast. RADIATION DOSE REDUCTION: This exam was performed according to the departmental dose-optimization program which includes automated exposure control, adjustment of the mA and/or kV according to patient size and/or use of iterative reconstruction technique. CONTRAST:  60mL OMNIPAQUE IOHEXOL 300 MG/ML  SOLN COMPARISON:  09/22/2023, 04/30/2023 FINDINGS: CT CHEST FINDINGS Cardiovascular: Right IJ chest port hand left subclavian approach pacemaker remain in place. Epicardial leads are also present. Heart size is normal. No pericardial effusion. Thoracic aorta is normal in course and caliber. Central pulmonary vasculature is within normal limits. Mediastinum/Nodes: No enlarged mediastinal, hilar, or axillary lymph nodes. Thyroid gland, trachea, and esophagus demonstrate no significant findings. Lungs/Pleura: Unchanged pleuroparenchymal scarring at the periphery of the left upper lobe. Lungs are otherwise clear. No pleural effusion or pneumothorax. Musculoskeletal: No acute osseous abnormality. No chest wall hematoma. CT ABDOMEN PELVIS FINDINGS Hepatobiliary: No focal liver abnormality is seen. Status post cholecystectomy. No biliary dilatation. Pancreas: No acute findings. Pancreatic body and tail are not well seen. Spleen: Normal in size without focal abnormality. Adrenals/Urinary Tract: Adrenal glands are unremarkable. Kidneys are normal, without renal calculi, solid lesion, or hydronephrosis. Bladder is unremarkable.  Stomach/Bowel: Stomach is within normal limits. Moderate-large volume of stool throughout the colon. No evidence of bowel wall thickening, distention, or inflammatory changes. Vascular/Lymphatic: No significant vascular findings are present. No enlarged abdominal or pelvic lymph nodes. Reproductive: Status post hysterectomy. No adnexal masses. Other: No free fluid. No abdominopelvic fluid collection. No pneumoperitoneum. No abdominal wall hernia. Musculoskeletal: No acute or significant osseous findings. IMPRESSION: 1. No acute findings within the chest, abdomen, or pelvis. 2. Moderate-large volume of stool throughout the colon. Electronically Signed   By: Duanne Guess D.O.   On: 11/10/2023 13:44   CT T-SPINE NO CHARGE  Result Date: 11/10/2023 CLINICAL DATA:  Mechanical fall yesterday.  Pain. EXAM: CT THORACIC SPINE WITHOUT CONTRAST TECHNIQUE: Multidetector CT images of the thoracic were obtained using the standard protocol without intravenous contrast. RADIATION DOSE REDUCTION: This exam was performed according to the departmental dose-optimization program which includes automated exposure control, adjustment of the mA and/or kV according to patient size and/or use of iterative reconstruction technique. COMPARISON:  CT angio chest 04/30/2023. Thoracic spine radiographs 07/30/2014. FINDINGS: Alignment: No significant listhesis is present. Straightening  of the upper thoracic kyphosis is stable. Exaggerated lower thoracic kyphosis is also stable. Vertebrae: Vertebral body heights are normal. No acute fractures are present. Mild endplate degenerative changes are noted in the lower thoracic spine. No focal osseous lesions are present. Paraspinal and other soft tissues: The paraspinous soft tissues are within normal limits. The visualized lung fields are clear. A right IJ line is in satisfactory position. Defibrillator in pacing wires enter via a left subclavian approach. Disc levels: A left paramedian calcified  disc protrusion is present at T6-7 without significant stenosis. Mild calcified disc is also present at T2-3 without significant stenosis. The foramina are patent bilaterally. IMPRESSION: 1. No acute fracture or traumatic subluxation. 2. Mild degenerative changes of the thoracic spine as described. Electronically Signed   By: Marin Roberts [name]   On: 11/10/2023 13:41   CT L-SPINE NO CHARGE  Result Date: 11/10/2023 CLINICAL DATA:  Mechanical fall yesterday.  Pain. EXAM: CT LUMBAR SPINE WITHOUT CONTRAST TECHNIQUE: Multidetector CT imaging of the lumbar spine was performed without intravenous contrast administration. Multiplanar CT image reconstructions were also generated. RADIATION DOSE REDUCTION: This exam was performed according to the departmental dose-optimization program which includes automated exposure control, adjustment of the mA and/or kV according to patient size and/or use of iterative reconstruction technique. COMPARISON:  CT of the abdomen and pelvis 09/22/2023 FINDINGS: Segmentation: 5 non rib-bearing lumbar type vertebral bodies are present. The lowest fully formed vertebral body is L5. Alignment: Slight degenerative retrolisthesis at L1-2, L2-3 and L3-4 is stable. Lumbar lordosis is preserved. Vertebrae: Endplate degenerative changes are present at T12-L1 and L1-2. Vertebral body heights are maintained. No acute fractures are present. Paraspinal and other soft tissues: Atherosclerotic calcifications are present aorta and branch vessels. No aneurysm is present. Paraspinous soft tissues are otherwise within normal limits. Disc levels: T12-L1: A left paramedian calcified disc protrusion is present without significant stenosis. L1-2: A broad-based calcified disc protrusion is present. No focal stenosis is present. L2-3: Negative. L3-4: A broad-based disc protrusion is present. No focal stenosis is present. L4-5: A broad-based disc protrusion is present. Mild facet hypertrophy and ligamentum  flavum thickening is noted. Moderate subarticular stenosis is worse right than left. Mild to moderate foraminal stenosis is present bilaterally. L5-S1: Moderate facet hypertrophy is worse on the right. No focal disc protrusion or stenosis is present. IMPRESSION: 1. No acute fracture or traumatic subluxation. 2. Multilevel degenerative changes of the lumbar spine as described. 3. Moderate subarticular stenosis at L4-5 is worse right than left. 4. Mild to moderate foraminal stenosis bilaterally at L4-5. 5. Moderate facet hypertrophy at L5-S1 is worse on the right. 6.  Aortic Atherosclerosis (ICD10-I70.0). Electronically Signed   By: Marin Roberts [name]   On: 11/10/2023 13:36   CT Cervical Spine Wo Contrast  Result Date: 11/10/2023 CLINICAL DATA:  Mechanical fall yesterday.  Ataxia. EXAM: CT CERVICAL SPINE WITHOUT CONTRAST TECHNIQUE: Multidetector CT imaging of the cervical spine was performed without intravenous contrast. Multiplanar CT image reconstructions were also generated. RADIATION DOSE REDUCTION: This exam was performed according to the departmental dose-optimization program which includes automated exposure control, adjustment of the mA and/or kV according to patient size and/or use of iterative reconstruction technique. COMPARISON:  None Available. FINDINGS: Alignment: Slight retrolisthesis present at C5-6. Slight degenerative retrolisthesis is present at C5-6. Alignment is otherwise anatomic. Slight reversal of the normal cervical lordosis is present. Skull base and vertebrae: A remote type 1 dens fracture is well corticated. No acute fractures are present. No focal osseous  lesions are present. Soft tissues and spinal canal: No prevertebral fluid or swelling. No visible canal hematoma. The soft tissues the neck are otherwise unremarkable. Disc levels: Uncovertebral spurring at a rightward disc osteophyte complex are present at C3-4 with partial effacement of ventral CSF and moderate right  foraminal stenosis. Uncovertebral spurring contributes to mild foraminal narrowing, left greater than right at C5-6. A broad-based disc osteophyte complex partially effaces the ventral CSF and results in moderate foraminal stenosis bilaterally at C6-7. Upper chest: The lung apices are clear. The thoracic inlet is within normal limits. A right IJ catheter is in place. IMPRESSION: 1. No acute fracture or traumatic subluxation. 2. Remote type 1 dens fracture. 3. Multilevel degenerative changes of the cervical spine as described. Electronically Signed   By: Marin Roberts [name]   On: 11/10/2023 13:32   CT HEAD WO CONTRAST ( )  Result Date: 11/10/2023 CLINICAL DATA:  Mechanical fall yesterday. EXAM: CT HEAD WITHOUT CONTRAST TECHNIQUE: Contiguous axial images were obtained from the base of the skull through the vertex without intravenous contrast. RADIATION DOSE REDUCTION: This exam was performed according to the departmental dose-optimization program which includes automated exposure control, adjustment of the mA and/or kV according to patient size and/or use of iterative reconstruction technique. COMPARISON:  CT head without contrast 05/29/2023 FINDINGS: Brain: No acute infarct, hemorrhage, or mass lesion is present. 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 Basal ganglia are otherwise within normal limits. No significant extraaxial fluid collection is present. The ventricles are of normal size. The brainstem and cerebellum are within normal limits. Midline structures are within normal limits. Vascular: No hyperdense vessel or unexpected calcification. Skull: Calvarium is intact. No focal lytic or blastic lesions are present. No significant extracranial soft tissue lesion is present. Sinuses/Orbits: The paranasal sinuses and mastoid air cells are clear. The globes and orbits are within normal limits. IMPRESSION: 1. No acute intracranial abnormality or  significant interval change. 2. 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 Electronically Signed   By: Marin Roberts [name]   On: 11/10/2023 13:28   DG Knee 2 Views Left  Result Date: 11/10/2023 CLINICAL DATA:  Fall, pain EXAM: LEFT KNEE - 1-2 VIEW COMPARISON:  05/29/2023 FINDINGS: No evidence of fracture, dislocation, or joint effusion. Mild medial compartment joint space narrowing. Chondrocalcinosis. Prominent soft tissue swelling anterior to the tibial tuberosity and within the prepatellar region. IMPRESSION: Prominent soft tissue swelling anterior to the tibial tuberosity and within the prepatellar region. No acute fracture or dislocation. Electronically Signed   By: Duanne Guess D.O.   On: 11/10/2023 12:31   DG Chest 2 View  Result Date: 11/10/2023 CLINICAL DATA:  Fall EXAM: CHEST - 2 VIEW COMPARISON:  10/16/2023 FINDINGS: Right IJ chest port remains in place. Left subclavian approach pacemaker and epicardial pacemaker are unchanged in positioning. Normal heart size. No focal airspace consolidation, pleural effusion, or pneumothorax. IMPRESSION: No active cardiopulmonary disease. Electronically Signed   By: Duanne Guess D.O.   On: 11/10/2023 11:42   DG Shoulder Left  Result Date: 11/10/2023 CLINICAL DATA:  Fall, left shoulder pain EXAM: LEFT SHOULDER - 2+ VIEW COMPARISON:  None Available. FINDINGS: There is no evidence of fracture or dislocation. Minimal degenerative changes of the acromioclavicular and glenohumeral joints. Soft tissues are unremarkable. IMPRESSION: No acute fracture or dislocation of the left shoulder. Electronically Signed   By: Duanne Guess D.O.   On: 11/10/2023 11:41    Microbiology: No results found  for this or any previous visit (from the past 240 hour(s)).   Labs: Basic Metabolic Panel: Recent Labs  Lab 11/10/23 1743 11/11/23 0320 11/11/23 2135 11/13/23 1117 11/14/23 1410 11/16/23 0810  11/17/23 0549  NA 140 138  --  138  --  140 138  K 3.7 3.5  --  4.6  --  4.2 4.3  CL 104 105  --  101  --  101 99  CO2 27 24  --  28  --  30 29  GLUCOSE 148* 107*  --  96  --  96 90  BUN 16 14  --  24*  --  22* 23*  CREATININE 0.75 0.73  --  0.93  --  0.79 0.85  CALCIUM 8.7* 8.9  --  10.1  --  10.0 10.0  MG 1.8 1.4* 2.6* 1.5* 2.0 1.7 1.5*   Liver Function Tests: No results for input(s): "AST", "ALT", "ALKPHOS", "BILITOT", "PROT", "ALBUMIN" in the last 168 hours. No results for input(s): "LIPASE", "AMYLASE" in the last 168 hours. No results for input(s): "AMMONIA" in the last 168 hours. CBC: Recent Labs  Lab 11/11/23 0326  WBC 4.5  NEUTROABS 2.3  HGB 10.6*  HCT 31.9*  MCV 77.4*  PLT 149*   Cardiac Enzymes: No results for input(s): "CKTOTAL", "CKMB", "CKMBINDEX", "TROPONINI" in the last 168 hours. BNP: BNP (last 3 results) No results for input(s): "BNP" in the last 8760 hours.  ProBNP (last 3 results) No results for input(s): "PROBNP" in the last 8760 hours.  CBG: No results for input(s): "GLUCAP" in the last 168 hours.     Signed:  Silvano Bilis MD.  [facility] 11/17/2023, 2:36 PM

## 2023-11-17 NOTE — Progress Notes (Signed)
[name] to be D/C'd Home per MD order.  Discussed prescriptions and follow up appointments including appointment at infusion center on Tuesday.  Pt verbalized understanding.  Allergies as of 11/17/2023       Reactions   Duloxetine    Other reaction(s): Other (See Comments), Palpitations Other reaction(s): Hallucinations, Hyperactive behavior (finding), Other (See Comments) Stressed out   Flecainide    Other reaction(s): Other (See Comments) Other reaction(s): Hypomagnesemia (disorder), Other (See Comments) Other Reaction: QUESTION HYPOMAGNESEMIA   Fluoxetine    Other reaction(s): Nausea And Vomiting, Other (See Comments) Other reaction(s): Other (See Comments) Other Reaction: INCREASED HR  causes irritability and irrational behavior "I go crazy"   Gabapentin    Goes crazy Other reaction(s): Other (See Comments) Other reaction(s): Hyperactive behavior (finding), Mental Status Changes (intolerance), Other (See Comments) Goes crazy Hallucinations   Baclofen    Other reaction(s): Other (See Comments), Other (See Comments) Weakness  Other reaction(s): Other (See Comments) Weakness and loose Weakness  Other reaction(s):  Weakness    Cephalosporins    Other reaction(s): Hives, Rash Received pre-op vancomycin and cefazolin 07/20/16. Developed urticarial rash on bilateral lower extremities + slight periorbital swelling. Treated with diphenhydramine. Received pre-op vancomycin and cefazolin 07/20/16. Developed urticarial rash on bilateral lower extremities + slight periorbital swelling. Treated with diphenhydramine. Received pre-op vancomycin and cefazolin 07/20/16. Developed urticarial rash on bilateral lower extremities + slight periorbital swelling. Treated with diphenhydramine.   Sulfasalazine    Other reaction(s): Nausea And Vomiting   Sulfa Antibiotics Nausea And Vomiting   Vancomycin    Other reaction(s): Hives Received pre-op vancomycin and cefazolin 07/20/16. Developed  urticarial rash on bilateral lower extremities + slight periorbital swelling. Treated with diphenhydramine. Received pre-op vancomycin and cefazolin 07/20/16. Developed urticarial rash on bilateral lower extremities + slight periorbital swelling. Treated with diphenhydramine. Received pre-op vancomycin and cefazolin 07/20/16. Developed urticarial rash on bilateral lower extremities + slight periorbital swelling. Treated with diphenhydramine.   Cefazolin Rash   Tolerated ceftriaxone 2023 and cephalexin 2022        Medication List     STOP taking these medications    aMILoride 5 MG tablet Commonly known as: MIDAMOR   lubiprostone 24 MCG capsule Commonly known as: AMITIZA   methocarbamol 500 MG tablet Commonly known as: ROBAXIN   Motegrity 2 MG Tabs Generic drug: Prucalopride Succinate   Vitamin D (Ergocalciferol) 1.25 MG (50000 UNIT) Caps capsule Commonly known as: DRISDOL       TAKE these medications    acetaminophen 325 MG tablet Commonly known as: TYLENOL Take 2 tablets (650 mg total) by mouth every 6 (six) hours as needed for mild pain (pain score 1-3) or fever.   bisacodyl 10 MG suppository Commonly known as: DULCOLAX Place 1 suppository (10 mg total) rectally daily as needed for severe constipation.   bisacodyl 5 MG EC tablet Commonly known as: DULCOLAX Take 2 tablets (10 mg total) by mouth 2 (two) times daily.   cyanocobalamin 1000 MCG tablet Commonly known as: VITAMIN B12 Take 1,000 mcg by mouth daily.   D2000 Ultra Strength 50 MCG (2000 UT) Caps Generic drug: Cholecalciferol Take 1 tablet by mouth daily.   diclofenac Sodium 1 % Gel Commonly known as: VOLTAREN Apply 2 g topically 4 (four) times daily.   Eliquis 5 MG Tabs tablet Generic drug: apixaban Take 5 mg by mouth 2 (two) times daily.   famotidine 40 MG tablet Commonly known as: PEPCID Take 1 tablet (40 mg total) by mouth at  bedtime as needed for heartburn.   heparin lock flush 100 UNIT/ML Soln  injection Inject 5 mL (500 Units total) as directed as needed for line care. Flush using SASH (saline, administer  IV, saline, heparin) method as directed. Flush IV catheter with heparin after the last saline flush.  Use syringe ONE TIME only then discard. Storage: Room Temperature.   Linzess 290 MCG Caps capsule Generic drug: linaclotide Take 290 mcg by mouth daily.   magnesium 84 MG ( ) Tbcr SR tablet Commonly known as: MAGTAB Take 936 mg by mouth 3 (three) times daily.   magnesium chloride 200 MG/ML Soln Inject 20 mLs (4,000 mg total) as directed 2 (two) times a week. 4gm MgSulfate on 1000 ml LR per home infusion   metoCLOPramide 5 MG tablet Commonly known as: REGLAN Take 5 mg by mouth 4 (four) times daily.   midodrine 10 MG tablet Commonly known as: PROAMATINE Take 1 tablet (10 mg total) by mouth 3 (three) times daily with meals. Hold if Systolic BP >110   montelukast 10 MG tablet Commonly known as: SINGULAIR Take 10 mg by mouth daily as needed (allergies).   pantoprazole 40 MG tablet Commonly known as: PROTONIX Take 40 mg by mouth daily.   polyethylene glycol 17 g packet Commonly known as: MIRALAX / GLYCOLAX Take 17 g by mouth 2 (two) times daily.   potassium chloride SA 20 MEQ tablet Commonly known as: KLOR-CON M Take 20 mEq by mouth 2 (two) times daily.   pregabalin 150 MG capsule Commonly known as: LYRICA Take 150 mg by mouth in the morning, at noon, and at bedtime.   Premarin vaginal cream Generic drug: conjugated estrogens Place 0.5 g vaginally 2 (two) times a week.   Ritalin 20 MG tablet Generic drug: methylphenidate Take 20 mg by mouth 3 (three) times daily. Take 1 tablet (20 mg) TID at 0800, 1200 & 2100-2200   sucralfate 1 g tablet Commonly known as: CARAFATE Take 1 g by mouth 4 (four) times daily.   traMADol 50 MG tablet Commonly known as: ULTRAM Take 50 mg by mouth every 6 (six) hours as needed for moderate pain.        Vitals:    11/17/23 1337 11/17/23 1555  BP: (!) 147/83 106/79  Pulse:  90  Resp:  16  Temp:  98.7 F (37.1 C)  SpO2:  97%    Skin clean, dry and intact without evidence of skin break down, no evidence of skin tears noted. IV catheter discontinued intact. Site without signs and symptoms of complications. Dressing and pressure applied. Pt denies pain at this time. No complaints noted.  An After Visit Summary was printed and given to the patient. Patient transferred to stretcher and taken out via EMS.  Liboria Putnam C. Jilda Roche

## 2023-11-17 NOTE — TOC Transition Note (Signed)
Transition of Care [facility]) - CM/SW Discharge Note   Patient Details  Name: [name] MRN: [id number] Date of Birth: 1980-12-14  [facility] CM/SW Contact:  Chapman Fitch, RN Phone Number: 11/17/2023, 5:00 PM   Clinical Narrative:      Patient to discharge today 3 way phone call completed with mother and MD. Mother states that she is not willing to de access patients port.  Mother is aware that patient will not have home health if there is not a teachable care giver  [facility] is only able to accept if there is a teachable caregiver to de access - [facility], [facility], [facility], [facility], [facility], [facility], and [facility] unable to accept  Extensive conversations with patient at bedside.  She attempted to arranged a nurse that was a friend to agree to deaccess her port, however there was not an option  Patient states that she would like to move forward with outpatient infusion at [facility] [place] who already has a referral. [facility] with [facility] confirms appointment will be scheduled.  She states that she will arrange her own transportation through [place], which she has an app on her phone  Appointment scheduled for 12/12 at 830 for 4 hours. Added to AVS and patient aware  Patient was unable to secure discharge transport.  EMS arranged   [facility] updated to dc referral        Patient Goals and CMS Choice      Discharge Placement                         Discharge Plan and Services Additional resources added to the After Visit Summary for                                       Social Determinants of Health (SDOH) Interventions SDOH Screenings   Food Insecurity: No Food Insecurity (11/11/2023)  Housing: Low Risk  (11/11/2023)  Transportation Needs: No Transportation Needs (11/11/2023)  Utilities: Not At Risk (11/11/2023)  Financial Resource Strain: Low Risk  (07/12/2023)   Received from [facility]  Social  Connections: Unknown (08/02/2022)   Received from [facility], [facility]  Stress: Stress Concern Present (07/20/2022)   Received from [facility], [facility]  Tobacco Use: Low Risk  (11/11/2023)     Readmission Risk Interventions     No data to display

## 2023-12-09 ENCOUNTER — Emergency Department: Payer: Medicare HMO

## 2023-12-09 ENCOUNTER — Other Ambulatory Visit: Payer: Self-pay

## 2023-12-09 ENCOUNTER — Emergency Department
Admission: EM | Admit: 2023-12-09 | Discharge: 2023-12-10 | Disposition: A | Discharge status: Home / Self Care | Payer: Medicare HMO | Attending: Emergency Medicine | Admitting: Emergency Medicine

## 2023-12-09 DIAGNOSIS — E876 Hypokalemia: Secondary | ICD-10-CM | POA: Diagnosis not present

## 2023-12-09 DIAGNOSIS — R002 Palpitations: Secondary | ICD-10-CM | POA: Diagnosis present

## 2023-12-09 DIAGNOSIS — Z7901 Long term (current) use of anticoagulants: Secondary | ICD-10-CM | POA: Insufficient documentation

## 2023-12-09 DIAGNOSIS — D649 Anemia, unspecified: Secondary | ICD-10-CM | POA: Diagnosis not present

## 2023-12-09 LAB — BASIC METABOLIC PANEL
Anion gap: 11 (ref 5–15)
BUN: 18 mg/dL (ref 6–20)
CO2: 26 mmol/L (ref 22–32)
Calcium: 9.3 mg/dL (ref 8.9–10.3)
Chloride: 101 mmol/L (ref 98–111)
Creatinine, Ser: 0.7 mg/dL (ref 0.44–1.00)
GFR, Estimated: >60 mL/min (ref >60)
Glucose, Bld: 99 mg/dL (ref 70–99)
Potassium: 2.7 mmol/L — CL (ref 3.5–5.1)
Sodium: 138 mmol/L (ref 135–145)

## 2023-12-09 LAB — CBC
HCT: 34.6 % — ABNORMAL LOW (ref 36.0–46.0)
Hemoglobin: 11.4 g/dL — ABNORMAL LOW (ref 12.0–15.0)
MCH: 25.7 pg — ABNORMAL LOW (ref 26.0–34.0)
MCHC: 32.9 g/dL (ref 30.0–36.0)
MCV: 77.9 fL — ABNORMAL LOW (ref 80.0–100.0)
Platelets: 220 10*3/uL (ref 150–400)
RBC: 4.44 MIL/uL (ref 3.87–5.11)
RDW: 15.2 % (ref 11.5–15.5)
WBC: 5.1 10*3/uL (ref 4.0–10.5)
nRBC: 0 % (ref 0.0–0.2)

## 2023-12-09 LAB — TROPONIN I (HIGH SENSITIVITY): Troponin I (High Sensitivity): <2 ng/L (ref <18)

## 2023-12-09 LAB — MAGNESIUM: Magnesium: 1.2 mg/dL — ABNORMAL LOW (ref 1.7–2.4)

## 2023-12-09 MED ORDER — SODIUM CHLORIDE 0.9 % IV BOLUS
1000.0000 mL | Freq: Once | INTRAVENOUS | Status: AC
Start: 2023-12-09 — End: 2023-12-09
  Administered 2023-12-09: 1000 mL via INTRAVENOUS

## 2023-12-09 MED ORDER — MAGNESIUM SULFATE 4 GM/100ML IV SOLN
4.0000 g | Freq: Once | INTRAVENOUS | Status: AC
  Administered 2023-12-09: 4 g via INTRAVENOUS
  Filled 2023-12-09: qty 100

## 2023-12-09 MED ORDER — POTASSIUM CHLORIDE CRYS ER 20 MEQ PO TBCR
40.0000 meq | EXTENDED_RELEASE_TABLET | Freq: Once | ORAL | Status: AC
  Administered 2023-12-09: 40 meq via ORAL
  Filled 2023-12-09: qty 2

## 2023-12-09 MED ORDER — POTASSIUM CHLORIDE 10 MEQ/100ML IV SOLN
10.0000 meq | INTRAVENOUS | Status: AC
  Administered 2023-12-09 (×2): 10 meq via INTRAVENOUS
  Filled 2023-12-09: qty 100

## 2023-12-09 NOTE — ED Triage Notes (Signed)
Pt to ed from home via [facility] for palpitations. Pt has been weak for days also. Staff is familiar with pt. Pt is caox4, in no acute distress and in recliner in triage.

## 2023-12-09 NOTE — ED Notes (Signed)
First Nurse Note:  BIB from home via [facility]. Weakness x 2 days. Pt advised had HX of low magnesium. Pt feels she has had palpitations and felt like her pacemaker fired today   122/88 87 paced rhythm

## 2023-12-09 NOTE — ED Provider Notes (Signed)
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  Patient reports that for the past few days she has had palpitations with generalized weakness.  Reports this is very similar to prior episodes when she has had hypokalemia.  Does report she has been taking her home medications as directed.  Reports feeling lightheaded earlier today and had a fall and hit her left knee.  Did not hit her head.  Denies LOC.     Physical Exam   Triage Vital Signs: ED Triage Vitals  Encounter Vitals Group     BP 12/09/23 1653 128/84     Systolic BP Percentile --      Diastolic BP Percentile --      Pulse Rate 12/09/23 1653 86     Resp 12/09/23 1653 16     Temp 12/09/23 1653 98.2 F (36.8 C)     Temp Source 12/09/23 1653 Oral     SpO2 12/09/23 1653 100 %     Weight --      Height --      Head Circumference --      Peak Flow --      Pain Score 12/09/23 1701 0     Pain Loc --      Pain Education --      Exclude from Growth Chart --     Most recent vital signs: Vitals:   12/09/23 2229 12/09/23 2230  BP: 103/72 107/72  Pulse: 85 85  Resp: 18 19  Temp:    SpO2: 100%      General: Awake, interactive  CV:  Regular rate, good peripheral perfusion.  Resp:  Unlabored respirations, lungs clear to auscultation Abd:  Nondistended, soft, nontender MSK:  Mild tenderness palpation over the left anterior knee with some soft tissue swelling along the tibia, but able to range without significant discomfort    ED Results / Procedures / Treatments   Labs (all labs ordered are listed, but only abnormal results are  displayed) Labs Reviewed  BASIC METABOLIC PANEL - Abnormal; Notable for the following components:      Result Value   Potassium 2.7 (*)    All other components within normal limits  CBC - Abnormal; Notable for the following components:   Hemoglobin 11.4 (*)    HCT 34.6 (*)    MCV 77.9 (*)    MCH 25.7 (*)    All other components within normal limits  MAGNESIUM - Abnormal; Notable for the following components:   Magnesium 1.2 (*)    All other components within normal limits  TROPONIN I (HIGH SENSITIVITY)     EKG EKG independently reviewed interpreted by myself (ER attending) demonstrates:  EKG demonstrates paced rhythm and rate of 87, PR 124, QRS 96, QTc 450, no acute ST changes  RADIOLOGY Imaging independently reviewed and interpreted by myself demonstrates:  CXR without focal consolidation Knee x-Laporche Martelle without acute fracture  PROCEDURES:  Critical Care performed: No  Procedures   MEDICATIONS ORDERED IN ED: Medications  sodium chloride 0.9 % bolus 1,000 mL (0 mLs Intravenous Stopped 12/09/23 2253)  potassium chloride SA (KLOR-CON M) CR tablet  40 mEq (40 mEq Oral Given 12/09/23 2032)  potassium chloride 10 mEq in 100 mL IVPB (0 mEq Intravenous Stopped 12/09/23 2228)  magnesium sulfate IVPB 4 g 100 mL (4 g Intravenous New Bag/Given 12/09/23 2121)     IMPRESSION / MDM / ASSESSMENT AND PLAN / ED COURSE  I reviewed the triage vital signs and the nursing notes.  Differential diagnosis includes, but is not limited to, hypokalemia, hypomagnesemia, other electrolyte abnormality, arrhythmia, anemia, extremity injury, no history or physical exam findings suggestive of head or thoracoabdominal trauma  Patient's presentation is most consistent with acute presentation with potential threat to life or bodily function.  42 year old female presenting to the emergency department for evaluation of palpitations.  Vital stable on presentation.  Lab work does demonstrate hypokalemia, known  history of similar.  Magnesium added on, also low at 1.2.  Stable anemia.  Suspect that this is the etiology of patient's palpitations given multiple similar episodes.  I did discuss trialing electrolyte repletion in the ER and potential discharge if patient feels improved which patient is amenable.  She was ordered for oral and IV potassium repletion as well as IV magnesium repletion and IV fluids.  Signed out to oncoming provider pending completion of electrolyte repletion and reevaluation.      FINAL CLINICAL IMPRESSION(S) / ED DIAGNOSES   Final diagnoses:  Palpitations  Hypokalemia  Hypomagnesemia     Rx / DC Orders   ED Discharge Orders     None        Note:  This document was prepared using Dragon voice recognition software and may include unintentional dictation errors.   Trinna Post, MD 12/09/23 [phone]

## 2023-12-09 NOTE — ED Provider Triage Note (Signed)
Emergency Medicine Provider Triage Evaluation Note  BARBY KANNING , a 42 y.o. female  was evaluated in triage.  Pt complains of low potassium, more frequent firing on her pacemaker.  Review of Systems  Positive:  Negative:   Physical Exam  BP 128/84 (BP Location: Right Arm)   Pulse 86   Temp 98.2 F (36.8 C) (Oral)   Resp 16   LMP 06/09/2012   SpO2 100%  Gen:   Awake, no distress , cold Resp:  Normal effort  MSK:   Moves extremities without difficulty.  Left knee presence of hematoma.  Tender to palpation prepatellar area Other:    Medical Decision Making  Medically screening exam initiated at 5:13 PM.  Appropriate orders placed.  [name] was informed that the remainder of the evaluation will be completed by another provider, this initial triage assessment does not replace that evaluation, and the importance of remaining in the ED until their evaluation is complete.     Gladys Damme, PA-C 12/09/23 1715

## 2023-12-10 DIAGNOSIS — E876 Hypokalemia: Secondary | ICD-10-CM | POA: Diagnosis not present

## 2023-12-10 MED ORDER — HEPARIN SOD (PORK) LOCK FLUSH 100 UNIT/ML IV SOLN
INTRAVENOUS | Status: AC
  Administered 2023-12-10: 500 [IU] via INTRAVENOUS
  Filled 2023-12-10: qty 5

## 2023-12-10 MED ORDER — HEPARIN SOD (PORK) LOCK FLUSH 100 UNIT/ML IV SOLN: 500.0000 [IU] | Freq: Once | INTRAVENOUS | Status: AC

## 2023-12-10 NOTE — ED Provider Notes (Signed)
Received in signout from Dr. Rosalia Hammers pending reevaluation after replacement of potassium and magnesium.  Seen for palpitations in the setting of low electrolytes.  Has been replaced orally and IV, she is requesting discharge.  Discussed ED return precautions and PCP follow-up.   Delton Prairie, MD 12/10/23 [phone]

## 2024-03-12 ENCOUNTER — Other Ambulatory Visit: Payer: Self-pay

## 2024-03-12 ENCOUNTER — Emergency Department

## 2024-03-12 DIAGNOSIS — K219 Gastro-esophageal reflux disease without esophagitis: Secondary | ICD-10-CM | POA: Diagnosis present

## 2024-03-12 DIAGNOSIS — Z833 Family history of diabetes mellitus: Secondary | ICD-10-CM | POA: Diagnosis not present

## 2024-03-12 DIAGNOSIS — G800 Spastic quadriplegic cerebral palsy: Secondary | ICD-10-CM | POA: Diagnosis present

## 2024-03-12 DIAGNOSIS — D696 Thrombocytopenia, unspecified: Secondary | ICD-10-CM | POA: Diagnosis present

## 2024-03-12 DIAGNOSIS — I959 Hypotension, unspecified: Secondary | ICD-10-CM | POA: Diagnosis present

## 2024-03-12 DIAGNOSIS — W1830XA Fall on same level, unspecified, initial encounter: Secondary | ICD-10-CM | POA: Diagnosis present

## 2024-03-12 DIAGNOSIS — Z7901 Long term (current) use of anticoagulants: Secondary | ICD-10-CM

## 2024-03-12 DIAGNOSIS — G8929 Other chronic pain: Secondary | ICD-10-CM | POA: Diagnosis present

## 2024-03-12 DIAGNOSIS — Z8249 Family history of ischemic heart disease and other diseases of the circulatory system: Secondary | ICD-10-CM

## 2024-03-12 DIAGNOSIS — I8290 Acute embolism and thrombosis of unspecified vein: Secondary | ICD-10-CM | POA: Diagnosis not present

## 2024-03-12 DIAGNOSIS — G90A Postural orthostatic tachycardia syndrome (POTS): Secondary | ICD-10-CM | POA: Diagnosis present

## 2024-03-12 DIAGNOSIS — N3289 Other specified disorders of bladder: Secondary | ICD-10-CM | POA: Diagnosis present

## 2024-03-12 DIAGNOSIS — E878 Other disorders of electrolyte and fluid balance, not elsewhere classified: Principal | ICD-10-CM | POA: Diagnosis present

## 2024-03-12 DIAGNOSIS — Y92009 Unspecified place in unspecified non-institutional (private) residence as the place of occurrence of the external cause: Secondary | ICD-10-CM | POA: Diagnosis not present

## 2024-03-12 DIAGNOSIS — Z79899 Other long term (current) drug therapy: Secondary | ICD-10-CM

## 2024-03-12 DIAGNOSIS — Z882 Allergy status to sulfonamides status: Secondary | ICD-10-CM

## 2024-03-12 DIAGNOSIS — Z950 Presence of cardiac pacemaker: Secondary | ICD-10-CM

## 2024-03-12 DIAGNOSIS — M549 Dorsalgia, unspecified: Secondary | ICD-10-CM | POA: Diagnosis present

## 2024-03-12 DIAGNOSIS — Z86718 Personal history of other venous thrombosis and embolism: Secondary | ICD-10-CM

## 2024-03-12 DIAGNOSIS — K5900 Constipation, unspecified: Secondary | ICD-10-CM | POA: Diagnosis present

## 2024-03-12 DIAGNOSIS — Z5329 Procedure and treatment not carried out because of patient's decision for other reasons: Secondary | ICD-10-CM | POA: Diagnosis present

## 2024-03-12 DIAGNOSIS — N158 Other specified renal tubulo-interstitial diseases: Principal | ICD-10-CM | POA: Diagnosis present

## 2024-03-12 DIAGNOSIS — R002 Palpitations: Secondary | ICD-10-CM | POA: Diagnosis present

## 2024-03-12 DIAGNOSIS — E876 Hypokalemia: Secondary | ICD-10-CM | POA: Diagnosis present

## 2024-03-12 DIAGNOSIS — I495 Sick sinus syndrome: Secondary | ICD-10-CM | POA: Diagnosis not present

## 2024-03-12 LAB — CBC WITH DIFFERENTIAL/PLATELET
Abs Immature Granulocytes: 0.02 10*3/uL (ref 0.00–0.07)
Basophils Absolute: 0 10*3/uL (ref 0.0–0.1)
Basophils Relative: 0 %
Eosinophils Absolute: 0 10*3/uL (ref 0.0–0.5)
Eosinophils Relative: 0 %
HCT: 35.2 % — ABNORMAL LOW (ref 36.0–46.0)
Hemoglobin: 11.6 g/dL — ABNORMAL LOW (ref 12.0–15.0)
Immature Granulocytes: 0 %
Lymphocytes Relative: 22 %
Lymphs Abs: 1.6 10*3/uL (ref 0.7–4.0)
MCH: 26.2 pg (ref 26.0–34.0)
MCHC: 33 g/dL (ref 30.0–36.0)
MCV: 79.6 fL — ABNORMAL LOW (ref 80.0–100.0)
Monocytes Absolute: 0.4 10*3/uL (ref 0.1–1.0)
Monocytes Relative: 5 %
Neutro Abs: 5.4 10*3/uL (ref 1.7–7.7)
Neutrophils Relative %: 73 %
Platelets: 219 10*3/uL (ref 150–400)
RBC: 4.42 MIL/uL (ref 3.87–5.11)
RDW: 15 % (ref 11.5–15.5)
WBC: 7.5 10*3/uL (ref 4.0–10.5)
nRBC: 0 % (ref 0.0–0.2)

## 2024-03-12 LAB — COMPREHENSIVE METABOLIC PANEL WITH GFR
ALT: 14 U/L (ref 0–44)
AST: 18 U/L (ref 15–41)
Albumin: 4 g/dL (ref 3.5–5.0)
Alkaline Phosphatase: 75 U/L (ref 38–126)
Anion gap: 11 (ref 5–15)
BUN: 18 mg/dL (ref 6–20)
CO2: 26 mmol/L (ref 22–32)
Calcium: 9.5 mg/dL (ref 8.9–10.3)
Chloride: 104 mmol/L (ref 98–111)
Creatinine, Ser: 0.86 mg/dL (ref 0.44–1.00)
GFR, Estimated: >60 mL/min (ref >60)
Glucose, Bld: 113 mg/dL — ABNORMAL HIGH (ref 70–99)
Potassium: 2.8 mmol/L — ABNORMAL LOW (ref 3.5–5.1)
Sodium: 141 mmol/L (ref 135–145)
Total Bilirubin: 0.5 mg/dL (ref 0.0–1.2)
Total Protein: 6.8 g/dL (ref 6.5–8.1)

## 2024-03-12 LAB — MAGNESIUM: Magnesium: 1.2 mg/dL — ABNORMAL LOW (ref 1.7–2.4)

## 2024-03-12 NOTE — ED Notes (Signed)
 Pt arrives via EMS from home with CC of abd pain/back pain for x2 months. Requesting enema. Reports PCP also called her and said magnesium was low.

## 2024-03-12 NOTE — ED Notes (Signed)
 Patient transported to xr at this time

## 2024-03-12 NOTE — ED Triage Notes (Signed)
 See previous note. Pt at baseline.

## 2024-03-13 ENCOUNTER — Emergency Department: Payer: Medicare (Managed Care)

## 2024-03-13 ENCOUNTER — Inpatient Hospital Stay
Admission: EM | Admit: 2024-03-13 | Discharge: 2024-03-15 | DRG: 698 | Discharge status: Against Medical Advice | Payer: Medicare (Managed Care) | Attending: Internal Medicine | Admitting: Internal Medicine

## 2024-03-13 ENCOUNTER — Encounter: Payer: Self-pay | Admitting: Internal Medicine

## 2024-03-13 DIAGNOSIS — G800 Spastic quadriplegic cerebral palsy: Secondary | ICD-10-CM | POA: Diagnosis not present

## 2024-03-13 DIAGNOSIS — K219 Gastro-esophageal reflux disease without esophagitis: Secondary | ICD-10-CM | POA: Diagnosis present

## 2024-03-13 DIAGNOSIS — M549 Dorsalgia, unspecified: Secondary | ICD-10-CM | POA: Diagnosis present

## 2024-03-13 DIAGNOSIS — W1830XA Fall on same level, unspecified, initial encounter: Secondary | ICD-10-CM | POA: Diagnosis present

## 2024-03-13 DIAGNOSIS — W19XXXA Unspecified fall, initial encounter: Secondary | ICD-10-CM

## 2024-03-13 DIAGNOSIS — I959 Hypotension, unspecified: Secondary | ICD-10-CM | POA: Diagnosis present

## 2024-03-13 DIAGNOSIS — I82409 Acute embolism and thrombosis of unspecified deep veins of unspecified lower extremity: Secondary | ICD-10-CM | POA: Diagnosis present

## 2024-03-13 DIAGNOSIS — I495 Sick sinus syndrome: Secondary | ICD-10-CM | POA: Diagnosis not present

## 2024-03-13 DIAGNOSIS — N3289 Other specified disorders of bladder: Secondary | ICD-10-CM | POA: Diagnosis present

## 2024-03-13 DIAGNOSIS — N158 Other specified renal tubulo-interstitial diseases: Secondary | ICD-10-CM | POA: Diagnosis present

## 2024-03-13 DIAGNOSIS — K5900 Constipation, unspecified: Secondary | ICD-10-CM | POA: Diagnosis present

## 2024-03-13 DIAGNOSIS — E878 Other disorders of electrolyte and fluid balance, not elsewhere classified: Secondary | ICD-10-CM | POA: Diagnosis not present

## 2024-03-13 DIAGNOSIS — E876 Hypokalemia: Principal | ICD-10-CM

## 2024-03-13 DIAGNOSIS — R002 Palpitations: Secondary | ICD-10-CM

## 2024-03-13 DIAGNOSIS — I8290 Acute embolism and thrombosis of unspecified vein: Secondary | ICD-10-CM | POA: Diagnosis not present

## 2024-03-13 LAB — URINALYSIS, ROUTINE W REFLEX MICROSCOPIC
Bacteria, UA: NONE SEEN
Bilirubin Urine: NEGATIVE
Glucose, UA: NEGATIVE mg/dL
Ketones, ur: NEGATIVE mg/dL
Leukocytes,Ua: NEGATIVE
Nitrite: NEGATIVE
Protein, ur: NEGATIVE mg/dL
Specific Gravity, Urine: 1.02 (ref 1.005–1.030)
pH: 5 (ref 5.0–8.0)

## 2024-03-13 LAB — LIPASE, BLOOD: Lipase: 26 U/L (ref 11–51)

## 2024-03-13 LAB — COMPREHENSIVE METABOLIC PANEL WITH GFR
ALT: 18 U/L (ref 0–44)
AST: 23 U/L (ref 15–41)
Albumin: 3.3 g/dL — ABNORMAL LOW (ref 3.5–5.0)
Alkaline Phosphatase: 72 U/L (ref 38–126)
Anion gap: 9 (ref 5–15)
BUN: 17 mg/dL (ref 6–20)
CO2: 25 mmol/L (ref 22–32)
Calcium: 8.7 mg/dL — ABNORMAL LOW (ref 8.9–10.3)
Chloride: 106 mmol/L (ref 98–111)
Creatinine, Ser: 0.84 mg/dL (ref 0.44–1.00)
GFR, Estimated: >60 mL/min (ref >60)
Glucose, Bld: 110 mg/dL — ABNORMAL HIGH (ref 70–99)
Potassium: 3 mmol/L — ABNORMAL LOW (ref 3.5–5.1)
Sodium: 140 mmol/L (ref 135–145)
Total Bilirubin: 0.8 mg/dL (ref 0.0–1.2)
Total Protein: 5.9 g/dL — ABNORMAL LOW (ref 6.5–8.1)

## 2024-03-13 LAB — BASIC METABOLIC PANEL WITH GFR
Anion gap: 12 (ref 5–15)
BUN: 19 mg/dL (ref 6–20)
CO2: 23 mmol/L (ref 22–32)
Calcium: 8.4 mg/dL — ABNORMAL LOW (ref 8.9–10.3)
Chloride: 99 mmol/L (ref 98–111)
Creatinine, Ser: 0.72 mg/dL (ref 0.44–1.00)
GFR, Estimated: >60 mL/min (ref >60)
Glucose, Bld: 112 mg/dL — ABNORMAL HIGH (ref 70–99)
Potassium: 2.9 mmol/L — ABNORMAL LOW (ref 3.5–5.1)
Sodium: 134 mmol/L — ABNORMAL LOW (ref 135–145)

## 2024-03-13 LAB — MAGNESIUM
Magnesium: 1.3 mg/dL — ABNORMAL LOW (ref 1.7–2.4)
Magnesium: 4.4 mg/dL — ABNORMAL HIGH (ref 1.7–2.4)

## 2024-03-13 MED ORDER — FLEET ENEMA RE ENEM
1.0000 | ENEMA | Freq: Once | RECTAL | Status: DC
Start: 2024-03-13 — End: 2024-03-13

## 2024-03-13 MED ORDER — PANTOPRAZOLE SODIUM 40 MG PO TBEC
40.0000 mg | DELAYED_RELEASE_TABLET | Freq: Every day | ORAL | Status: DC
  Administered 2024-03-13 – 2024-03-15 (×3): 40 mg via ORAL
  Filled 2024-03-13 (×3): qty 1

## 2024-03-13 MED ORDER — METHYLPHENIDATE HCL 20 MG PO TABS
20.0000 mg | ORAL_TABLET | ORAL | Status: DC
Start: 2024-03-13 — End: 2024-03-16
  Administered 2024-03-13 – 2024-03-15 (×7): 20 mg via ORAL
  Filled 2024-03-13 (×3): qty 2
  Filled 2024-03-13: qty 4
  Filled 2024-03-13 (×3): qty 2

## 2024-03-13 MED ORDER — MIDODRINE HCL 5 MG PO TABS
10.0000 mg | ORAL_TABLET | Freq: Three times a day (TID) | ORAL | Status: DC
  Administered 2024-03-13 – 2024-03-15 (×4): 10 mg via ORAL
  Filled 2024-03-13 (×6): qty 2

## 2024-03-13 MED ORDER — SMOG ENEMA
960.0000 mL | Freq: Once | RECTAL | Status: DC
  Filled 2024-03-13: qty 960

## 2024-03-13 MED ORDER — ONDANSETRON HCL 4 MG PO TABS
4.0000 mg | ORAL_TABLET | Freq: Four times a day (QID) | ORAL | Status: DC | PRN
  Administered 2024-03-13 – 2024-03-14 (×2): 4 mg via ORAL
  Filled 2024-03-13 (×2): qty 1

## 2024-03-13 MED ORDER — SODIUM CHLORIDE 0.9 % IV BOLUS (SEPSIS)
1000.0000 mL | Freq: Once | INTRAVENOUS | Status: AC
  Administered 2024-03-13: 1000 mL via INTRAVENOUS

## 2024-03-13 MED ORDER — SUCRALFATE 1 G PO TABS
1.0000 g | ORAL_TABLET | Freq: Four times a day (QID) | ORAL | Status: DC
  Administered 2024-03-13 – 2024-03-15 (×9): 1 g via ORAL
  Filled 2024-03-13 (×10): qty 1

## 2024-03-13 MED ORDER — PREGABALIN 75 MG PO CAPS
150.0000 mg | ORAL_CAPSULE | Freq: Two times a day (BID) | ORAL | Status: DC
  Administered 2024-03-13 – 2024-03-14 (×3): 150 mg via ORAL
  Administered 2024-03-15: 75 mg via ORAL
  Filled 2024-03-13 (×5): qty 2

## 2024-03-13 MED ORDER — POTASSIUM CHLORIDE CRYS ER 20 MEQ PO TBCR
20.0000 meq | EXTENDED_RELEASE_TABLET | Freq: Two times a day (BID) | ORAL | Status: DC
  Administered 2024-03-14: 20 meq via ORAL
  Filled 2024-03-13: qty 1

## 2024-03-13 MED ORDER — ONDANSETRON HCL 4 MG/2ML IJ SOLN
4.0000 mg | Freq: Four times a day (QID) | INTRAMUSCULAR | Status: DC | PRN
  Filled 2024-03-13: qty 2

## 2024-03-13 MED ORDER — MAGNESIUM SULFATE 4 GM/100ML IV SOLN
4.0000 g | Freq: Once | INTRAVENOUS | Status: AC
  Administered 2024-03-13: 4 g via INTRAVENOUS
  Filled 2024-03-13: qty 100

## 2024-03-13 MED ORDER — SODIUM CHLORIDE 0.9 % IV SOLN
12.5000 mg | Freq: Four times a day (QID) | INTRAVENOUS | Status: DC | PRN
  Filled 2024-03-13: qty 0.5

## 2024-03-13 MED ORDER — POTASSIUM CHLORIDE CRYS ER 20 MEQ PO TBCR
40.0000 meq | EXTENDED_RELEASE_TABLET | Freq: Once | ORAL | Status: AC
  Administered 2024-03-13: 40 meq via ORAL
  Filled 2024-03-13: qty 2

## 2024-03-13 MED ORDER — POTASSIUM CHLORIDE CRYS ER 20 MEQ PO TBCR: 20.0000 meq | EXTENDED_RELEASE_TABLET | Freq: Two times a day (BID) | ORAL | Status: DC

## 2024-03-13 MED ORDER — KETOROLAC TROMETHAMINE 30 MG/ML IJ SOLN
30.0000 mg | Freq: Once | INTRAMUSCULAR | Status: AC
  Administered 2024-03-13: 30 mg via INTRAVENOUS
  Filled 2024-03-13: qty 1

## 2024-03-13 MED ORDER — POTASSIUM CHLORIDE 10 MEQ/100ML IV SOLN
10.0000 meq | INTRAVENOUS | Status: AC
  Administered 2024-03-13 (×2): 10 meq via INTRAVENOUS
  Filled 2024-03-13 (×2): qty 100

## 2024-03-13 MED ORDER — MAGNESIUM SULFATE 2 GM/50ML IV SOLN
2.0000 g | Freq: Once | INTRAVENOUS | Status: AC
  Administered 2024-03-13: 2 g via INTRAVENOUS
  Filled 2024-03-13: qty 50

## 2024-03-13 MED ORDER — SUCRALFATE 1 G PO TABS
1.0000 g | ORAL_TABLET | Freq: Once | ORAL | Status: AC
  Administered 2024-03-13: 1 g via ORAL
  Filled 2024-03-13: qty 1

## 2024-03-13 MED ORDER — HEPARIN SOD (PORK) LOCK FLUSH 100 UNIT/ML IV SOLN: 500.0000 [IU] | INTRAVENOUS | Status: AC

## 2024-03-13 MED ORDER — PREGABALIN 75 MG PO CAPS: 150.0000 mg | ORAL_CAPSULE | Freq: Two times a day (BID) | ORAL | Status: DC

## 2024-03-13 MED ORDER — APIXABAN 5 MG PO TABS
5.0000 mg | ORAL_TABLET | Freq: Two times a day (BID) | ORAL | Status: DC
  Administered 2024-03-13 – 2024-03-15 (×5): 5 mg via ORAL
  Filled 2024-03-13 (×5): qty 1

## 2024-03-13 MED ORDER — MAGNESIUM HYDROXIDE 400 MG/5ML PO SUSP
15.0000 mL | Freq: Every day | ORAL | Status: DC | PRN
  Administered 2024-03-13: 15 mL via ORAL
  Filled 2024-03-13: qty 30

## 2024-03-13 MED ORDER — ONDANSETRON HCL 4 MG/2ML IJ SOLN
4.0000 mg | Freq: Once | INTRAMUSCULAR | Status: DC
  Filled 2024-03-13: qty 2

## 2024-03-13 NOTE — Assessment & Plan Note (Signed)
 Noted ground-level fall x 2 home in setting of cerebral palsy and spastic quadriparesis Subacute versus chronic issue No reported head trauma or LOC Pain stable at present Monitor

## 2024-03-13 NOTE — H&P (Signed)
 History and Physical    Patient: [name] HYQ:[id number] DOB: Dec 22, 1980 DOA: 03/13/2024 DOS: the patient was seen and examined on 03/13/2024 PCP: Dione Housekeeper, MD  Patient coming from: Home  Chief Complaint:  Chief Complaint  Patient presents with   Constipation   HPI: [name] is a 43 y.o. 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  Patient reports being transferred to the ER secondary to low magnesium levels at PCP office.  This has been a chronic issue.  Noted admission November 2020 for similar issues associated with patient not getting IV electrolyte infusions.Pt was supposed to be getting outpatient electrolyte infusions. Unsure if this being done.  Patient also reports ground-level falls at home with back spasm.  This has been a chronic issue in the setting of cerebral palsy with spastic quadriparesis.  No head trauma loss of consciousness.  No chest pain.  No fevers or chills.  Has had some constipation.  No reported shortness of breath. Presented to the ER afebrile, hemodynamically stable.  Satting well on room air.  White count 7.5, hemoglobin 11.6, platelets 219, urinalysis not indicative of infection.  Creatinine 0.6.  Potassium 2.8.  Magnesium 1.2.  L-spine plain films within normal limits.  KUB and chest x-ray stable. Review of Systems: As mentioned in the history of present illness. All other systems reviewed and are negative. Past Medical History:  Diagnosis Date   Carpal tunnel syndrome    Cerebral palsy (HCC)    Chronic back pain    Gitelman syndrome    Hypokalemia    now resolved   Magnesium deficiency    Pacemaker    PONV (postoperative nausea and vomiting)    Postural orthostatic tachycardia syndrome    s/p pacemaker  placement.   RSD (reflex sympathetic dystrophy)    right foot,    RSD (reflex sympathetic dystrophy)    SA node dysfunction (HCC) 2008   Past Surgical History:  Procedure Laterality Date   ABDOMINAL HYSTERECTOMY Left 2013   Dr. Tiburcio Pea   COLONOSCOPY WITH PROPOFOL N/A 12/11/2022   Procedure: COLONOSCOPY WITH PROPOFOL;  Surgeon: Toney Reil, MD;  Location: [facility] ENDOSCOPY;  Service: Gastroenterology;  Laterality: N/A;   COLONOSCOPY WITH PROPOFOL N/A 12/13/2022   Procedure: COLONOSCOPY WITH PROPOFOL;  Surgeon: Toney Reil, MD;  Location: [facility] ENDOSCOPY;  Service: Gastroenterology;  Laterality: N/A;   INSERT / REPLACE / REMOVE PACEMAKER     ORTHOPEDIC SURGERY     PACEMAKER INSERTION     PORTACATH PLACEMENT     Social History:  reports that she has never smoked. She has never used smokeless tobacco. She reports that she does not drink alcohol and does not use drugs.  Allergies  Allergen Reactions   Duloxetine     Other reaction(s): Other (See Comments), Palpitations Other reaction(s): Hallucinations, Hyperactive behavior (finding), Other (See Comments) Stressed out     Flecainide     Other reaction(s): Other (See Comments) Other reaction(s): Hypomagnesemia (disorder), Other (See Comments) Other Reaction: QUESTION HYPOMAGNESEMIA     Fluoxetine     Other reaction(s): Nausea And Vomiting, Other (See Comments) Other reaction(s): Other (See Comments) Other Reaction: INCREASED HR  causes irritability and irrational behavior "I go crazy"   Gabapentin     Goes crazy Other reaction(s): Other (See Comments) Other reaction(s): Hyperactive  behavior (finding), Mental Status Changes (intolerance), Other (See Comments) Goes crazy Hallucinations   Baclofen     Other reaction(s): Other (See Comments), Other (See Comments) Weakness  Other reaction(s): Other (See Comments) Weakness and loose Weakness  Other reaction(s):  Weakness     Cephalosporins     Other reaction(s):  Hives, Rash Received pre-op vancomycin and cefazolin 07/20/16. Developed urticarial rash on bilateral lower extremities + slight periorbital swelling. Treated with diphenhydramine. Received pre-op vancomycin and cefazolin 07/20/16. Developed urticarial rash on bilateral lower extremities + slight periorbital swelling. Treated with diphenhydramine. Received pre-op vancomycin and cefazolin 07/20/16. Developed urticarial rash on bilateral lower extremities + slight periorbital swelling. Treated with diphenhydramine.   Sulfasalazine     Other reaction(s): Nausea And Vomiting   Sulfa Antibiotics Nausea And Vomiting   Vancomycin     Other reaction(s): Hives Received pre-op vancomycin and cefazolin 07/20/16. Developed urticarial rash on bilateral lower extremities + slight periorbital swelling. Treated with diphenhydramine. Received pre-op vancomycin and cefazolin 07/20/16. Developed urticarial rash on bilateral lower extremities + slight periorbital swelling. Treated with diphenhydramine. Received pre-op vancomycin and cefazolin 07/20/16. Developed urticarial rash on bilateral lower extremities + slight periorbital swelling. Treated with diphenhydramine.   Cefazolin Rash    Tolerated ceftriaxone 2023 and cephalexin 2022    Family History  Problem Relation Age of Onset   Diabetes Mother    Hypertension Mother     Prior to Admission medications   Medication Sig Start Date End Date Taking? Authorizing Provider  acetaminophen (TYLENOL) 325 MG tablet Take 2 tablets (650 mg total) by mouth every 6 (six) hours as needed for mild pain (pain score 1-3) or fever. 09/28/23   Gillis Santa, MD  bisacodyl (DULCOLAX) 10 MG suppository Place 1 suppository (10 mg total) rectally daily as needed for severe constipation. 09/28/23 09/27/24  Gillis Santa, MD  bisacodyl (DULCOLAX) 5 MG EC tablet Take 2 tablets (10 mg total) by mouth 2 (two) times daily. 09/28/23 09/27/24  Gillis Santa, MD  Cholecalciferol (D2000 ULTRA  STRENGTH) 50 MCG (2000 UT) CAPS Take 1 tablet by mouth daily.    [provider]  cyanocobalamin (VITAMIN B12) 1000 MCG tablet Take 1,000 mcg by mouth daily.    [provider]  diclofenac Sodium (VOLTAREN) 1 % GEL Apply 2 g topically 4 (four) times daily. 07/15/22 06/13/24  [provider]  ELIQUIS 5 MG TABS tablet Take 5 mg by mouth 2 (two) times daily. 08/12/22   [provider]  famotidine (PEPCID) 40 MG tablet Take 1 tablet (40 mg total) by mouth at bedtime as needed for heartburn. 09/28/23   Gillis Santa, MD  heparin lock flush 100 UNIT/ML SOLN injection Inject 5 mL (500 Units total) as directed as needed for line care. Flush using SASH (saline, administer  IV, saline, heparin) method as directed. Flush IV catheter with heparin after the last saline flush.  Use syringe ONE TIME only then discard. Storage: Room Temperature. 08/30/23 08/30/24  [provider]  LINZESS 290 MCG CAPS capsule Take 290 mcg by mouth daily. 10/26/22   [provider]  magnesium (MAGTAB) 84 MG ( ) TBCR SR tablet Take 936 mg by mouth 3 (three) times daily. 11/30/22   [provider]  magnesium chloride 200 MG/ML SOLN Inject 20 mLs (4,000 mg total) as directed 2 (two) times a week. 4gm MgSulfate on 1000 ml LR per home infusion 09/29/23 09/28/24  Gillis Santa, MD  methylphenidate (RITALIN) 20 MG tablet Take 20 mg by mouth 3 (  three) times daily. Take 1 tablet (20 mg) TID at 0800, 1200 & 2100-2200    [provider]  metoCLOPramide (REGLAN) 5 MG tablet Take 5 mg by mouth 4 (four) times daily.    [provider]  midodrine (PROAMATINE) 10 MG tablet Take 1 tablet (10 mg total) by mouth 3 (three) times daily with meals. Hold if Systolic BP >110 45/40/98 03/26/24  Gillis Santa, MD  montelukast (SINGULAIR) 10 MG tablet Take 10 mg by mouth daily as needed (allergies). 10/26/22   [provider]  polyethylene glycol (MIRALAX / GLYCOLAX) 17 g packet  Take 17 g by mouth 2 (two) times daily. 09/28/23 09/27/24  Gillis Santa, MD  potassium chloride SA (KLOR-CON M) 20 MEQ tablet Take 20 mEq by mouth 2 (two) times daily.    [provider]  pregabalin (LYRICA) 150 MG capsule Take 150 mg by mouth in the morning, at noon, and at bedtime.    [provider]  PREMARIN vaginal cream Place 0.5 g vaginally 2 (two) times a week. 11/08/22   [provider]  sucralfate (CARAFATE) 1 g tablet Take 1 g by mouth 4 (four) times daily.    [provider]  traMADol (ULTRAM) 50 MG tablet Take 50 mg by mouth every 6 (six) hours as needed for moderate pain. 09/20/13   [provider]    Physical Exam: Vitals:   03/13/24 0230 03/13/24 0322 03/13/24 0509 03/13/24 0607  BP: (!) 128/106  (!) 127/91   Pulse: 83  85   Resp: 20     Temp:  98 F (36.7 C)  98.1 F (36.7 C)  TempSrc:  Oral  Oral  SpO2:   100%   Weight:      Height:       Physical Exam Constitutional:      Comments: Underweight  Baseline spastic quadriparesis    HENT:     Head: Normocephalic and atraumatic.     Nose: Nose normal.  Eyes:     Pupils: Pupils are equal, round, and reactive to light.  Cardiovascular:     Rate and Rhythm: Normal rate and regular rhythm.  Pulmonary:     Effort: Pulmonary effort is normal.  Abdominal:     General: Bowel sounds are normal.  Musculoskeletal:        General: Normal range of motion.  Neurological:     Comments: + spastic quadriparesis  Otherwise grossly nonfocal neuro exam    Psychiatric:        Mood and Affect: Mood normal.     Data Reviewed:  There are no new results to review at this time.  DG Lumbar Spine Complete CLINICAL DATA:  Fall with back pain for 2 months.  EXAM: LUMBAR SPINE - COMPLETE 4+ VIEW  COMPARISON:  Lumbar spine CT 11/10/2023  FINDINGS: No evidence for an acute fracture. Degenerative disc disease noted T12-L1, L1-2, L2-3, similar to previous CT study. SI  joints unremarkable. Battery pack for stimulator device overlies the right abdomen. Large stool volume noted throughout the colon.  IMPRESSION: 1. No acute bony abnormality.  Electronically Signed   By: Kennith Center [name]   On: 03/13/2024 05:18  Lab Results  Component Value Date   WBC 7.5 03/12/2024   HGB 11.6 (L) 03/12/2024   HCT 35.2 (L) 03/12/2024   MCV 79.6 (L) 03/12/2024   PLT 219 03/12/2024   Last metabolic panel Lab Results  Component Value Date   GLUCOSE 113 (H) 03/12/2024  NA 141 03/12/2024   K 2.8 (L) 03/12/2024   CL 104 03/12/2024   CO2 26 03/12/2024   BUN 18 03/12/2024   CREATININE 0.86 03/12/2024   GFRNONAA >60 03/12/2024   CALCIUM 9.5 03/12/2024   PHOS 2.7 10/18/2023   PROT 6.8 03/12/2024   ALBUMIN 4.0 03/12/2024   BILITOT 0.5 03/12/2024   ALKPHOS 75 03/12/2024   AST 18 03/12/2024   ALT 14 03/12/2024   ANIONGAP 11 03/12/2024    Assessment and Plan: * Electrolyte abnormality Gitleman syndrome Recurrent hypokalemia and hypomagnesemia in the setting of Gitleman syndrome Potassium 2.8  Magnesium 1.2 Replete  Case preliminarily discussed with Dr. Thedore Mins with nephrology Will need to be set back up with outpatient infusions after discharge Follow    Ground-level fall Noted ground-level fall x 2 home in setting of cerebral palsy and spastic quadriparesis Subacute versus chronic issue No reported head trauma or LOC Pain stable at present Monitor  GERD without esophagitis Continue Carafate and PPI  SA node dysfunction (HCC) Status post pacemaker placement  Hypotension BP 120s/90s  Cont midodrine with meals  Follow   Bladder spasms Patient with history of cerebral palsy with spastic quadriparesis. She gets around her home with a wheelchair, but notes that she does crawl around occasionally as wel   DVT (deep venous thrombosis) (HCC) Chronic issue Continue Xarelto  Constipation Continue home bowel regimen  Back pain Noted chronic  back pain Continue Lyrica      Advance Care Planning:   Code Status: Full Code   Consults: Consider nepr  Family Communication: No family at the bedside   Severity of Illness: The appropriate patient status for this patient is OBSERVATION. Observation status is judged to be reasonable and necessary in order to provide the required intensity of service to ensure the patient's safety. The patient's presenting symptoms, physical exam findings, and initial radiographic and laboratory data in the context of their medical condition is felt to place them at decreased risk for further clinical deterioration. Furthermore, it is anticipated that the patient will be medically stable for discharge from the hospital within 2 midnights of admission.   Author: Floydene Flock, MD 03/13/2024 9:50 AM  For on call review [email or web address].

## 2024-03-13 NOTE — Assessment & Plan Note (Signed)
 Chronic issue Continue Xarelto

## 2024-03-13 NOTE — Assessment & Plan Note (Signed)
 Continue home bowel regimen

## 2024-03-13 NOTE — ED Notes (Signed)
 Pt assisted on to bedpan.  urinary output.  Peri care provided.  Pt repositioned onto right side.  Denies any other needs at this time.

## 2024-03-13 NOTE — Assessment & Plan Note (Signed)
 Continue Carafate and PPI

## 2024-03-13 NOTE — ED Notes (Signed)
 In patient's room to administer her SMOG enema.  Patient states, "I can't do that right now.  My back hurts and I need my Lyrica.  It helps me."  I explained to the patient that relieving her constipation with the enema could aid in decreasing her back pain.  She states, "I just can't, I am sorry.  I want to sleep now."  Informed provider that pt is requesting Lyrica before her enema.

## 2024-03-13 NOTE — Assessment & Plan Note (Addendum)
 Gitleman syndrome Recurrent hypokalemia and hypomagnesemia in the setting of Gitleman syndrome Potassium 2.8  Magnesium 1.2 Replete  Case preliminarily discussed with Dr. Thedore Mins with nephrology Will need to be set back up with outpatient infusions after discharge Follow

## 2024-03-13 NOTE — Assessment & Plan Note (Signed)
 Patient with history of cerebral palsy with spastic quadriparesis. She gets around her home with a wheelchair, but notes that she does crawl around occasionally as wel

## 2024-03-13 NOTE — Consult Note (Addendum)
 PHARMACY CONSULT NOTE - FOLLOW UP  Pharmacy Consult for Electrolyte Monitoring and Replacement   Recent Labs: Potassium (mmol/L)  Date Value  03/13/2024 3.0 (L)  01/19/2015 3.4 (L)   Magnesium (mg/dL)  Date Value  45/40/9811 1.3 (L)  01/19/2015 1.0 (L)   Calcium (mg/dL)  Date Value  91/47/8295 8.7 (L)   Calcium, Total (mg/dL)  Date Value  62/13/0865 8.3 (L)   Albumin (g/dL)  Date Value  78/46/9629 3.3 (L)  06/14/2014 4.0   Phosphorus (mg/dL)  Date Value  52/84/1324 2.7   Sodium (mmol/L)  Date Value  03/13/2024 140  01/19/2015 142   Assessment: [name] is a 43 yo female who presented to the emergency department due to palpitations and states that her Mg levels are low. They report feeling weak and states she has lost her balance and fell several days ago injuring her back. They also have had nausea and vomiting taking Zofran and Phenergan. 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 Pharmacy has been consulted to manage this patient's electrolytes.   Goal of Therapy:  Electrolytes WNL  Plan:  K = 3.0: Provider ordered 20 mEq potassium BID. 20 mEq to be given tonight at 2200. Ordered an additional 20 mEq Mg = 1.3: Give 4g of Magnesium Sulfate IV x 1 Corrected Ca = 9.3: no replacement indicated  Continue to follow electrolytes with AM labs  Thank you for allowing pharmacy to participate in this patient's care.  Effie Shy, PharmD Pharmacy Resident  03/13/2024 5:42 PM

## 2024-03-13 NOTE — Assessment & Plan Note (Signed)
 Noted chronic back pain Continue Lyrica

## 2024-03-13 NOTE — Assessment & Plan Note (Signed)
 BP 120s/90s  Cont midodrine with meals  Follow

## 2024-03-13 NOTE — ED Provider Notes (Signed)
 [facility] Provider Note    Event Date/Time   First MD Initiated Contact with Patient 03/13/24 0147     (approximate)   History   Constipation   HPI  [name] is a 43 y.o. female with history of cerebral palsy, Gitelman syndrome, SA node dysfunction status post pacemaker who presents to the emergency department multiple complaints.  Complains of palpitations and states her PCP told her her magnesium level was low.  She also reports feeling very weak and states she lost her balance and fell several days ago injuring her back.  Does have history of chronic back pain.  No new numbness, weakness, bowel or bladder incontinence.  Also reports being constipated for the past 2 months.  Is on Linzess and dulcolax.  She states she has had nausea and vomiting.  She has been taking Zofran and previously on Phenergan for the same.  States her primary care doctor told her to come to the emergency department for an enema.   History provided by patient.    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   RSD (reflex sympathetic dystrophy)    right foot,    RSD (reflex sympathetic dystrophy)    SA node dysfunction (HCC) 2008    Past Surgical History:  Procedure Laterality Date   ABDOMINAL HYSTERECTOMY Left 2013   Dr. Tiburcio Pea   COLONOSCOPY WITH PROPOFOL N/A 12/11/2022   Procedure: COLONOSCOPY WITH PROPOFOL;  Surgeon: Toney Reil, MD;  Location: [facility] ENDOSCOPY;  Service: Gastroenterology;  Laterality: N/A;   COLONOSCOPY WITH PROPOFOL N/A 12/13/2022   Procedure: COLONOSCOPY WITH PROPOFOL;  Surgeon: Toney Reil, MD;  Location: [facility] ENDOSCOPY;  Service: Gastroenterology;   Laterality: N/A;   INSERT / REPLACE / REMOVE PACEMAKER     ORTHOPEDIC SURGERY     PACEMAKER INSERTION     PORTACATH PLACEMENT      MEDICATIONS:  Prior to Admission medications   Medication Sig Start Date End Date Taking? Authorizing Provider  acetaminophen (TYLENOL) 325 MG tablet Take 2 tablets (650 mg total) by mouth every 6 (six) hours as needed for mild pain (pain score 1-3) or fever. 09/28/23   Gillis Santa, MD  bisacodyl (DULCOLAX) 10 MG suppository Place 1 suppository (10 mg total) rectally daily as needed for severe constipation. 09/28/23 09/27/24  Gillis Santa, MD  bisacodyl (DULCOLAX) 5 MG EC tablet Take 2 tablets (10 mg total) by mouth 2 (two) times daily. 09/28/23 09/27/24  Gillis Santa, MD  Cholecalciferol (D2000 ULTRA STRENGTH) 50 MCG (2000 UT) CAPS Take 1 tablet by mouth daily.    [provider]  cyanocobalamin (VITAMIN B12) 1000 MCG tablet Take 1,000 mcg by mouth daily.    [provider]  diclofenac Sodium (VOLTAREN) 1 % GEL Apply 2 g topically 4 (four) times daily. 07/15/22 06/13/24  [provider]  ELIQUIS 5 MG TABS tablet Take 5 mg by mouth 2 (two) times daily. 08/12/22   [provider]  famotidine (PEPCID) 40 MG tablet Take 1 tablet (40 mg total) by mouth at bedtime as needed for heartburn. 09/28/23   Gillis Santa, MD  heparin lock flush 100  UNIT/ML SOLN injection Inject 5 mL (500 Units total) as directed as needed for line care. Flush using SASH (saline, administer  IV, saline, heparin) method as directed. Flush IV catheter with heparin after the last saline flush.  Use syringe ONE TIME only then discard. Storage: Room Temperature. 08/30/23 08/30/24  [provider]  LINZESS 290 MCG CAPS capsule Take 290 mcg by mouth daily. 10/26/22   [provider]  magnesium (MAGTAB) 84 MG ( ) TBCR SR tablet Take 936 mg by mouth 3 (three) times daily. 11/30/22   [provider]  magnesium chloride 200 MG/ML SOLN Inject 20  mLs (4,000 mg total) as directed 2 (two) times a week. 4gm MgSulfate on 1000 ml LR per home infusion 09/29/23 09/28/24  Gillis Santa, MD  methylphenidate (RITALIN) 20 MG tablet Take 20 mg by mouth 3 (three) times daily. Take 1 tablet (20 mg) TID at 0800, 1200 & 2100-2200    [provider]  metoCLOPramide (REGLAN) 5 MG tablet Take 5 mg by mouth 4 (four) times daily.    [provider]  midodrine (PROAMATINE) 10 MG tablet Take 1 tablet (10 mg total) by mouth 3 (three) times daily with meals. Hold if Systolic BP >110 16/10/96 03/26/24  Gillis Santa, MD  montelukast (SINGULAIR) 10 MG tablet Take 10 mg by mouth daily as needed (allergies). 10/26/22   [provider]  polyethylene glycol (MIRALAX / GLYCOLAX) 17 g packet Take 17 g by mouth 2 (two) times daily. 09/28/23 09/27/24  Gillis Santa, MD  potassium chloride SA (KLOR-CON M) 20 MEQ tablet Take 20 mEq by mouth 2 (two) times daily.    [provider]  pregabalin (LYRICA) 150 MG capsule Take 150 mg by mouth in the morning, at noon, and at bedtime.    [provider]  PREMARIN vaginal cream Place 0.5 g vaginally 2 (two) times a week. 11/08/22   [provider]  sucralfate (CARAFATE) 1 g tablet Take 1 g by mouth 4 (four) times daily.    [provider]  traMADol (ULTRAM) 50 MG tablet Take 50 mg by mouth every 6 (six) hours as needed for moderate pain. 09/20/13   [provider]    Physical Exam   Triage Vital Signs: ED Triage Vitals  Encounter Vitals Group     BP 03/13/24 0229 (!) 128/96     Systolic BP Percentile --      Diastolic BP Percentile --      Pulse Rate 03/13/24 0229 85     Resp 03/12/24 2105 (!) 21     Temp 03/13/24 0229 97.9 F (36.6 C)     Temp Source 03/13/24 0229 Oral     SpO2 03/13/24 0229 99 %     Weight 03/12/24 2105 86 lb 3.2 oz (39.1 kg)     Height 03/12/24 2105 5\' 2"  (1.575 m)     Head Circumference --      Peak Flow --      Pain Score 03/12/24  2104 9     Pain Loc --      Pain Education --      Exclude from Growth Chart --     Most recent vital signs: Vitals:   03/13/24 0322 03/13/24 0509  BP:  (!) 127/91  Pulse:  85  Resp:    Temp: 98 F (36.7 C)   SpO2:  100%     CONSTITUTIONAL: Alert, responds appropriately to questions.  Thin, chronically ill-appearing HEAD: Normocephalic, atraumatic EYES: Conjunctivae  clear, pupils appear equal, sclera nonicteric ENT: normal nose; dry mucous membranes NECK: Supple, normal ROM CARD: RRR; S1 and S2 appreciated RESP: Normal chest excursion without splinting or tachypnea; breath sounds clear and equal bilaterally; no wheezes, no rhonchi, no rales, no hypoxia or respiratory distress, speaking full sentences ABD/GI: Non-distended; soft, non-tender, no rebound, no guarding, no peritoneal signs BACK: The back appears normal, tender over the lower lumbar spine without step-off or deformity or overlying skin changes EXT: Normal ROM in all joints; no deformity noted, no edema, spasticity noted of her upper extremities SKIN: Normal color for age and race; warm; no rash on exposed skin NEURO: Moves all extremities equally, slightly dysarthric speech which is chronic, normal sensation, no saddle anesthesia, no hyperreflexia PSYCH: The patient's mood and manner are appropriate.   ED Results / Procedures / Treatments   LABS: (all labs ordered are listed, but only abnormal results are displayed) Labs Reviewed  CBC WITH DIFFERENTIAL/PLATELET - Abnormal; Notable for the following components:      Result Value   Hemoglobin 11.6 (*)    HCT 35.2 (*)    MCV 79.6 (*)    All other components within normal limits  COMPREHENSIVE METABOLIC PANEL WITH GFR - Abnormal; Notable for the following components:   Potassium 2.8 (*)    Glucose, Bld 113 (*)    All other components within normal limits  URINALYSIS, ROUTINE W REFLEX MICROSCOPIC - Abnormal; Notable for the following components:   Color, Urine  YELLOW (*)    APPearance CLEAR (*)    Hgb urine dipstick SMALL (*)    All other components within normal limits  MAGNESIUM - Abnormal; Notable for the following components:   Magnesium 1.2 (*)    All other components within normal limits  LIPASE, BLOOD     EKG:  EKG Interpretation Date/Time:  Tuesday March 13 2024 02:45:40 EDT Ventricular Rate:  86 PR Interval:  140 QRS Duration:  100 QT Interval:  396 QTC Calculation: 473 R Axis:   91  Text Interpretation: Atrial-paced rhythm Rightward axis Incomplete right bundle branch block Abnormal ECG When compared with ECG of 09-Dec-2023 17:00, No significant change was found Confirmed by Rochele Raring [phone]) on 03/13/2024 2:47:34 AM         RADIOLOGY: My personal review and interpretation of imaging: Abdominal x-ray shows constipation, no bowel obstruction.  X-ray of the lumbar spine shows no fracture.  I have personally reviewed all radiology reports.   DG Lumbar Spine Complete Result Date: 03/13/2024 CLINICAL DATA:  Fall with back pain for 2 months. EXAM: LUMBAR SPINE - COMPLETE 4+ VIEW COMPARISON:  Lumbar spine CT 11/10/2023 FINDINGS: No evidence for an acute fracture. Degenerative disc disease noted T12-L1, L1-2, L2-3, similar to previous CT study. SI joints unremarkable. Battery pack for stimulator device overlies the right abdomen. Large stool volume noted throughout the colon. IMPRESSION: 1. No acute bony abnormality. Electronically Signed   By: Kennith Center [name]   On: 03/13/2024 05:18   DG Abdomen Acute W/Chest Result Date: 03/12/2024 CLINICAL DATA:  Constipation EXAM: DG ABDOMEN ACUTE WITH 1 VIEW CHEST COMPARISON:  09/26/2023 FINDINGS: Lungs are clear. No pneumothorax or pleural effusion. Cardiac size within normal limits. Pulmonary vascularity is normal. Right internal jugular chest port tip is seen within the right atrium. Left subclavian single lead pacemaker tip is seen within the expected right atrium. Epicardial pacer leads  overlie the right atrium. Nonobstructive bowel gas pattern. Large volume stool seen throughout the colon. No free  intraperitoneal gas. No organomegaly. No acute bone abnormality. IMPRESSION: 1. No active disease. 2. Large volume stool throughout the colon. Electronically Signed   By: Helyn Numbers [name]   On: 03/12/2024 21:31     PROCEDURES:  Critical Care performed: Yes, see critical care procedure note(s)   CRITICAL CARE Performed by: Baxter Hire Joram Venson   Total critical care time: 30 minutes  Critical care time was exclusive of separately billable procedures and treating other patients.  Critical care was necessary to treat or prevent imminent or life-threatening deterioration.  Critical care was time spent personally by me on the following activities: development of treatment plan with patient and/or surrogate as well as nursing, discussions with consultants, evaluation of patient's response to treatment, examination of patient, obtaining history from patient or surrogate, ordering and performing treatments and interventions, ordering and review of laboratory studies, ordering and review of radiographic studies, pulse oximetry and re-evaluation of patient's condition.   Marland Kitchen1-3 Lead EKG Interpretation  Performed by: Elyssia Strausser, Layla Maw, DO Authorized by: Arya Boxley, Layla Maw, DO     Interpretation: normal     ECG rate:  85   ECG rate assessment: normal     Rhythm: sinus rhythm     Ectopy: none     Conduction: normal       IMPRESSION / MDM / ASSESSMENT AND PLAN / ED COURSE  I reviewed the triage vital signs and the nursing notes.    Patient here with complaints of weakness, constipation, nausea and vomiting, fall with back pain, palpitations.  The patient is on the cardiac monitor to evaluate for evidence of arrhythmia and/or significant heart rate changes.   DIFFERENTIAL DIAGNOSIS (includes but not limited to):   Anemia, electrolyte derangement, constipation, less likely bowel  obstruction given benign exam, dehydration, UTI, lumbar fracture   Patient's presentation is most consistent with acute presentation with potential threat to life or bodily function.   PLAN: Workup initiated from triage.  Patient has a potassium of 2.8 and a magnesium of 1.2.  Will give IV and oral replacement and keep on a cardiac monitor.  EKG shows a paced rhythm.  No interval changes.  X-ray of the abdomen obtained from triage and reviewed/interpreted by myself and the radiologist and shows constipation without bowel obstruction.  Abdominal exam is benign.  It appears per her recent PCP note he recommended that she get a smog enema.  This has been ordered.  She also reports feeling weak and falling injuring her back.  No focal neurodeficits.  Will obtain x-ray of the lumbar spine and give Toradol for pain control.  Will avoid narcotics as this could worsen her constipation.   MEDICATIONS GIVEN IN ED: Medications  potassium chloride 10 mEq in 100 mL IVPB (10 mEq Intravenous New Bag/Given 03/13/24 0503)  ondansetron (ZOFRAN) injection 4 mg (4 mg Intravenous Not Given 03/13/24 0319)  sorbitol, magnesium hydroxide, mineral oil, glycerin (SMOG) enema (has no administration in time range)  potassium chloride SA (KLOR-CON M) CR tablet 40 mEq (40 mEq Oral Given 03/13/24 0321)  magnesium sulfate IVPB 2 g 50 mL (0 g Intravenous Stopped 03/13/24 0432)  sodium chloride 0.9 % bolus 1,000 mL (0 mLs Intravenous Stopped 03/13/24 0316)  ketorolac (TORADOL) 30 MG/ML injection 30 mg (30 mg Intravenous Given 03/13/24 0307)  sucralfate (CARAFATE) tablet 1 g (1 g Oral Given 03/13/24 0459)     ED COURSE: X-ray of the lumbar spine reviewed and interpreted by myself and the radiologist and shows no acute abnormality.  Smog enema has been ordered per recommendations from PCP.  Patient updated due to delay as this has to come from pharmacy.   CONSULTS:  Consulted and discussed patient's case with hospitalist, Dr. Para March.   I have recommended admission and consulting physician agrees and will place admission orders.  Patient (and family if present) agree with this plan.   I reviewed all nursing notes, vitals, pertinent previous records.  All labs, EKGs, imaging ordered have been independently reviewed and interpreted by myself.    OUTSIDE RECORDS REVIEWED: Reviewed recent PCP note.       FINAL CLINICAL IMPRESSION(S) / ED DIAGNOSES   Final diagnoses:  Hypokalemia  Hypomagnesemia  Palpitations  Fall, initial encounter  Constipation, unspecified constipation type     Rx / DC Orders   ED Discharge Orders     None        Note:  This document was prepared using Dragon voice recognition software and may include unintentional dictation errors.   Shadonna Benedick, Layla Maw, DO 03/13/24 [phone]

## 2024-03-13 NOTE — Assessment & Plan Note (Signed)
 Status post pacemaker placement.

## 2024-03-14 DIAGNOSIS — E876 Hypokalemia: Secondary | ICD-10-CM | POA: Diagnosis not present

## 2024-03-14 LAB — COMPREHENSIVE METABOLIC PANEL WITH GFR
ALT: 35 U/L (ref 0–44)
AST: 42 U/L — ABNORMAL HIGH (ref 15–41)
Albumin: 3.3 g/dL — ABNORMAL LOW (ref 3.5–5.0)
Alkaline Phosphatase: 80 U/L (ref 38–126)
Anion gap: 13 (ref 5–15)
BUN: 19 mg/dL (ref 6–20)
CO2: 23 mmol/L (ref 22–32)
Calcium: 8.4 mg/dL — ABNORMAL LOW (ref 8.9–10.3)
Chloride: 98 mmol/L (ref 98–111)
Creatinine, Ser: 0.76 mg/dL (ref 0.44–1.00)
GFR, Estimated: >60 mL/min (ref >60)
Glucose, Bld: 93 mg/dL (ref 70–99)
Potassium: 2.9 mmol/L — ABNORMAL LOW (ref 3.5–5.1)
Sodium: 134 mmol/L — ABNORMAL LOW (ref 135–145)
Total Bilirubin: 1.1 mg/dL (ref 0.0–1.2)
Total Protein: 6 g/dL — ABNORMAL LOW (ref 6.5–8.1)

## 2024-03-14 LAB — CBC
HCT: 31 % — ABNORMAL LOW (ref 36.0–46.0)
Hemoglobin: 10.7 g/dL — ABNORMAL LOW (ref 12.0–15.0)
MCH: 26.6 pg (ref 26.0–34.0)
MCHC: 34.5 g/dL (ref 30.0–36.0)
MCV: 77.1 fL — ABNORMAL LOW (ref 80.0–100.0)
Platelets: 145 10*3/uL — ABNORMAL LOW (ref 150–400)
RBC: 4.02 MIL/uL (ref 3.87–5.11)
RDW: 15 % (ref 11.5–15.5)
WBC: 6.2 10*3/uL (ref 4.0–10.5)
nRBC: 0 % (ref 0.0–0.2)

## 2024-03-14 MED ORDER — POTASSIUM CHLORIDE CRYS ER 20 MEQ PO TBCR
40.0000 meq | EXTENDED_RELEASE_TABLET | ORAL | Status: DC
  Filled 2024-03-14: qty 2

## 2024-03-14 MED ORDER — POTASSIUM CHLORIDE 10 MEQ/100ML IV SOLN
10.0000 meq | INTRAVENOUS | Status: AC
  Administered 2024-03-14 (×6): 10 meq via INTRAVENOUS
  Filled 2024-03-14 (×6): qty 100

## 2024-03-14 MED ORDER — SODIUM CHLORIDE 0.9 % IV SOLN: INTRAVENOUS | Status: AC

## 2024-03-14 NOTE — Plan of Care (Signed)
  Problem: Coping: Goal: Level of anxiety will decrease Outcome: Progressing   Problem: Pain Managment: Goal: General experience of comfort will improve and/or be controlled Outcome: Progressing   Problem: Safety: Goal: Ability to remain free from injury will improve Outcome: Progressing   Problem: Skin Integrity: Goal: Risk for impaired skin integrity will decrease Outcome: Progressing

## 2024-03-14 NOTE — Care Management Obs Status (Signed)
 MEDICARE OBSERVATION STATUS NOTIFICATION   Patient Details  Name: [name] MRN: [id number] Date of Birth: [birth year]   Medicare Observation Status Notification Given:  Orland Dec, CMA 03/14/2024, 10:00 AM

## 2024-03-14 NOTE — Plan of Care (Signed)
  Problem: Clinical Measurements: Goal: Ability to maintain clinical measurements within normal limits will improve Outcome: Not Progressing Goal: Respiratory complications will improve Outcome: Adequate for Discharge Goal: Cardiovascular complication will be avoided Outcome: Adequate for Discharge

## 2024-03-14 NOTE — Consult Note (Signed)
 PHARMACY CONSULT NOTE - FOLLOW UP  Pharmacy Consult for Electrolyte Monitoring and Replacement   Recent Labs: Potassium (mmol/L)  Date Value  03/14/2024 2.9 (L)  01/19/2015 3.4 (L)   Magnesium (mg/dL)  Date Value  40/98/1191 4.4 (H)  01/19/2015 1.0 (L)   Calcium (mg/dL)  Date Value  47/82/9562 8.4 (L)   Calcium, Total (mg/dL)  Date Value  13/07/6577 8.3 (L)   Albumin (g/dL)  Date Value  46/96/2952 3.3 (L)  06/14/2014 4.0   Phosphorus (mg/dL)  Date Value  84/13/2440 2.7   Sodium (mmol/L)  Date Value  03/14/2024 134 (L)  01/19/2015 142   Assessment: EW is a 43 yo female who presented to the emergency department due to palpitations and states that her Mg levels are low. They report feeling weak and states she has lost her balance and fell several days ago injuring her back. They also have had nausea and vomiting taking Zofran and Phenergan. 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 Pharmacy has been consulted to manage this patient's electrolytes.  Goal of Therapy:  Electrolytes WNL  Relevant medication:  Kcl po BID  Plan:  K 2.9 after replacement given 4/1. Will order Kcl po x 1 dose , additional to scheduled dose. Mg 4.4 on repeat lab done 4/1 @2300 , no replacement needed at this time. Repeat renal function panel and Mg with AM labs  Branston Halsted Rodriguez-Guzman PharmD, BCPS 03/14/2024 8:45 AM

## 2024-03-14 NOTE — Progress Notes (Signed)
 Transition of Care [facility]) - Inpatient Brief Assessment   Patient Details  Name: [name] MRN: [id number] Date of Birth: [birth year]  [facility] CM/SW Contact:    Truddie Hidden, RN Phone Number: 03/14/2024, 9:58 AM   Clinical Narrative: TOC continuing to follow patient's progress throughout discharge planning.   Transition of Care Asessment: Insurance and Status: Insurance coverage has been reviewed Patient has primary care physician: Yes   Prior level of function:: Independent Prior/Current Home Services: No current home services Social Drivers of Health Review: SDOH reviewed no interventions necessary Readmission risk has been reviewed: Yes Transition of care needs: no transition of care needs at this time

## 2024-03-14 NOTE — Progress Notes (Signed)
 PROGRESS NOTE    [name]  ONG:[id number] DOB: 04/11/1981 DOA: 03/13/2024 PCP: Dione Housekeeper, MD    Assessment & Plan:   Principal Problem:   Electrolyte abnormality Active Problems:   Ground-level fall   GERD without esophagitis   Back pain   Constipation   DVT (deep venous thrombosis) (HCC)   Bladder spasms   Hypotension   SA node dysfunction (HCC)  Assessment and Plan: Gitleman syndrome: recurrent hypokalemia & hypomagnesemia. Potassium given   Ground-level fall: x 2 home in setting of cerebral palsy and spastic quadriparesis. Fall precautions.  Hx of cerebral palsy: w/ spastic quadriparesis. 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 Will continue to monitor     DVT prophylaxis: eliquis  Code Status: full  Family Communication:  Disposition Plan: likely d/c back home.  Level of care: Progressive  Status is: Observation The patient remains OBS appropriate and will d/c before 2 midnights.    Consultants:    Procedures:  Antimicrobials:    Subjective: Pt c/o nausea & vomiting   Objective: Vitals:   03/13/24 1945 03/14/24 0028 03/14/24 0358 03/14/24 0941  BP: 117/66 112/72 117/79 103/73  Pulse: 90 85 94 85  Resp: 18 18 18 16   Temp: 97.9 F (36.6 C) 97.6 F (36.4 C) 98.4 F (36.9 C) 97.8 F (36.6 C)  TempSrc:      SpO2: 100% 100% 100% 100%  Weight:      Height:        Intake/Output Summary (Last 24 hours) at 03/14/2024 1057 Last data filed at 03/14/2024 1031 Gross per 24 hour  Intake 100 ml  Output 300 ml  Net -200 ml   Filed Weights   03/12/24 2105 03/13/24 1719  Weight: 39.1 kg 42.6 kg     Examination:  General exam: Appears calm but uncomfortable  Respiratory system: Clear to auscultation. Respiratory effort normal. Cardiovascular system: S1 & S2 +. No rubs, gallops or clicks.  Gastrointestinal system: Abdomen is nondistended, soft and nontender. Normal bowel sounds heard. Central nervous system: Alert and awake Psychiatry: Judgement and insight appears at baseline     Data Reviewed: I have personally reviewed following labs and imaging studies  CBC: Recent Labs  Lab 03/12/24 2109 03/14/24 0425  WBC 7.5 6.2  NEUTROABS 5.4  --   HGB 11.6* 10.7*  HCT 35.2* 31.0*  MCV 79.6* 77.1*  PLT 219 145*   Basic Metabolic Panel: Recent Labs  Lab 03/12/24 2109 03/13/24 1301 03/13/24 2300 03/14/24 0425  NA 141 140 134* 134*  K 2.8* 3.0* 2.9* 2.9*  CL 104 106 99 98  CO2 26 25 23 23   GLUCOSE 113* 110* 112* 93  BUN 18 17 19 19   CREATININE 0.86 0.84 0.72 0.76  CALCIUM 9.5 8.7* 8.4* 8.4*  MG 1.2* 1.3* 4.4*  --    GFR: Estimated Creatinine Clearance: 62.2 mL/min (by C-G formula based on SCr of 0.76 mg/dL). Liver Function Tests: Recent Labs  Lab 03/12/24 2109 03/13/24 1301 03/14/24 0425  AST 18 23 42*  ALT 14 18 35  ALKPHOS 75 72 80  BILITOT 0.5 0.8 1.1  PROT 6.8 5.9* 6.0*  ALBUMIN 4.0 3.3* 3.3*   Recent Labs  Lab 03/13/24 0254  LIPASE 26   No results for input(s): "AMMONIA" in the last 168 hours. Coagulation Profile: No results for input(s): "INR", "PROTIME" in the last 168 hours. Cardiac Enzymes: No results for input(s): "CKTOTAL", "CKMB", "CKMBINDEX", "TROPONINI" in the last 168 hours. BNP (last 3 results) No results for input(s): "PROBNP" in the last 8760 hours. HbA1C: No results for input(s): "HGBA1C" in the last 72 hours. CBG: No results for input(s): "GLUCAP" in the last 168 hours. Lipid Profile: No results for input(s): "CHOL", "HDL", "LDLCALC", "TRIG", "CHOLHDL", "LDLDIRECT" in the last 72 hours. Thyroid Function Tests: No results  for input(s): "TSH", "T4TOTAL", "FREET4", "T3FREE", "THYROIDAB" in the last 72 hours. Anemia Panel: No results for input(s): "VITAMINB12", "FOLATE", "FERRITIN", "TIBC", "IRON", "RETICCTPCT" in the last 72 hours. Sepsis Labs: No results for input(s): "PROCALCITON", "LATICACIDVEN" in the last 168 hours.  No results found for this or any previous visit (from the past 240 hours).       Radiology Studies: DG Lumbar Spine Complete Result Date: 03/13/2024 CLINICAL DATA:  Fall with back pain for 2 months. EXAM: LUMBAR SPINE - COMPLETE 4+ VIEW COMPARISON:  Lumbar spine CT 11/10/2023 FINDINGS: No evidence for an acute fracture. Degenerative disc disease noted T12-L1, L1-2, L2-3, similar to previous CT study. SI joints unremarkable. Battery pack for stimulator device overlies the right abdomen. Large stool volume noted throughout the colon. IMPRESSION: 1. No acute bony abnormality. Electronically Signed   By: Kennith Center [name]   On: 03/13/2024 05:18   DG Abdomen Acute W/Chest Result Date: 03/12/2024 CLINICAL DATA:  Constipation EXAM: DG ABDOMEN ACUTE WITH 1 VIEW CHEST COMPARISON:  09/26/2023 FINDINGS: Lungs are clear. No pneumothorax or pleural effusion. Cardiac size within normal limits. Pulmonary vascularity is normal. Right internal jugular chest port tip is seen within the right atrium. Left subclavian single lead pacemaker tip is seen within the expected right atrium. Epicardial pacer leads overlie the right atrium. Nonobstructive bowel gas pattern. Large volume stool seen throughout the colon. No free intraperitoneal gas. No organomegaly. No acute bone abnormality. IMPRESSION: 1. No active disease. 2. Large volume stool throughout the colon. Electronically Signed   By: Helyn Numbers [name]   On: 03/12/2024 21:31        Scheduled Meds:  apixaban  5 mg Oral BID   methylphenidate  20 mg Oral 3 times per day   midodrine  10 mg Oral TID WC   ondansetron (ZOFRAN) IV  4 mg Intravenous Once    pantoprazole  40 mg Oral Daily   potassium chloride SA  20 mEq Oral BID   potassium chloride  40 mEq Oral NOW   pregabalin  150 mg Oral BID   SMOG  960 mL Rectal Once   sucralfate  1 g Oral QID   Continuous Infusions:  promethazine (PHENERGAN) injection (IM or IVPB)       LOS: 0 days     Charise Killian, MD [facility] Pager [phone]  If 7PM-7AM, please contact night-coverage [email or web address] 03/14/2024, 10:57 AM

## 2024-03-15 DIAGNOSIS — Z7901 Long term (current) use of anticoagulants: Secondary | ICD-10-CM | POA: Diagnosis not present

## 2024-03-15 DIAGNOSIS — Z5329 Procedure and treatment not carried out because of patient's decision for other reasons: Secondary | ICD-10-CM | POA: Diagnosis present

## 2024-03-15 DIAGNOSIS — N3289 Other specified disorders of bladder: Secondary | ICD-10-CM | POA: Diagnosis present

## 2024-03-15 DIAGNOSIS — E876 Hypokalemia: Secondary | ICD-10-CM | POA: Diagnosis present

## 2024-03-15 DIAGNOSIS — Z950 Presence of cardiac pacemaker: Secondary | ICD-10-CM | POA: Diagnosis not present

## 2024-03-15 DIAGNOSIS — Z882 Allergy status to sulfonamides status: Secondary | ICD-10-CM | POA: Diagnosis not present

## 2024-03-15 DIAGNOSIS — N158 Other specified renal tubulo-interstitial diseases: Secondary | ICD-10-CM | POA: Diagnosis present

## 2024-03-15 DIAGNOSIS — D696 Thrombocytopenia, unspecified: Secondary | ICD-10-CM | POA: Diagnosis present

## 2024-03-15 DIAGNOSIS — Z86718 Personal history of other venous thrombosis and embolism: Secondary | ICD-10-CM | POA: Diagnosis not present

## 2024-03-15 DIAGNOSIS — R002 Palpitations: Secondary | ICD-10-CM | POA: Diagnosis present

## 2024-03-15 DIAGNOSIS — G8929 Other chronic pain: Secondary | ICD-10-CM | POA: Diagnosis present

## 2024-03-15 DIAGNOSIS — Z8249 Family history of ischemic heart disease and other diseases of the circulatory system: Secondary | ICD-10-CM | POA: Diagnosis not present

## 2024-03-15 DIAGNOSIS — W1830XA Fall on same level, unspecified, initial encounter: Secondary | ICD-10-CM | POA: Diagnosis present

## 2024-03-15 DIAGNOSIS — G800 Spastic quadriplegic cerebral palsy: Secondary | ICD-10-CM | POA: Diagnosis present

## 2024-03-15 DIAGNOSIS — Y92009 Unspecified place in unspecified non-institutional (private) residence as the place of occurrence of the external cause: Secondary | ICD-10-CM | POA: Diagnosis not present

## 2024-03-15 DIAGNOSIS — Z79899 Other long term (current) drug therapy: Secondary | ICD-10-CM | POA: Diagnosis not present

## 2024-03-15 DIAGNOSIS — I959 Hypotension, unspecified: Secondary | ICD-10-CM | POA: Diagnosis present

## 2024-03-15 DIAGNOSIS — M549 Dorsalgia, unspecified: Secondary | ICD-10-CM | POA: Diagnosis present

## 2024-03-15 DIAGNOSIS — G90A Postural orthostatic tachycardia syndrome (POTS): Secondary | ICD-10-CM | POA: Diagnosis present

## 2024-03-15 DIAGNOSIS — Z833 Family history of diabetes mellitus: Secondary | ICD-10-CM | POA: Diagnosis not present

## 2024-03-15 DIAGNOSIS — K5900 Constipation, unspecified: Secondary | ICD-10-CM | POA: Diagnosis present

## 2024-03-15 DIAGNOSIS — K219 Gastro-esophageal reflux disease without esophagitis: Secondary | ICD-10-CM | POA: Diagnosis present

## 2024-03-15 DIAGNOSIS — E878 Other disorders of electrolyte and fluid balance, not elsewhere classified: Secondary | ICD-10-CM | POA: Diagnosis present

## 2024-03-15 LAB — BASIC METABOLIC PANEL WITH GFR
Anion gap: 12 (ref 5–15)
BUN: 16 mg/dL (ref 6–20)
CO2: 24 mmol/L (ref 22–32)
Calcium: 8.7 mg/dL — ABNORMAL LOW (ref 8.9–10.3)
Chloride: 102 mmol/L (ref 98–111)
Creatinine, Ser: 0.78 mg/dL (ref 0.44–1.00)
GFR, Estimated: >60 mL/min (ref >60)
Glucose, Bld: 79 mg/dL (ref 70–99)
Potassium: 3.2 mmol/L — ABNORMAL LOW (ref 3.5–5.1)
Sodium: 138 mmol/L (ref 135–145)

## 2024-03-15 LAB — CBC
HCT: 31.4 % — ABNORMAL LOW (ref 36.0–46.0)
Hemoglobin: 10.5 g/dL — ABNORMAL LOW (ref 12.0–15.0)
MCH: 25.6 pg — ABNORMAL LOW (ref 26.0–34.0)
MCHC: 33.4 g/dL (ref 30.0–36.0)
MCV: 76.6 fL — ABNORMAL LOW (ref 80.0–100.0)
Platelets: 141 10*3/uL — ABNORMAL LOW (ref 150–400)
RBC: 4.1 MIL/uL (ref 3.87–5.11)
RDW: 14.6 % (ref 11.5–15.5)
WBC: 4.2 10*3/uL (ref 4.0–10.5)
nRBC: 0 % (ref 0.0–0.2)

## 2024-03-15 LAB — MAGNESIUM: Magnesium: 1.1 mg/dL — ABNORMAL LOW (ref 1.7–2.4)

## 2024-03-15 MED ORDER — MAGNESIUM SULFATE 4 GM/100ML IV SOLN
4.0000 g | Freq: Once | INTRAVENOUS | Status: AC
  Administered 2024-03-15: 4 g via INTRAVENOUS
  Filled 2024-03-15: qty 100

## 2024-03-15 MED ORDER — CHLORHEXIDINE GLUCONATE CLOTH 2 % EX PADS
6.0000 | MEDICATED_PAD | Freq: Every day | CUTANEOUS | Status: DC
  Administered 2024-03-15: 6 via TOPICAL

## 2024-03-15 MED ORDER — SODIUM CHLORIDE 0.9% FLUSH
10.0000 mL | INTRAVENOUS | Status: DC | PRN
  Administered 2024-03-15: 10 mL

## 2024-03-15 MED ORDER — SODIUM CHLORIDE 0.9% FLUSH
10.0000 mL | Freq: Two times a day (BID) | INTRAVENOUS | Status: DC
  Administered 2024-03-15: 10 mL

## 2024-03-15 MED ORDER — POTASSIUM CHLORIDE CRYS ER 20 MEQ PO TBCR
40.0000 meq | EXTENDED_RELEASE_TABLET | Freq: Two times a day (BID) | ORAL | Status: DC
  Administered 2024-03-15: 40 meq via ORAL
  Filled 2024-03-15: qty 2

## 2024-03-15 MED ORDER — HEPARIN SOD (PORK) LOCK FLUSH 100 UNIT/ML IV SOLN
500.0000 [IU] | INTRAVENOUS | Status: AC | PRN
  Administered 2024-03-15: 500 [IU]

## 2024-03-15 NOTE — Consult Note (Signed)
 PHARMACY CONSULT NOTE - FOLLOW UP  Pharmacy Consult for Electrolyte Monitoring and Replacement   Recent Labs: Potassium (mmol/L)  Date Value  03/15/2024 3.2 (L)  01/19/2015 3.4 (L)   Magnesium (mg/dL)  Date Value  16/09/9603 1.1 (L)  01/19/2015 1.0 (L)   Calcium (mg/dL)  Date Value  54/08/8118 8.7 (L)   Calcium, Total (mg/dL)  Date Value  14/78/2956 8.3 (L)   Albumin (g/dL)  Date Value  21/30/8657 3.3 (L)  06/14/2014 4.0   Phosphorus (mg/dL)  Date Value  84/69/6295 2.7   Sodium (mmol/L)  Date Value  03/15/2024 138  01/19/2015 142   Assessment: EW is a 43 yo female who presented to the emergency department due to palpitations and states that her Mg levels are low. They report feeling weak and states she has lost her balance and fell several days ago injuring her back. They also have had nausea and vomiting taking Zofran and Phenergan. 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 Pharmacy has been consulted to manage this patient's electrolytes.  Goal of Therapy:  Electrolytes WNL  Relevant medication: N/A  Plan:  K 3.2, will give Kcl po BID x 2 doses. Mg 1.1 will replace with MgSulfate 4gm IV x 1 dose. Repeat renal function panel and Mg with AM labs.  Francenia Chimenti Rodriguez-Guzman PharmD, BCPS 03/15/2024 7:21 AM

## 2024-03-15 NOTE — Progress Notes (Signed)
 Spoke with patients mom concerning patients care. Mom was very emotional and crying expressing how she was upset she could not be at bedside due to her own health condition. I ensured mom that we were doing our best to communicate with patient and would continue to provide care. Spoke with her for about about 10 minutes during this time.

## 2024-03-15 NOTE — Progress Notes (Signed)
 Patient's mother has called multiple times venting about how she wants her daughter home but cannot come to pick up patient.  She's stated she can't get help from anyone here at the hospital or in the community.  Multiple staff has informed the mother that the doctor does not feel a discharge is appropriate at this time and would be leaving against medical advice were she to arrange transportation.  We also informed her as well that the patient that she had the right to leave if she chose.    Patient has pulled her telemetry lead wires off and is refusing to have them placed.  Telemetry monitoring placed on standby for now.

## 2024-03-15 NOTE — Progress Notes (Signed)
 Received phone call from house supervisor stating pt is leaving AMA; requested to de-access port.

## 2024-03-15 NOTE — Progress Notes (Signed)
 PROGRESS NOTE    [name]  ZOX:[id number] DOB: 1981-04-24 DOA: 03/13/2024 PCP: Dione Housekeeper, MD    Assessment & Plan:   Principal Problem:   Electrolyte abnormality Active Problems:   Ground-level fall   GERD without esophagitis   Back pain   Constipation   DVT (deep venous thrombosis) (HCC)   Bladder spasms   Hypotension   SA node dysfunction (HCC)   Gitelman syndrome  Assessment and Plan: Gitleman syndrome: recurrent hypokalemia & hypomagnesemia. Potassium & magnesium given. Will need to d/c w/ potassium & magnesium po at d/c   Ground-level fall: x 2 home in setting of cerebral palsy and spastic quadriparesis. Fall precautions.  Hx of cerebral palsy: w/ spastic quadriparesis. Continue w/ supportive care   GERD: continue on PPI, carafate   SA node dysfunction: s/p pacemaker placement    Hypotension: continue on home dose of midodrine    Bladder spasms: continue w/ supportive care    DVT: chronic. Continue on eliquis    Constipation: continue on milk of mag prn   Chronic back pain: continue on home dose of pregabalin   Thrombocytopenia: etiology unclear. Labile. Will continue to monitor     DVT prophylaxis: eliquis  Code Status: full  Family Communication:  Disposition Plan: likely d/c back home.  Level of care: Progressive  Status is: Inpatient Remains inpatient appropriate because: severity of illness     Consultants:    Procedures:  Antimicrobials:    Subjective: Pt c/o malaise   Objective: Vitals:   03/14/24 1600 03/14/24 1956 03/15/24 0005 03/15/24 0432  BP: 115/72 (!) 113/97 102/60 (!) 124/90  Pulse: 85 85 86 86  Resp:  18 18   Temp: 98.1 F (36.7 C) 98.5 F (36.9 C) 98 F (36.7 C) 97.7 F (36.5 C)  TempSrc: Oral   Oral  SpO2: 100% 100% 100% 100%  Weight:      Height:        Intake/Output Summary (Last 24 hours) at 03/15/2024 1352 Last data filed at 03/15/2024 0900 Gross per 24 hour  Intake 1259.83 ml  Output  --  Net 1259.83 ml   Filed Weights   03/12/24 2105 03/13/24 1719  Weight: 39.1 kg 42.6 kg    Examination:  General exam: Appears calm  Respiratory system: clear breath sounds b/l  Cardiovascular system: S1/S2+. No rubs or gallops  Gastrointestinal system: Abd is soft, ND, NT & hypoactive bowel sounds Central nervous system: alert & awake Psychiatry: judgement and insight appears at baseline    Data Reviewed: I have personally reviewed following labs and imaging studies  CBC: Recent Labs  Lab 03/12/24 2109 03/14/24 0425 03/15/24 0519  WBC 7.5 6.2 4.2  NEUTROABS 5.4  --   --   HGB 11.6* 10.7* 10.5*  HCT 35.2* 31.0* 31.4*  MCV 79.6* 77.1* 76.6*  PLT 219 145* 141*   Basic Metabolic Panel: Recent Labs  Lab 03/12/24 2109 03/13/24 1301 03/13/24 2300 03/14/24 0425 03/15/24 0519  NA 141 140 134* 134* 138  K 2.8* 3.0* 2.9* 2.9* 3.2*  CL 104 106 99 98 102  CO2 26 25 23 23 24   GLUCOSE 113* 110* 112* 93 79  BUN 18 17 19 19 16   CREATININE 0.86 0.84 0.72 0.76 0.78  CALCIUM 9.5 8.7* 8.4* 8.4* 8.7*  MG 1.2* 1.3* 4.4*  --  1.1*   GFR: Estimated Creatinine Clearance: 62.2 mL/min (by C-G formula based on SCr of 0.78 mg/dL). Liver Function Tests: Recent Labs  Lab 03/12/24  2109 03/13/24 1301 03/14/24 0425  AST 18 23 42*  ALT 14 18 35  ALKPHOS 75 72 80  BILITOT 0.5 0.8 1.1  PROT 6.8 5.9* 6.0*  ALBUMIN 4.0 3.3* 3.3*   Recent Labs  Lab 03/13/24 0254  LIPASE 26   No results for input(s): "AMMONIA" in the last 168 hours. Coagulation Profile: No results for input(s): "INR", "PROTIME" in the last 168 hours. Cardiac Enzymes: No results for input(s): "CKTOTAL", "CKMB", "CKMBINDEX", "TROPONINI" in the last 168 hours. BNP (last 3 results) No results for input(s): "PROBNP" in the last 8760 hours. HbA1C: No results for input(s): "HGBA1C" in the last 72 hours. CBG: No results for input(s): "GLUCAP" in the last 168 hours. Lipid Profile: No results for input(s): "CHOL",  "HDL", "LDLCALC", "TRIG", "CHOLHDL", "LDLDIRECT" in the last 72 hours. Thyroid Function Tests: No results for input(s): "TSH", "T4TOTAL", "FREET4", "T3FREE", "THYROIDAB" in the last 72 hours. Anemia Panel: No results for input(s): "VITAMINB12", "FOLATE", "FERRITIN", "TIBC", "IRON", "RETICCTPCT" in the last 72 hours. Sepsis Labs: No results for input(s): "PROCALCITON", "LATICACIDVEN" in the last 168 hours.  No results found for this or any previous visit (from the past 240 hours).       Radiology Studies: No results found.       Scheduled Meds:  apixaban  5 mg Oral BID   Chlorhexidine Gluconate Cloth  6 each Topical Daily   methylphenidate  20 mg Oral 3 times per day   midodrine  10 mg Oral TID WC   ondansetron (ZOFRAN) IV  4 mg Intravenous Once   pantoprazole  40 mg Oral Daily   potassium chloride  40 mEq Oral BID   pregabalin  150 mg Oral BID   sodium chloride flush  10-40 mL Intracatheter Q12H   SMOG  960 mL Rectal Once   sucralfate  1 g Oral QID   Continuous Infusions:  promethazine (PHENERGAN) injection (IM or IVPB)       LOS: 0 days     Charise Killian, MD [facility] Pager [phone]  If 7PM-7AM, please contact night-coverage [email or web address] 03/15/2024, 1:52 PM

## 2024-03-15 NOTE — Progress Notes (Signed)
   03/15/24 1300  Spiritual Encounters  Type of Visit Initial  Care provided to: Pt not available  Referral source Other (comment) Nurse, mental health)  Reason for visit Routine spiritual support  OnCall Visit Yes   Chaplain received page from administration desk to call the mother of patient.  Staff reported Mother was crying and upset her daughter was still hospitalized and ready for her to come home.  Staff was impacted by the emotional phone call and confirmed with patient the Chaplain would call her back. Chaplain called phone number provided [phone].  No answer.  Left messages with name and reason for call.  No further calls to be made at this time.  Chaplain spiritual support services remain available as the need arises.

## 2024-03-15 NOTE — Progress Notes (Addendum)
 Patient left AMA, new paperwork signed again. On call provider put order in to deaccess port. Port de accessed. Patient picked up by Charlie H. Confirmed via phone call with patient's mom. Belongings sent with patient.

## 2024-03-15 NOTE — Plan of Care (Signed)
  Problem: Health Behavior/Discharge Planning: Goal: Ability to manage health-related needs will improve Outcome: Progressing   Problem: Clinical Measurements: Goal: Cardiovascular complication will be avoided Outcome: Progressing   Problem: Activity: Goal: Risk for activity intolerance will decrease Outcome: Progressing   Problem: Coping: Goal: Level of anxiety will decrease Outcome: Progressing

## 2024-03-16 NOTE — Discharge Summary (Signed)
 Physician Discharge Summary  [name] GMW:[id number] DOB: Dec 14, 1980 DOA: 03/13/2024  PCP: Caitlyn Housekeeper, MD  Admit date: 03/13/2024 Discharge date: 03/16/2024  Admitted From: home  Disposition:  Pt left AMA  Recommendations for Outpatient Follow-up:  Pt left AMA  Home Health: no  Equipment/Devices:  Discharge Condition: guarded  CODE STATUS: full  Diet recommendation:  Brief/Interim Summary: HPI was taken from Dr. Alvester Morin: [name] is a 43 y.o. 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  Patient reports being transferred to the ER secondary to low magnesium levels at PCP office.  This has been a chronic issue.  Noted admission November 2020 for similar issues associated with patient not getting IV electrolyte infusions.Pt was supposed to be getting outpatient electrolyte infusions. Unsure if this being done.  Patient also reports ground-level falls at home with back spasm.  This has been a chronic issue in the setting of cerebral palsy with spastic quadriparesis.  No head trauma loss of consciousness.  No chest pain.  No fevers or chills.  Has had some constipation.  No reported shortness of breath. Presented to the ER afebrile, hemodynamically stable.  Satting well on room air.  White count 7.5, hemoglobin 11.6, platelets 219, urinalysis not indicative of infection.  Creatinine 0.6.  Potassium 2.8.  Magnesium 1.2.  L-spine plain films within normal limits.  KUB and chest x-ray stable.  Pt decided to leave AMA on the night of 03/15/24 because she was not happy in the hospital as per pt's mother.   Discharge Diagnoses:  Principal Problem:   Electrolyte abnormality Active Problems:   Ground-level fall   GERD without esophagitis   Back pain    Constipation   DVT (deep venous thrombosis) (HCC)   Bladder spasms   Hypotension   SA node dysfunction (HCC)   Gitelman syndrome  Gitleman syndrome: recurrent hypokalemia & hypomagnesemia. Potassium & magnesium given. Will need to d/c w/ potassium & magnesium po at d/c    Ground-level fall: x 2 home in setting of cerebral palsy and spastic quadriparesis. Fall precautions.   Hx of cerebral palsy: w/ spastic quadriparesis. Continue w/ supportive care   GERD: continue on PPI, carafate   SA node dysfunction: s/p pacemaker placement    Hypotension: continue on home dose of midodrine    Bladder spasms: continue w/ supportive care    DVT: chronic. Continue on eliquis    Constipation: continue on milk of mag prn    Chronic back pain: continue on home dose of pregabalin    Thrombocytopenia: etiology unclear. Labile. Will continue to monitor     Discharge Instructions  Pt left AMA   Allergies  Allergen Reactions   Duloxetine     Other reaction(s): Other (See Comments), Palpitations Other reaction(s): Hallucinations, Hyperactive behavior (finding), Other (See Comments) Stressed out     Flecainide     Other reaction(s): Other (See Comments) Other reaction(s): Hypomagnesemia (disorder), Other (See Comments) Other Reaction: QUESTION HYPOMAGNESEMIA     Fluoxetine     Other reaction(s): Nausea And Vomiting, Other (See Comments) Other reaction(s): Other (See Comments) Other Reaction: INCREASED HR  causes irritability and irrational behavior "I go crazy"   Gabapentin     Goes crazy Other reaction(s): Other (See Comments) Other reaction(s): Hyperactive behavior (finding), Mental Status Changes (intolerance), Other (See Comments) Goes  crazy Hallucinations   Baclofen     Other reaction(s): Other (See Comments), Other (See Comments) Weakness  Other reaction(s): Other (See Comments) Weakness and loose Weakness  Other reaction(s):  Weakness     Cephalosporins     Other  reaction(s): Hives, Rash Received pre-op vancomycin and cefazolin 07/20/16. Developed urticarial rash on bilateral lower extremities + slight periorbital swelling. Treated with diphenhydramine. Received pre-op vancomycin and cefazolin 07/20/16. Developed urticarial rash on bilateral lower extremities + slight periorbital swelling. Treated with diphenhydramine. Received pre-op vancomycin and cefazolin 07/20/16. Developed urticarial rash on bilateral lower extremities + slight periorbital swelling. Treated with diphenhydramine.   Sulfasalazine     Other reaction(s): Nausea And Vomiting   Sulfa Antibiotics Nausea And Vomiting   Vancomycin     Other reaction(s): Hives Received pre-op vancomycin and cefazolin 07/20/16. Developed urticarial rash on bilateral lower extremities + slight periorbital swelling. Treated with diphenhydramine. Received pre-op vancomycin and cefazolin 07/20/16. Developed urticarial rash on bilateral lower extremities + slight periorbital swelling. Treated with diphenhydramine. Received pre-op vancomycin and cefazolin 07/20/16. Developed urticarial rash on bilateral lower extremities + slight periorbital swelling. Treated with diphenhydramine.   Cefazolin Rash    Tolerated ceftriaxone 2023 and cephalexin 2022    Consultations:    Procedures/Studies: DG Lumbar Spine Complete Result Date: 03/13/2024 CLINICAL DATA:  Fall with back pain for 2 months. EXAM: LUMBAR SPINE - COMPLETE 4+ VIEW COMPARISON:  Lumbar spine CT 11/10/2023 FINDINGS: No evidence for an acute fracture. Degenerative disc disease noted T12-L1, L1-2, L2-3, similar to previous CT study. SI joints unremarkable. Battery pack for stimulator device overlies the right abdomen. Large stool volume noted throughout the colon. IMPRESSION: 1. No acute bony abnormality. Electronically Signed   By: Kennith Center [name]   On: 03/13/2024 05:18   DG Abdomen Acute W/Chest Result Date: 03/12/2024 CLINICAL DATA:  Constipation EXAM: DG ABDOMEN  ACUTE WITH 1 VIEW CHEST COMPARISON:  09/26/2023 FINDINGS: Lungs are clear. No pneumothorax or pleural effusion. Cardiac size within normal limits. Pulmonary vascularity is normal. Right internal jugular chest port tip is seen within the right atrium. Left subclavian single lead pacemaker tip is seen within the expected right atrium. Epicardial pacer leads overlie the right atrium. Nonobstructive bowel gas pattern. Large volume stool seen throughout the colon. No free intraperitoneal gas. No organomegaly. No acute bone abnormality. IMPRESSION: 1. No active disease. 2. Large volume stool throughout the colon. Electronically Signed   By: Helyn Numbers [name]   On: 03/12/2024 21:31   (Echo, Carotid, EGD, Colonoscopy, ERCP)    Subjective: pt c/o malaise   Discharge Exam: Vitals:   03/15/24 0432 03/15/24 2012  BP: (!) 124/90 (!) 118/92  Pulse: 86 86  Resp:    Temp: 97.7 F (36.5 C) 98 F (36.7 C)  SpO2: 100% 100%   Vitals:   03/14/24 1956 03/15/24 0005 03/15/24 0432 03/15/24 2012  BP: (!) 113/97 102/60 (!) 124/90 (!) 118/92  Pulse: 85 86 86 86  Resp: 18 18    Temp: 98.5 F (36.9 C) 98 F (36.7 C) 97.7 F (36.5 C) 98 F (36.7 C)  TempSrc:   Oral Oral  SpO2: 100% 100% 100% 100%  Weight:      Height:        General exam: Appears calm  Respiratory system: clear breath sounds b/l  Cardiovascular system: S1/S2+. No rubs or gallops  Gastrointestinal system: Abd is soft, ND, NT & hypoactive bowel sounds Central nervous system: alert & awake Psychiatry: judgement and insight  appears at baseline    The results of significant diagnostics from this hospitalization (including imaging, microbiology, ancillary and laboratory) are listed below for reference.     Microbiology: No results found for this or any previous visit (from the past 240 hours).   Labs: BNP (last 3 results) No results for input(s): "BNP" in the last 8760 hours. Basic Metabolic Panel: Recent Labs  Lab  03/12/24 2109 03/13/24 1301 03/13/24 2300 03/14/24 0425 03/15/24 0519  NA 141 140 134* 134* 138  K 2.8* 3.0* 2.9* 2.9* 3.2*  CL 104 106 99 98 102  CO2 26 25 23 23 24   GLUCOSE 113* 110* 112* 93 79  BUN 18 17 19 19 16   CREATININE 0.86 0.84 0.72 0.76 0.78  CALCIUM 9.5 8.7* 8.4* 8.4* 8.7*  MG 1.2* 1.3* 4.4*  --  1.1*   Liver Function Tests: Recent Labs  Lab 03/12/24 2109 03/13/24 1301 03/14/24 0425  AST 18 23 42*  ALT 14 18 35  ALKPHOS 75 72 80  BILITOT 0.5 0.8 1.1  PROT 6.8 5.9* 6.0*  ALBUMIN 4.0 3.3* 3.3*   Recent Labs  Lab 03/13/24 0254  LIPASE 26   No results for input(s): "AMMONIA" in the last 168 hours. CBC: Recent Labs  Lab 03/12/24 2109 03/14/24 0425 03/15/24 0519  WBC 7.5 6.2 4.2  NEUTROABS 5.4  --   --   HGB 11.6* 10.7* 10.5*  HCT 35.2* 31.0* 31.4*  MCV 79.6* 77.1* 76.6*  PLT 219 145* 141*   Cardiac Enzymes: No results for input(s): "CKTOTAL", "CKMB", "CKMBINDEX", "TROPONINI" in the last 168 hours. BNP: Invalid input(s): "POCBNP" CBG: No results for input(s): "GLUCAP" in the last 168 hours. D-Dimer No results for input(s): "DDIMER" in the last 72 hours. Hgb A1c No results for input(s): "HGBA1C" in the last 72 hours. Lipid Profile No results for input(s): "CHOL", "HDL", "LDLCALC", "TRIG", "CHOLHDL", "LDLDIRECT" in the last 72 hours. Thyroid function studies No results for input(s): "TSH", "T4TOTAL", "T3FREE", "THYROIDAB" in the last 72 hours.  Invalid input(s): "FREET3" Anemia work up No results for input(s): "VITAMINB12", "FOLATE", "FERRITIN", "TIBC", "IRON", "RETICCTPCT" in the last 72 hours. Urinalysis    Component Value Date/Time   COLORURINE YELLOW (A) 03/13/2024 0254   APPEARANCEUR CLEAR (A) 03/13/2024 0254   APPEARANCEUR Hazy 02/11/2014 1737   LABSPEC 1.020 03/13/2024 0254   LABSPEC 1.020 02/11/2014 1737   PHURINE 5.0 03/13/2024 0254   GLUCOSEU NEGATIVE 03/13/2024 0254   GLUCOSEU Negative 02/11/2014 1737   HGBUR SMALL (A)  03/13/2024 0254   BILIRUBINUR NEGATIVE 03/13/2024 0254   BILIRUBINUR Negative 02/11/2014 1737   KETONESUR NEGATIVE 03/13/2024 0254   PROTEINUR NEGATIVE 03/13/2024 0254   UROBILINOGEN 0.2 04/18/2014 1640   NITRITE NEGATIVE 03/13/2024 0254   LEUKOCYTESUR NEGATIVE 03/13/2024 0254   LEUKOCYTESUR Negative 02/11/2014 1737   Sepsis Labs Recent Labs  Lab 03/12/24 2109 03/14/24 0425 03/15/24 0519  WBC 7.5 6.2 4.2   Microbiology No results found for this or any previous visit (from the past 240 hours).   Time coordinating discharge: Over 30 minutes  SIGNED:   Charise Killian, MD  [facility] 03/16/2024, 8:00 AM Pager   If 7PM-7AM, please contact night-coverage [email or web address]

## 2024-03-29 DIAGNOSIS — Z7689 Persons encountering health services in other specified circumstances: Secondary | ICD-10-CM | POA: Diagnosis not present

## 2024-04-04 DIAGNOSIS — Z7689 Persons encountering health services in other specified circumstances: Secondary | ICD-10-CM | POA: Diagnosis not present

## 2024-04-05 DIAGNOSIS — Z7689 Persons encountering health services in other specified circumstances: Secondary | ICD-10-CM | POA: Diagnosis not present

## 2024-04-10 ENCOUNTER — Emergency Department: Payer: Medicare (Managed Care)

## 2024-04-10 ENCOUNTER — Inpatient Hospital Stay
Admission: EM | Admit: 2024-04-10 | Discharge: 2024-04-16 | DRG: 391 | Disposition: A | Discharge status: Home / Self Care | Payer: Medicare (Managed Care) | Attending: Student | Admitting: Student

## 2024-04-10 ENCOUNTER — Other Ambulatory Visit: Payer: Self-pay

## 2024-04-10 DIAGNOSIS — R0781 Pleurodynia: Secondary | ICD-10-CM | POA: Diagnosis present

## 2024-04-10 DIAGNOSIS — Z8249 Family history of ischemic heart disease and other diseases of the circulatory system: Secondary | ICD-10-CM

## 2024-04-10 DIAGNOSIS — R079 Chest pain, unspecified: Secondary | ICD-10-CM

## 2024-04-10 DIAGNOSIS — R1084 Generalized abdominal pain: Secondary | ICD-10-CM

## 2024-04-10 DIAGNOSIS — Z79899 Other long term (current) drug therapy: Secondary | ICD-10-CM

## 2024-04-10 DIAGNOSIS — G800 Spastic quadriplegic cerebral palsy: Secondary | ICD-10-CM | POA: Diagnosis present

## 2024-04-10 DIAGNOSIS — I959 Hypotension, unspecified: Secondary | ICD-10-CM | POA: Diagnosis not present

## 2024-04-10 DIAGNOSIS — E440 Moderate protein-calorie malnutrition: Secondary | ICD-10-CM | POA: Diagnosis present

## 2024-04-10 DIAGNOSIS — Z950 Presence of cardiac pacemaker: Secondary | ICD-10-CM | POA: Diagnosis not present

## 2024-04-10 DIAGNOSIS — I82409 Acute embolism and thrombosis of unspecified deep veins of unspecified lower extremity: Secondary | ICD-10-CM | POA: Diagnosis present

## 2024-04-10 DIAGNOSIS — Z681 Body mass index (BMI) 19 or less, adult: Secondary | ICD-10-CM | POA: Diagnosis not present

## 2024-04-10 DIAGNOSIS — R8281 Pyuria: Secondary | ICD-10-CM | POA: Diagnosis not present

## 2024-04-10 DIAGNOSIS — K279 Peptic ulcer, site unspecified, unspecified as acute or chronic, without hemorrhage or perforation: Secondary | ICD-10-CM | POA: Diagnosis present

## 2024-04-10 DIAGNOSIS — Z833 Family history of diabetes mellitus: Secondary | ICD-10-CM

## 2024-04-10 DIAGNOSIS — Z993 Dependence on wheelchair: Secondary | ICD-10-CM

## 2024-04-10 DIAGNOSIS — E878 Other disorders of electrolyte and fluid balance, not elsewhere classified: Secondary | ICD-10-CM | POA: Diagnosis present

## 2024-04-10 DIAGNOSIS — Z882 Allergy status to sulfonamides status: Secondary | ICD-10-CM

## 2024-04-10 DIAGNOSIS — M25551 Pain in right hip: Secondary | ICD-10-CM | POA: Diagnosis present

## 2024-04-10 DIAGNOSIS — Z888 Allergy status to other drugs, medicaments and biological substances status: Secondary | ICD-10-CM

## 2024-04-10 DIAGNOSIS — I10 Essential (primary) hypertension: Secondary | ICD-10-CM | POA: Diagnosis present

## 2024-04-10 DIAGNOSIS — K5900 Constipation, unspecified: Secondary | ICD-10-CM | POA: Diagnosis present

## 2024-04-10 DIAGNOSIS — Z90710 Acquired absence of both cervix and uterus: Secondary | ICD-10-CM | POA: Diagnosis not present

## 2024-04-10 DIAGNOSIS — K219 Gastro-esophageal reflux disease without esophagitis: Secondary | ICD-10-CM | POA: Diagnosis present

## 2024-04-10 DIAGNOSIS — Y92009 Unspecified place in unspecified non-institutional (private) residence as the place of occurrence of the external cause: Secondary | ICD-10-CM

## 2024-04-10 DIAGNOSIS — W19XXXA Unspecified fall, initial encounter: Secondary | ICD-10-CM | POA: Diagnosis present

## 2024-04-10 DIAGNOSIS — G809 Cerebral palsy, unspecified: Secondary | ICD-10-CM | POA: Diagnosis present

## 2024-04-10 DIAGNOSIS — R0789 Other chest pain: Secondary | ICD-10-CM | POA: Diagnosis not present

## 2024-04-10 DIAGNOSIS — E876 Hypokalemia: Secondary | ICD-10-CM | POA: Diagnosis present

## 2024-04-10 DIAGNOSIS — Z7901 Long term (current) use of anticoagulants: Secondary | ICD-10-CM

## 2024-04-10 DIAGNOSIS — Z86718 Personal history of other venous thrombosis and embolism: Secondary | ICD-10-CM | POA: Diagnosis not present

## 2024-04-10 DIAGNOSIS — G90A Postural orthostatic tachycardia syndrome (POTS): Secondary | ICD-10-CM | POA: Diagnosis present

## 2024-04-10 DIAGNOSIS — Z7689 Persons encountering health services in other specified circumstances: Secondary | ICD-10-CM | POA: Diagnosis not present

## 2024-04-10 HISTORY — DX: Postural orthostatic tachycardia syndrome (POTS): G90.A

## 2024-04-10 LAB — COMPREHENSIVE METABOLIC PANEL WITH GFR
ALT: 25 U/L (ref 0–44)
AST: 25 U/L (ref 15–41)
Albumin: 4.1 g/dL (ref 3.5–5.0)
Alkaline Phosphatase: 83 U/L (ref 38–126)
Anion gap: 13 (ref 5–15)
BUN: 18 mg/dL (ref 6–20)
CO2: 26 mmol/L (ref 22–32)
Calcium: 9.7 mg/dL (ref 8.9–10.3)
Chloride: 102 mmol/L (ref 98–111)
Creatinine, Ser: 0.85 mg/dL (ref 0.44–1.00)
GFR, Estimated: >60 mL/min (ref >60)
Glucose, Bld: 100 mg/dL — ABNORMAL HIGH (ref 70–99)
Potassium: 3.1 mmol/L — ABNORMAL LOW (ref 3.5–5.1)
Sodium: 141 mmol/L (ref 135–145)
Total Bilirubin: 1.1 mg/dL (ref 0.0–1.2)
Total Protein: 6.9 g/dL (ref 6.5–8.1)

## 2024-04-10 LAB — CBC
HCT: 34.6 % — ABNORMAL LOW (ref 36.0–46.0)
Hemoglobin: 11.6 g/dL — ABNORMAL LOW (ref 12.0–15.0)
MCH: 26.1 pg (ref 26.0–34.0)
MCHC: 33.5 g/dL (ref 30.0–36.0)
MCV: 77.9 fL — ABNORMAL LOW (ref 80.0–100.0)
Platelets: 157 10*3/uL (ref 150–400)
RBC: 4.44 MIL/uL (ref 3.87–5.11)
RDW: 15.3 % (ref 11.5–15.5)
WBC: 5.7 10*3/uL (ref 4.0–10.5)
nRBC: 0 % (ref 0.0–0.2)

## 2024-04-10 LAB — LIPASE, BLOOD: Lipase: 26 U/L (ref 11–51)

## 2024-04-10 LAB — TROPONIN I (HIGH SENSITIVITY): Troponin I (High Sensitivity): 3 ng/L

## 2024-04-10 LAB — PHOSPHORUS: Phosphorus: 4.1 mg/dL (ref 2.5–4.6)

## 2024-04-10 LAB — HCG, QUANTITATIVE, PREGNANCY: hCG, Beta Chain, Quant, S: 2 m[IU]/mL (ref <5)

## 2024-04-10 LAB — MAGNESIUM: Magnesium: 1.1 mg/dL — ABNORMAL LOW (ref 1.7–2.4)

## 2024-04-10 MED ORDER — ENSURE ENLIVE PO LIQD
237.0000 mL | Freq: Two times a day (BID) | ORAL | Status: DC
  Administered 2024-04-13 – 2024-04-14 (×2): 237 mL via ORAL

## 2024-04-10 MED ORDER — POTASSIUM CHLORIDE CRYS ER 20 MEQ PO TBCR
40.0000 meq | EXTENDED_RELEASE_TABLET | Freq: Once | ORAL | Status: AC
  Administered 2024-04-10: 40 meq via ORAL
  Filled 2024-04-10: qty 2

## 2024-04-10 MED ORDER — POLYETHYLENE GLYCOL 3350 17 G PO PACK
17.0000 g | PACK | Freq: Two times a day (BID) | ORAL | Status: DC
  Administered 2024-04-10 – 2024-04-15 (×6): 17 g via ORAL
  Filled 2024-04-10 (×10): qty 1

## 2024-04-10 MED ORDER — ACETAMINOPHEN 325 MG PO TABS: 650.0000 mg | ORAL_TABLET | Freq: Four times a day (QID) | ORAL | Status: DC | PRN

## 2024-04-10 MED ORDER — MAGNESIUM SULFATE 2 GM/50ML IV SOLN
2.0000 g | Freq: Once | INTRAVENOUS | Status: AC
  Administered 2024-04-10: 2 g via INTRAVENOUS
  Filled 2024-04-10: qty 50

## 2024-04-10 MED ORDER — HYDRALAZINE HCL 20 MG/ML IJ SOLN: 5.0000 mg | INTRAMUSCULAR | Status: DC | PRN

## 2024-04-10 MED ORDER — SENNOSIDES-DOCUSATE SODIUM 8.6-50 MG PO TABS
1.0000 | ORAL_TABLET | Freq: Two times a day (BID) | ORAL | Status: DC
  Administered 2024-04-10 – 2024-04-11 (×2): 1 via ORAL
  Filled 2024-04-10 (×2): qty 1

## 2024-04-10 MED ORDER — MAGNESIUM OXIDE -MG SUPPLEMENT 400 (240 MG) MG PO TABS: 200.0000 mg | ORAL_TABLET | Freq: Once | ORAL | Status: DC

## 2024-04-10 MED ORDER — SMOG ENEMA
960.0000 mL | Freq: Once | RECTAL | Status: AC
  Administered 2024-04-10: 960 mL via RECTAL
  Filled 2024-04-10: qty 473

## 2024-04-10 MED ORDER — MORPHINE SULFATE (PF) 2 MG/ML IV SOLN: 1.0000 mg | INTRAVENOUS | Status: DC | PRN

## 2024-04-10 MED ORDER — ONDANSETRON HCL 4 MG/2ML IJ SOLN
4.0000 mg | Freq: Three times a day (TID) | INTRAMUSCULAR | Status: DC | PRN
  Administered 2024-04-10: 4 mg via INTRAVENOUS
  Filled 2024-04-10: qty 2

## 2024-04-10 MED ORDER — SUCRALFATE 1 G PO TABS
1.0000 g | ORAL_TABLET | Freq: Four times a day (QID) | ORAL | Status: DC
  Administered 2024-04-10 – 2024-04-16 (×22): 1 g via ORAL
  Filled 2024-04-10 (×22): qty 1

## 2024-04-10 MED ORDER — IOHEXOL 300 MG/ML  SOLN
100.0000 mL | Freq: Once | INTRAMUSCULAR | Status: AC | PRN
  Administered 2024-04-10: 75 mL via INTRAVENOUS

## 2024-04-10 NOTE — ED Provider Notes (Signed)
 [facility] Provider Note    Event Date/Time   First MD Initiated Contact with Patient 04/10/24 1606     (approximate)   History   Abdominal Pain and Constipation   HPI  [name] is a 43 y.o. 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  States that she has not had a proper bowel movement since she left the hospital in early April.  States that she has not been passing gas in last couple days.  Has had small bowel movements.  Patient also notes some chest pain, no shortness of breath.  States that she had a fall on Friday, states she fell of her wheelchair, did hit her head, did not pass out.  Has some right hip pain that is new.  States that chest pain also started after the fall.  On independent chart review, patient was admitted in early April for hypokalemia and hypomagnesemia, left AGAINST MEDICAL ADVICE.  At that time was noted that she also had a fall at home and had back spasms at the time.  This has been a chronic issue in the setting of cerebral palsy with spasticity.     Physical Exam   Triage Vital Signs: ED Triage Vitals  Encounter Vitals Group     BP 04/10/24 1417 (!) 143/99     Systolic BP Percentile --      Diastolic BP Percentile --      Pulse Rate 04/10/24 1417 93     Resp 04/10/24 1417 20     Temp 04/10/24 1417 98.9 F (37.2 C)     Temp Source 04/10/24 1417 Oral     SpO2 04/10/24 1417 100 %     Weight 04/10/24 1418 99 lb (44.9 kg)     Height 04/10/24 1418 5\' 2"  (1.575 m)     Head Circumference --      Peak Flow --      Pain Score 04/10/24 1414 8     Pain Loc --      Pain Education --      Exclude from Growth Chart --     Most recent vital signs: Vitals:   04/10/24 1417  BP: (!) 143/99   Pulse: 93  Resp: 20  Temp: 98.9 F (37.2 C)  SpO2: 100%     General: Awake, no distress.  CV:  Good peripheral perfusion.  Resp:  Normal effort.  No tachypnea or increased work of breathing, no thoracic cage tenderness Abd:  No distention.  Soft, mild diffuse tenderness, no guarding. Other:  No palpable skull deformities or tenderness, she does have some mild right lateral hip tenderness on palpation, able to range her bilateral lower extremities as well as bilateral upper extremities.  No midline spinal tenderness, no CVA tenderness bilaterally.   ED Results / Procedures / Treatments   Labs (all labs ordered are listed, but only abnormal results are displayed) Labs Reviewed  COMPREHENSIVE METABOLIC PANEL WITH GFR - Abnormal; Notable for the following components:      Result Value   Potassium 3.1 (*)    Glucose, Bld 100 (*)    All other components within normal limits  CBC - Abnormal; Notable for the following components:   Hemoglobin 11.6 (*)    HCT 34.6 (*)  MCV 77.9 (*)    All other components within normal limits  MAGNESIUM  - Abnormal; Notable for the following components:   Magnesium  1.1 (*)    All other components within normal limits  LIPASE, BLOOD  HCG, QUANTITATIVE, PREGNANCY  URINALYSIS, ROUTINE W REFLEX MICROSCOPIC  PHOSPHORUS  TROPONIN I (HIGH SENSITIVITY)     EKG  EKG shows sinus rhythm, rate 87, normal QS, normal QTc, T wave flattening in 1, aVL, no ischemic ST elevation, not sig changed compared to prior   RADIOLOGY CT head on my independent rotation without obvious intracranial hemorrhage   PROCEDURES:  Critical Care performed: Yes, see critical care procedure note(s)  .Critical Care  Performed by: Shane Darling, MD Authorized by: Shane Darling, MD   Critical care provider statement:    Critical care time (minutes):  40   Critical care was necessary to treat or prevent imminent or life-threatening deterioration of the following conditions:   Metabolic crisis   Critical care was time spent personally by me on the following activities:  Development of treatment plan with patient or surrogate, discussions with consultants, evaluation of patient's response to treatment, examination of patient, ordering and review of laboratory studies, ordering and review of radiographic studies, ordering and performing treatments and interventions, pulse oximetry, re-evaluation of patient's condition and review of old charts    MEDICATIONS ORDERED IN ED: Medications  sorbitol , magnesium  hydroxide, mineral oil, glycerin  (SMOG) enema (has no administration in time range)  magnesium  sulfate IVPB 2 g 50 mL (2 g Intravenous New Bag/Given 04/10/24 1821)  iohexol  (OMNIPAQUE ) 300 MG/ML solution 100 mL (75 mLs Intravenous Contrast Given 04/10/24 1701)  potassium chloride  SA (KLOR-CON  M) CR tablet 40 mEq (40 mEq Oral Given 04/10/24 1813)     IMPRESSION / MDM / ASSESSMENT AND PLAN / ED COURSE  I reviewed the triage vital signs and the nursing notes.                              Differential diagnosis includes, but is not limited to, for the fall, considered contusion, fracture, will get a CT head since she reports hitting her head.  For the chest pain, consider ACS, angina, costochondritis, musculoskeletal pain, patient states that she is compliant with her medications without hypoxia, no unilateral calf swelling or tenderness, is already on Eliquis  for DVT, no shortness of breath, doubt PE at this time.  For abdominal pain, considered constipation, SBO, partial SBO, colitis, diverticulitis, UTI.  Consider electrolyte derangements.  Will get labs, EKG, troponin, chest x-ray, CT abdomen pelvis, UA.  Patient's presentation is most consistent with acute presentation with potential threat to life or bodily function.  Independent review of labs imaging are below.  Discussed with patient about requiring magnesium  repletion, she is willing to be admitted for the  magnesium  infusions.  Consulted hospitalist who is agreeable with the plan for admission and will evaluate the patient.  She is admitted.  The patient is on the cardiac monitor to evaluate for evidence of arrhythmia and/or significant heart rate changes.   Clinical Course as of 04/10/24 1857  Tue Apr 10, 2024  1725 DG Hip Unilat With Pelvis 2-3 Views Right IMPRESSION: 1. No fracture. 2. Mild lower lumbar spine degenerative changes.   [TT]  1725 DG Chest 2 View No acute abnormality.  [TT]  1742 CT Head Wo Contrast No acute intracranial abnormality.  [TT]  1748 CT ABDOMEN PELVIS W CONTRAST IMPRESSION:  1. No acute inflammatory process identified within the abdomen or pelvis. 2. Multiple other nonacute observations, as described above.   [TT]  1814 Independent review of labs, potassium is 3.1, will replete, ECG is not elevated, troponin is negative, lipase is normal, no leukocytosis, her magnesium  is 1.1, will replete, given how low it is, we will plan to have her admitted for further management.  Discussed with patient as she states that she has been having difficulty setting up her infusions, hence she has not been able to go for them. [TT]  1814 Discussed with her about imaging results.  She is amenable to getting the smog enema. [TT]    Clinical Course User Index [TT] Drenda Gentle Richard Champion, MD     FINAL CLINICAL IMPRESSION(S) / ED DIAGNOSES   Final diagnoses:  Generalized abdominal pain  Hypomagnesemia  Hypokalemia  Fall, initial encounter  Chest pain, unspecified type  Constipation, unspecified constipation type     Rx / DC Orders   ED Discharge Orders     None        Note:  This document was prepared using Dragon voice recognition software and may include unintentional dictation errors.    Shane Darling, MD 04/10/24 [phone]

## 2024-04-10 NOTE — ED Triage Notes (Signed)
 Pt to ED POV with friend or family member who has left. Pt here for constipation and generalized abdominal pain radiating to back. Denies urinary symptoms. Pt states has not had full BM since 1 month (just small amounts). Pt unsure if has appendix. Pt has CP,  hard to understand speech. Pt is wheelchair bound.

## 2024-04-10 NOTE — H&P (Incomplete)
 History and Physical    [name] YQM:[id number] DOB: 07/24/1981 DOA: 04/10/2024  Referring MD/NP/PA:   PCP: Baltazar Leventhal, MD   Patient coming from:  The patient is coming from home.     Chief Complaint: Abdominal pain, constipation  HPI: [name] is a 43 y.o. female with medical history significant of Gitetlman syndrome, RSD (reflex sympathetic dystrophy), POTS, SA node dysfunction status post pacemaker placement, DVT on Eliquis , and cerebral palsy, chronic hypokalemia and hypomagnesemia, who present with constipation and abdominal pain.   Patient is hard to understand speech. Patient states that she has not had full BM for about 1 month, saying that she just has small amounts of bowel movement.  She has mild to moderate generalized abdominal pain.  She has nausea and vomited few times.  No diarrhea.  No fever or chills.  Patient does not have chest pain, cough, SOB. She states that she had a fall on Friday. She fell of her wheelchair, did hit her head, but did not pass out.  Has pain to right hip pain. She also has some rib cage pain on the right side.   Data reviewed independently and ED Course: pt was found to have WBC 5.7, potassium 3.1, magnesium  1.1, phosphorus 4.1, GFR> 60, troponin 3.  Temperature normal, blood pressure 143/99, heart rate 93, RR 20, oxygen saturation 100% on room air.  Chest x-ray negative.  X-ray of the right hip/pelvis is negative for fracture.  CT of head negative.  CT of abdomen/pelvis is negative for acute issues.  Patient is placed in telemetry bed for observation.   EKG: I have personally reviewed.  Sinus rhythm, QTc 445, early R wave progression.   Review of Systems:   General: no fevers, chills, no body weight gain, has poor appetite, has fatigue HEENT: no blurry vision, hearing changes or sore throat Respiratory: no dyspnea, coughing, wheezing CV: no chest pain, no palpitations GI: has nausea, vomiting, abdominal pain,  constipation GU: no dysuria, burning on urination, increased urinary frequency, hematuria  Ext: no leg edema Neuro: no unilateral weakness, numbness, or tingling, no vision change or hearing loss.  Has fall. Skin: no rash, no skin tear. MSK: No muscle spasm, no deformity, no limitation of range of movement in spin. Has right hip pain Heme: No easy bruising.  Travel history: No recent long distant travel.   Allergy:  Allergies  Allergen Reactions   Duloxetine     Other reaction(s): Other (See Comments), Palpitations Other reaction(s): Hallucinations, Hyperactive behavior (finding), Other (See Comments) Stressed out     Flecainide     Other reaction(s): Other (See Comments) Other reaction(s): Hypomagnesemia (disorder), Other (See Comments) Other Reaction: QUESTION HYPOMAGNESEMIA     Fluoxetine     Other reaction(s): Nausea And Vomiting, Other (See Comments) Other reaction(s): Other (See Comments) Other Reaction: INCREASED HR  causes irritability and irrational behavior "I go crazy"   Gabapentin     Goes crazy Other reaction(s): Other (See Comments) Other reaction(s): Hyperactive behavior (finding), Mental Status Changes (intolerance), Other (See Comments) Goes crazy Hallucinations   Baclofen     Other reaction(s): Other (See Comments), Other (See Comments) Weakness  Other reaction(s): Other (See Comments) Weakness and loose Weakness  Other reaction(s):  Weakness     Cephalosporins     Other reaction(s): Hives, Rash Received pre-op vancomycin and cefazolin 07/20/16. Developed urticarial rash on bilateral lower extremities + slight periorbital swelling. Treated with diphenhydramine . Received pre-op vancomycin and cefazolin 07/20/16. Developed urticarial  rash on bilateral lower extremities + slight periorbital swelling. Treated with diphenhydramine . Received pre-op vancomycin and cefazolin 07/20/16. Developed urticarial rash on bilateral lower extremities + slight periorbital  swelling. Treated with diphenhydramine .   Sulfasalazine     Other reaction(s): Nausea And Vomiting   Sulfa Antibiotics Nausea And Vomiting   Vancomycin     Other reaction(s): Hives Received pre-op vancomycin and cefazolin 07/20/16. Developed urticarial rash on bilateral lower extremities + slight periorbital swelling. Treated with diphenhydramine . Received pre-op vancomycin and cefazolin 07/20/16. Developed urticarial rash on bilateral lower extremities + slight periorbital swelling. Treated with diphenhydramine . Received pre-op vancomycin and cefazolin 07/20/16. Developed urticarial rash on bilateral lower extremities + slight periorbital swelling. Treated with diphenhydramine .   Cefazolin Rash    Tolerated ceftriaxone  2023 and cephalexin  2022    Past Medical History:  Diagnosis Date   Carpal tunnel syndrome    Cerebral palsy (HCC)    Chronic back pain    Gitelman syndrome    Hypokalemia    now resolved   Magnesium  deficiency    Pacemaker    PONV (postoperative nausea and vomiting)    Postural orthostatic tachycardia syndrome    s/p pacemaker placement.   POTS (postural orthostatic tachycardia syndrome)    RSD (reflex sympathetic dystrophy)    right foot,    RSD (reflex sympathetic dystrophy)    SA node dysfunction (HCC) 12/13/2006    Past Surgical History:  Procedure Laterality Date   ABDOMINAL HYSTERECTOMY Left 12/14/2011   Dr. Raquel Cables   CHOLECYSTECTOMY     COLONOSCOPY WITH PROPOFOL  N/A 12/11/2022   Procedure: COLONOSCOPY WITH PROPOFOL ;  Surgeon: Selena Daily, MD;  Location: [facility] ENDOSCOPY;  Service: Gastroenterology;  Laterality: N/A;   COLONOSCOPY WITH PROPOFOL  N/A 12/13/2022   Procedure: COLONOSCOPY WITH PROPOFOL ;  Surgeon: Selena Daily, MD;  Location: [facility] ENDOSCOPY;  Service: Gastroenterology;  Laterality: N/A;   INSERT / REPLACE / REMOVE PACEMAKER     ORTHOPEDIC SURGERY     PACEMAKER INSERTION     PORTACATH PLACEMENT      Social History:  reports that  she has never smoked. She has never used smokeless tobacco. She reports that she does not drink alcohol and does not use drugs.  Family History:  Family History  Problem Relation Age of Onset   Diabetes Mother    Hypertension Mother      Prior to Admission medications   Medication Sig Start Date End Date Taking? Authorizing Provider  acetaminophen  (TYLENOL ) 325 MG tablet Take 2 tablets (650 mg total) by mouth every 6 (six) hours as needed for mild pain (pain score 1-3) or fever. 09/28/23   Althia Atlas, MD  Cholecalciferol  (D2000 ULTRA STRENGTH) 50 MCG (2000 UT) CAPS Take 1 tablet by mouth daily.    [provider]  cyanocobalamin  (VITAMIN B12) 1000 MCG tablet Take 1,000 mcg by mouth daily.    [provider]  dapagliflozin propanediol (FARXIGA) 10 MG TABS tablet Take 1 tablet by mouth daily. 12/30/23 12/29/24  [provider]  ELIQUIS  5 MG TABS tablet Take 5 mg by mouth 2 (two) times daily. 08/12/22   [provider]  heparin  lock flush 100 UNIT/ML SOLN injection Inject 5 mL (500 Units total) as directed as needed for line care. Flush using SASH (saline, administer  IV, saline, heparin ) method as directed. Flush IV catheter with heparin  after the last saline flush.  Use syringe ONE TIME only then discard. Storage: Room Temperature. 08/30/23 08/30/24  [provider]  LINZESS   290 MCG CAPS capsule Take 290 mcg by mouth daily. 10/26/22   [provider]  magnesium  chloride 200 MG/ML SOLN Inject 20 mLs (4,000 mg total) as directed 2 (two) times a week. 4gm MgSulfate on 1000 ml LR per home infusion 09/29/23 09/28/24  Althia Atlas, MD  methocarbamol  (ROBAXIN ) 750 MG tablet Take 750 mg by mouth daily as needed for muscle spasms. 02/14/24   [provider]  methylphenidate  (RITALIN ) 20 MG tablet Take 20 mg by mouth 3 (three) times daily. Take 1 tablet (20 mg) TID at 0800, 1200 & 2100-2200    [provider]  montelukast  (SINGULAIR ) 10  MG tablet Take 10 mg by mouth daily as needed (allergies). 10/26/22   [provider]  pantoprazole  (PROTONIX ) 40 MG tablet Take 40 mg by mouth daily as needed. 01/26/24   [provider]  potassium chloride  SA (KLOR-CON  M) 20 MEQ tablet Take 20 mEq by mouth 2 (two) times daily as needed.    [provider]  pregabalin  (LYRICA ) 150 MG capsule Take 150 mg by mouth 2 (two) times daily.    [provider]  PREMARIN vaginal cream Place 0.5 g vaginally 2 (two) times a week. 11/08/22   [provider]  sucralfate  (CARAFATE ) 1 g tablet Take 1 g by mouth 4 (four) times daily.    [provider]  traMADol  (ULTRAM ) 50 MG tablet Take 50 mg by mouth every 6 (six) hours as needed for moderate pain. 09/20/13   [provider]    Physical Exam: Vitals:   04/10/24 1417 04/10/24 1418 04/10/24 1957  BP: (!) 143/99  138/83  Pulse: 93  87  Resp: 20  16  Temp: 98.9 F (37.2 C)  98.6 F (37 C)  TempSrc: Oral    SpO2: 100%  100%  Weight:  44.9 kg   Height:  5\' 2"  (1.575 m)    General: Not in acute distress HEENT:       Eyes: PERRL, EOMI, no jaundice       ENT: No discharge from the ears and nose, no pharynx injection, no tonsillar enlargement.        Neck: No JVD, no bruit, no mass felt. Heme: No neck lymph node enlargement. Cardiac: S1/S2, RRR, No murmurs, No gallops or rubs. Respiratory: No rales, wheezing, rhonchi or rubs. GI: Soft, nondistended, nontender, no rebound pain, no organomegaly, BS present. GU: No hematuria Ext: No pitting leg edema bilaterally. 1+DP/PT pulse bilaterally. Musculoskeletal: No joint deformities, No joint redness or warmth, no limitation of ROM in spin. Skin: No rashes.  Neuro: Alert, oriented X3, cranial nerves II-XII grossly intact, moves all extremities. Psych: Patient is not psychotic, no suicidal or hemocidal ideation.  Labs on Admission: I have personally reviewed following labs and imaging  studies  CBC: Recent Labs  Lab 04/10/24 1427  WBC 5.7  HGB 11.6*  HCT 34.6*  MCV 77.9*  PLT 157   Basic Metabolic Panel: Recent Labs  Lab 04/10/24 1427  NA 141  K 3.1*  CL 102  CO2 26  GLUCOSE 100*  BUN 18  CREATININE 0.85  CALCIUM  9.7  MG 1.1*  PHOS 4.1   GFR: Estimated Creatinine Clearance: 61.7 mL/min (by C-G formula based on SCr of 0.85 mg/dL). Liver Function Tests: Recent Labs  Lab 04/10/24 1427  AST 25  ALT 25  ALKPHOS 83  BILITOT 1.1  PROT 6.9  ALBUMIN 4.1   Recent Labs  Lab 04/10/24 1427  LIPASE 26  No results for input(s): "AMMONIA" in the last 168 hours. Coagulation Profile: No results for input(s): "INR", "PROTIME" in the last 168 hours. Cardiac Enzymes: No results for input(s): "CKTOTAL", "CKMB", "CKMBINDEX", "TROPONINI" in the last 168 hours. BNP (last 3 results) No results for input(s): "PROBNP" in the last 8760 hours. HbA1C: No results for input(s): "HGBA1C" in the last 72 hours. CBG: No results for input(s): "GLUCAP" in the last 168 hours. Lipid Profile: No results for input(s): "CHOL", "HDL", "LDLCALC", "TRIG", "CHOLHDL", "LDLDIRECT" in the last 72 hours. Thyroid  Function Tests: No results for input(s): "TSH", "T4TOTAL", "FREET4", "T3FREE", "THYROIDAB" in the last 72 hours. Anemia Panel: No results for input(s): "VITAMINB12", "FOLATE", "FERRITIN", "TIBC", "IRON", "RETICCTPCT" in the last 72 hours. Urine analysis:    Component Value Date/Time   COLORURINE YELLOW (A) 03/13/2024 0254   APPEARANCEUR CLEAR (A) 03/13/2024 0254   APPEARANCEUR Hazy 02/11/2014 1737   LABSPEC 1.020 03/13/2024 0254   LABSPEC 1.020 02/11/2014 1737   PHURINE 5.0 03/13/2024 0254   GLUCOSEU NEGATIVE 03/13/2024 0254   GLUCOSEU Negative 02/11/2014 1737   HGBUR SMALL (A) 03/13/2024 0254   BILIRUBINUR NEGATIVE 03/13/2024 0254   BILIRUBINUR Negative 02/11/2014 1737   KETONESUR NEGATIVE 03/13/2024 0254   PROTEINUR NEGATIVE 03/13/2024 0254   UROBILINOGEN 0.2  04/18/2014 1640   NITRITE NEGATIVE 03/13/2024 0254   LEUKOCYTESUR NEGATIVE 03/13/2024 0254   LEUKOCYTESUR Negative 02/11/2014 1737   Sepsis Labs: @LABRCNTIP (procalcitonin:4,lacticidven:4) )No results found for this or any previous visit (from the past 240 hours).   Radiological Exams on Admission:   Assessment/Plan Principal Problem:   Constipation Active Problems:   Electrolyte disturbance   Hypomagnesemia   Hypokalemia   Fall at home, initial encounter   Essential hypertension   DVT (deep venous thrombosis) (HCC)   Cerebral palsy, unspecified (HCC)   Protein-calorie malnutrition, moderate (HCC)   Assessment and Plan:   Constipation: Her abdominal pain is most likely due to constipation.  CT scan of abdomen/pelvis negative for acute findings.  -Place in telemetry bed for observation - Patient was given sorbitol  enema in ED - Start MiraLAX , senna code, Lizess  Electrolyte disturbance, hypomagnesemia and hypokalemia: Potassium 3.1, magnesium  1.1.  Phosphorus normal 4.1 - Repleted potassium and and magnesium  -check Mg and BMP in AM again  Fall at home, initial encounter - Fall precaution - As needed Percocet for pain - As needed Robaxin   Essential hypertension: Patient is not taking medications currently.  Blood pressure 143/99 -IV hydralazine as needed  DVT (deep venous thrombosis) (HCC) -Eliquis   Cerebral palsy, unspecified (HCC) -Fall precaution - Ritalin   Protein-calorie malnutrition, moderate (HCC): Body weight 44.9 kg, BMI 18.11 - Ensure - Nutrition consult       DVT ppx: on Eliquis   Code Status: Full code     Family Communication:     not done, no family member is at bed side.    Disposition Plan:  Anticipate discharge back to previous environment  Consults called:  none  Admission status and Level of care: Telemetry Medical:    for obs as inpt        Dispo: The patient is from: Home              Anticipated d/c is to: Home               Anticipated d/c date is: 1 day              Patient currently is not medically stable to d/c.    Severity of  Illness:  The appropriate patient status for this patient is OBSERVATION. Observation status is judged to be reasonable and necessary in order to provide the required intensity of service to ensure the patient's safety. The patient's presenting symptoms, physical exam findings, and initial radiographic and laboratory data in the context of their medical condition is felt to place them at decreased risk for further clinical deterioration. Furthermore, it is anticipated that the patient will be medically stable for discharge from the hospital within 2 midnights of admission.        Date of Service 04/11/2024    Fidencio Hue [facility]   If 7PM-7AM, please contact night-coverage [email or web address] 04/11/2024, 12:59 AM,hpinew

## 2024-04-10 NOTE — Consult Note (Signed)
 PHARMACY CONSULT NOTE - FOLLOW UP  Pharmacy Consult for Electrolyte Monitoring and Replacement   Recent Labs: Potassium (mmol/L)  Date Value  04/10/2024 3.1 (L)  01/19/2015 3.4 (L)   Magnesium  (mg/dL)  Date Value  16/09/9603 1.1 (L)  01/19/2015 1.0 (L)   Calcium  (mg/dL)  Date Value  54/08/8118 9.7   Calcium , Total (mg/dL)  Date Value  14/78/2956 8.3 (L)   Albumin (g/dL)  Date Value  21/30/8657 4.1  06/14/2014 4.0   Phosphorus (mg/dL)  Date Value  84/69/6295 2.7   Sodium (mmol/L)  Date Value  04/10/2024 141  01/19/2015 142   Assessment: [name] is a 43 yo female who presented to the ED due to abdominal pain and chest pain. 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 Pharmacy has been consulted to manage this patient's electrolytes.   Goal of Therapy:  Electrolytes WNL  Plan:  -- K = 3.1, provider already replaced with 40 mEq PO x 1 -- Mg = 1.1, provider already replaced with 4g Magnesium  sulfate x 1 -- Phos = 4.1, no replacement indicated at this time -- Continue to monitor electrolytes with AM labs  Thank you for allowing pharmacy to participate in this patient's care.   Craven Do, PharmD Pharmacy Resident  04/10/2024 7:26 PM

## 2024-04-11 ENCOUNTER — Observation Stay: Payer: Medicare (Managed Care)

## 2024-04-11 DIAGNOSIS — K5900 Constipation, unspecified: Secondary | ICD-10-CM | POA: Diagnosis not present

## 2024-04-11 LAB — CBC
HCT: 31.9 % — ABNORMAL LOW (ref 36.0–46.0)
Hemoglobin: 10.5 g/dL — ABNORMAL LOW (ref 12.0–15.0)
MCH: 26.3 pg (ref 26.0–34.0)
MCHC: 32.9 g/dL (ref 30.0–36.0)
MCV: 79.8 fL — ABNORMAL LOW (ref 80.0–100.0)
Platelets: 124 10*3/uL — ABNORMAL LOW (ref 150–400)
RBC: 4 MIL/uL (ref 3.87–5.11)
RDW: 15.7 % — ABNORMAL HIGH (ref 11.5–15.5)
WBC: 17.7 10*3/uL — ABNORMAL HIGH (ref 4.0–10.5)
nRBC: 0 % (ref 0.0–0.2)

## 2024-04-11 LAB — URINALYSIS, ROUTINE W REFLEX MICROSCOPIC
Bilirubin Urine: NEGATIVE
Glucose, UA: NEGATIVE mg/dL
Ketones, ur: NEGATIVE mg/dL
Nitrite: NEGATIVE
Protein, ur: NEGATIVE mg/dL
Specific Gravity, Urine: 1.034 — ABNORMAL HIGH (ref 1.005–1.030)
WBC, UA: >50 WBC/hpf (ref 0–5)
pH: 5 (ref 5.0–8.0)

## 2024-04-11 LAB — BASIC METABOLIC PANEL WITH GFR
Anion gap: 9 (ref 5–15)
BUN: 21 mg/dL — ABNORMAL HIGH (ref 6–20)
CO2: 24 mmol/L (ref 22–32)
Calcium: 8.5 mg/dL — ABNORMAL LOW (ref 8.9–10.3)
Chloride: 106 mmol/L (ref 98–111)
Creatinine, Ser: 0.94 mg/dL (ref 0.44–1.00)
GFR, Estimated: >60 mL/min (ref >60)
Glucose, Bld: 150 mg/dL — ABNORMAL HIGH (ref 70–99)
Potassium: 2.9 mmol/L — ABNORMAL LOW (ref 3.5–5.1)
Sodium: 139 mmol/L (ref 135–145)

## 2024-04-11 LAB — PHOSPHORUS: Phosphorus: 2.4 mg/dL — ABNORMAL LOW (ref 2.5–4.6)

## 2024-04-11 LAB — MAGNESIUM: Magnesium: 2.1 mg/dL (ref 1.7–2.4)

## 2024-04-11 MED ORDER — POTASSIUM CHLORIDE 10 MEQ/100ML IV SOLN
10.0000 meq | INTRAVENOUS | Status: AC
  Administered 2024-04-11 (×4): 10 meq via INTRAVENOUS
  Filled 2024-04-11 (×2): qty 100

## 2024-04-11 MED ORDER — PANTOPRAZOLE SODIUM 40 MG IV SOLR
40.0000 mg | Freq: Two times a day (BID) | INTRAVENOUS | Status: AC
  Administered 2024-04-11 – 2024-04-12 (×4): 40 mg via INTRAVENOUS
  Filled 2024-04-11 (×4): qty 10

## 2024-04-11 MED ORDER — SODIUM CHLORIDE 0.9 % IV BOLUS
500.0000 mL | Freq: Once | INTRAVENOUS | Status: AC
  Administered 2024-04-11: 500 mL via INTRAVENOUS

## 2024-04-11 MED ORDER — METHYLPHENIDATE HCL 10 MG PO TABS
20.0000 mg | ORAL_TABLET | Freq: Three times a day (TID) | ORAL | Status: DC
  Administered 2024-04-11 – 2024-04-16 (×16): 20 mg via ORAL
  Filled 2024-04-11 (×21): qty 2

## 2024-04-11 MED ORDER — PREGABALIN 75 MG PO CAPS
150.0000 mg | ORAL_CAPSULE | Freq: Two times a day (BID) | ORAL | Status: DC
  Administered 2024-04-11 – 2024-04-16 (×8): 150 mg via ORAL
  Filled 2024-04-11 (×10): qty 2

## 2024-04-11 MED ORDER — BISACODYL 5 MG PO TBEC
10.0000 mg | DELAYED_RELEASE_TABLET | Freq: Every day | ORAL | Status: DC
  Filled 2024-04-11: qty 2

## 2024-04-11 MED ORDER — VITAMIN B-12 1000 MCG PO TABS
1000.0000 ug | ORAL_TABLET | Freq: Every day | ORAL | Status: DC
  Administered 2024-04-11 – 2024-04-16 (×6): 1000 ug via ORAL
  Filled 2024-04-11 (×6): qty 1

## 2024-04-11 MED ORDER — SODIUM CHLORIDE 0.9 % IV SOLN
12.5000 mg | Freq: Four times a day (QID) | INTRAVENOUS | Status: AC | PRN
  Administered 2024-04-11: 12.5 mg via INTRAVENOUS
  Filled 2024-04-11: qty 12.5

## 2024-04-11 MED ORDER — METHOCARBAMOL 500 MG PO TABS: 750.0000 mg | ORAL_TABLET | Freq: Every day | ORAL | Status: DC | PRN

## 2024-04-11 MED ORDER — PANTOPRAZOLE SODIUM 40 MG PO TBEC: 40.0000 mg | DELAYED_RELEASE_TABLET | Freq: Every day | ORAL | Status: DC | PRN

## 2024-04-11 MED ORDER — POTASSIUM PHOSPHATES 15 MMOLE/5ML IV SOLN
30.0000 mmol | Freq: Once | INTRAVENOUS | Status: AC
  Administered 2024-04-11: 30 mmol via INTRAVENOUS
  Filled 2024-04-11: qty 10

## 2024-04-11 MED ORDER — PANTOPRAZOLE SODIUM 40 MG PO TBEC
40.0000 mg | DELAYED_RELEASE_TABLET | Freq: Every day | ORAL | Status: DC
  Administered 2024-04-13 – 2024-04-16 (×4): 40 mg via ORAL
  Filled 2024-04-11 (×4): qty 1

## 2024-04-11 MED ORDER — SODIUM CHLORIDE 0.9 % IV SOLN: INTRAVENOUS | Status: AC

## 2024-04-11 MED ORDER — POTASSIUM CHLORIDE CRYS ER 20 MEQ PO TBCR
40.0000 meq | EXTENDED_RELEASE_TABLET | ORAL | Status: DC
  Administered 2024-04-11: 40 meq via ORAL
  Filled 2024-04-11: qty 2

## 2024-04-11 MED ORDER — BISACODYL 10 MG RE SUPP
10.0000 mg | Freq: Every day | RECTAL | Status: DC | PRN
  Filled 2024-04-11: qty 1

## 2024-04-11 MED ORDER — LINACLOTIDE 145 MCG PO CAPS
290.0000 ug | ORAL_CAPSULE | Freq: Every day | ORAL | Status: DC
  Administered 2024-04-11 – 2024-04-15 (×5): 290 ug via ORAL
  Filled 2024-04-11: qty 2
  Filled 2024-04-11: qty 1
  Filled 2024-04-11 (×3): qty 2
  Filled 2024-04-11: qty 1
  Filled 2024-04-11 (×2): qty 2

## 2024-04-11 MED ORDER — OXYCODONE-ACETAMINOPHEN 5-325 MG PO TABS: 1.0000 | ORAL_TABLET | ORAL | Status: DC | PRN

## 2024-04-11 MED ORDER — ADULT MULTIVITAMIN W/MINERALS CH
1.0000 | ORAL_TABLET | Freq: Every day | ORAL | Status: DC
  Administered 2024-04-11 – 2024-04-16 (×4): 1 via ORAL
  Filled 2024-04-11 (×5): qty 1

## 2024-04-11 MED ORDER — PANTOPRAZOLE SODIUM 40 MG IV SOLR
40.0000 mg | Freq: Two times a day (BID) | INTRAVENOUS | Status: DC
Start: 2024-04-11 — End: 2024-04-11

## 2024-04-11 MED ORDER — APIXABAN 5 MG PO TABS
5.0000 mg | ORAL_TABLET | Freq: Two times a day (BID) | ORAL | Status: DC
  Administered 2024-04-11 – 2024-04-16 (×11): 5 mg via ORAL
  Filled 2024-04-11 (×11): qty 1

## 2024-04-11 MED ORDER — POTASSIUM CHLORIDE CRYS ER 20 MEQ PO TBCR: 40.0000 meq | EXTENDED_RELEASE_TABLET | Freq: Once | ORAL | Status: DC

## 2024-04-11 NOTE — Care Management Obs Status (Signed)
 MEDICARE OBSERVATION STATUS NOTIFICATION   Patient Details  Name: [name] MRN: [id number] Date of Birth: [birth year]   Medicare Observation Status Notification Given:  Rudolph Cost, CMA 04/11/2024, 2:16 PM

## 2024-04-11 NOTE — Progress Notes (Signed)
 [facility] Progress Note  Patient: [name]    UJW:[id number]  DOA: 04/10/2024     Date of Service: the patient was seen and examined on 04/11/2024  Chief Complaint  Patient presents with   Abdominal Pain   Constipation   Brief hospital course: [name] is a 43 y.o. female with medical history significant of Gitetlman syndrome, RSD (reflex sympathetic dystrophy), POTS, SA node dysfunction status post pacemaker placement, DVT on Eliquis , and cerebral palsy, chronic hypokalemia and hypomagnesemia, who present with constipation and abdominal pain.    Patient is hard to understand speech. Patient states that she has not had full BM for about 1 month, saying that she just has small amounts of bowel movement.  She has mild to moderate generalized abdominal pain.  She has nausea and vomited few times.  No diarrhea.  No fever or chills.  Patient does not have chest pain, cough, SOB. She states that she had a fall on Friday. She fell of her wheelchair, did hit her head, but did not pass out.  Has pain to right hip pain. She also has some rib cage pain on the right side.    Data reviewed independently and ED Course: pt was found to have WBC 5.7, potassium 3.1, magnesium  1.1, phosphorus 4.1, GFR> 60, troponin 3.  Temperature normal, blood pressure 143/99, heart rate 93, RR 20, oxygen saturation 100% on room air.  Chest x-ray negative.  X-ray of the right hip/pelvis is negative for fracture.  CT of head negative.  CT of abdomen/pelvis is negative for acute issues.  Patient is placed in telemetry bed for observation.     EKG: I have personally reviewed.  Sinus rhythm, QTc 445, early R wave progression.   Assessment and Plan:  # Constipation: Her abdominal pain is most likely due to constipation.  CT scan of abdomen/pelvis negative for acute findings. - Patient was given sorbitol  enema in ED - Start MiraLAX  twice daily, Linzess  daily Started Dulcolax 10 mg p.o. nightly and Dulcolax  suppository as needed  # Electrolyte imbalance # Hypokalemia, hypomagnesemia and hypophosphatemia Electrolytes repleted Monitor and replete as needed.    # Fall at home, initial encounter - Fall precaution - As needed Percocet for pain - As needed Robaxin    GERD/PUD Started PPI 40 mg IV twice daily for 2 days followed by oral pantoprazole  40 mg p.o. daily Continued Carafate  home dose   Essential hypertension: Patient is not taking medications currently.  4/30 BP was low, started IV fluid for hydration Monitor BP and titrate medication accordingly -IV hydralazine as needed   DVT (deep venous thrombosis): on Eliquis    Cerebral palsy, unspecified:  -Fall precaution - Ritalin    Protein-calorie malnutrition, moderate:  Body weight 44.9 kg, BMI 18.11 - Continue Ensure - Nutrition consult   Body mass index is 18.11 kg/m.  Nutrition Problem: Moderate Malnutrition Etiology: chronic illness (Gitelman syndrome) Interventions: Interventions: Ensure Enlive (each supplement provides 350kcal and 20 grams of protein), MVI, Liberalize Diet   Diet: Regular diet DVT Prophylaxis: Therapeutic Anticoagulation with Eliquis     Advance goals of care discussion: Full code  Family Communication: family was not present at bedside, at the time of interview.  The pt provided permission to discuss medical plan with the family. Opportunity was given to ask question and all questions were answered satisfactorily.   Disposition:  Pt is from home, admitted with severe constipation, electrolyte imbalance, still has electrolyte imbalance, which precludes a safe discharge. Discharge to home,  when stable, may need few days to improve.  Subjective: Overnight patient had severe abdominal pain, she did move bowels after enema.  She was having persistent nausea and vomiting overnight but in the morning time she felt improvement.  Complaining of epigastric pain in the right hip area pain s/p fall at  home. Nausea and vomiting resolved.  Passing gas.   Physical Exam: General: NAD, lying comfortably Appear in no distress, affect appropriate Eyes: PERRLA ENT: Oral Mucosa Clear, moist  Neck: no JVD,  Cardiovascular: S1 and S2 Present, no Murmur,  Respiratory: good respiratory effort, Bilateral Air entry equal and Decreased, no Crackles, no wheezes Abdomen: Bowel Sound present, Soft and no tenderness,  Skin: no rashes Extremities: no Pedal edema, no calf tenderness Neurologic: without any new focal findings Gait not checked due to patient safety concerns  Vitals:   04/11/24 0754 04/11/24 1100 04/11/24 1140 04/11/24 1145  BP: (!) 72/59 113/82 113/82   Pulse: 90  (!) 118 85  Resp: 16   16  Temp: 98.2 F (36.8 C)   97.8 F (36.6 C)  TempSrc:      SpO2: 100%   100%  Weight:      Height:        Intake/Output Summary (Last 24 hours) at 04/11/2024 1516 Last data filed at 04/11/2024 0630 Gross per 24 hour  Intake 1056.67 ml  Output 40 ml  Net 1016.67 ml   Filed Weights   04/10/24 1418  Weight: 44.9 kg    Data Reviewed: I have personally reviewed and interpreted daily labs, tele strips, imagings as discussed above. I reviewed all nursing notes, pharmacy notes, vitals, pertinent old records I have discussed plan of care as described above with RN and patient/family.  CBC: Recent Labs  Lab 04/10/24 1427 04/11/24 0842  WBC 5.7 17.7*  HGB 11.6* 10.5*  HCT 34.6* 31.9*  MCV 77.9* 79.8*  PLT 157 124*   Basic Metabolic Panel: Recent Labs  Lab 04/10/24 1427 04/11/24 0842  NA 141 139  K 3.1* 2.9*  CL 102 106  CO2 26 24  GLUCOSE 100* 150*  BUN 18 21*  CREATININE 0.85 0.94  CALCIUM  9.7 8.5*  MG 1.1* 2.1  PHOS 4.1 2.4*    Studies: DG Abd 1 View Result Date: 04/11/2024 CLINICAL DATA:  [id number].  Abdominal pain. EXAM: ABDOMEN - 1 VIEW COMPARISON:  Abdomen series 03/12/2024, CT abdomen and pelvis contrast yesterday. FINDINGS: The bowel gas pattern is normal. No  radio-opaque calculi or acute radiographic abnormality are seen. Epicardial pacemaker/defibrillator leads overlie the right heart extending up from the abdomen with the power source in the right mid abdomen. There are cholecystectomy clips. Mild dextroscoliosis and degenerative change lumbar spine. Comparison to the prior studies reveals no significant interval change. IMPRESSION: 1. No acute radiographic findings. 2. Epicardial pacemaker/defibrillator. 3. Mild dextroscoliosis and degenerative change lumbar spine. Electronically Signed   By: Denman Fischer [name]   On: 04/11/2024 02:24   CT ABDOMEN PELVIS W CONTRAST Result Date: 04/10/2024 CLINICAL DATA:  Abdominal pain, acute, nonlocalized. EXAM: CT ABDOMEN AND PELVIS WITH CONTRAST TECHNIQUE: Multidetector CT imaging of the abdomen and pelvis was performed using the standard protocol following bolus administration of intravenous contrast. RADIATION DOSE REDUCTION: This exam was performed according to the departmental dose-optimization program which includes automated exposure control, adjustment of the mA and/or kV according to patient size and/or use of iterative reconstruction technique. CONTRAST:  75mL OMNIPAQUE  IOHEXOL  300 MG/ML  SOLN COMPARISON:  CT scan chest, abdomen  and pelvis from 11/10/2023. FINDINGS: Lower chest: There are dependent changes in the visualized lung bases. No overt consolidation. No pleural effusion. The heart is normal in size. No pericardial effusion. Partially seen pacemaker leads. Hepatobiliary: The liver is normal in size. Non-cirrhotic configuration. No suspicious mass. These is mild diffuse hepatic steatosis. No intrahepatic or extrahepatic bile duct dilation. Gallbladder is surgically absent. Pancreas: Probable atrophic pancreas. Spleen: Within normal limits. No focal lesion. Adrenals/Urinary Tract: Adrenal glands are unremarkable. No suspicious renal mass. There are multiple hypoattenuating structures in bilateral kidneys, which  are not well evaluated on the current exam. No nephroureterolithiasis. Mild fullness in the bilateral renal collecting systems. No hydroureter. Unremarkable urinary bladder. Stomach/Bowel: No disproportionate dilation of the small or large bowel loops. No evidence of abnormal bowel wall thickening or inflammatory changes. The appendix was not visualized; however there is no acute inflammatory process in the right lower quadrant. There is moderate stool burden. Vascular/Lymphatic: No ascites or pneumoperitoneum. No abdominal or pelvic lymphadenopathy, by size criteria. No aneurysmal dilation of the major abdominal arteries. There are scattered colonic diverticula without diverticulitis. Reproductive: The uterus is surgically absent. No large adnexal mass. Other: The visualized soft tissues and abdominal wall are unremarkable. Musculoskeletal: No suspicious osseous lesions. There are mild multilevel degenerative changes in the visualized spine. IMPRESSION: 1. No acute inflammatory process identified within the abdomen or pelvis. 2. Multiple other nonacute observations, as described above. Electronically Signed   By: Beula Brunswick [name]   On: 04/10/2024 17:43   CT Head Wo Contrast Result Date: 04/10/2024 CLINICAL DATA:  fall, hit head EXAM: CT HEAD WITHOUT CONTRAST TECHNIQUE: Contiguous axial images were obtained from the base of the skull through the vertex without intravenous contrast. RADIATION DOSE REDUCTION: This exam was performed according to the departmental dose-optimization program which includes automated exposure control, adjustment of the mA and/or kV according to patient size and/or use of iterative reconstruction technique. COMPARISON:  CT head 11/10/2023 FINDINGS: Brain: Chronic left basal ganglia lacunar infarction. No evidence of large-territorial acute infarction. No parenchymal hemorrhage. No mass lesion. No extra-axial collection. No mass effect or midline shift. No hydrocephalus. Basilar  cisterns are patent. Vascular: No hyperdense vessel. Skull: No acute fracture or focal lesion. Sinuses/Orbits: Paranasal sinuses and mastoid air cells are clear. The orbits are unremarkable. Other: None. IMPRESSION: No acute intracranial abnormality. Electronically Signed   By: Morgane  Naveau [name]   On: 04/10/2024 17:37   DG Hip Unilat With Pelvis 2-3 Views Right Result Date: 04/10/2024 CLINICAL DATA:  Right hip pain following a fall. EXAM: DG HIP (WITH OR WITHOUT PELVIS) 2-3V RIGHT COMPARISON:  None Available. FINDINGS: Normal-appearing hips with no fracture or dislocation seen. No pelvic fractures demonstrated. Mild lower lumbar spine degenerative changes. IMPRESSION: 1. No fracture. 2. Mild lower lumbar spine degenerative changes. Electronically Signed   By: Catherin Closs [name]   On: 04/10/2024 17:10   DG Chest 2 View Result Date: 04/10/2024 CLINICAL DATA:  Chest and abdominal pain. EXAM: CHEST - 2 VIEW COMPARISON:  03/12/2024 FINDINGS: Normal sized heart. Clear lungs with normal vascularity. Stable right jugular porta catheter with its tip in the region of the superior cavoatrial junction. Stable left subclavian right atrial pacemaker lead and abdominal epicardial pacemaker leads. Stable broken inferior sternotomy wires. Mild thoracolumbar degenerative changes acute kyphosis. IMPRESSION: No acute abnormality. Electronically Signed   By: Catherin Closs [name]   On: 04/10/2024 16:44    Scheduled Meds:  apixaban   5 mg Oral BID   bisacodyl   10 mg Oral QHS   cyanocobalamin   1,000 mcg Oral Daily   feeding supplement  237 mL Oral BID BM   linaclotide   290 mcg Oral Daily   methylphenidate   20 mg Oral TID   multivitamin with minerals  1 tablet Oral Daily   pantoprazole  (PROTONIX ) IV  40 mg Intravenous Q12H   Followed by   Cecily Cohen ON 04/13/2024] pantoprazole   40 mg Oral Daily   polyethylene glycol  17 g Oral BID   pregabalin   150 mg Oral BID   sucralfate   1 g Oral QID   Continuous Infusions:  sodium  chloride 75 mL/hr at 04/11/24 0900   potassium chloride  10 mEq (04/11/24 1439)   potassium PHOSPHATE IVPB (in mmol)     PRN Meds: acetaminophen , bisacodyl , hydrALAZINE, methocarbamol , morphine injection, ondansetron  (ZOFRAN ) IV, oxyCODONE -acetaminophen   Time spent: 55 minutes  Author: Althia Atlas. MD [facility] 04/11/2024 3:16 PM  To reach On-call, see care teams to locate the attending and reach out to them via [email or web address]. If 7PM-7AM, please contact night-coverage If you still have difficulty reaching the attending provider, please page the [facility] (Director on Call) for [facility] on amion for assistance.

## 2024-04-11 NOTE — Significant Event (Signed)
       CROSS COVER NOTE  NAME: [name] MRN: [id number] DOB : [birth year]    Concern as stated by nurse / staff   Vomiting, constipation and  had a large bowel movement followed by abdominal pain and now having intractable vomiting unrelieved with Zofran      Pertinent findings on chart review: Patient admitted earlier with constipation and electrolyte abnormality.  Negative CT abdomen and pelvis    04/10/2024    7:57 PM 04/10/2024    2:18 PM 04/10/2024    2:17 PM  Vitals with BMI  Height  5\' 2"    Weight  99 lbs   BMI  18.1   Systolic 138  143  Diastolic 83  99  Pulse 87  93     Assessment and  Interventions   Assessment:  Vomiting  Abdominal pain following a large bowel movement  Plan: CT abdomen and pelvis done earlier with no abnormal findings Patient now having abdominal pain and vomiting We will get an abdominal x-ray---> IMPRESSION: 1. No acute radiographic findings. 2. Epicardial pacemaker/defibrillator. 3. Mild dextroscoliosis and degenerative change lumbar spine.

## 2024-04-11 NOTE — Consult Note (Addendum)
 PHARMACY CONSULT NOTE - FOLLOW UP  Pharmacy Consult for Electrolyte Monitoring and Replacement   Recent Labs: Potassium (mmol/L)  Date Value  04/11/2024 2.9 (L)  01/19/2015 3.4 (L)   Magnesium  (mg/dL)  Date Value  62/13/0865 2.1  01/19/2015 1.0 (L)   Calcium  (mg/dL)  Date Value  78/46/9629 8.5 (L)   Calcium , Total (mg/dL)  Date Value  52/84/1324 8.3 (L)   Albumin (g/dL)  Date Value  40/09/2724 4.1  06/14/2014 4.0   Phosphorus (mg/dL)  Date Value  36/64/4034 4.1   Sodium (mmol/L)  Date Value  04/11/2024 139  01/19/2015 142   Assessment: EW is a 43 yo female who presented to the ED due to abdominal pain and chest pain. 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 Pharmacy has been consulted to manage this patient's electrolytes.   Goal of Therapy:  Electrolytes WNL  Plan:  -- K 2.9   Will order KCL 40 meq po x 2 doses-   **see note below --Phos 2.4     MD ordered Potassium phosphate 30 mmol IV x 1  (will also provide 44 meq K+) -- Continue to monitor electrolytes with AM labs  **Note: pt received KCL 40 meq PO x 1 and then MD changed to KCL 10 meq IV x 4   Thank you for allowing pharmacy to participate in this patient's care.   Heavan Francom A, PharmD 04/11/2024 9:58 AM

## 2024-04-11 NOTE — Progress Notes (Signed)
 Initial Nutrition Assessment  DOCUMENTATION CODES:   Underweight, Non-severe (moderate) malnutrition in context of chronic illness  INTERVENTION:   -Liberalize diet to regular for widest variety of meal selections -MVI with minerals daily -Continue Ensure Enlive po BID, each supplement provides 350 kcal and 20 grams of protein.  -Feeding assistance with meals  NUTRITION DIAGNOSIS:   Moderate Malnutrition related to chronic illness (Gitelman syndrome) as evidenced by mild fat depletion, mild muscle depletion, moderate muscle depletion.  GOAL:   Patient will meet greater than or equal to 90% of their needs  MONITOR:   PO intake, Supplement acceptance  REASON FOR ASSESSMENT:   Consult Assessment of nutrition requirement/status, Poor PO  ASSESSMENT:   Pt with medical history significant of Gitetlman syndrome, RSD (reflex sympathetic dystrophy), POTS, SA node dysfunction status post pacemaker placement, DVT on Eliquis , and cerebral palsy, chronic hypokalemia and hypomagnesemia, who present with constipation and abdominal pain.  Pt admitted with constipation.   Reviewed I/O's: +1 L x 24 hours  Emesis: 40 ml x 24 hours  Spoke with pt at bedside, who complains of weakness. Pt difficult to understand at times. She thinks that she is more weak since admission. Pt complains of vomiting last night and suspect this may be contributing to weakness. Pt shares that she has had constipation and abdominal pain over the past 2 weeks. She has been followed by GI for these issues, but reports that she has been unable to attend appointments due to feeling unwell. She shares that she has been prescribed a medications (isbrela), however, has had difficult obtaining it due to requiring pre-authorization. There is no record of this prescription per review of [facility] meds and [facility]. Pt shares she responds mostly to enemas when constipated.   Observed lunch tray- pt consumed 100% of grilled cheese  sandwich. She denies any difficulty chewing or swallowing foods.   Reviewed wt hx; noted wt gain over the past 6 months.   Discussed importance of good meal and supplement intake to promote healing.   Medications reviewed and include dulcolax, vitamin B-12, ritalin , protonix , miralax , carafate , 0.9% sodium chloride  infusion @ 75 ml/hr.   Labs reviewed: K: 2.9 (on IV supplementation, CBGS: 115 (inpatient orders for glycemic control are none).    NUTRITION - FOCUSED PHYSICAL EXAM:  Flowsheet Row Most Recent Value  Orbital Region No depletion  Upper Arm Region Mild depletion  Thoracic and Lumbar Region Mild depletion  Buccal Region No depletion  Temple Region Mild depletion  Clavicle Bone Region Mild depletion  Clavicle and Acromion Bone Region No depletion  Scapular Bone Region No depletion  Dorsal Hand Moderate depletion  Patellar Region Mild depletion  Anterior Thigh Region Mild depletion  Posterior Calf Region Mild depletion  Edema (RD Assessment) None  Hair Reviewed  Eyes Reviewed  Mouth Reviewed  Skin Reviewed  Nails Reviewed       Diet Order:   Diet Order             Diet regular Room service appropriate? Yes; Fluid consistency: Thin  Diet effective now                   EDUCATION NEEDS:   Education needs have been addressed  Skin:  Skin Assessment: Reviewed RN Assessment  Last BM:  04/11/24 (type 6)  Height:   Ht Readings from Last 1 Encounters:  04/10/24 5\' 2"  (1.575 m)    Weight:   Wt Readings from Last 1 Encounters:  04/10/24 44.9 kg  Ideal Body Weight:  50 kg  BMI:  Body mass index is 18.11 kg/m.  Estimated Nutritional Needs:   Kcal:  1350-1550  Protein:  65-80 grams  Fluid:  1.3-1.5 L    Herschel Lords, RD, LDN, CDCES Registered Dietitian III Certified Diabetes Care and Education Specialist If unable to reach this RD, please use "RD Inpatient" group chat on secure chat between hours of 8am-4 pm daily

## 2024-04-11 NOTE — Plan of Care (Signed)
   Problem: Clinical Measurements: Goal: Diagnostic test results will improve Outcome: Progressing   Problem: Activity: Goal: Risk for activity intolerance will decrease Outcome: Progressing   Problem: Pain Managment: Goal: General experience of comfort will improve and/or be controlled Outcome: Progressing   Problem: Safety: Goal: Ability to remain free from injury will improve Outcome: Progressing

## 2024-04-12 DIAGNOSIS — R8281 Pyuria: Secondary | ICD-10-CM | POA: Diagnosis not present

## 2024-04-12 DIAGNOSIS — E440 Moderate protein-calorie malnutrition: Secondary | ICD-10-CM | POA: Diagnosis present

## 2024-04-12 DIAGNOSIS — M25551 Pain in right hip: Secondary | ICD-10-CM | POA: Diagnosis present

## 2024-04-12 DIAGNOSIS — I10 Essential (primary) hypertension: Secondary | ICD-10-CM | POA: Diagnosis present

## 2024-04-12 DIAGNOSIS — G90A Postural orthostatic tachycardia syndrome (POTS): Secondary | ICD-10-CM | POA: Diagnosis present

## 2024-04-12 DIAGNOSIS — R0781 Pleurodynia: Secondary | ICD-10-CM | POA: Diagnosis present

## 2024-04-12 DIAGNOSIS — Z8249 Family history of ischemic heart disease and other diseases of the circulatory system: Secondary | ICD-10-CM | POA: Diagnosis not present

## 2024-04-12 DIAGNOSIS — Z86718 Personal history of other venous thrombosis and embolism: Secondary | ICD-10-CM | POA: Diagnosis not present

## 2024-04-12 DIAGNOSIS — K279 Peptic ulcer, site unspecified, unspecified as acute or chronic, without hemorrhage or perforation: Secondary | ICD-10-CM | POA: Diagnosis present

## 2024-04-12 DIAGNOSIS — K219 Gastro-esophageal reflux disease without esophagitis: Secondary | ICD-10-CM | POA: Diagnosis present

## 2024-04-12 DIAGNOSIS — Z993 Dependence on wheelchair: Secondary | ICD-10-CM | POA: Diagnosis not present

## 2024-04-12 DIAGNOSIS — Z681 Body mass index (BMI) 19 or less, adult: Secondary | ICD-10-CM | POA: Diagnosis not present

## 2024-04-12 DIAGNOSIS — Z833 Family history of diabetes mellitus: Secondary | ICD-10-CM | POA: Diagnosis not present

## 2024-04-12 DIAGNOSIS — Z90710 Acquired absence of both cervix and uterus: Secondary | ICD-10-CM | POA: Diagnosis not present

## 2024-04-12 DIAGNOSIS — W19XXXA Unspecified fall, initial encounter: Secondary | ICD-10-CM | POA: Diagnosis present

## 2024-04-12 DIAGNOSIS — Z882 Allergy status to sulfonamides status: Secondary | ICD-10-CM | POA: Diagnosis not present

## 2024-04-12 DIAGNOSIS — Z888 Allergy status to other drugs, medicaments and biological substances status: Secondary | ICD-10-CM | POA: Diagnosis not present

## 2024-04-12 DIAGNOSIS — Z950 Presence of cardiac pacemaker: Secondary | ICD-10-CM | POA: Diagnosis not present

## 2024-04-12 DIAGNOSIS — K5900 Constipation, unspecified: Secondary | ICD-10-CM | POA: Diagnosis present

## 2024-04-12 DIAGNOSIS — Y92009 Unspecified place in unspecified non-institutional (private) residence as the place of occurrence of the external cause: Secondary | ICD-10-CM | POA: Diagnosis not present

## 2024-04-12 DIAGNOSIS — Z7689 Persons encountering health services in other specified circumstances: Secondary | ICD-10-CM | POA: Diagnosis not present

## 2024-04-12 DIAGNOSIS — Z7901 Long term (current) use of anticoagulants: Secondary | ICD-10-CM | POA: Diagnosis not present

## 2024-04-12 DIAGNOSIS — I959 Hypotension, unspecified: Secondary | ICD-10-CM | POA: Diagnosis not present

## 2024-04-12 DIAGNOSIS — E876 Hypokalemia: Secondary | ICD-10-CM | POA: Diagnosis present

## 2024-04-12 DIAGNOSIS — G800 Spastic quadriplegic cerebral palsy: Secondary | ICD-10-CM | POA: Diagnosis present

## 2024-04-12 LAB — CBC
HCT: 29.5 % — ABNORMAL LOW (ref 36.0–46.0)
Hemoglobin: 9.7 g/dL — ABNORMAL LOW (ref 12.0–15.0)
MCH: 25.8 pg — ABNORMAL LOW (ref 26.0–34.0)
MCHC: 32.9 g/dL (ref 30.0–36.0)
MCV: 78.5 fL — ABNORMAL LOW (ref 80.0–100.0)
Platelets: 102 10*3/uL — ABNORMAL LOW (ref 150–400)
RBC: 3.76 MIL/uL — ABNORMAL LOW (ref 3.87–5.11)
RDW: 15.9 % — ABNORMAL HIGH (ref 11.5–15.5)
WBC: 5.3 10*3/uL (ref 4.0–10.5)
nRBC: 0 % (ref 0.0–0.2)

## 2024-04-12 LAB — BASIC METABOLIC PANEL WITH GFR
Anion gap: 3 — ABNORMAL LOW (ref 5–15)
BUN: 17 mg/dL (ref 6–20)
CO2: 25 mmol/L (ref 22–32)
Calcium: 8.6 mg/dL — ABNORMAL LOW (ref 8.9–10.3)
Chloride: 111 mmol/L (ref 98–111)
Creatinine, Ser: 0.85 mg/dL (ref 0.44–1.00)
GFR, Estimated: >60 mL/min (ref >60)
Glucose, Bld: 102 mg/dL — ABNORMAL HIGH (ref 70–99)
Potassium: 4.3 mmol/L (ref 3.5–5.1)
Sodium: 139 mmol/L (ref 135–145)

## 2024-04-12 LAB — PHOSPHORUS: Phosphorus: 4.4 mg/dL (ref 2.5–4.6)

## 2024-04-12 LAB — MAGNESIUM: Magnesium: 1.6 mg/dL — ABNORMAL LOW (ref 1.7–2.4)

## 2024-04-12 MED ORDER — MAGNESIUM SULFATE 2 GM/50ML IV SOLN
2.0000 g | Freq: Once | INTRAVENOUS | Status: AC
  Administered 2024-04-12: 2 g via INTRAVENOUS
  Filled 2024-04-12: qty 50

## 2024-04-12 MED ORDER — MAGNESIUM SULFATE IN D5W 1-5 GM/100ML-% IV SOLN
1.0000 g | Freq: Once | INTRAVENOUS | Status: AC
  Administered 2024-04-12: 1 g via INTRAVENOUS
  Filled 2024-04-12: qty 100

## 2024-04-12 NOTE — Consult Note (Signed)
 PHARMACY CONSULT NOTE - FOLLOW UP  Pharmacy Consult for Electrolyte Monitoring and Replacement   Recent Labs: Potassium (mmol/L)  Date Value  04/12/2024 4.3  01/19/2015 3.4 (L)   Magnesium  (mg/dL)  Date Value  16/09/9603 1.6 (L)  01/19/2015 1.0 (L)   Calcium  (mg/dL)  Date Value  54/08/8118 8.6 (L)   Calcium , Total (mg/dL)  Date Value  14/78/2956 8.3 (L)   Albumin (g/dL)  Date Value  21/30/8657 4.1  06/14/2014 4.0   Phosphorus (mg/dL)  Date Value  84/69/6295 4.4   Sodium (mmol/L)  Date Value  04/12/2024 139  01/19/2015 142   Assessment: EW is a 43 yo female who presented to the ED due to abdominal pain and chest pain. They have a past medical history significant for Gitelman syndrome with chronic hypokalemia and hypomagnesemia. Pharmacy has been consulted to manage this patient's electrolytes.   Goal of Therapy:  Electrolytes WNL  Plan:  Mg 1.6 today , down from 2.1 yesterday consistent with Gitelman syndrome. Will replace today with MgSulfate 3gm IV x 1. All other electrolytes within normal limits this morning. Follow up AM labs  Aleister Lady Rodriguez-Guzman PharmD, BCPS 04/12/2024 7:38 AM

## 2024-04-12 NOTE — TOC Initial Note (Signed)
 Transition of Care [facility]) - Initial/Assessment Note    Patient Details  Name: [name] MRN: [id number] Date of Birth: 1981/09/12  Transition of Care [facility]) CM/SW Contact:    Alexandra Ice, RN Phone Number: 04/12/2024, 3:56 PM  Clinical Narrative:                 Transition of Care [facility]) - Inpatient Brief Assessment   Patient Details  Name: [name] MRN: [id number] Date of Birth: 01/01/1981  Transition of Care [facility]) CM/SW Contact:    Alexandra Ice, RN Phone Number: 04/12/2024, 3:56 PM   Clinical Narrative: Received message from MD that patient needs assistance with setting up transportation to return home. She has her motorized wheelchair in the room with her. TOC met with patient at bedside, she stated she uses [facility]. Contacted [facility], they stated they spoke with mother and told her transportation needed to be set up the day before and unable to pick up patient today. Stated to arrange transportation online for tomorrow. Provided MD with update. Spoke with patient at bedside and update on discharge plan.   Transition of Care Asessment: Insurance and Status: Insurance coverage has been reviewed Patient has primary care physician: Yes Home environment has been reviewed: lives with her mom Prior level of function:: dependent with adls, Prior/Current Home Services: Current home services Social Drivers of Health Review: SDOH reviewed no interventions necessary Readmission risk has been reviewed: Yes Transition of care needs: transition of care needs identified, TOC will continue to follow         Patient Goals and CMS Choice            Expected Discharge Plan and Services                                              Prior Living Arrangements/Services                       Activities of Daily Living   ADL Screening (condition at time of admission) Independently performs ADLs?: No Does the  patient have a NEW difficulty with bathing/dressing/toileting/self-feeding that is expected to last >3 days?: No Does the patient have a NEW difficulty with getting in/out of bed, walking, or climbing stairs that is expected to last >3 days?: No Does the patient have a NEW difficulty with communication that is expected to last >3 days?: No Is the patient deaf or have difficulty hearing?: No Does the patient have difficulty seeing, even when wearing glasses/contacts?: No Does the patient have difficulty concentrating, remembering, or making decisions?: No  Permission Sought/Granted                  Emotional Assessment              Admission diagnosis:  Hypokalemia [E87.6] Hypomagnesemia [E83.42] Generalized abdominal pain [R10.84] Constipation [K59.00] Fall, initial encounter [W19.XXXA] Constipation, unspecified constipation type [K59.00] Chest pain, unspecified type [R07.9] Patient Active Problem List   Diagnosis Date Noted   Fall at home, initial encounter 04/10/2024   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   Electrolyte abnormality 03/13/2024   SA node dysfunction (HCC) 03/13/2024   Hypotension 11/10/2023   Ground-level fall 11/10/2023   Peripheral neuropathy 10/17/2023   Chest wall pain 10/17/2023   Electrolyte disturbance 09/19/2023   Palpitation 09/19/2023  Thrombocytopenia (HCC) 09/19/2023   Protein-calorie malnutrition, moderate (HCC) 09/19/2023   DVT (deep venous thrombosis) (HCC) 09/19/2023   Grade I hemorrhoids 12/13/2022   Constipation 12/11/2022   GI bleeding 12/10/2022   Essential hypertension 12/10/2022   IBS (irritable bowel syndrome) 12/10/2022   GERD without esophagitis 12/10/2022   Hypokalemia 12/10/2022   Rectal bleeding 12/10/2022   Influenza 10/25/2022   Bladder spasms 10/15/2022   Cholecystitis, chronic 01/17/2018   Conductive hearing loss of right ear with unrestricted hearing of left ear 03/21/2017   Right ovarian cyst 05/31/2015   Abdominal  pain, chronic, right lower quadrant 05/23/2015   Hypomagnesemia 03/02/2014   Back pain 03/02/2014   Sinus bradycardia 05/31/2013   Cerebral palsy, unspecified (HCC) 06/27/2012   PCP:  Baltazar Leventhal, MD Pharmacy:   [facility] [phone] [facility], [place] - [address] AT [facility] [address] [place] [address] Phone: [phone] Fax: [phone]  [facility] [address] [place] [address] Phone: [phone] Fax: [phone]     Social Drivers of Health (SDOH) Social History: SDOH Screenings   Food Insecurity: No Food Insecurity (04/10/2024)  Housing: Low Risk  (04/10/2024)  Transportation Needs: No Transportation Needs (04/10/2024)  Utilities: Not At Risk (04/10/2024)  Financial Resource Strain: Low Risk  (07/12/2023)   Received from [facility]  Social Connections: Patient Declined (04/10/2024)  Stress: Stress Concern Present (07/20/2022)   [facility] from [facility]  Tobacco Use: Low Risk  (04/10/2024)   SDOH Interventions:     Readmission Risk Interventions     No data to display

## 2024-04-12 NOTE — Progress Notes (Signed)
 [facility] Progress Note  Patient: [name]    WGN:[id number]  DOA: 04/10/2024     Date of Service: the patient was seen and examined on 04/12/2024  Chief Complaint  Patient presents with   Abdominal Pain   Constipation   Brief hospital course: VALINA HOEPPNER is a 43 y.o. female with medical history significant of Gitetlman syndrome, RSD (reflex sympathetic dystrophy), POTS, SA node dysfunction status post pacemaker placement, DVT on Eliquis , and cerebral palsy, chronic hypokalemia and hypomagnesemia, who present with constipation and abdominal pain.    Patient is hard to understand speech. Patient states that she has not had full BM for about 1 month, saying that she just has small amounts of bowel movement.  She has mild to moderate generalized abdominal pain.  She has nausea and vomited few times.  No diarrhea.  No fever or chills.  Patient does not have chest pain, cough, SOB. She states that she had a fall on Friday. She fell of her wheelchair, did hit her head, but did not pass out.  Has pain to right hip pain. She also has some rib cage pain on the right side.    Data reviewed independently and ED Course: pt was found to have WBC 5.7, potassium 3.1, magnesium  1.1, phosphorus 4.1, GFR> 60, troponin 3.  Temperature normal, blood pressure 143/99, heart rate 93, RR 20, oxygen saturation 100% on room air.  Chest x-ray negative.  X-ray of the right hip/pelvis is negative for fracture.  CT of head negative.  CT of abdomen/pelvis is negative for acute issues.  Patient is placed in telemetry bed for observation.     EKG: I have personally reviewed.  Sinus rhythm, QTc 445, early R wave progression.   Assessment and Plan:  # Constipation: Her abdominal pain is most likely due to constipation.  CT scan of abdomen/pelvis negative for acute findings. - Patient was given sorbitol  enema in ED - Start MiraLAX  twice daily, Linzess  daily Started Dulcolax 10 mg p.o. nightly and Dulcolax  suppository as needed Several BMs yesterday, constipation appears resolved. Abd pain is resolved  # Electrolyte imbalance # Hypokalemia, hypomagnesemia and hypophosphatemia Electrolytes repleted Monitor and replete as needed.   # Fall at home, initial encounter - Fall precaution - As needed Percocet for pain - As needed Robaxin    GERD/PUD Started PPI 40 mg IV twice daily for 2 days followed by oral pantoprazole  40 mg p.o. daily Continued Carafate  home dose  Essential hypertension:  Bps soft here, not on meds   DVT (deep venous thrombosis): on Eliquis    Cerebral palsy, unspecified:  -Fall precaution - Ritalin    Protein-calorie malnutrition, moderate:  Body weight 44.9 kg, BMI 18.11 - Continue Ensure - Nutrition consult   Body mass index is 18.11 kg/m.  Nutrition Problem: Moderate Malnutrition Etiology: chronic illness (Gitelman syndrome) Interventions: Interventions: Ensure Enlive (each supplement provides 350kcal and 20 grams of protein), MVI, Liberalize Diet   Diet: Regular diet DVT Prophylaxis: Therapeutic Anticoagulation with Eliquis     Advance goals of care discussion: Full code  Family Communication: none at bedside  Disposition:  Stable for discharge today. Patient is here with her motorized wheelchair. Per [facility] will not transport that wheelchair and patient has no one who can pick it up. There is a transportation company that can transport the patient and her wheelchair but they are unable to come today. Arrangements made for discharge tomorrow.   Subjective: several BMs yesterday, denies abd pain, is tolerating diet  Physical Exam: General: NAD, lying comfortably, chronically ill Eyes: PERRLA ENT: Oral Mucosa Clear, moist  Cardiovascular: S1 and S2 normal Respiratory: normal wob, no wheeze Abdomen: soft, nt Skin: no visible rashes Extremities: contracted, no edema   Vitals:   04/11/24 1935 04/12/24 0409 04/12/24 0747 04/12/24 1602  BP:  97/62 93/61 (!) 87/53 96/65  Pulse: 80 78 89 88  Resp: 16 16 16 16   Temp: 97.9 F (36.6 C) 97.9 F (36.6 C) 97.9 F (36.6 C) 98.9 F (37.2 C)  TempSrc: Oral Oral    SpO2: 99% 100% 100% 100%  Weight:      Height:        Intake/Output Summary (Last 24 hours) at 04/12/2024 1610 Last data filed at 04/12/2024 1610 Gross per 24 hour  Intake 1240 ml  Output --  Net 1240 ml   Filed Weights   04/10/24 1418  Weight: 44.9 kg    Data Reviewed: I have personally reviewed and interpreted daily labs, tele strips, imagings as discussed above. I reviewed all nursing notes, pharmacy notes, vitals, pertinent old records I have discussed plan of care as described above with RN and patient/family.  CBC: Recent Labs  Lab 04/10/24 1427 04/11/24 0842 04/12/24 0551  WBC 5.7 17.7* 5.3  HGB 11.6* 10.5* 9.7*  HCT 34.6* 31.9* 29.5*  MCV 77.9* 79.8* 78.5*  PLT 157 124* 102*   Basic Metabolic Panel: Recent Labs  Lab 04/10/24 1427 04/11/24 0842 04/12/24 0551  NA 141 139 139  K 3.1* 2.9* 4.3  CL 102 106 111  CO2 26 24 25   GLUCOSE 100* 150* 102*  BUN 18 21* 17  CREATININE 0.85 0.94 0.85  CALCIUM  9.7 8.5* 8.6*  MG 1.1* 2.1 1.6*  PHOS 4.1 2.4* 4.4    Studies: No results found.   Scheduled Meds:  apixaban   5 mg Oral BID   bisacodyl   10 mg Oral QHS   cyanocobalamin   1,000 mcg Oral Daily   feeding supplement  237 mL Oral BID BM   linaclotide   290 mcg Oral Daily   methylphenidate   20 mg Oral TID   multivitamin with minerals  1 tablet Oral Daily   pantoprazole  (PROTONIX ) IV  40 mg Intravenous Q12H   Followed by   Cecily Cohen ON 04/13/2024] pantoprazole   40 mg Oral Daily   polyethylene glycol  17 g Oral BID   pregabalin   150 mg Oral BID   sucralfate   1 g Oral QID   Continuous Infusions:   PRN Meds: acetaminophen , bisacodyl , hydrALAZINE , methocarbamol , morphine  injection, ondansetron  (ZOFRAN ) IV, oxyCODONE -acetaminophen     Author: Hines Ludwig. MD [facility] 04/12/2024 4:10  PM  To reach On-call, see care teams to locate the attending and reach out to them via [email or web address]. If 7PM-7AM, please contact night-coverage If you still have difficulty reaching the attending provider, please page the [facility] (Director on Call) for [facility] on amion for assistance.

## 2024-04-12 NOTE — Plan of Care (Signed)
  Problem: Education: Goal: Knowledge of General Education information will improve Description: Including pain rating scale, medication(s)/side effects and non-pharmacologic comfort measures Outcome: Progressing   Problem: Clinical Measurements: Goal: Will remain free from infection Outcome: Progressing Goal: Respiratory complications will improve Outcome: Progressing Goal: Cardiovascular complication will be avoided Outcome: Progressing   Problem: Nutrition: Goal: Adequate nutrition will be maintained Outcome: Progressing   Problem: Pain Managment: Goal: General experience of comfort will improve and/or be controlled Outcome: Progressing   Problem: Safety: Goal: Ability to remain free from injury will improve Outcome: Progressing   Problem: Skin Integrity: Goal: Risk for impaired skin integrity will decrease Outcome: Progressing

## 2024-04-13 DIAGNOSIS — K5900 Constipation, unspecified: Secondary | ICD-10-CM | POA: Diagnosis not present

## 2024-04-13 LAB — BASIC METABOLIC PANEL WITH GFR
Anion gap: 11 (ref 5–15)
BUN: 16 mg/dL (ref 6–20)
CO2: 26 mmol/L (ref 22–32)
Calcium: 9.6 mg/dL (ref 8.9–10.3)
Chloride: 101 mmol/L (ref 98–111)
Creatinine, Ser: 1.16 mg/dL — ABNORMAL HIGH (ref 0.44–1.00)
GFR, Estimated: >60 mL/min (ref >60)
Glucose, Bld: 119 mg/dL — ABNORMAL HIGH (ref 70–99)
Potassium: 3.9 mmol/L (ref 3.5–5.1)
Sodium: 138 mmol/L (ref 135–145)

## 2024-04-13 LAB — CBC
HCT: 37.3 % (ref 36.0–46.0)
Hemoglobin: 12.1 g/dL (ref 12.0–15.0)
MCH: 25.5 pg — ABNORMAL LOW (ref 26.0–34.0)
MCHC: 32.4 g/dL (ref 30.0–36.0)
MCV: 78.7 fL — ABNORMAL LOW (ref 80.0–100.0)
Platelets: 123 10*3/uL — ABNORMAL LOW (ref 150–400)
RBC: 4.74 MIL/uL (ref 3.87–5.11)
RDW: 15.5 % (ref 11.5–15.5)
WBC: 4.1 10*3/uL (ref 4.0–10.5)
nRBC: 0 % (ref 0.0–0.2)

## 2024-04-13 LAB — MAGNESIUM: Magnesium: 1.7 mg/dL (ref 1.7–2.4)

## 2024-04-13 LAB — PHOSPHORUS: Phosphorus: 3.5 mg/dL (ref 2.5–4.6)

## 2024-04-13 MED ORDER — BISACODYL 5 MG PO TBEC
10.0000 mg | DELAYED_RELEASE_TABLET | Freq: Two times a day (BID) | ORAL | Status: DC
  Administered 2024-04-16: 10 mg via ORAL
  Filled 2024-04-13 (×4): qty 2

## 2024-04-13 MED ORDER — MAGNESIUM SULFATE 2 GM/50ML IV SOLN: 2.0000 g | Freq: Once | INTRAVENOUS | Status: DC

## 2024-04-13 MED ORDER — MAGNESIUM SULFATE 4 GM/100ML IV SOLN
4.0000 g | Freq: Once | INTRAVENOUS | Status: AC
  Administered 2024-04-13: 4 g via INTRAVENOUS
  Filled 2024-04-13: qty 100

## 2024-04-13 MED ORDER — SODIUM CHLORIDE 0.9 % IV SOLN
1.0000 g | INTRAVENOUS | Status: AC
  Administered 2024-04-13 – 2024-04-15 (×3): 1 g via INTRAVENOUS
  Filled 2024-04-13 (×3): qty 10

## 2024-04-13 NOTE — Progress Notes (Signed)
 [facility] Progress Note  Patient: [name]    ZOX:[id number]  DOA: 04/10/2024     Date of Service: the patient was seen and examined on 04/13/2024  Chief Complaint  Patient presents with   Abdominal Pain   Constipation   Brief hospital course: [name] is a 43 y.o. female with medical history significant of Gitetlman syndrome, RSD (reflex sympathetic dystrophy), POTS, SA node dysfunction status post pacemaker placement, DVT on Eliquis , and cerebral palsy, chronic hypokalemia and hypomagnesemia, who present with constipation and abdominal pain.    Patient is hard to understand speech. Patient states that she has not had full BM for about 1 month, saying that she just has small amounts of bowel movement.  She has mild to moderate generalized abdominal pain.  She has nausea and vomited few times.  No diarrhea.  No fever or chills.  Patient does not have chest pain, cough, SOB. She states that she had a fall on Friday. She fell of her wheelchair, did hit her head, but did not pass out.  Has pain to right hip pain. She also has some rib cage pain on the right side.    Data reviewed independently and ED Course: pt was found to have WBC 5.7, potassium 3.1, magnesium  1.1, phosphorus 4.1, GFR> 60, troponin 3.  Temperature normal, blood pressure 143/99, heart rate 93, RR 20, oxygen saturation 100% on room air.  Chest x-ray negative.  X-ray of the right hip/pelvis is negative for fracture.  CT of head negative.  CT of abdomen/pelvis is negative for acute issues.  Patient is placed in telemetry bed for observation.     EKG: I have personally reviewed.  Sinus rhythm, QTc 445, early R wave progression.   Assessment and Plan:  # Constipation: Her abdominal pain is most likely due to constipation.  CT scan of abdomen/pelvis negative for acute findings. - Patient was given sorbitol  enema in ED - Start MiraLAX  twice daily, Linzess  daily Started Dulcolax 10 mg p.o. nightly and Dulcolax  suppository as needed Several Bms on 4/30, constipation appears resolved. Abd pain is resolved 5/2 no BM in the past 2 days, patient is refusing MiraLAX , does not like taste.  Advised to continue MiraLAX  along with grape juice, RN was advised.    # Possible UTI Afebrile, temp 100.4, feels hot, does not feel good to go home on 5/2 UA positive Empirically started ceftriaxone  1 g IV daily for 3 days Follow urine culture   # Electrolyte imbalance # Hypokalemia, hypomagnesemia and hypophosphatemia Electrolytes repleted Monitor and replete as needed.   # Fall at home, initial encounter - Fall precaution - As needed Percocet for pain - As needed Robaxin    GERD/PUD Started PPI 40 mg IV twice daily for 2 days followed by oral pantoprazole  40 mg p.o. daily Continued Carafate  home dose   Essential hypertension:  Bps soft here, not on meds   DVT (deep venous thrombosis): on Eliquis    Cerebral palsy, unspecified:  -Fall precaution - Ritalin    Protein-calorie malnutrition, moderate:  Body weight 44.9 kg, BMI 18.11 - Continue Ensure - Nutrition consult   Body mass index is 18.11 kg/m.  Nutrition Problem: Moderate Malnutrition Etiology: chronic illness (Gitelman syndrome) Interventions: Interventions: Ensure Enlive (each supplement provides 350kcal and 20 grams of protein), MVI, Liberalize Diet   Diet: Regular diet DVT Prophylaxis: Therapeutic Anticoagulation with Eliquis     Advance goals of care discussion: Full code  Family Communication: family was present at bedside, at  the time of interview.  The pt provided permission to discuss medical plan with the family. Opportunity was given to ask question and all questions were answered satisfactorily.   Disposition:  Pt is from home, admitted with constipation and electrolyte abnormality, on 5/2 spiked low-grade fever, feeling hot and not good, so started IV ceftriaxone , urine culture pending, which precludes a safe  discharge. Discharge to home, when stable, most likely in 1 to 2 days.  Subjective: No significant events overnight, early morning patient was feeling hot, she had low-grade fever.  Overall she does not feel good. We will continue to monitor today and plan for discharge tomorrow a.m. if remains stable   Physical Exam: General: NAD, lying comfortably Appear in no distress, affect appropriate Eyes: PERRLA ENT: Oral Mucosa Clear, moist  Neck: no JVD,  Cardiovascular: S1 and S2 Present, no Murmur,  Respiratory: good respiratory effort, Bilateral Air entry equal and Decreased, no Crackles, no wheezes Abdomen: Bowel Sound present, Soft and no tenderness,  Skin: no rashes Extremities: no Pedal edema, no calf tenderness Neurologic: without any new focal findings Gait not checked due to patient safety concerns  Vitals:   04/13/24 0310 04/13/24 0836 04/13/24 0844 04/13/24 1003  BP: (!) 94/55  (!) 85/65 106/67  Pulse: 86  88 88  Resp: 18  15   Temp: (!) 100.4 F (38 C) 98.5 F (36.9 C) 97.8 F (36.6 C)   TempSrc: Oral Oral Oral   SpO2: 99%  100%   Weight:      Height:        Intake/Output Summary (Last 24 hours) at 04/13/2024 1508 Last data filed at 04/13/2024 1053 Gross per 24 hour  Intake 360 ml  Output 0 ml  Net 360 ml   Filed Weights   04/10/24 1418  Weight: 44.9 kg    Data Reviewed: I have personally reviewed and interpreted daily labs, tele strips, imagings as discussed above. I reviewed all nursing notes, pharmacy notes, vitals, pertinent old records I have discussed plan of care as described above with RN and patient/family.  CBC: Recent Labs  Lab 04/10/24 1427 04/11/24 0842 04/12/24 0551 04/13/24 1056  WBC 5.7 17.7* 5.3 4.1  HGB 11.6* 10.5* 9.7* 12.1  HCT 34.6* 31.9* 29.5* 37.3  MCV 77.9* 79.8* 78.5* 78.7*  PLT 157 124* 102* 123*   Basic Metabolic Panel: Recent Labs  Lab 04/10/24 1427 04/11/24 0842 04/12/24 0551 04/13/24 1056  NA 141 139 139 138  K  3.1* 2.9* 4.3 3.9  CL 102 106 111 101  CO2 26 24 25 26   GLUCOSE 100* 150* 102* 119*  BUN 18 21* 17 16  CREATININE 0.85 0.94 0.85 1.16*  CALCIUM  9.7 8.5* 8.6* 9.6  MG 1.1* 2.1 1.6* 1.7  PHOS 4.1 2.4* 4.4 3.5    Studies: No results found.  Scheduled Meds:  apixaban   5 mg Oral BID   bisacodyl   10 mg Oral BID   cyanocobalamin   1,000 mcg Oral Daily   feeding supplement  237 mL Oral BID BM   linaclotide   290 mcg Oral Daily   methylphenidate   20 mg Oral TID   multivitamin with minerals  1 tablet Oral Daily   pantoprazole   40 mg Oral Daily   polyethylene glycol  17 g Oral BID   pregabalin   150 mg Oral BID   sucralfate   1 g Oral QID   Continuous Infusions:  cefTRIAXone  (ROCEPHIN )  IV 1 g (04/13/24 1452)   magnesium  sulfate bolus IVPB 4  g (04/13/24 1443)   PRN Meds: acetaminophen , bisacodyl , hydrALAZINE , methocarbamol , morphine  injection, ondansetron  (ZOFRAN ) IV, oxyCODONE -acetaminophen   Time spent: 55 minutes  Author: Althia Atlas. MD [facility] 04/13/2024 3:08 PM  To reach On-call, see care teams to locate the attending and reach out to them via [email or web address]. If 7PM-7AM, please contact night-coverage If you still have difficulty reaching the attending provider, please page the [facility] (Director on Call) for [facility] on amion for assistance.

## 2024-04-13 NOTE — Plan of Care (Signed)
   Problem: Activity: Goal: Risk for activity intolerance will decrease Outcome: Progressing   Problem: Nutrition: Goal: Adequate nutrition will be maintained Outcome: Progressing   Problem: Elimination: Goal: Will not experience complications related to bowel motility Outcome: Progressing

## 2024-04-13 NOTE — Progress Notes (Signed)
   04/12/24 1928  Assess: if the MEWS score is Yellow or Red  Were vital signs accurate and taken at a resting state? Yes  Does the patient meet 2 or more of the SIRS criteria? No  MEWS guidelines implemented  Yes, yellow  Treat  MEWS Interventions Considered administering scheduled or prn medications/treatments as ordered  Take Vital Signs  Increase Vital Sign Frequency  Yellow: Q2hr x1, continue Q4hrs until patient remains green for 12hrs  Escalate  MEWS: Escalate Yellow: Discuss with charge nurse and consider notifying provider and/or RRT

## 2024-04-13 NOTE — Consult Note (Signed)
 PHARMACY CONSULT NOTE - FOLLOW UP  Pharmacy Consult for Electrolyte Monitoring and Replacement   Recent Labs: Potassium (mmol/L)  Date Value  04/13/2024 3.9  01/19/2015 3.4 (L)   Magnesium  (mg/dL)  Date Value  16/09/9603 1.7  01/19/2015 1.0 (L)   Calcium  (mg/dL)  Date Value  54/08/8118 9.6   Calcium , Total (mg/dL)  Date Value  14/78/2956 8.3 (L)   Albumin (g/dL)  Date Value  21/30/8657 4.1  06/14/2014 4.0   Phosphorus (mg/dL)  Date Value  84/69/6295 3.5   Sodium (mmol/L)  Date Value  04/13/2024 138  01/19/2015 142   Assessment: EW is a 43 yo female who presented to the ED due to abdominal pain and chest pain. They have a past medical history significant for Gitelman syndrome with chronic hypokalemia and hypomagnesemia. Pharmacy has been consulted to manage this patient's electrolytes.   Goal of Therapy:  Electrolytes WNL  Plan:  Mag 1.7 today ,consistent with Gitelman syndrome. Will replace today with MagSulfate 4 gm IV x 1.     All other electrolytes within normal limits this morning. Follow up AM labs  Thomasine Flick PharmD Clinical Pharmacist 04/13/2024

## 2024-04-14 DIAGNOSIS — K5900 Constipation, unspecified: Secondary | ICD-10-CM | POA: Diagnosis not present

## 2024-04-14 LAB — BASIC METABOLIC PANEL WITH GFR
Anion gap: 12 (ref 5–15)
BUN: 17 mg/dL (ref 6–20)
CO2: 27 mmol/L (ref 22–32)
Calcium: 9.3 mg/dL (ref 8.9–10.3)
Chloride: 99 mmol/L (ref 98–111)
Creatinine, Ser: 0.88 mg/dL (ref 0.44–1.00)
GFR, Estimated: >60 mL/min (ref >60)
Glucose, Bld: 107 mg/dL — ABNORMAL HIGH (ref 70–99)
Potassium: 3.7 mmol/L (ref 3.5–5.1)
Sodium: 138 mmol/L (ref 135–145)

## 2024-04-14 LAB — URINE CULTURE

## 2024-04-14 LAB — PHOSPHORUS: Phosphorus: 4.2 mg/dL (ref 2.5–4.6)

## 2024-04-14 LAB — CBC
HCT: 35.4 % — ABNORMAL LOW (ref 36.0–46.0)
Hemoglobin: 11.6 g/dL — ABNORMAL LOW (ref 12.0–15.0)
MCH: 25.7 pg — ABNORMAL LOW (ref 26.0–34.0)
MCHC: 32.8 g/dL (ref 30.0–36.0)
MCV: 78.5 fL — ABNORMAL LOW (ref 80.0–100.0)
Platelets: 128 10*3/uL — ABNORMAL LOW (ref 150–400)
RBC: 4.51 MIL/uL (ref 3.87–5.11)
RDW: 15.1 % (ref 11.5–15.5)
WBC: 3.5 10*3/uL — ABNORMAL LOW (ref 4.0–10.5)
nRBC: 0 % (ref 0.0–0.2)

## 2024-04-14 LAB — MAGNESIUM: Magnesium: 2.4 mg/dL (ref 1.7–2.4)

## 2024-04-14 MED ORDER — MIDODRINE HCL 5 MG PO TABS
5.0000 mg | ORAL_TABLET | Freq: Three times a day (TID) | ORAL | Status: AC
  Administered 2024-04-14 (×2): 5 mg via ORAL
  Filled 2024-04-14 (×2): qty 1

## 2024-04-14 MED ORDER — SODIUM CHLORIDE 0.9 % IV BOLUS
500.0000 mL | Freq: Once | INTRAVENOUS | Status: AC
  Administered 2024-04-14: 500 mL via INTRAVENOUS

## 2024-04-14 MED ORDER — MINERAL OIL RE ENEM: 1.0000 | ENEMA | Freq: Once | RECTAL | Status: DC

## 2024-04-14 NOTE — Care Management Important Message (Signed)
 Important Message  Patient Details  Name: CAELEY MCCLYMONDS MRN: [id number] Date of Birth: December 01, 1981   Important Message Given:  Yes - Medicare IM     Anise Kerns 04/14/2024, 1:06 PM

## 2024-04-14 NOTE — Consult Note (Signed)
 PHARMACY CONSULT NOTE - FOLLOW UP  Pharmacy Consult for Electrolyte Monitoring and Replacement   Recent Labs: Potassium (mmol/L)  Date Value  04/14/2024 3.7  01/19/2015 3.4 (L)   Magnesium  (mg/dL)  Date Value  16/09/9603 2.4  01/19/2015 1.0 (L)   Calcium  (mg/dL)  Date Value  54/08/8118 9.3   Calcium , Total (mg/dL)  Date Value  14/78/2956 8.3 (L)   Albumin (g/dL)  Date Value  21/30/8657 4.1  06/14/2014 4.0   Phosphorus (mg/dL)  Date Value  84/69/6295 4.2   Sodium (mmol/L)  Date Value  04/14/2024 138  01/19/2015 142   Assessment: [name] is a 43 yo female who presented to the ED due to abdominal pain and chest pain. They have a past medical history significant for Gitelman syndrome with chronic hypokalemia and hypomagnesemia. Pharmacy has been consulted to manage this patient's electrolytes.   Goal of Therapy:  Electrolytes WNL  Plan:  No electrolyte replacement warranted for today     All other electrolytes within normal limits this morning. Follow up AM labs  [name], PharmD, BCPS Clinical Pharmacist 04/14/2024

## 2024-04-14 NOTE — Plan of Care (Signed)
  Problem: Clinical Measurements: Goal: Diagnostic test results will improve Outcome: Progressing   Problem: Clinical Measurements: Goal: Will remain free from infection Outcome: Progressing   Problem: Clinical Measurements: Goal: Respiratory complications will improve Outcome: Progressing

## 2024-04-14 NOTE — Progress Notes (Signed)
 [facility] Progress Note  Patient: [name]    ZOX:[id number]  DOA: 04/10/2024     Date of Service: the patient was seen and examined on 04/14/2024  Chief Complaint  Patient presents with   Abdominal Pain   Constipation   Brief hospital course: [name] is a 43 y.o. female with medical history significant of Gitetlman syndrome, RSD (reflex sympathetic dystrophy), POTS, SA node dysfunction status post pacemaker placement, DVT on Eliquis , and cerebral palsy, chronic hypokalemia and hypomagnesemia, who present with constipation and abdominal pain.    Patient is hard to understand speech. Patient states that she has not had full BM for about 1 month, saying that she just has small amounts of bowel movement.  She has mild to moderate generalized abdominal pain.  She has nausea and vomited few times.  No diarrhea.  No fever or chills.  Patient does not have chest pain, cough, SOB. She states that she had a fall on Friday. She fell of her wheelchair, did hit her head, but did not pass out.  Has pain to right hip pain. She also has some rib cage pain on the right side.    Data reviewed independently and ED Course: pt was found to have WBC 5.7, potassium 3.1, magnesium  1.1, phosphorus 4.1, GFR> 60, troponin 3.  Temperature normal, blood pressure 143/99, heart rate 93, RR 20, oxygen saturation 100% on room air.  Chest x-ray negative.  X-ray of the right hip/pelvis is negative for fracture.  CT of head negative.  CT of abdomen/pelvis is negative for acute issues.  Patient is placed in telemetry bed for observation.     EKG: I have personally reviewed.  Sinus rhythm, QTc 445, early R wave progression.   Assessment and Plan:  # Constipation: Her abdominal pain is most likely due to constipation.  CT scan of abdomen/pelvis negative for acute findings. - Patient was given sorbitol  enema in ED - Start MiraLAX  twice daily, Linzess  daily Started Dulcolax 10 mg p.o. nightly and Dulcolax  suppository as needed Several Bms on 4/30, constipation appears resolved. Abd pain is resolved 5/2 no BM in the past 2 days, patient is refusing MiraLAX , does not like taste.  Advised to continue MiraLAX  along with grape juice, RN was advised.    # Pyuria, symptomatic Afebrile, temp 100.4, feels hot, does not feel good to go home on 5/2 UA positive Empirically started ceftriaxone  1 g IV daily for 3 days urine culture, multiple species, suggested recollection   # Electrolyte imbalance # Hypokalemia, hypomagnesemia and hypophosphatemia Electrolytes repleted Monitor and replete as needed.   # Fall at home, initial encounter - Fall precaution - As needed Percocet for pain - As needed Robaxin    GERD/PUD Started PPI 40 mg IV twice daily for 2 days followed by oral pantoprazole  40 mg p.o. daily Continued Carafate  home dose   Essential hypertension:  Bps soft here, not on meds 5/3 hypotension, BP improved after NS 500 mL bolus and midodrine  Monitor BP and titrate medication accordingly  DVT (deep venous thrombosis): on Eliquis    Cerebral palsy, unspecified:  -Fall precaution - Ritalin    Protein-calorie malnutrition, moderate:  Body weight 44.9 kg, BMI 18.11 - Continue Ensure - Nutrition consult   Body mass index is 18.11 kg/m.  Nutrition Problem: Moderate Malnutrition Etiology: chronic illness (Gitelman syndrome) Interventions: Interventions: Ensure Enlive (each supplement provides 350kcal and 20 grams of protein), MVI, Liberalize Diet   Diet: Regular diet DVT Prophylaxis: Therapeutic Anticoagulation with Eliquis   Advance goals of care discussion: Full code  Family Communication: family was present at bedside, at the time of interview.  The pt provided permission to discuss medical plan with the family. Opportunity was given to ask question and all questions were answered satisfactorily.   Disposition:  Pt is from home, admitted with constipation and electrolyte  abnormality, on 5/2 spiked low-grade fever, feeling hot and not good, so started IV ceftriaxone , urine culture pending, which precludes a safe discharge. Discharge to home, when stable, most likely in 1 to 2 days.  Subjective: No significant events overnight, patient is feeling little bit improvement, better than yesterday.  Denies any fever or chills.  No new complaints, she is still not back to her baseline.   Physical Exam: General: NAD, lying comfortably Appear in no distress, affect appropriate Eyes: PERRLA ENT: Oral Mucosa Clear, moist  Neck: no JVD,  Cardiovascular: S1 and S2 Present, no Murmur,  Respiratory: good respiratory effort, Bilateral Air entry equal and Decreased, no Crackles, no wheezes Abdomen: Bowel Sound present, Soft and no tenderness,  Skin: no rashes Extremities: no Pedal edema, no calf tenderness Neurologic: without any new focal findings Gait not checked due to patient safety concerns  Vitals:   04/13/24 1947 04/14/24 0759 04/14/24 1052 04/14/24 1637  BP: 109/73 (!) 85/55 112/68 92/67  Pulse: 88 88 87 85  Resp: 17 15  15   Temp: 97.6 F (36.4 C) (!) 97.5 F (36.4 C)  (!) 97.5 F (36.4 C)  TempSrc: Axillary     SpO2: 100% 98%  99%  Weight:      Height:        Intake/Output Summary (Last 24 hours) at 04/14/2024 1642 Last data filed at 04/14/2024 1012 Gross per 24 hour  Intake 700 ml  Output --  Net 700 ml   Filed Weights   04/10/24 1418  Weight: 44.9 kg    Data Reviewed: I have personally reviewed and interpreted daily labs, tele strips, imagings as discussed above. I reviewed all nursing notes, pharmacy notes, vitals, pertinent old records I have discussed plan of care as described above with RN and patient/family.  CBC: Recent Labs  Lab 04/10/24 1427 04/11/24 0842 04/12/24 0551 04/13/24 1056 04/14/24 0506  WBC 5.7 17.7* 5.3 4.1 3.5*  HGB 11.6* 10.5* 9.7* 12.1 11.6*  HCT 34.6* 31.9* 29.5* 37.3 35.4*  MCV 77.9* 79.8* 78.5* 78.7* 78.5*   PLT 157 124* 102* 123* 128*   Basic Metabolic Panel: Recent Labs  Lab 04/10/24 1427 04/11/24 0842 04/12/24 0551 04/13/24 1056 04/14/24 0506  NA 141 139 139 138 138  K 3.1* 2.9* 4.3 3.9 3.7  CL 102 106 111 101 99  CO2 26 24 25 26 27   GLUCOSE 100* 150* 102* 119* 107*  BUN 18 21* 17 16 17   CREATININE 0.85 0.94 0.85 1.16* 0.88  CALCIUM  9.7 8.5* 8.6* 9.6 9.3  MG 1.1* 2.1 1.6* 1.7 2.4  PHOS 4.1 2.4* 4.4 3.5 4.2    Studies: No results found.  Scheduled Meds:  apixaban   5 mg Oral BID   bisacodyl   10 mg Oral BID   cyanocobalamin   1,000 mcg Oral Daily   feeding supplement  237 mL Oral BID BM   linaclotide   290 mcg Oral Daily   methylphenidate   20 mg Oral TID   midodrine   5 mg Oral TID WC   multivitamin with minerals  1 tablet Oral Daily   pantoprazole   40 mg Oral Daily   polyethylene glycol  17 g  Oral BID   pregabalin   150 mg Oral BID   sucralfate   1 g Oral QID   Continuous Infusions:  cefTRIAXone  (ROCEPHIN )  IV 1 g (04/14/24 1221)   PRN Meds: acetaminophen , bisacodyl , hydrALAZINE , methocarbamol , morphine  injection, ondansetron  (ZOFRAN ) IV, oxyCODONE -acetaminophen   Time spent: 55 minutes  Author: Althia Atlas. MD [facility] 04/14/2024 4:42 PM  To reach On-call, see care teams to locate the attending and reach out to them via [email or web address]. If 7PM-7AM, please contact night-coverage If you still have difficulty reaching the attending provider, please page the [facility] (Director on Call) for [facility] on amion for assistance.

## 2024-04-15 ENCOUNTER — Inpatient Hospital Stay: Payer: Medicare (Managed Care)

## 2024-04-15 DIAGNOSIS — K5900 Constipation, unspecified: Secondary | ICD-10-CM | POA: Diagnosis not present

## 2024-04-15 LAB — PHOSPHORUS: Phosphorus: 4.1 mg/dL (ref 2.5–4.6)

## 2024-04-15 LAB — BASIC METABOLIC PANEL WITH GFR
Anion gap: 10 (ref 5–15)
BUN: 16 mg/dL (ref 6–20)
CO2: 28 mmol/L (ref 22–32)
Calcium: 9.3 mg/dL (ref 8.9–10.3)
Chloride: 101 mmol/L (ref 98–111)
Creatinine, Ser: 0.75 mg/dL (ref 0.44–1.00)
GFR, Estimated: >60 mL/min (ref >60)
Glucose, Bld: 105 mg/dL — ABNORMAL HIGH (ref 70–99)
Potassium: 3.8 mmol/L (ref 3.5–5.1)
Sodium: 139 mmol/L (ref 135–145)

## 2024-04-15 LAB — CBC
HCT: 35 % — ABNORMAL LOW (ref 36.0–46.0)
Hemoglobin: 11.5 g/dL — ABNORMAL LOW (ref 12.0–15.0)
MCH: 25.6 pg — ABNORMAL LOW (ref 26.0–34.0)
MCHC: 32.9 g/dL (ref 30.0–36.0)
MCV: 78 fL — ABNORMAL LOW (ref 80.0–100.0)
Platelets: 140 10*3/uL — ABNORMAL LOW (ref 150–400)
RBC: 4.49 MIL/uL (ref 3.87–5.11)
RDW: 14.9 % (ref 11.5–15.5)
WBC: 3.6 10*3/uL — ABNORMAL LOW (ref 4.0–10.5)
nRBC: 0 % (ref 0.0–0.2)

## 2024-04-15 LAB — MAGNESIUM: Magnesium: 1.5 mg/dL — ABNORMAL LOW (ref 1.7–2.4)

## 2024-04-15 MED ORDER — SODIUM CHLORIDE 0.9 % IV SOLN: 12.5000 mg | Freq: Four times a day (QID) | INTRAVENOUS | Status: DC | PRN

## 2024-04-15 MED ORDER — BISACODYL 10 MG RE SUPP
10.0000 mg | Freq: Once | RECTAL | Status: AC
  Administered 2024-04-15: 10 mg via RECTAL
  Filled 2024-04-15: qty 1

## 2024-04-15 MED ORDER — MINERAL OIL RE ENEM: 1.0000 | ENEMA | Freq: Once | RECTAL | Status: DC

## 2024-04-15 MED ORDER — MAGNESIUM SULFATE 4 GM/100ML IV SOLN
4.0000 g | Freq: Once | INTRAVENOUS | Status: AC
  Administered 2024-04-15: 4 g via INTRAVENOUS
  Filled 2024-04-15: qty 100

## 2024-04-15 NOTE — Consult Note (Signed)
 PHARMACY CONSULT NOTE - FOLLOW UP  Pharmacy Consult for Electrolyte Monitoring and Replacement   Recent Labs: Potassium (mmol/L)  Date Value  04/15/2024 3.8  01/19/2015 3.4 (L)   Magnesium  (mg/dL)  Date Value  16/09/9603 1.5 (L)  01/19/2015 1.0 (L)   Calcium  (mg/dL)  Date Value  54/08/8118 9.3   Calcium , Total (mg/dL)  Date Value  14/78/2956 8.3 (L)   Albumin (g/dL)  Date Value  21/30/8657 4.1  06/14/2014 4.0   Phosphorus (mg/dL)  Date Value  84/69/6295 4.1   Sodium (mmol/L)  Date Value  04/15/2024 139  01/19/2015 142   Assessment: [name] is a 43 yo female who presented to the ED due to abdominal pain and chest pain. They have a past medical history significant for Gitelman syndrome with chronic hypokalemia and hypomagnesemia. Pharmacy has been consulted to manage this patient's electrolytes.   Goal of Therapy:  Electrolytes WNL  Plan:  Mag 1.5   Will order Magnesium  sulfate 4 gram IV x1  Follow up electrolytes with AM labs  Thomasine Flick PharmD Clinical Pharmacist 04/15/2024

## 2024-04-15 NOTE — Progress Notes (Signed)
 [facility] Progress Note  Patient: [name]    XBM:[id number]  DOA: 04/10/2024     Date of Service: the patient was seen and examined on 04/15/2024  Chief Complaint  Patient presents with   Abdominal Pain   Constipation   Brief hospital course: [name] is a 43 y.o. female with medical history significant of Gitetlman syndrome, RSD (reflex sympathetic dystrophy), POTS, SA node dysfunction status post pacemaker placement, DVT on Eliquis , and cerebral palsy, chronic hypokalemia and hypomagnesemia, who present with constipation and abdominal pain.    Patient is hard to understand speech. Patient states that she has not had full BM for about 1 month, saying that she just has small amounts of bowel movement.  She has mild to moderate generalized abdominal pain.  She has nausea and vomited few times.  No diarrhea.  No fever or chills.  Patient does not have chest pain, cough, SOB. She states that she had a fall on Friday. She fell of her wheelchair, did hit her head, but did not pass out.  Has pain to right hip pain. She also has some rib cage pain on the right side.    Data reviewed independently and ED Course: pt was found to have WBC 5.7, potassium 3.1, magnesium  1.1, phosphorus 4.1, GFR> 60, troponin 3.  Temperature normal, blood pressure 143/99, heart rate 93, RR 20, oxygen saturation 100% on room air.  Chest x-ray negative.  X-ray of the right hip/pelvis is negative for fracture.  CT of head negative.  CT of abdomen/pelvis is negative for acute issues.  Patient is placed in telemetry bed for observation.     EKG: I have personally reviewed.  Sinus rhythm, QTc 445, early R wave progression.   Assessment and Plan:  # Constipation: Her abdominal pain is most likely due to constipation.  CT scan of abdomen/pelvis negative for acute findings. - Patient was given sorbitol  enema in ED - Start MiraLAX  twice daily, Linzess  daily Started Dulcolax 10 mg p.o. nightly and Dulcolax  suppository as needed Several Bms on 4/30, constipation appears resolved. Abd pain is resolved 5/2 no BM in the past 2 days, patient is refusing MiraLAX , does not like taste.  Advised to continue MiraLAX  along with grape juice, RN was advised. 5/4 Dulcolax suppository given, patient did move bowels today Patient is refusing to take oral laxatives, so I would suggest to use Dulcolax suppository 3 times a week  # Pyuria, symptomatic Afebrile, temp 100.4, feels hot, does not feel good to go home on 5/2 UA positive Empirically started ceftriaxone  1 g IV daily for 3 days urine culture, multiple species, suggested recollection   # Electrolyte imbalance # Hypokalemia, hypomagnesemia and hypophosphatemia Electrolytes repleted Monitor and replete as needed.   # Fall at home, initial encounter - Fall precaution - As needed Percocet for pain - As needed Robaxin    # GERD/PUD Started PPI 40 mg IV twice daily for 2 days followed by oral pantoprazole  40 mg p.o. daily Continued Carafate  home dose   # Essential hypertension:  Bps soft here, not on meds 5/3 hypotension, BP improved after NS 500 mL bolus and midodrine  Monitor BP and titrate medication accordingly  # DVT (deep venous thrombosis): on Eliquis    # Cerebral palsy, unspecified:  -Fall precaution - Ritalin    # Protein-calorie malnutrition, moderate:  Body weight 44.9 kg, BMI 18.11 - Continue Ensure - Nutrition consult   Body mass index is 18.11 kg/m.  Nutrition Problem: Moderate Malnutrition Etiology:  chronic illness (Gitelman syndrome) Interventions: Interventions: Ensure Enlive (each supplement provides 350kcal and 20 grams of protein), MVI, Liberalize Diet   Diet: Regular diet DVT Prophylaxis: Therapeutic Anticoagulation with Eliquis     Advance goals of care discussion: Full code  Family Communication: family was present at bedside, at the time of interview.  The pt provided permission to discuss medical plan with  the family. Opportunity was given to ask question and all questions were answered satisfactorily.   Disposition:  Pt is from home, admitted with constipation and electrolyte abnormality, on 5/2 spiked low-grade fever, feeling hot and not good, so started IV ceftriaxone , urine culture negative.  Clinically stable, medically optimized. Discharge to home, most likely tomorrow a.m. if remains stable.    Subjective: No significant events overnight, patient was feeling nauseous and has not moved bowels yet.  Patient is refusing oral meds because she thinks that she is cannot throw up Patient agreed for suppository followed by enema if needed After suppository as per RN patient did move bowels.    Physical Exam: General: NAD, lying comfortably Appear in no distress, affect appropriate Eyes: PERRLA ENT: Oral Mucosa Clear, moist  Neck: no JVD,  Cardiovascular: S1 and S2 Present, no Murmur,  Respiratory: good respiratory effort, Bilateral Air entry equal and Decreased, no Crackles, no wheezes Abdomen: Bowel Sound present, Soft and no tenderness,  Skin: no rashes Extremities: no Pedal edema, no calf tenderness Neurologic: without any new focal findings Gait not checked due to patient safety concerns  Vitals:   04/15/24 0039 04/15/24 0359 04/15/24 0747 04/15/24 1446  BP: 94/60 90/64 99/69  122/81  Pulse: 89 92 87 90  Resp: 16 19 16 18   Temp: (!) 97.4 F (36.3 C) 97.6 F (36.4 C)  (!) 97.4 F (36.3 C)  TempSrc:      SpO2: 99% 97% 100% 100%  Weight:      Height:        Intake/Output Summary (Last 24 hours) at 04/15/2024 1530 Last data filed at 04/14/2024 1920 Gross per 24 hour  Intake 90 ml  Output --  Net 90 ml   Filed Weights   04/10/24 1418  Weight: 44.9 kg    Data Reviewed: I have personally reviewed and interpreted daily labs, tele strips, imagings as discussed above. I reviewed all nursing notes, pharmacy notes, vitals, pertinent old records I have discussed plan of care  as described above with RN and patient/family.  CBC: Recent Labs  Lab 04/11/24 0842 04/12/24 0551 04/13/24 1056 04/14/24 0506 04/15/24 0438  WBC 17.7* 5.3 4.1 3.5* 3.6*  HGB 10.5* 9.7* 12.1 11.6* 11.5*  HCT 31.9* 29.5* 37.3 35.4* 35.0*  MCV 79.8* 78.5* 78.7* 78.5* 78.0*  PLT 124* 102* 123* 128* 140*   Basic Metabolic Panel: Recent Labs  Lab 04/11/24 0842 04/12/24 0551 04/13/24 1056 04/14/24 0506 04/15/24 0438  NA 139 139 138 138 139  K 2.9* 4.3 3.9 3.7 3.8  CL 106 111 101 99 101  CO2 24 25 26 27 28   GLUCOSE 150* 102* 119* 107* 105*  BUN 21* 17 16 17 16   CREATININE 0.94 0.85 1.16* 0.88 0.75  CALCIUM  8.5* 8.6* 9.6 9.3 9.3  MG 2.1 1.6* 1.7 2.4 1.5*  PHOS 2.4* 4.4 3.5 4.2 4.1    Studies: DG Abd 2 Views Result Date: 04/15/2024 CLINICAL DATA:  Constipation EXAM: ABDOMEN - 2 VIEW COMPARISON:  04/11/2024. FINDINGS: The bowel gas pattern is normal. There is no evidence of free air. No radio-opaque calculi or other significant  radiographic abnormality is seen. Electronic stimulation device right upper quadrant with wires extending to the thoracic spine. IMPRESSION: Negative. Electronically Signed   By: Sydell Eva [name]   On: 04/15/2024 09:16    Scheduled Meds:  apixaban   5 mg Oral BID   bisacodyl   10 mg Oral BID   cyanocobalamin   1,000 mcg Oral Daily   feeding supplement  237 mL Oral BID BM   linaclotide   290 mcg Oral Daily   methylphenidate   20 mg Oral TID   multivitamin with minerals  1 tablet Oral Daily   pantoprazole   40 mg Oral Daily   polyethylene glycol  17 g Oral BID   pregabalin   150 mg Oral BID   sucralfate   1 g Oral QID   Continuous Infusions:  promethazine  (PHENERGAN ) injection (IM or IVPB)     PRN Meds: acetaminophen , bisacodyl , hydrALAZINE , methocarbamol , morphine  injection, ondansetron  (ZOFRAN ) IV, oxyCODONE -acetaminophen , promethazine  (PHENERGAN ) injection (IM or IVPB)  Time spent: 40 minutes  Author: Althia Atlas. MD [facility] 04/15/2024  3:30 PM  To reach On-call, see care teams to locate the attending and reach out to them via [email or web address]. If 7PM-7AM, please contact night-coverage If you still have difficulty reaching the attending provider, please page the [facility] (Director on Call) for [facility] on amion for assistance.

## 2024-04-15 NOTE — Plan of Care (Signed)
  Problem: Clinical Measurements: Goal: Diagnostic test results will improve Outcome: Progressing   Problem: Activity: Goal: Risk for activity intolerance will decrease Outcome: Progressing   Problem: Coping: Goal: Level of anxiety will decrease Outcome: Progressing   Problem: Safety: Goal: Ability to remain free from injury will improve Outcome: Progressing   

## 2024-04-16 ENCOUNTER — Other Ambulatory Visit: Payer: Self-pay

## 2024-04-16 DIAGNOSIS — K5900 Constipation, unspecified: Secondary | ICD-10-CM | POA: Diagnosis not present

## 2024-04-16 LAB — BASIC METABOLIC PANEL WITH GFR
Anion gap: 12 (ref 5–15)
BUN: 16 mg/dL (ref 6–20)
CO2: 25 mmol/L (ref 22–32)
Calcium: 9.1 mg/dL (ref 8.9–10.3)
Chloride: 100 mmol/L (ref 98–111)
Creatinine, Ser: 0.79 mg/dL (ref 0.44–1.00)
GFR, Estimated: >60 mL/min (ref >60)
Glucose, Bld: 104 mg/dL — ABNORMAL HIGH (ref 70–99)
Potassium: 3.4 mmol/L — ABNORMAL LOW (ref 3.5–5.1)
Sodium: 137 mmol/L (ref 135–145)

## 2024-04-16 LAB — CBC
HCT: 36.7 % (ref 36.0–46.0)
Hemoglobin: 12.1 g/dL (ref 12.0–15.0)
MCH: 25.2 pg — ABNORMAL LOW (ref 26.0–34.0)
MCHC: 33 g/dL (ref 30.0–36.0)
MCV: 76.3 fL — ABNORMAL LOW (ref 80.0–100.0)
Platelets: 138 K/uL — ABNORMAL LOW (ref 150–400)
RBC: 4.81 MIL/uL (ref 3.87–5.11)
RDW: 14.4 % (ref 11.5–15.5)
WBC: 3.2 K/uL — ABNORMAL LOW (ref 4.0–10.5)
nRBC: 0 % (ref 0.0–0.2)

## 2024-04-16 LAB — MAGNESIUM: Magnesium: 2 mg/dL (ref 1.7–2.4)

## 2024-04-16 LAB — PHOSPHORUS: Phosphorus: 3.7 mg/dL (ref 2.5–4.6)

## 2024-04-16 MED ORDER — BISACODYL 10 MG RE SUPP
10.0000 mg | Freq: Every day | RECTAL | 3 refills | Status: DC | PRN
  Filled 2024-04-16: qty 12, 12d supply, fill #0

## 2024-04-16 MED ORDER — BISACODYL 5 MG PO TBEC
5.0000 mg | DELAYED_RELEASE_TABLET | Freq: Every day | ORAL | 11 refills | Status: DC
Start: 2024-04-16 — End: 2024-07-04
  Filled 2024-04-16: qty 60, 30d supply, fill #0

## 2024-04-16 MED ORDER — POTASSIUM CHLORIDE CRYS ER 20 MEQ PO TBCR
40.0000 meq | EXTENDED_RELEASE_TABLET | Freq: Once | ORAL | Status: AC
  Administered 2024-04-16: 40 meq via ORAL
  Filled 2024-04-16: qty 2

## 2024-04-16 NOTE — Plan of Care (Signed)
  Problem: Education: Goal: Knowledge of General Education information will improve Description: Including pain rating scale, medication(s)/side effects and non-pharmacologic comfort measures 04/16/2024 0035 by Rayleen Cal, RN Outcome: Progressing 04/16/2024 0035 by Rayleen Cal, RN Outcome: Progressing   Problem: Health Behavior/Discharge Planning: Goal: Ability to manage health-related needs will improve 04/16/2024 0035 by Rayleen Cal, RN Outcome: Progressing 04/16/2024 0035 by Rayleen Cal, RN Outcome: Progressing   Problem: Clinical Measurements: Goal: Ability to maintain clinical measurements within normal limits will improve 04/16/2024 0035 by Rayleen Cal, RN Outcome: Progressing 04/16/2024 0035 by Rayleen Cal, RN Outcome: Progressing Goal: Will remain free from infection 04/16/2024 0035 by Rayleen Cal, RN Outcome: Progressing 04/16/2024 0035 by Rayleen Cal, RN Outcome: Progressing Goal: Diagnostic test results will improve 04/16/2024 0035 by Rayleen Cal, RN Outcome: Progressing 04/16/2024 0035 by Rayleen Cal, RN Outcome: Progressing Goal: Respiratory complications will improve 04/16/2024 0035 by Rayleen Cal, RN Outcome: Progressing 04/16/2024 0035 by Rayleen Cal, RN Outcome: Progressing Goal: Cardiovascular complication will be avoided 04/16/2024 0035 by Rayleen Cal, RN Outcome: Progressing 04/16/2024 0035 by Rayleen Cal, RN Outcome: Progressing   Problem: Activity: Goal: Risk for activity intolerance will decrease 04/16/2024 0035 by Rayleen Cal, RN Outcome: Progressing 04/16/2024 0035 by Rayleen Cal, RN Outcome: Progressing   Problem: Nutrition: Goal: Adequate nutrition will be maintained 04/16/2024 0035 by Rayleen Cal, RN Outcome: Progressing 04/16/2024 0035 by Rayleen Cal, RN Outcome: Progressing   Problem: Coping: Goal: Level of anxiety will decrease 04/16/2024 0035 by Rayleen Cal, RN Outcome: Progressing 04/16/2024 0035 by Rayleen Cal, RN Outcome: Progressing   Problem: Elimination: Goal: Will not experience complications related to bowel motility 04/16/2024 0035 by Rayleen Cal, RN Outcome: Progressing 04/16/2024 0035 by Rayleen Cal, RN Outcome: Progressing Goal: Will not experience complications related to urinary retention 04/16/2024 0035 by Rayleen Cal, RN Outcome: Progressing 04/16/2024 0035 by Rayleen Cal, RN Outcome: Progressing   Problem: Pain Managment: Goal: General experience of comfort will improve and/or be controlled 04/16/2024 0035 by Rayleen Cal, RN Outcome: Progressing 04/16/2024 0035 by Rayleen Cal, RN Outcome: Progressing   Problem: Safety: Goal: Ability to remain free from injury will improve 04/16/2024 0035 by Rayleen Cal, RN Outcome: Progressing 04/16/2024 0035 by Rayleen Cal, RN Outcome: Progressing   Problem: Skin Integrity: Goal: Risk for impaired skin integrity will decrease 04/16/2024 0035 by Rayleen Cal, RN Outcome: Progressing 04/16/2024 0035 by Rayleen Cal, RN Outcome: Progressing

## 2024-04-16 NOTE — Consult Note (Signed)
 PHARMACY CONSULT NOTE - FOLLOW UP  Pharmacy Consult for Electrolyte Monitoring and Replacement   Recent Labs: Potassium (mmol/L)  Date Value  04/16/2024 3.4 (L)  01/19/2015 3.4 (L)   Magnesium  (mg/dL)  Date Value  52/84/1324 2.0  01/19/2015 1.0 (L)   Calcium  (mg/dL)  Date Value  40/09/2724 9.1   Calcium , Total (mg/dL)  Date Value  36/64/4034 8.3 (L)   Albumin (g/dL)  Date Value  74/25/9563 4.1  06/14/2014 4.0   Phosphorus (mg/dL)  Date Value  87/56/4332 3.7   Sodium (mmol/L)  Date Value  04/16/2024 137  01/19/2015 142   Assessment: EW is a 43 yo female who presented to the ED due to abdominal pain and chest pain. They have a past medical history significant for Gitelman syndrome with chronic hypokalemia and hypomagnesemia. Pharmacy has been consulted to manage this patient's electrolytes.   Goal of Therapy:  Electrolytes WNL  Plan:  K 3.4- will order KCL 40 mEq PO x 1    Follow up electrolytes with AM labs  Genoveva Kidney, PharmD Clinical Pharmacist 04/16/2024

## 2024-04-16 NOTE — TOC Transition Note (Signed)
 Transition of Care [facility]) - Discharge Note   Patient Details  Name: [name] MRN: [id number] Date of Birth: 1981/04/30  [facility] CM/SW Contact:  Alexandra Ice, RN Phone Number: 04/16/2024, 11:40 AM   Clinical Narrative:     Received call from mother, Xion Michalak. She stated patient has arrange transportation with [facility] for today and they are picking her at 12:45pm. Bedside nurse is attempting to contact MD to see if he will discharge her. TOC sent message to MD notifying him that patient and family have arranged transportation and it will be here at 12:45pm, and is requesting to be discharged. Discharge orders placed. No TOC needs identified.   Final next level of care: Home/Self Care Barriers to Discharge: Barriers Resolved   Patient Goals and CMS Choice Patient states their goals for this hospitalization and ongoing recovery are:: go home          Discharge Placement                Patient to be transferred to facility by: [facility] Name of family member notified: Caydin Alejos Patient and family notified of of transfer: 04/16/24  Discharge Plan and Services Additional resources added to the After Visit Summary for                    DME Agency: NA       HH Arranged: NA          Social Drivers of Health (SDOH) Interventions SDOH Screenings   Food Insecurity: No Food Insecurity (04/14/2024)   Received from [facility]  Housing: High Risk (04/14/2024)   Received from [facility]  Transportation Needs: Unmet Transportation Needs (04/14/2024)   Received from [facility]  Utilities: Not At Risk (04/14/2024)   Received from [facility]  Financial Resource Strain: Low Risk  (04/14/2024)   [facility]  Social Connections: Patient Declined (04/10/2024)  Stress: Stress Concern Present (07/20/2022)   Received  from [facility], [facility]  Tobacco Use: Low Risk  (04/10/2024)     Readmission Risk Interventions     No data to display

## 2024-04-16 NOTE — Discharge Summary (Signed)
 [facility] Discharge Summary   Patient: [name] RSW:[id number]  PCP: Baltazar Leventhal, MD  Date of admission: 04/10/2024   Date of discharge:  04/16/2024     Discharge Diagnoses:  Principal Problem:   Constipation Active Problems:   Electrolyte disturbance   Hypomagnesemia   Hypokalemia   Fall at home, initial encounter   Essential hypertension   DVT (deep venous thrombosis) (HCC)   Cerebral palsy, unspecified (HCC)   Protein-calorie malnutrition, moderate (HCC)   Admitted From: Home Disposition:  Home   Recommendations for Outpatient Follow-up:  Follow-up with PCP in 1 week, repeat CBC and BMP in 1 week to check electrolytes Follow up LABS/TEST:  cbc and bmp in 1 wk   Follow-up Information     Baltazar Leventhal, MD Follow up in 1 week(s).   Specialty: Family Medicine Contact information: [address] [place] [address] [phone]                Diet recommendation: Regular diet  Activity: The patient is advised to gradually reintroduce usual activities, as tolerated  Discharge Condition: stable  Code Status: Full code   History of present illness: As per the H and P dictated on admission.  Hospital Course:  [name] is a 43 y.o. female with medical history significant of Gitetlman syndrome, RSD (reflex sympathetic dystrophy), POTS, SA node dysfunction status post pacemaker placement, DVT on Eliquis , and cerebral palsy, chronic hypokalemia and hypomagnesemia, who present with constipation and abdominal pain.    Patient is hard to understand speech. Patient states that she has not had full BM for about 1 month, saying that she just has small amounts of bowel movement.  She has mild to moderate generalized abdominal pain.  She has nausea and vomited few times.  No diarrhea.  No fever or chills.  Patient does not have chest pain, cough, SOB. She states that she had a fall on Friday. She fell of her wheelchair, did hit her  head, but did not pass out.  Has pain to right hip pain. She also has some rib cage pain on the right side.    Data reviewed independently and ED Course: pt was found to have WBC 5.7, potassium 3.1, magnesium  1.1, phosphorus 4.1, GFR> 60, troponin 3.  Temperature normal, blood pressure 143/99, heart rate 93, RR 20, oxygen saturation 100% on room air.  Chest x-ray negative.  X-ray of the right hip/pelvis is negative for fracture.  CT of head negative.  CT of abdomen/pelvis is negative for acute issues.  Patient is placed in telemetry bed for observation.   EKG: I have personally reviewed.  Sinus rhythm, QTc 445, early R wave progression.     Assessment and Plan:   # Constipation: Her abdominal pain is most likely due to constipation.  CT scan of abdomen/pelvis negative for acute findings. Patient was given sorbitol  enema in ED Start MiraLAX  twice daily, Linzess  daily. Started Dulcolax 10 mg p.o. nightly and Dulcolax suppository as needed. Several Bms on 4/30, constipation appears resolved. Abd pain is resolved 5/2 no BM in the past 2 days, patient is refusing MiraLAX , does not like taste.  Advised to continue MiraLAX  along with grape juice, RN was advised. On 5/4 Dulcolax suppository given, patient did move bowels today. Patient is refusing to take oral laxatives, so I would suggest to use Dulcolax suppository 3 times a week.    # Pyuria, symptomatic Afebrile, temp 100.4, feels hot, does not feel good  to go home on 5/2 UA positive. S/p Empirically ceftriaxone  1 g IV daily for 3 days urine culture, multiple species, suggested recollection.   # Electrolyte imbalance # Hypokalemia, hypomagnesemia and hypophosphatemia Electrolytes repleted. Repeat BMP in 1 wk   # Fall at home, initial encounter Continue Fall precaution, prn Percocet for pain and prn Robaxin  # GERD/PUD, s/p PPI 40 mg IV twice daily for 2 days followed by oral pantoprazole  40 mg p.o. daily. Continued Carafate  home dose # Essential  hypertension: Bps soft here, not on meds 5/3 hypotension, BP improved after NS 500 mL bolus and midodrine  Monitor BP and titrate medication accordingly   # DVT (deep venous thrombosis): on Eliquis  # Cerebral palsy, unspecified: Fall precaution, on Ritalin   # Protein-calorie malnutrition, moderate:  Body mass index is 18.11 kg/m.  Nutrition Problem: Moderate Malnutrition Etiology: chronic illness (Gitelman syndrome) Nutrition Interventions: Interventions: Ensure Enlive (each supplement provides 350kcal and 20 grams of protein), MVI, Liberalize Diet  Patient is back to her baseline, she would like to continue.   Patient is wheelchair-bound, lives with her mother.  Transportation was arranged.  Advised to follow with PCP and repeat labs in 1 week.  Consultants: None Procedures: None  Discharge Exam: General: Appear in no distress, no Rash; Oral Mucosa Clear, moist. Cardiovascular: S1 and S2 Present, no Murmur, Respiratory: normal respiratory effort, Bilateral Air entry present and no Crackles, no wheezes Abdomen: Bowel Sound present, Soft and no tenderness, no hernia Extremities: no Pedal edema, no calf tenderness Neurology: Cerebral palsy and Gitetlman syndrome, wheel chair bound affect appropriate.  Filed Weights   04/10/24 1418  Weight: 44.9 kg   Vitals:   04/16/24 0533 04/16/24 0842  BP: 97/78 112/73  Pulse: 87 90  Resp: 15 16  Temp: (!) 97.4 F (36.3 C) (!) 97.1 F (36.2 C)  SpO2: 98% 99%    DISCHARGE MEDICATION: Allergies as of 04/16/2024       Reactions   Duloxetine    Other reaction(s): Other (See Comments), Palpitations Other reaction(s): Hallucinations, Hyperactive behavior (finding), Other (See Comments) Stressed out   Flecainide    Other reaction(s): Other (See Comments) Other reaction(s): Hypomagnesemia (disorder), Other (See Comments) Other Reaction: QUESTION HYPOMAGNESEMIA   Fluoxetine    Other reaction(s): Nausea And Vomiting, Other (See  Comments) Other reaction(s): Other (See Comments) Other Reaction: INCREASED HR  causes irritability and irrational behavior "I go crazy"   Gabapentin    Goes crazy Other reaction(s): Other (See Comments) Other reaction(s): Hyperactive behavior (finding), Mental Status Changes (intolerance), Other (See Comments) Goes crazy Hallucinations   Baclofen    Other reaction(s): Other (See Comments), Other (See Comments) Weakness  Other reaction(s): Other (See Comments) Weakness and loose Weakness  Other reaction(s):  Weakness    Cephalosporins    Other reaction(s): Hives, Rash Received pre-op vancomycin and cefazolin 07/20/16. Developed urticarial rash on bilateral lower extremities + slight periorbital swelling. Treated with diphenhydramine . Received pre-op vancomycin and cefazolin 07/20/16. Developed urticarial rash on bilateral lower extremities + slight periorbital swelling. Treated with diphenhydramine . Received pre-op vancomycin and cefazolin 07/20/16. Developed urticarial rash on bilateral lower extremities + slight periorbital swelling. Treated with diphenhydramine .   Sulfasalazine    Other reaction(s): Nausea And Vomiting   Sulfa Antibiotics Nausea And Vomiting   Vancomycin    Other reaction(s): Hives Received pre-op vancomycin and cefazolin 07/20/16. Developed urticarial rash on bilateral lower extremities + slight periorbital swelling. Treated with diphenhydramine . Received pre-op vancomycin and cefazolin 07/20/16. Developed urticarial rash on bilateral lower extremities +  slight periorbital swelling. Treated with diphenhydramine . Received pre-op vancomycin and cefazolin 07/20/16. Developed urticarial rash on bilateral lower extremities + slight periorbital swelling. Treated with diphenhydramine .   Cefazolin Rash   Tolerated ceftriaxone  2023 and cephalexin  2022        Medication List     TAKE these medications    acetaminophen  325 MG tablet Commonly known as: TYLENOL  Take 2 tablets  (650 mg total) by mouth every 6 (six) hours as needed for mild pain (pain score 1-3) or fever.   bisacodyl  5 MG EC tablet Commonly known as: DULCOLAX Take 1-2 tablets (5-10 mg total) by mouth at bedtime.   bisacodyl  10 MG suppository Commonly known as: DULCOLAX Place 1 suppository (10 mg total) rectally daily as needed for severe constipation.   cyanocobalamin  1000 MCG tablet Commonly known as: VITAMIN B12 Take 1,000 mcg by mouth daily.   D2000 Ultra Strength 50 MCG (2000 UT) Caps Generic drug: Cholecalciferol  Take 1 tablet by mouth daily.   dapagliflozin propanediol 10 MG Tabs tablet Commonly known as: FARXIGA Take 1 tablet by mouth daily.   Eliquis  5 MG Tabs tablet Generic drug: apixaban  Take 5 mg by mouth 2 (two) times daily.   heparin  lock flush 100 UNIT/ML Soln injection Inject 5 mL (500 Units total) as directed as needed for line care. Flush using SASH (saline, administer  IV, saline, heparin ) method as directed. Flush IV catheter with heparin  after the last saline flush.  Use syringe ONE TIME only then discard. Storage: Room Temperature.   Linzess  290 MCG Caps capsule Generic drug: linaclotide  Take 290 mcg by mouth daily.   magnesium  chloride 200 MG/ML Soln Inject 20 mLs (4,000 mg total) as directed 2 (two) times a week. 4gm MgSulfate on 1000 ml LR per home infusion   methocarbamol  750 MG tablet Commonly known as: ROBAXIN  Take 750 mg by mouth daily as needed for muscle spasms.   montelukast  10 MG tablet Commonly known as: SINGULAIR  Take 10 mg by mouth daily as needed (allergies).   pantoprazole  40 MG tablet Commonly known as: PROTONIX  Take 40 mg by mouth daily as needed.   potassium chloride  SA 20 MEQ tablet Commonly known as: KLOR-CON  M Take 20 mEq by mouth 2 (two) times daily as needed.   pregabalin  150 MG capsule Commonly known as: LYRICA  Take 150 mg by mouth 2 (two) times daily.   Premarin vaginal cream Generic drug: conjugated estrogens Place 0.5  g vaginally 2 (two) times a week.   promethazine  25 MG tablet Commonly known as: PHENERGAN  Take 12.5 mg by mouth every 6 (six) hours as needed for nausea or vomiting.   Ritalin  20 MG tablet Generic drug: methylphenidate  Take 20 mg by mouth 3 (three) times daily. Take 1 tablet (20 mg) TID at 0800, 1200 & 2100-2200   sucralfate  1 g tablet Commonly known as: CARAFATE  Take 1 g by mouth 4 (four) times daily.   traMADol  50 MG tablet Commonly known as: ULTRAM  Take 50 mg by mouth every 6 (six) hours as needed for moderate pain.       Allergies  Allergen Reactions   Duloxetine     Other reaction(s): Other (See Comments), Palpitations Other reaction(s): Hallucinations, Hyperactive behavior (finding), Other (See Comments) Stressed out     Flecainide     Other reaction(s): Other (See Comments) Other reaction(s): Hypomagnesemia (disorder), Other (See Comments) Other Reaction: QUESTION HYPOMAGNESEMIA     Fluoxetine     Other reaction(s): Nausea And Vomiting, Other (See Comments) Other reaction(s):  Other (See Comments) Other Reaction: INCREASED HR  causes irritability and irrational behavior "I go crazy"   Gabapentin     Goes crazy Other reaction(s): Other (See Comments) Other reaction(s): Hyperactive behavior (finding), Mental Status Changes (intolerance), Other (See Comments) Goes crazy Hallucinations   Baclofen     Other reaction(s): Other (See Comments), Other (See Comments) Weakness  Other reaction(s): Other (See Comments) Weakness and loose Weakness  Other reaction(s):  Weakness     Cephalosporins     Other reaction(s): Hives, Rash Received pre-op vancomycin and cefazolin 07/20/16. Developed urticarial rash on bilateral lower extremities + slight periorbital swelling. Treated with diphenhydramine . Received pre-op vancomycin and cefazolin 07/20/16. Developed urticarial rash on bilateral lower extremities + slight periorbital swelling. Treated with  diphenhydramine . Received pre-op vancomycin and cefazolin 07/20/16. Developed urticarial rash on bilateral lower extremities + slight periorbital swelling. Treated with diphenhydramine .   Sulfasalazine     Other reaction(s): Nausea And Vomiting   Sulfa Antibiotics Nausea And Vomiting   Vancomycin     Other reaction(s): Hives Received pre-op vancomycin and cefazolin 07/20/16. Developed urticarial rash on bilateral lower extremities + slight periorbital swelling. Treated with diphenhydramine . Received pre-op vancomycin and cefazolin 07/20/16. Developed urticarial rash on bilateral lower extremities + slight periorbital swelling. Treated with diphenhydramine . Received pre-op vancomycin and cefazolin 07/20/16. Developed urticarial rash on bilateral lower extremities + slight periorbital swelling. Treated with diphenhydramine .   Cefazolin Rash    Tolerated ceftriaxone  2023 and cephalexin  2022   Discharge Instructions     Call MD for:  difficulty breathing, headache or visual disturbances   Complete by: As directed    Call MD for:  extreme fatigue   Complete by: As directed    Call MD for:  persistant dizziness or light-headedness   Complete by: As directed    Call MD for:  persistant nausea and vomiting   Complete by: As directed    Call MD for:  severe uncontrolled pain   Complete by: As directed    Call MD for:  temperature >100.4   Complete by: As directed    Diet - low sodium heart healthy   Complete by: As directed    Discharge instructions   Complete by: As directed    Follow-up with PCP in 1 week, repeat CBC and BMP in 1 week to check electrolytes   Increase activity slowly   Complete by: As directed        The results of significant diagnostics from this hospitalization (including imaging, microbiology, ancillary and laboratory) are listed below for reference.    Significant Diagnostic Studies: DG Abd 2 Views Result Date: 04/15/2024 CLINICAL DATA:  Constipation EXAM: ABDOMEN - 2  VIEW COMPARISON:  04/11/2024. FINDINGS: The bowel gas pattern is normal. There is no evidence of free air. No radio-opaque calculi or other significant radiographic abnormality is seen. Electronic stimulation device right upper quadrant with wires extending to the thoracic spine. IMPRESSION: Negative. Electronically Signed   By: Sydell Eva [name]   On: 04/15/2024 09:16   DG Abd 1 View Result Date: 04/11/2024 CLINICAL DATA:  [id number].  Abdominal pain. EXAM: ABDOMEN - 1 VIEW COMPARISON:  Abdomen series 03/12/2024, CT abdomen and pelvis contrast yesterday. FINDINGS: The bowel gas pattern is normal. No radio-opaque calculi or acute radiographic abnormality are seen. Epicardial pacemaker/defibrillator leads overlie the right heart extending up from the abdomen with the power source in the right mid abdomen. There are cholecystectomy clips. Mild dextroscoliosis and degenerative change lumbar spine. Comparison to the  prior studies reveals no significant interval change. IMPRESSION: 1. No acute radiographic findings. 2. Epicardial pacemaker/defibrillator. 3. Mild dextroscoliosis and degenerative change lumbar spine. Electronically Signed   By: Denman Fischer [name]   On: 04/11/2024 02:24   CT ABDOMEN PELVIS W CONTRAST Result Date: 04/10/2024 CLINICAL DATA:  Abdominal pain, acute, nonlocalized. EXAM: CT ABDOMEN AND PELVIS WITH CONTRAST TECHNIQUE: Multidetector CT imaging of the abdomen and pelvis was performed using the standard protocol following bolus administration of intravenous contrast. RADIATION DOSE REDUCTION: This exam was performed according to the departmental dose-optimization program which includes automated exposure control, adjustment of the mA and/or kV according to patient size and/or use of iterative reconstruction technique. CONTRAST:  75mL OMNIPAQUE  IOHEXOL  300 MG/ML  SOLN COMPARISON:  CT scan chest, abdomen and pelvis from 11/10/2023. FINDINGS: Lower chest: There are dependent changes in the  visualized lung bases. No overt consolidation. No pleural effusion. The heart is normal in size. No pericardial effusion. Partially seen pacemaker leads. Hepatobiliary: The liver is normal in size. Non-cirrhotic configuration. No suspicious mass. These is mild diffuse hepatic steatosis. No intrahepatic or extrahepatic bile duct dilation. Gallbladder is surgically absent. Pancreas: Probable atrophic pancreas. Spleen: Within normal limits. No focal lesion. Adrenals/Urinary Tract: Adrenal glands are unremarkable. No suspicious renal mass. There are multiple hypoattenuating structures in bilateral kidneys, which are not well evaluated on the current exam. No nephroureterolithiasis. Mild fullness in the bilateral renal collecting systems. No hydroureter. Unremarkable urinary bladder. Stomach/Bowel: No disproportionate dilation of the small or large bowel loops. No evidence of abnormal bowel wall thickening or inflammatory changes. The appendix was not visualized; however there is no acute inflammatory process in the right lower quadrant. There is moderate stool burden. Vascular/Lymphatic: No ascites or pneumoperitoneum. No abdominal or pelvic lymphadenopathy, by size criteria. No aneurysmal dilation of the major abdominal arteries. There are scattered colonic diverticula without diverticulitis. Reproductive: The uterus is surgically absent. No large adnexal mass. Other: The visualized soft tissues and abdominal wall are unremarkable. Musculoskeletal: No suspicious osseous lesions. There are mild multilevel degenerative changes in the visualized spine. IMPRESSION: 1. No acute inflammatory process identified within the abdomen or pelvis. 2. Multiple other nonacute observations, as described above. Electronically Signed   By: Beula Brunswick [name]   On: 04/10/2024 17:43   CT Head Wo Contrast Result Date: 04/10/2024 CLINICAL DATA:  fall, hit head EXAM: CT HEAD WITHOUT CONTRAST TECHNIQUE: Contiguous axial images were  obtained from the base of the skull through the vertex without intravenous contrast. RADIATION DOSE REDUCTION: This exam was performed according to the departmental dose-optimization program which includes automated exposure control, adjustment of the mA and/or kV according to patient size and/or use of iterative reconstruction technique. COMPARISON:  CT head 11/10/2023 FINDINGS: Brain: Chronic left basal ganglia lacunar infarction. No evidence of large-territorial acute infarction. No parenchymal hemorrhage. No mass lesion. No extra-axial collection. No mass effect or midline shift. No hydrocephalus. Basilar cisterns are patent. Vascular: No hyperdense vessel. Skull: No acute fracture or focal lesion. Sinuses/Orbits: Paranasal sinuses and mastoid air cells are clear. The orbits are unremarkable. Other: None. IMPRESSION: No acute intracranial abnormality. Electronically Signed   By: Morgane  Naveau [name]   On: 04/10/2024 17:37   DG Hip Unilat With Pelvis 2-3 Views Right Result Date: 04/10/2024 CLINICAL DATA:  Right hip pain following a fall. EXAM: DG HIP (WITH OR WITHOUT PELVIS) 2-3V RIGHT COMPARISON:  None Available. FINDINGS: Normal-appearing hips with no fracture or dislocation seen. No pelvic fractures demonstrated. Mild lower lumbar spine  degenerative changes. IMPRESSION: 1. No fracture. 2. Mild lower lumbar spine degenerative changes. Electronically Signed   By: Catherin Closs [name]   On: 04/10/2024 17:10   DG Chest 2 View Result Date: 04/10/2024 CLINICAL DATA:  Chest and abdominal pain. EXAM: CHEST - 2 VIEW COMPARISON:  03/12/2024 FINDINGS: Normal sized heart. Clear lungs with normal vascularity. Stable right jugular porta catheter with its tip in the region of the superior cavoatrial junction. Stable left subclavian right atrial pacemaker lead and abdominal epicardial pacemaker leads. Stable broken inferior sternotomy wires. Mild thoracolumbar degenerative changes acute kyphosis. IMPRESSION: No acute  abnormality. Electronically Signed   By: Catherin Closs [name]   On: 04/10/2024 16:44    Microbiology: Recent Results (from the past 240 hours)  Urine Culture (for pregnant, neutropenic or urologic patients or patients with an indwelling urinary catheter)     Status: Abnormal   Collection Time: 04/13/24  2:43 PM   Specimen: Urine, Clean Catch  Result Value Ref Range Status   Specimen Description   Final    URINE, CLEAN CATCH Performed at [facility], [address], [place], [place] [address]    Special Requests   Final    NONE Performed at [facility], [address] [place], [place] [address]    Culture MULTIPLE SPECIES PRESENT, SUGGEST RECOLLECTION (A)  Final   Report Status 04/14/2024 FINAL  Final     Labs: CBC: Recent Labs  Lab 04/12/24 0551 04/13/24 1056 04/14/24 0506 04/15/24 0438 04/16/24 0416  WBC 5.3 4.1 3.5* 3.6* 3.2*  HGB 9.7* 12.1 11.6* 11.5* 12.1  HCT 29.5* 37.3 35.4* 35.0* 36.7  MCV 78.5* 78.7* 78.5* 78.0* 76.3*  PLT 102* 123* 128* 140* 138*   Basic Metabolic Panel: Recent Labs  Lab 04/12/24 0551 04/13/24 1056 04/14/24 0506 04/15/24 0438 04/16/24 0416  NA 139 138 138 139 137  K 4.3 3.9 3.7 3.8 3.4*  CL 111 101 99 101 100  CO2 25 26 27 28 25   GLUCOSE 102* 119* 107* 105* 104*  BUN 17 16 17 16 16   CREATININE 0.85 1.16* 0.88 0.75 0.79  CALCIUM  8.6* 9.6 9.3 9.3 9.1  MG 1.6* 1.7 2.4 1.5* 2.0  PHOS 4.4 3.5 4.2 4.1 3.7   Liver Function Tests: Recent Labs  Lab 04/10/24 1427  AST 25  ALT 25  ALKPHOS 83  BILITOT 1.1  PROT 6.9  ALBUMIN 4.1   Recent Labs  Lab 04/10/24 1427  LIPASE 26   No results for input(s): "AMMONIA" in the last 168 hours. Cardiac Enzymes: No results for input(s): "CKTOTAL", "CKMB", "CKMBINDEX", "TROPONINI" in the last 168 hours. BNP (last 3 results) No results for input(s): "BNP" in the last 8760 hours. CBG: No results for input(s): "GLUCAP" in the last 168 hours.  Time spent: 35  minutes  Signed:  Althia Atlas  [facility] 04/16/2024 11:37 AM

## 2024-04-24 DIAGNOSIS — Z7689 Persons encountering health services in other specified circumstances: Secondary | ICD-10-CM | POA: Diagnosis not present

## 2024-04-25 ENCOUNTER — Other Ambulatory Visit: Payer: Self-pay

## 2024-04-27 DIAGNOSIS — Z7689 Persons encountering health services in other specified circumstances: Secondary | ICD-10-CM | POA: Diagnosis not present

## 2024-05-07 ENCOUNTER — Observation Stay: Payer: Medicare (Managed Care)

## 2024-05-07 ENCOUNTER — Emergency Department: Payer: Medicare (Managed Care)

## 2024-05-07 ENCOUNTER — Observation Stay
Admission: EM | Admit: 2024-05-07 | Discharge: 2024-05-09 | Disposition: A | Discharge status: Home / Self Care | Payer: Medicare (Managed Care) | Attending: Osteopathic Medicine | Admitting: Osteopathic Medicine

## 2024-05-07 ENCOUNTER — Encounter: Payer: Self-pay | Admitting: Internal Medicine

## 2024-05-07 ENCOUNTER — Other Ambulatory Visit: Payer: Self-pay

## 2024-05-07 DIAGNOSIS — W19XXXA Unspecified fall, initial encounter: Secondary | ICD-10-CM | POA: Diagnosis not present

## 2024-05-07 DIAGNOSIS — E878 Other disorders of electrolyte and fluid balance, not elsewhere classified: Secondary | ICD-10-CM | POA: Diagnosis not present

## 2024-05-07 DIAGNOSIS — I10 Essential (primary) hypertension: Secondary | ICD-10-CM | POA: Diagnosis present

## 2024-05-07 DIAGNOSIS — Z681 Body mass index (BMI) 19 or less, adult: Secondary | ICD-10-CM | POA: Insufficient documentation

## 2024-05-07 DIAGNOSIS — I495 Sick sinus syndrome: Secondary | ICD-10-CM | POA: Diagnosis not present

## 2024-05-07 DIAGNOSIS — G8929 Other chronic pain: Secondary | ICD-10-CM | POA: Insufficient documentation

## 2024-05-07 DIAGNOSIS — K589 Irritable bowel syndrome without diarrhea: Secondary | ICD-10-CM | POA: Diagnosis present

## 2024-05-07 DIAGNOSIS — E876 Hypokalemia: Principal | ICD-10-CM | POA: Diagnosis present

## 2024-05-07 DIAGNOSIS — R9431 Abnormal electrocardiogram [ECG] [EKG]: Secondary | ICD-10-CM | POA: Diagnosis not present

## 2024-05-07 DIAGNOSIS — E440 Moderate protein-calorie malnutrition: Secondary | ICD-10-CM | POA: Diagnosis present

## 2024-05-07 DIAGNOSIS — Z7901 Long term (current) use of anticoagulants: Secondary | ICD-10-CM | POA: Insufficient documentation

## 2024-05-07 DIAGNOSIS — Z86718 Personal history of other venous thrombosis and embolism: Secondary | ICD-10-CM | POA: Diagnosis not present

## 2024-05-07 DIAGNOSIS — Z950 Presence of cardiac pacemaker: Secondary | ICD-10-CM | POA: Insufficient documentation

## 2024-05-07 DIAGNOSIS — M545 Low back pain, unspecified: Secondary | ICD-10-CM | POA: Diagnosis not present

## 2024-05-07 DIAGNOSIS — G809 Cerebral palsy, unspecified: Secondary | ICD-10-CM | POA: Diagnosis present

## 2024-05-07 DIAGNOSIS — R531 Weakness: Secondary | ICD-10-CM | POA: Diagnosis present

## 2024-05-07 DIAGNOSIS — I82409 Acute embolism and thrombosis of unspecified deep veins of unspecified lower extremity: Secondary | ICD-10-CM | POA: Diagnosis present

## 2024-05-07 DIAGNOSIS — Y92009 Unspecified place in unspecified non-institutional (private) residence as the place of occurrence of the external cause: Secondary | ICD-10-CM

## 2024-05-07 DIAGNOSIS — K219 Gastro-esophageal reflux disease without esophagitis: Secondary | ICD-10-CM | POA: Diagnosis not present

## 2024-05-07 DIAGNOSIS — R110 Nausea: Secondary | ICD-10-CM | POA: Diagnosis not present

## 2024-05-07 LAB — CBC WITH DIFFERENTIAL/PLATELET
Abs Immature Granulocytes: 0.01 10*3/uL (ref 0.00–0.07)
Basophils Absolute: 0 10*3/uL (ref 0.0–0.1)
Basophils Relative: 1 %
Eosinophils Absolute: 0 10*3/uL (ref 0.0–0.5)
Eosinophils Relative: 1 %
HCT: 32.3 % — ABNORMAL LOW (ref 36.0–46.0)
Hemoglobin: 10.6 g/dL — ABNORMAL LOW (ref 12.0–15.0)
Immature Granulocytes: 0 %
Lymphocytes Relative: 25 %
Lymphs Abs: 1 10*3/uL (ref 0.7–4.0)
MCH: 25.9 pg — ABNORMAL LOW (ref 26.0–34.0)
MCHC: 32.8 g/dL (ref 30.0–36.0)
MCV: 78.8 fL — ABNORMAL LOW (ref 80.0–100.0)
Monocytes Absolute: 0.3 10*3/uL (ref 0.1–1.0)
Monocytes Relative: 9 %
Neutro Abs: 2.6 10*3/uL (ref 1.7–7.7)
Neutrophils Relative %: 64 %
Platelets: 111 10*3/uL — ABNORMAL LOW (ref 150–400)
RBC: 4.1 MIL/uL (ref 3.87–5.11)
RDW: 15.4 % (ref 11.5–15.5)
WBC: 3.9 10*3/uL — ABNORMAL LOW (ref 4.0–10.5)
nRBC: 0 % (ref 0.0–0.2)

## 2024-05-07 LAB — COMPREHENSIVE METABOLIC PANEL WITH GFR
ALT: 25 U/L (ref 0–44)
AST: 22 U/L (ref 15–41)
Albumin: 3.8 g/dL (ref 3.5–5.0)
Alkaline Phosphatase: 73 U/L (ref 38–126)
Anion gap: 11 (ref 5–15)
BUN: 16 mg/dL (ref 6–20)
CO2: 27 mmol/L (ref 22–32)
Calcium: 9.2 mg/dL (ref 8.9–10.3)
Chloride: 105 mmol/L (ref 98–111)
Creatinine, Ser: 0.68 mg/dL (ref 0.44–1.00)
GFR, Estimated: >60 mL/min (ref >60)
Glucose, Bld: 104 mg/dL — ABNORMAL HIGH (ref 70–99)
Potassium: 2.8 mmol/L — ABNORMAL LOW (ref 3.5–5.1)
Sodium: 143 mmol/L (ref 135–145)
Total Bilirubin: 1 mg/dL (ref 0.0–1.2)
Total Protein: 6.3 g/dL — ABNORMAL LOW (ref 6.5–8.1)

## 2024-05-07 LAB — URINALYSIS, W/ REFLEX TO CULTURE (INFECTION SUSPECTED)
Bacteria, UA: NONE SEEN
Bilirubin Urine: NEGATIVE
Glucose, UA: NEGATIVE mg/dL
Ketones, ur: 5 mg/dL — AB
Leukocytes,Ua: NEGATIVE
Nitrite: NEGATIVE
Protein, ur: NEGATIVE mg/dL
Specific Gravity, Urine: 1.017 (ref 1.005–1.030)
pH: 6 (ref 5.0–8.0)

## 2024-05-07 LAB — PHOSPHORUS: Phosphorus: 3.7 mg/dL (ref 2.5–4.6)

## 2024-05-07 LAB — TROPONIN I (HIGH SENSITIVITY)
Troponin I (High Sensitivity): 3 ng/L (ref <18)
Troponin I (High Sensitivity): 5 ng/L (ref <18)

## 2024-05-07 LAB — MAGNESIUM: Magnesium: 1 mg/dL — ABNORMAL LOW (ref 1.7–2.4)

## 2024-05-07 MED ORDER — ONDANSETRON HCL 4 MG/2ML IJ SOLN: 4.0000 mg | Freq: Four times a day (QID) | INTRAMUSCULAR | Status: DC | PRN

## 2024-05-07 MED ORDER — SODIUM CHLORIDE 0.9% FLUSH: 10.0000 mL | INTRAVENOUS | Status: DC | PRN

## 2024-05-07 MED ORDER — ACETAMINOPHEN 650 MG RE SUPP: 650.0000 mg | Freq: Four times a day (QID) | RECTAL | Status: DC | PRN

## 2024-05-07 MED ORDER — HYDRALAZINE HCL 20 MG/ML IJ SOLN: 5.0000 mg | Freq: Four times a day (QID) | INTRAMUSCULAR | Status: DC | PRN

## 2024-05-07 MED ORDER — BISACODYL 10 MG RE SUPP: 10.0000 mg | Freq: Every day | RECTAL | Status: DC | PRN

## 2024-05-07 MED ORDER — MONTELUKAST SODIUM 10 MG PO TABS: 10.0000 mg | ORAL_TABLET | Freq: Every day | ORAL | Status: DC | PRN

## 2024-05-07 MED ORDER — TRAMADOL HCL 50 MG PO TABS: 50.0000 mg | ORAL_TABLET | Freq: Four times a day (QID) | ORAL | Status: DC | PRN

## 2024-05-07 MED ORDER — APIXABAN 5 MG PO TABS
5.0000 mg | ORAL_TABLET | Freq: Two times a day (BID) | ORAL | Status: DC
  Administered 2024-05-07 – 2024-05-09 (×4): 5 mg via ORAL
  Filled 2024-05-07 (×4): qty 1

## 2024-05-07 MED ORDER — SENNOSIDES-DOCUSATE SODIUM 8.6-50 MG PO TABS: 1.0000 | ORAL_TABLET | Freq: Every evening | ORAL | Status: DC | PRN

## 2024-05-07 MED ORDER — MAGNESIUM SULFATE 2 GM/50ML IV SOLN
2.0000 g | Freq: Once | INTRAVENOUS | Status: AC
  Administered 2024-05-07: 2 g via INTRAVENOUS
  Filled 2024-05-07: qty 50

## 2024-05-07 MED ORDER — SUCRALFATE 1 G PO TABS
1.0000 g | ORAL_TABLET | Freq: Four times a day (QID) | ORAL | Status: DC
  Administered 2024-05-07 – 2024-05-08 (×5): 1 g via ORAL
  Filled 2024-05-07 (×6): qty 1

## 2024-05-07 MED ORDER — VITAMIN D 25 MCG (1000 UNIT) PO TABS
2000.0000 [IU] | ORAL_TABLET | Freq: Every day | ORAL | Status: DC
  Filled 2024-05-07: qty 2

## 2024-05-07 MED ORDER — METHYLPHENIDATE HCL 10 MG PO TABS
20.0000 mg | ORAL_TABLET | Freq: Three times a day (TID) | ORAL | Status: DC
  Administered 2024-05-08 – 2024-05-09 (×4): 20 mg via ORAL
  Filled 2024-05-07 (×6): qty 2

## 2024-05-07 MED ORDER — METHOCARBAMOL 500 MG PO TABS: 750.0000 mg | ORAL_TABLET | Freq: Every day | ORAL | Status: DC | PRN

## 2024-05-07 MED ORDER — PREGABALIN 75 MG PO CAPS
150.0000 mg | ORAL_CAPSULE | Freq: Two times a day (BID) | ORAL | Status: DC
  Administered 2024-05-08 – 2024-05-09 (×2): 150 mg via ORAL
  Filled 2024-05-07 (×3): qty 2

## 2024-05-07 MED ORDER — ONDANSETRON HCL 4 MG PO TABS: 4.0000 mg | ORAL_TABLET | Freq: Four times a day (QID) | ORAL | Status: DC | PRN

## 2024-05-07 MED ORDER — BISACODYL 5 MG PO TBEC
5.0000 mg | DELAYED_RELEASE_TABLET | Freq: Every day | ORAL | Status: DC
  Filled 2024-05-07: qty 1

## 2024-05-07 MED ORDER — DAPAGLIFLOZIN PROPANEDIOL 10 MG PO TABS
10.0000 mg | ORAL_TABLET | Freq: Every day | ORAL | Status: DC
  Filled 2024-05-07: qty 1

## 2024-05-07 MED ORDER — ACETAMINOPHEN 325 MG PO TABS: 650.0000 mg | ORAL_TABLET | Freq: Four times a day (QID) | ORAL | Status: DC | PRN

## 2024-05-07 MED ORDER — POTASSIUM CHLORIDE CRYS ER 20 MEQ PO TBCR
40.0000 meq | EXTENDED_RELEASE_TABLET | Freq: Once | ORAL | Status: AC
  Administered 2024-05-07: 40 meq via ORAL
  Filled 2024-05-07: qty 2

## 2024-05-07 MED ORDER — HEPARIN SODIUM (PORCINE) 5000 UNIT/ML IJ SOLN: 5000.0000 [IU] | Freq: Three times a day (TID) | INTRAMUSCULAR | Status: DC

## 2024-05-07 MED ORDER — CYANOCOBALAMIN 500 MCG PO TABS
1000.0000 ug | ORAL_TABLET | Freq: Every day | ORAL | Status: DC
  Administered 2024-05-08: 1000 ug via ORAL
  Filled 2024-05-07: qty 2

## 2024-05-07 MED ORDER — PANTOPRAZOLE SODIUM 40 MG PO TBEC
40.0000 mg | DELAYED_RELEASE_TABLET | Freq: Every day | ORAL | Status: DC
  Administered 2024-05-08: 40 mg via ORAL
  Filled 2024-05-07: qty 1

## 2024-05-07 MED ORDER — POTASSIUM CITRATE-CITRIC ACID 1100-334 MG/5ML PO SOLN
20.0000 meq | Freq: Once | ORAL | Status: DC
  Filled 2024-05-07: qty 10

## 2024-05-07 MED ORDER — CHLORHEXIDINE GLUCONATE CLOTH 2 % EX PADS
6.0000 | MEDICATED_PAD | Freq: Every day | CUTANEOUS | Status: DC
  Administered 2024-05-08: 6 via TOPICAL
  Filled 2024-05-07: qty 6

## 2024-05-07 MED ORDER — POTASSIUM CHLORIDE CRYS ER 20 MEQ PO TBCR
40.0000 meq | EXTENDED_RELEASE_TABLET | Freq: Once | ORAL | Status: AC
Start: 2024-05-07 — End: 2024-05-07
  Administered 2024-05-07: 40 meq via ORAL
  Filled 2024-05-07: qty 2

## 2024-05-07 NOTE — ED Notes (Signed)
 Patient assisted to use bedpan.  No complaints voiced at this time

## 2024-05-07 NOTE — Assessment & Plan Note (Signed)
 Registered dietitian consulted on admission

## 2024-05-07 NOTE — Assessment & Plan Note (Signed)
 Status post magnesium  sulfate 2 g IV, 2 doses ordered by EDP Recheck magnesium  in a.m.

## 2024-05-07 NOTE — Hospital Course (Signed)
 [name] is a 43 year old female with history of cerebral palsy, hypertension, GERD, POTS, SA node dysfunction status post pacemaker placement, history of DVT on Eliquis , who presents to the emergency department for chief concerns of multiple falls over the last 2 days and patient being concern for low magnesium  level.  Vitals in the ED showed T of 98.2, rr of 16, hr 87, blood pressure 135/77, SpO2 of 100% on room air.  Serum sodium is 142, potassium 2.8, chloride 105, bicarb 27, BUN 16, serum creatinine 0.68, EGFR greater than 60, nonfasting blood glucose 104, WBC 3.9, hemoglobin 10.6, platelets of 111.  UA was negative for leukocytes and nitrates.  ED treatment: Potassium chloride  40 mill colons p.o. one-time dose, magnesium  2 g IV one-time dose.

## 2024-05-07 NOTE — ED Provider Notes (Signed)
 [facility] Provider Note    Event Date/Time   First MD Initiated Contact with Patient 05/07/24 1645     (approximate)   History   Fall   HPI  [name] is a 43 y.o. female with history of cerebral palsy, chronic back pain, SA node dysfunction status post pacemaker, history of POTS, presenting for fall.  Patient states that she fell twice in the last 2 days.  States that her legs give out.  Has been feeling weak.  Thinks that she has a UTI.  Is also concerned that her magnesium  is low.  States that she had some chest pain yesterday but none right now.  Denies any cough or shortness of breath, no new focal weakness or numbness.  States that she has a swelling to the inferior portion of her left knee.  Per independent history from EMS, she had called for multiple complaints, thinks that she is not to her left knee, also was complaining about right lower back pain.  Patient states that the pain has been there prior to the fall.   On independent review, she was admitted in early May for hypokalemia and hypomagnesemia.  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  Physical Exam   Triage Vital Signs: ED Triage Vitals  Encounter Vitals Group     BP      Systolic BP Percentile      Diastolic BP Percentile      Pulse      Resp      Temp      Temp src      SpO2      Weight      Height      Head Circumference      Peak Flow      Pain Score      Pain Loc      Pain Education      Exclude from Growth Chart     Most recent vital signs: Vitals:   05/07/24 1647  BP: 135/77  Pulse: 87  Resp: 16  Temp: 98.2 F (36.8 C)     General: Awake, no distress.  CV:  Good peripheral perfusion.  Resp:  Normal effort.  No thoracic cage tenderness Abd:  No distention.  Soft nontender Other:  No palpable skull deformities or tenderness, no midline spinal tenderness, chest mild  tenderness to the right paralumbar region, able to range her extremities without bony tenderness, she does have a palpable swelling inferior to her right knee has soft, there are no overlying skin changes, no ecchymosis, erythema, drainage.  She does have an abrasion to her left lower periorbital region without bony tenderness to her face.   ED Results / Procedures / Treatments   Labs (all labs ordered are listed, but only abnormal results are displayed) Labs Reviewed  COMPREHENSIVE METABOLIC PANEL WITH GFR - Abnormal; Notable for the following components:      Result Value   Potassium 2.8 (*)    Glucose, Bld 104 (*)    Total Protein 6.3 (*)    All other components within normal limits  CBC WITH DIFFERENTIAL/PLATELET - Abnormal; Notable for the following components:   WBC 3.9 (*)    Hemoglobin 10.6 (*)    HCT 32.3 (*)    MCV 78.8 (*)    MCH 25.9 (*)    Platelets 111 (*)    All other  components within normal limits  URINALYSIS, W/ REFLEX TO CULTURE (INFECTION SUSPECTED) - Abnormal; Notable for the following components:   Color, Urine YELLOW (*)    APPearance CLEAR (*)    Hgb urine dipstick SMALL (*)    Ketones, ur 5 (*)    All other components within normal limits  MAGNESIUM  - Abnormal; Notable for the following components:   Magnesium  1.0 (*)    All other components within normal limits  PHOSPHORUS  BASIC METABOLIC PANEL WITH GFR  MAGNESIUM   PHOSPHORUS  TROPONIN I (HIGH SENSITIVITY)  TROPONIN I (HIGH SENSITIVITY)     EKG  EKG shows, sinus rhythm, rate of 85, normal QRS, normal QTc, no ischemic ST elevation, T wave flattening in 1, aVL, V2, not significant compared to prior   RADIOLOGY On my independent interpretation, CT head is negative for obvious intracranial hemorrhage   PROCEDURES:  Critical Care performed: Yes, see critical care procedure note(s)  .Critical Care  Performed by: Shane Darling, MD Authorized by: Shane Darling, MD   Critical care provider  statement:    Critical care time (minutes):  40   Critical care was necessary to treat or prevent imminent or life-threatening deterioration of the following conditions:  Metabolic crisis   Critical care was time spent personally by me on the following activities:  Development of treatment plan with patient or surrogate, discussions with consultants, evaluation of patient's response to treatment, examination of patient, ordering and review of laboratory studies, ordering and review of radiographic studies, ordering and performing treatments and interventions, pulse oximetry, re-evaluation of patient's condition and review of old charts    MEDICATIONS ORDERED IN ED: Medications  sodium chloride  flush (NS) 0.9 % injection 10-40 mL (has no administration in time range)  Chlorhexidine  Gluconate Cloth 2 % PADS 6 each (6 each Topical Not Given 05/07/24 1848)  magnesium  sulfate IVPB 2 g 50 mL (has no administration in time range)  potassium chloride  SA (KLOR-CON  M) CR tablet 40 mEq (has no administration in time range)  magnesium  sulfate IVPB 2 g 50 mL (has no administration in time range)  potassium chloride  SA (KLOR-CON  M) CR tablet 40 mEq (has no administration in time range)     IMPRESSION / MDM / ASSESSMENT AND PLAN / ED COURSE  I reviewed the triage vital signs and the nursing notes.                              Differential diagnosis includes, but is not limited to, electrolyte derangements, dehydration, ACS, UTI, for the swelling to the knee, lipoma, consider hematoma but there is no overlying ecchymoses or erythema, it is also not tender to palpation.  Also consider intracranial hemorrhage, fracture.  Will get labs, EKG, troponin, chest x-ray, UA, magnesium , Phos, CT head, cervical spine, x-ray of the knee.  Patient's presentation is most consistent with acute presentation with potential threat to life or bodily function.  Independent interpretation of labs and imaging below.  Given her  hypomagnesemia and hypokalemia, she will need to be admitted for further management and repletion.  Consult the hospitalist was agreeable with plan for admission and will evaluate the patient.  She is admitted.  The patient is on the cardiac monitor to evaluate for evidence of arrhythmia and/or significant heart rate changes.   Clinical Course as of 05/07/24 1924  Mon May 07, 2024  1728 CT Head Wo Contrast IMPRESSION: 1. No acute intracranial process.  2. Right frontal sinus disease.   [TT]  1728 CT Cervical Spine Wo Contrast IMPRESSION: 1. No acute cervical spine fracture. 2. 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 3. Stable multilevel cervical degenerative changes   [TT]  1747 DG Chest 1 View 1. Stable chest, no acute process.  [TT]  1747 DG Knee Complete 4 Views Left IMPRESSION: 1. Soft tissue swelling anterior to the tibial tuberosity, and extending into the infrapatellar and prepatellar regions, unchanged since prior exam. 2. No acute bony abnormality. 3. Stable osteoarthritis.   [TT]  1905 Independent review of labs, she is hypokalemic and hypomagnesemic, phosphorus is normal, UA is not consistent with UTI, creatinine is normal, LFTs are normal.  Her platelets and white counts have been chronically low. [TT]    Clinical Course User Index [TT] Drenda Gentle Richard Champion, MD     FINAL CLINICAL IMPRESSION(S) / ED DIAGNOSES   Final diagnoses:  Fall, initial encounter  Hypomagnesemia  Hypokalemia  Acute right-sided low back pain without sciatica     Rx / DC Orders   ED Discharge Orders     None        Note:  This document was prepared using Dragon voice recognition software and may include unintentional dictation errors.Shane Darling, MD 05/07/24 [phone]

## 2024-05-07 NOTE — Assessment & Plan Note (Addendum)
 Potassium chloride  40 mEq p.o. one-time dose per EDP Polycitra 20 mill equivalent p.o. one-time dose ordered on admission Recheck BMP in a.m.

## 2024-05-07 NOTE — Assessment & Plan Note (Addendum)
 Hydralazine  5 mg IV every 6 hours prn for SBP > 165, 5 days ordered

## 2024-05-07 NOTE — H&P (Signed)
 History and Physical   [name] BJY:[id number] DOB: Sep 26, 1981 DOA: 05/07/2024  PCP: Caitlyn Leventhal, MD  Outpatient Specialists: Dr. Lorretta [name]; [facility] Patient coming from: Home via EMS  I have personally briefly reviewed patient's old medical records in [facility] EMR.  Chief Concern: Fall, concerns for low magnesium   HPI: [name] is a 43 year old female with history of cerebral palsy, hypertension, GERD, POTS, SA node dysfunction status post pacemaker placement, history of DVT on Eliquis , who presents to the emergency department for chief concerns of multiple falls over the last 2 days and patient being concern for low magnesium  level.  Vitals in the ED showed T of 98.2, rr of 16, hr 87, blood pressure 135/77, SpO2 of 100% on room air.  Serum sodium is 142, potassium 2.8, chloride 105, bicarb 27, BUN 16, serum creatinine 0.68, EGFR greater than 60, nonfasting blood glucose 104, WBC 3.9, hemoglobin 10.6, platelets of 111.  UA was negative for leukocytes and nitrates.  ED treatment: Potassium chloride  40 mill colons p.o. one-time dose, magnesium  2 g IV one-time dose. ------------------------------------------- At bedside, patient was able to tell me her name and her age and she knows she is in the hospital.  Her speech is very difficult to understand.  Though she was able to communicate to me that she wanted the lights turned off when I exited the room.  She states that her back pain feels different than her baseline back pain.  She states she fell 2 times over the last 2 days and she was concerned that her magnesium  was low.  Social history: She lives at home with her mother.  She denies tobacco, EtOH, recreational drug use.  She is disabled.  ROS: Constitutional: no weight change, no fever ENT/Mouth: no sore throat, no rhinorrhea Eyes: no eye pain, no vision changes Cardiovascular: no chest pain, no dyspnea,  no edema, no  palpitations Respiratory: no cough, no sputum, no wheezing Gastrointestinal: no nausea, no vomiting, no diarrhea, no constipation Genitourinary: no urinary incontinence, no dysuria, no hematuria Musculoskeletal: no arthralgias, no myalgias Skin: no skin lesions, no pruritus, Neuro: + weakness, no loss of consciousness, no syncope Psych: no anxiety, no depression, no decrease appetite Heme/Lymph: no bruising, no bleeding  ED Course: Discussed with EDP, patient requiring hospitalization for chief concerns of hypokalemia and hypomagnesemia.  Assessment/Plan  Principal Problem:   Hypokalemia Active Problems:   Hypomagnesemia   Electrolyte abnormality   Essential hypertension   IBS (irritable bowel syndrome)   GERD without esophagitis   Protein-calorie malnutrition, moderate (HCC)   DVT (deep venous thrombosis) (HCC)   Cerebral palsy, unspecified (HCC)   Fall at home, initial encounter   SA node dysfunction (HCC)   Low back pain   Assessment and Plan:  * Hypokalemia Potassium chloride  40 mEq p.o. one-time dose per EDP Polycitra 20 mill equivalent p.o. one-time dose ordered on admission Recheck BMP in a.m.  Hypomagnesemia Status post magnesium  sulfate 2 g IV, 2 doses ordered by EDP Recheck magnesium  in a.m.  DVT (deep venous thrombosis) (HCC) Home Eliquis  5 mg BID resumed on admission  Protein-calorie malnutrition, moderate (HCC) Registered dietitian consulted on admission  GERD without esophagitis Home PPI Home dosing resumed on admission  Essential hypertension Hydralazine  5 mg IV every 6 hours prn for SBP > 165, 5 days ordered  Low back pain New from baseline Port lumbar spine x ray ordered on admission  SA node dysfunction (HCC) Status post pacemaker placement  Chart  reviewed.   DVT prophylaxis: Eliquis  Code Status: full code  Diet: Heart healthy diet Family Communication: discussed with mother, [name]  Disposition Plan: Pending clinical  course Consults called: none at this time  Admission status: Telemetry medical, observation  Past Medical History:  Diagnosis Date   Carpal tunnel syndrome    Cerebral palsy (HCC)    Chronic back pain    Gitelman syndrome    Hypokalemia    now resolved   Magnesium  deficiency    Pacemaker    PONV (postoperative nausea and vomiting)    Postural orthostatic tachycardia syndrome    s/p pacemaker placement.   POTS (postural orthostatic tachycardia syndrome)    RSD (reflex sympathetic dystrophy)    right foot,    RSD (reflex sympathetic dystrophy)    SA node dysfunction (HCC) 12/13/2006   Past Surgical History:  Procedure Laterality Date   ABDOMINAL HYSTERECTOMY Left 12/14/2011   Dr. Raquel [name]   CHOLECYSTECTOMY     COLONOSCOPY WITH PROPOFOL  N/A 12/11/2022   Procedure: COLONOSCOPY WITH PROPOFOL ;  Surgeon: Caitlyn Daily, MD;  Location: [facility] ENDOSCOPY;  Service: Gastroenterology;  Laterality: N/A;   COLONOSCOPY WITH PROPOFOL  N/A 12/13/2022   Procedure: COLONOSCOPY WITH PROPOFOL ;  Surgeon: Caitlyn Daily, MD;  Location: [facility] ENDOSCOPY;  Service: Gastroenterology;  Laterality: N/A;   INSERT / REPLACE / REMOVE PACEMAKER     ORTHOPEDIC SURGERY     PACEMAKER INSERTION     PORTACATH PLACEMENT     Social History:  reports that she has never smoked. She has never used smokeless tobacco. She reports that she does not drink alcohol and does not use drugs.  Allergies  Allergen Reactions   Duloxetine     Other reaction(s): Other (See Comments), Palpitations Other reaction(s): Hallucinations, Hyperactive behavior (finding), Other (See Comments) Stressed out     Flecainide     Other reaction(s): Other (See Comments) Other reaction(s): Hypomagnesemia (disorder), Other (See Comments) Other Reaction: QUESTION HYPOMAGNESEMIA     Fluoxetine     Other reaction(s): Nausea And Vomiting, Other (See Comments) Other reaction(s): Other (See Comments) Other Reaction: INCREASED HR  causes  irritability and irrational behavior "I go crazy"   Gabapentin     Goes crazy Other reaction(s): Other (See Comments) Other reaction(s): Hyperactive behavior (finding), Mental Status Changes (intolerance), Other (See Comments) Goes crazy Hallucinations   Baclofen     Other reaction(s): Other (See Comments), Other (See Comments) Weakness  Other reaction(s): Other (See Comments) Weakness and loose Weakness  Other reaction(s):  Weakness     Cephalosporins     Other reaction(s): Hives, Rash Received pre-op vancomycin and cefazolin 07/20/16. Developed urticarial rash on bilateral lower extremities + slight periorbital swelling. Treated with diphenhydramine . Received pre-op vancomycin and cefazolin 07/20/16. Developed urticarial rash on bilateral lower extremities + slight periorbital swelling. Treated with diphenhydramine . Received pre-op vancomycin and cefazolin 07/20/16. Developed urticarial rash on bilateral lower extremities + slight periorbital swelling. Treated with diphenhydramine .   Sulfasalazine     Other reaction(s): Nausea And Vomiting   Sulfa Antibiotics Nausea And Vomiting   Vancomycin     Other reaction(s): Hives Received pre-op vancomycin and cefazolin 07/20/16. Developed urticarial rash on bilateral lower extremities + slight periorbital swelling. Treated with diphenhydramine . Received pre-op vancomycin and cefazolin 07/20/16. Developed urticarial rash on bilateral lower extremities + slight periorbital swelling. Treated with diphenhydramine . Received pre-op vancomycin and cefazolin 07/20/16. Developed urticarial rash on bilateral lower extremities + slight periorbital swelling. Treated with diphenhydramine .   Cefazolin Rash  Tolerated ceftriaxone  2023 and cephalexin  2022   Family History  Problem Relation Age of Onset   Diabetes Mother    Hypertension Mother    Family history: Family history reviewed and not pertinent.  Prior to Admission medications   Medication Sig Start  Date End Date Taking? Authorizing Provider  acetaminophen  (TYLENOL ) 325 MG tablet Take 2 tablets (650 mg total) by mouth every 6 (six) hours as needed for mild pain (pain score 1-3) or fever. 09/28/23   Althia Atlas, MD  bisacodyl  (DULCOLAX) 10 MG suppository Place 1 suppository (10 mg total) rectally [name] as needed for severe constipation. 04/16/24   Althia Atlas, MD  bisacodyl  (DULCOLAX) 5 MG EC tablet Take 1-2 tablets (5-10 mg total) by mouth at bedtime. 04/16/24 04/16/25  Althia Atlas, MD  Cholecalciferol  (D2000 ULTRA STRENGTH) 50 MCG (2000 UT) CAPS Take 1 tablet by mouth [name].    [provider]  cyanocobalamin  (VITAMIN B12) 1000 MCG tablet Take 1,000 mcg by mouth [name].    [provider]  dapagliflozin propanediol (FARXIGA) 10 MG TABS tablet Take 1 tablet by mouth [name]. 12/30/23 12/29/24  [provider]  ELIQUIS  5 MG TABS tablet Take 5 mg by mouth 2 (two) times [name]. 08/12/22   [provider]  heparin  lock flush 100 UNIT/ML SOLN injection Inject 5 mL (500 Units total) as directed as needed for line care. Flush using SASH (saline, administer  IV, saline, heparin ) method as directed. Flush IV catheter with heparin  after the last saline flush.  Use syringe ONE TIME only then discard. Storage: Room Temperature. 08/30/23 08/30/24  [provider]  LINZESS  290 MCG CAPS capsule Take 290 mcg by mouth [name]. 10/26/22   [provider]  magnesium  chloride 200 MG/ML SOLN Inject 20 mLs (4,000 mg total) as directed 2 (two) times a week. 4gm MgSulfate on 1000 ml LR per home infusion Patient not taking: Reported on 04/12/2024 09/29/23 09/28/24  Althia Atlas, MD  methocarbamol  (ROBAXIN ) 750 MG tablet Take 750 mg by mouth [name] as needed for muscle spasms. 02/14/24   [provider]  methylphenidate  (RITALIN ) 20 MG tablet Take 20 mg by mouth 3 (three) times [name]. Take 1 tablet (20 mg) TID at 0800, 1200 & 2100-2200    [provider]   montelukast  (SINGULAIR ) 10 MG tablet Take 10 mg by mouth [name] as needed (allergies). 10/26/22   [provider]  pantoprazole  (PROTONIX ) 40 MG tablet Take 40 mg by mouth [name] as needed. 01/26/24   [provider]  potassium chloride  SA (KLOR-CON  M) 20 MEQ tablet Take 20 mEq by mouth 2 (two) times [name] as needed.    [provider]  pregabalin  (LYRICA ) 150 MG capsule Take 150 mg by mouth 2 (two) times [name].    [provider]  PREMARIN vaginal cream Place 0.5 g vaginally 2 (two) times a week. 11/08/22   [provider]  promethazine  (PHENERGAN ) 25 MG tablet Take 12.5 mg by mouth every 6 (six) hours as needed for nausea or vomiting. 04/05/24   [provider]  sucralfate  (CARAFATE ) 1 g tablet Take 1 g by mouth 4 (four) times [name].    [provider]  traMADol  (ULTRAM ) 50 MG tablet Take 50 mg by mouth every 6 (six) hours as needed for moderate pain. 09/20/13   [provider]   Physical Exam: Vitals:   05/07/24 1647 05/07/24 1945 05/07/24 2030 05/07/24 2040  BP: 135/77 (!) 105/92 103/62 103/62  Pulse: 87 75 74 86  Resp: 16 (!) 21 20 (!) 22  Temp: 98.2 F (36.8 C)     TempSrc: Axillary     SpO2:  99%  100%  Weight: 44.9 kg     Height: 5\' 2"  (1.575 m)      Constitutional: appears older than chronological age, calm Eyes: PERRL, lids and conjunctivae normal ENMT: Mucous membranes are moist. Posterior pharynx clear of any exudate or lesions. Age-appropriate dentition. Hearing appropriate Neck: normal, supple, no masses, no thyromegaly Respiratory: clear to auscultation bilaterally, no wheezing, no crackles. Normal respiratory effort. No accessory muscle use.  Cardiovascular: Regular rate and rhythm, no murmurs / rubs / gallops. No extremity edema. 2+ pedal pulses. No carotid bruits.  Abdomen: no tenderness, no masses palpated, no hepatosplenomegaly. Bowel sounds positive.  Musculoskeletal: no clubbing / cyanosis. No  joint deformity upper and lower extremities. Good ROM, no contractures, extremity atrophy. Normal muscle tone.  Skin: no rashes, lesions, ulcers. No induration Neurologic: Sensation intact. Strength 4/5 in all 4.  Psychiatric: Lacks judgment and insight. Alert and oriented x 3. Normal mood.   EKG: ordered and pending completion  Chest x-ray on Admission: I personally reviewed and I agree with radiologist reading as below.  DG Chest 1 View Result Date: 05/07/2024 CLINICAL DATA:  Multiple falls, chest pain EXAM: CHEST  1 VIEW COMPARISON:  04/10/2024 FINDINGS: Single frontal view of the chest demonstrates stable right chest wall port, pacemaker within the left anterior chest, and epicardial pacing leads from an inferior approach. Cardiac silhouette is stable. No acute airspace disease, effusion, or pneumothorax. No acute displaced fractures. IMPRESSION: 1. Stable chest, no acute process. Electronically Signed   By: Bobbye Burrow [name]   On: 05/07/2024 17:28   DG Knee Complete 4 Views Left Result Date: 05/07/2024 CLINICAL DATA:  Palpable abnormality left lower leg EXAM: LEFT KNEE - COMPLETE 4+ VIEW COMPARISON:  12/09/2023 FINDINGS: Frontal, bilateral oblique, and lateral views of the left knee are obtained. No acute fracture, subluxation, or dislocation. Stable mild 3 compartmental osteoarthritis with prominent chondrocalcinosis in the lateral compartment. No joint effusion. There is prominent soft tissue swelling anterior to the tibial tuberosity, and extending along the infrapatellar and prepatellar soft tissues. This is not appreciably changed since prior exam. No subcutaneous gas or radiopaque foreign body. IMPRESSION: 1. Soft tissue swelling anterior to the tibial tuberosity, and extending into the infrapatellar and prepatellar regions, unchanged since prior exam. 2. No acute bony abnormality. 3. Stable osteoarthritis. Electronically Signed   By: Bobbye Burrow [name]   On: 05/07/2024 17:26   CT  Cervical Spine Wo Contrast Result Date: 05/07/2024 CLINICAL DATA:  Multiple falls over last 2 days EXAM: CT CERVICAL SPINE WITHOUT CONTRAST TECHNIQUE: Multidetector CT imaging of the cervical spine was performed without intravenous contrast. Multiplanar CT image reconstructions were also generated. RADIATION DOSE REDUCTION: This exam was performed according to the departmental dose-optimization program which includes automated exposure control, adjustment of the mA and/or kV according to patient size and/or use of iterative reconstruction technique. COMPARISON:  11/10/2023 FINDINGS: Alignment: Mild right convex scoliosis with straightening of the cervical spine unchanged. Mild degenerative retrolisthesis of C5 on C6 again noted. 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 No acute cervical spine fractures. No destructive bony abnormalities. Soft tissues and spinal canal: No prevertebral fluid or swelling. No visible canal hematoma. Disc levels: Stable severe spondylosis at C5-6. More mild spondylosis throughout the remainder of the cervical spine. Mild left predominant facet hypertrophy unchanged.  Upper chest: Airway is patent. Visualized portions of the lung apices are clear. Other: Reconstructed images demonstrate no additional findings. IMPRESSION: 1. No acute cervical spine fracture. 2. 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 3. Stable multilevel cervical degenerative changes. Electronically Signed   By: Bobbye Burrow [name]   On: 05/07/2024 17:13   CT Head Wo Contrast Result Date: 05/07/2024 CLINICAL DATA:  Multiple falls over last 2 days EXAM: CT HEAD WITHOUT CONTRAST TECHNIQUE: Contiguous axial images were obtained from the base of the skull through the vertex without intravenous contrast. RADIATION DOSE REDUCTION: This exam was performed according to the departmental dose-optimization program which includes automated  exposure control, adjustment of the mA and/or kV according to patient size and/or use of iterative reconstruction technique. COMPARISON:  04/10/2024 FINDINGS: Brain: No acute infarct or hemorrhage. Stable hypodensities of the bilateral basal ganglia consistent with chronic lacunar infarct. Lateral ventricles and remaining midline structures are unremarkable. No acute extra-axial fluid collections. No mass effect. Vascular: No hyperdense vessel or unexpected calcification. Skull: Normal. Negative for fracture or focal lesion. Sinuses/Orbits: Partial opacification of the right frontal sinus. Remaining visualized paranasal sinuses are clear. Other: None. IMPRESSION: 1. No acute intracranial process. 2. Right frontal sinus disease. Electronically Signed   By: Bobbye Burrow [name]   On: 05/07/2024 17:10   Labs on Admission: I have personally reviewed following labs  CBC: Recent Labs  Lab 05/07/24 1702  WBC 3.9*  NEUTROABS 2.6  HGB 10.6*  HCT 32.3*  MCV 78.8*  PLT 111*   Basic Metabolic Panel: Recent Labs  Lab 05/07/24 1702  NA 143  K 2.8*  CL 105  CO2 27  GLUCOSE 104*  BUN 16  CREATININE 0.68  CALCIUM  9.2  MG 1.0*  PHOS 3.7   GFR: Estimated Creatinine Clearance: 65.6 mL/min (by C-G formula based on SCr of 0.68 mg/dL).  Liver Function Tests: Recent Labs  Lab 05/07/24 1702  AST 22  ALT 25  ALKPHOS 73  BILITOT 1.0  PROT 6.3*  ALBUMIN 3.8   Urine analysis:    Component Value Date/Time   COLORURINE YELLOW (A) 05/07/2024 1702   APPEARANCEUR CLEAR (A) 05/07/2024 1702   APPEARANCEUR Hazy 02/11/2014 1737   LABSPEC 1.017 05/07/2024 1702   LABSPEC 1.020 02/11/2014 1737   PHURINE 6.0 05/07/2024 1702   GLUCOSEU NEGATIVE 05/07/2024 1702   GLUCOSEU Negative 02/11/2014 1737   HGBUR SMALL (A) 05/07/2024 1702   BILIRUBINUR NEGATIVE 05/07/2024 1702   BILIRUBINUR Negative 02/11/2014 1737   KETONESUR 5 (A) 05/07/2024 1702   PROTEINUR NEGATIVE 05/07/2024 1702   UROBILINOGEN 0.2  04/18/2014 1640   NITRITE NEGATIVE 05/07/2024 1702   LEUKOCYTESUR NEGATIVE 05/07/2024 1702   LEUKOCYTESUR Negative 02/11/2014 1737   This document was prepared using Dragon Voice Recognition software and may include unintentional dictation errors.  Dr. Reinhold Carbine [facility]  If 7PM-7AM, please contact overnight-coverage provider If 7AM-7PM, please contact day attending provider [email or web address]  05/07/2024, 8:58 PM

## 2024-05-07 NOTE — ED Triage Notes (Signed)
 Pt to ED via EMS for falls over the past two days, think her magnesium  is low.  Pt complains of right lower back pain and a knot on her left lower leg.

## 2024-05-07 NOTE — Assessment & Plan Note (Signed)
 Status post pacemaker placement.

## 2024-05-07 NOTE — Assessment & Plan Note (Signed)
 New from baseline Port lumbar spine x ray ordered on admission

## 2024-05-07 NOTE — Assessment & Plan Note (Signed)
 Home PPI Home dosing resumed on admission

## 2024-05-07 NOTE — Consult Note (Signed)
 Pharmacy Consult Note - Electrolytes  Sodium (mmol/L)  Date Value  05/07/2024 143  01/19/2015 142   Potassium (mmol/L)  Date Value  05/07/2024 2.8 (L)  01/19/2015 3.4 (L)   Magnesium  (mg/dL)  Date Value  16/09/9603 1.0 (L)  01/19/2015 1.0 (L)   Calcium  (mg/dL)  Date Value  54/08/8118 9.2   Calcium , Total (mg/dL)  Date Value  14/78/2956 8.3 (L)   Albumin (g/dL)  Date Value  21/30/8657 3.8  06/14/2014 4.0   Phosphorus (mg/dL)  Date Value  84/69/6295 3.7   ASSESSMENT: 43 y.o. female with PMH including cerebral palsy who presents with falling, endorses she thinks her magnesium  is low. Pharmacy has been consulted to monitor and replace electrolytes. Patient's renal function is currently stable..  Lab Results  Component Value Date   CREATININE 0.68 05/07/2024   CREATININE 0.79 04/16/2024   CREATININE 0.75 04/15/2024    mIVF: N/A  Pertinent medications: N/A  Goal of Therapy:  Electrolytes WNL  PLAN: K 2.8 >> potassium chloride  PO x 1 ordered by provider Will order additional potassium chloride  PO x 1 Mg 1.0 >> magnesium  sulfate 2g IV x 1 ordered by provider Will order additional magnesium  sulfate 2g IV x 1 Check electrolytes with next AM labs   Thank you for allowing pharmacy to be a part of this patient's care.  Will M. Alva Jewels, PharmD Clinical Pharmacist 05/07/2024 7:13 PM

## 2024-05-07 NOTE — Assessment & Plan Note (Signed)
 Home Eliquis  5 mg BID resumed on admission

## 2024-05-08 ENCOUNTER — Observation Stay: Payer: Medicare (Managed Care)

## 2024-05-08 DIAGNOSIS — E876 Hypokalemia: Secondary | ICD-10-CM | POA: Diagnosis not present

## 2024-05-08 DIAGNOSIS — G809 Cerebral palsy, unspecified: Secondary | ICD-10-CM | POA: Diagnosis not present

## 2024-05-08 DIAGNOSIS — I82729 Chronic embolism and thrombosis of deep veins of unspecified upper extremity: Secondary | ICD-10-CM | POA: Diagnosis not present

## 2024-05-08 LAB — CBC
HCT: 30.5 % — ABNORMAL LOW (ref 36.0–46.0)
Hemoglobin: 10.1 g/dL — ABNORMAL LOW (ref 12.0–15.0)
MCH: 25.9 pg — ABNORMAL LOW (ref 26.0–34.0)
MCHC: 33.1 g/dL (ref 30.0–36.0)
MCV: 78.2 fL — ABNORMAL LOW (ref 80.0–100.0)
Platelets: 86 10*3/uL — ABNORMAL LOW (ref 150–400)
RBC: 3.9 MIL/uL (ref 3.87–5.11)
RDW: 15.8 % — ABNORMAL HIGH (ref 11.5–15.5)
WBC: 7.5 10*3/uL (ref 4.0–10.5)
nRBC: 0 % (ref 0.0–0.2)

## 2024-05-08 LAB — BASIC METABOLIC PANEL WITH GFR
Anion gap: 11 (ref 5–15)
BUN: 17 mg/dL (ref 6–20)
CO2: 24 mmol/L (ref 22–32)
Calcium: 8.7 mg/dL — ABNORMAL LOW (ref 8.9–10.3)
Chloride: 105 mmol/L (ref 98–111)
Creatinine, Ser: 0.82 mg/dL (ref 0.44–1.00)
GFR, Estimated: >60 mL/min (ref >60)
Glucose, Bld: 88 mg/dL (ref 70–99)
Potassium: 3.6 mmol/L (ref 3.5–5.1)
Sodium: 140 mmol/L (ref 135–145)

## 2024-05-08 LAB — MAGNESIUM: Magnesium: 1.9 mg/dL (ref 1.7–2.4)

## 2024-05-08 LAB — PHOSPHORUS: Phosphorus: 3.1 mg/dL (ref 2.5–4.6)

## 2024-05-08 MED ORDER — ENSURE ENLIVE PO LIQD
237.0000 mL | Freq: Two times a day (BID) | ORAL | Status: DC
  Administered 2024-05-08: 237 mL via ORAL

## 2024-05-08 MED ORDER — ADULT MULTIVITAMIN W/MINERALS CH
1.0000 | ORAL_TABLET | Freq: Every day | ORAL | Status: DC
  Administered 2024-05-08: 1 via ORAL
  Filled 2024-05-08: qty 1

## 2024-05-08 NOTE — Care Management Obs Status (Signed)
 MEDICARE OBSERVATION STATUS NOTIFICATION   Patient Details  Name: [name] MRN: [id number] Date of Birth: 08/01/1981   Medicare Observation Status Notification Given:  Yes    Teyon Odette W, CMA 05/08/2024, 11:51 AM

## 2024-05-08 NOTE — Progress Notes (Signed)
 [facility]  - [place] at [facility]   PATIENT NAME: [name]    MR#:  [id number]  DATE OF BIRTH:  11/09/1981  SUBJECTIVE:  patient laying in bed comfortably. Tells me she is having UTI. Discussed urine analysis with patient. No evidence of UTI. No fever.   VITALS:  Blood pressure 101/63, pulse 91, temperature 97.6 F (36.4 C), resp. rate 18, height 5\' 2"  (1.575 m), weight 42.5 kg, last menstrual period 06/09/2012, SpO2 100%.  PHYSICAL EXAMINATION:   GENERAL:  43 y.o.-year-old patient with no acute distress.  LUNGS: Normal breath sounds bilaterally, no wheezing CARDIOVASCULAR: S1, S2 normal  ABDOMEN: Soft, nontender, nondistended.  EXTREMITIES: chronic contractors NEUROLOGIC: nonfocal  patient is alert and awake  LABORATORY PANEL:  CBC Recent Labs  Lab 05/08/24 0851  WBC 7.5  HGB 10.1*  HCT 30.5*  PLT 86*    Chemistries  Recent Labs  Lab 05/07/24 1702 05/08/24 0851  NA 143 140  K 2.8* 3.6  CL 105 105  CO2 27 24  GLUCOSE 104* 88  BUN 16 17  CREATININE 0.68 0.82  CALCIUM  9.2 8.7*  MG 1.0* 1.9  AST 22  --   ALT 25  --   ALKPHOS 73  --   BILITOT 1.0  --    Cardiac Enzymes No results for input(s): "TROPONINI" in the last 168 hours. RADIOLOGY:  DG Spine Portable 1 View Result Date: 05/07/2024 CLINICAL DATA:  Low back pain after a fall EXAM: PORTABLE SPINE - 1 VIEW COMPARISON:  CT abdomen pelvis 04/10/2024 FINDINGS: Question height loss in the L5 vertebral body. Lateral view of the lumbar spine would be help for further evaluation. IMPRESSION: 1. Question height loss in the L5 vertebral body. Lateral view of the lumbar spine would be help for further evaluation. Electronically Signed   By: Rozell Cornet [name]   On: 05/07/2024 21:38   DG Chest 1 View Result Date: 05/07/2024 CLINICAL DATA:  Multiple falls, chest pain EXAM: CHEST  1 VIEW COMPARISON:  04/10/2024 FINDINGS: Single frontal view of the chest demonstrates stable right chest wall  port, pacemaker within the left anterior chest, and epicardial pacing leads from an inferior approach. Cardiac silhouette is stable. No acute airspace disease, effusion, or pneumothorax. No acute displaced fractures. IMPRESSION: 1. Stable chest, no acute process. Electronically Signed   By: Bobbye Burrow [name]   On: 05/07/2024 17:28   DG Knee Complete 4 Views Left Result Date: 05/07/2024 CLINICAL DATA:  Palpable abnormality left lower leg EXAM: LEFT KNEE - COMPLETE 4+ VIEW COMPARISON:  12/09/2023 FINDINGS: Frontal, bilateral oblique, and lateral views of the left knee are obtained. No acute fracture, subluxation, or dislocation. Stable mild 3 compartmental osteoarthritis with prominent chondrocalcinosis in the lateral compartment. No joint effusion. There is prominent soft tissue swelling anterior to the tibial tuberosity, and extending along the infrapatellar and prepatellar soft tissues. This is not appreciably changed since prior exam. No subcutaneous gas or radiopaque foreign body. IMPRESSION: 1. Soft tissue swelling anterior to the tibial tuberosity, and extending into the infrapatellar and prepatellar regions, unchanged since prior exam. 2. No acute bony abnormality. 3. Stable osteoarthritis. Electronically Signed   By: Bobbye Burrow [name]   On: 05/07/2024 17:26   CT Cervical Spine Wo Contrast Result Date: 05/07/2024 CLINICAL DATA:  Multiple falls over last 2 days EXAM: CT CERVICAL SPINE WITHOUT CONTRAST TECHNIQUE: Multidetector CT imaging of the cervical spine was performed without intravenous contrast. Multiplanar CT image reconstructions were also generated. RADIATION  DOSE REDUCTION: This exam was performed according to the departmental dose-optimization program which includes automated exposure control, adjustment of the mA and/or kV according to patient size and/or use of iterative reconstruction technique. COMPARISON:  11/10/2023 FINDINGS: Alignment: Mild right convex scoliosis with straightening  of the cervical spine unchanged. Mild degenerative retrolisthesis of C5 on C6 again noted. 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 No acute cervical spine fractures. No destructive bony abnormalities. Soft tissues and spinal canal: No prevertebral fluid or swelling. No visible canal hematoma. Disc levels: Stable severe spondylosis at C5-6. More mild spondylosis throughout the remainder of the cervical spine. Mild left predominant facet hypertrophy unchanged. Upper chest: Airway is patent. Visualized portions of the lung apices are clear. Other: Reconstructed images demonstrate no additional findings. IMPRESSION: 1. No acute cervical spine fracture. 2. 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 3. Stable multilevel cervical degenerative changes. Electronically Signed   By: Bobbye Burrow [name]   On: 05/07/2024 17:13   CT Head Wo Contrast Result Date: 05/07/2024 CLINICAL DATA:  Multiple falls over last 2 days EXAM: CT HEAD WITHOUT CONTRAST TECHNIQUE: Contiguous axial images were obtained from the base of the skull through the vertex without intravenous contrast. RADIATION DOSE REDUCTION: This exam was performed according to the departmental dose-optimization program which includes automated exposure control, adjustment of the mA and/or kV according to patient size and/or use of iterative reconstruction technique. COMPARISON:  04/10/2024 FINDINGS: Brain: No acute infarct or hemorrhage. Stable hypodensities of the bilateral basal ganglia consistent with chronic lacunar infarct. Lateral ventricles and remaining midline structures are unremarkable. No acute extra-axial fluid collections. No mass effect. Vascular: No hyperdense vessel or unexpected calcification. Skull: Normal. Negative for fracture or focal lesion. Sinuses/Orbits: Partial opacification of the right frontal sinus. Remaining visualized paranasal sinuses are clear. Other:  None. IMPRESSION: 1. No acute intracranial process. 2. Right frontal sinus disease. Electronically Signed   By: Bobbye Burrow [name]   On: 05/07/2024 17:10    Assessment and Plan  [name] is a 43 year old female with history of cerebral palsy, hypertension, GERD, POTS syndrome, SA node dysfunction status post pacemaker placement, history of DVT on Eliquis , who presents to the emergency department for chief concerns of multiple falls over the last 2 days and patient being concern for low magnesium  level.   Hypokalemia  Hypomagnesemia--Recurrent --Pharmacy to manage electrolyte.  --Mag and K repleted --remains in SR   DVT (deep venous thrombosis) (HCC) --cont eliquis    Protein-calorie malnutrition, moderate (HCC) --Follow RD recs   GERD without esophagitis --cotn PPI   Essential hypertension  --bp soft. Not on any meds  Low back pain New from baseline Port lumbar spine x ray --?L5 height loss--ordered lateral view   SA node dysfunction (HCC) --Status post pacemaker placement  Pt is bedbound at home with transfer to [facility] TOC for d/c planning to home if remains stable in am    Procedures: Family communication :none today Consults : CODE STATUS: FULL DVT Prophylaxis :eliquis  Level of care: Telemetry Medical Status is: Observation The patient remains OBS appropriate and will d/c before 2 midnights.    TOTAL TIME TAKING CARE OF THIS PATIENT: 35 minutes.  >50% time spent on counselling and coordination of care  Note: This dictation was prepared with Dragon dictation along with smaller phrase technology. Any transcriptional errors that result from this process are unintentional.  Melvinia Stager M.D    [facility]  CC: Primary care physician; Baltazar Leventhal, MD

## 2024-05-08 NOTE — Progress Notes (Signed)
 Initial Nutrition Assessment  DOCUMENTATION CODES:   Underweight, Non-severe (moderate) malnutrition in context of chronic illness  INTERVENTION:   -Liberalize diet to regular for widest variety of meal selections -MVI with minerals daily -Ensure Enlive po BID, each supplement provides 350 kcal and 20 grams of protein.   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  GOAL:   Patient will meet greater than or equal to 90% of their needs  MONITOR:   PO intake, Supplement acceptance  REASON FOR ASSESSMENT:   Consult Assessment of nutrition requirement/status  ASSESSMENT:   Pt with history of cerebral palsy, hypertension, GERD, POTS, SA node dysfunction status post pacemaker placement, history of DVT on Eliquis , who presents for chief concerns of multiple falls over the last 2 days and patient being concern for low magnesium  level.  Pt admitted with hypokalemia.   Reviewed I/Os': +50 ml x 24 hours   Pt familiar to this RD due to recent prior admission. Pt is difficult to understand at times. No family present at time of visit to obtain additional history.   Case discussed with nurse tech, who reports pt doing well today. Pt offers no complaints other than constipation and declined interview at this time.   Pt currently on a heart healthy duet. Noted meal completions 100%.   Wt has been stable over the past 2 months.   Medications reviewed and include vitamin D3, vitamin B-12, protonix , and carafate .   Labs reviewed: CBGS: 115.    NUTRITION - FOCUSED PHYSICAL EXAM:  Flowsheet Row Most Recent Value  Orbital Region No depletion  Upper Arm Region Mild depletion  Thoracic and Lumbar Region Mild depletion  Buccal Region No depletion  Temple Region Mild depletion  Clavicle Bone Region Mild depletion  Clavicle and Acromion Bone Region Mild  depletion  Scapular Bone Region No depletion  Dorsal Hand Moderate depletion  Patellar Region Mild depletion  Anterior Thigh Region Mild depletion  Posterior Calf Region Mild depletion  Edema (RD Assessment) None  Hair Reviewed  Eyes Reviewed  Mouth Reviewed  Skin Reviewed  Nails Reviewed       Diet Order:   Diet Order             Diet regular Room service appropriate? Yes; Fluid consistency: Thin  Diet effective now                   EDUCATION NEEDS:   No education needs have been identified at this time  Skin:  Skin Assessment: Reviewed RN Assessment  Last BM:  Unknown  Height:   Ht Readings from Last 1 Encounters:  05/07/24 5\' 2"  (1.575 m)    Weight:   Wt Readings from Last 1 Encounters:  05/07/24 42.5 kg    Ideal Body Weight:  50 kg  BMI:  Body mass index is 17.14 kg/m.  Estimated Nutritional Needs:   Kcal:  1350-1550  Protein:  65-80 grams  Fluid:  1.3-1.5 L    Herschel Lords, RD, LDN, CDCES Registered Dietitian III Certified Diabetes Care and Education Specialist If unable to reach this RD, please use "RD Inpatient" group chat on secure chat between hours of 8am-4 pm daily

## 2024-05-08 NOTE — Consult Note (Signed)
 Pharmacy Consult Note - Electrolytes  Sodium (mmol/L)  Date Value  05/08/2024 140  01/19/2015 142   Potassium (mmol/L)  Date Value  05/08/2024 3.6  01/19/2015 3.4 (L)   Magnesium  (mg/dL)  Date Value  41/32/4401 1.9  01/19/2015 1.0 (L)   Calcium  (mg/dL)  Date Value  02/72/5366 8.7 (L)   Calcium , Total (mg/dL)  Date Value  44/02/4741 8.3 (L)   Albumin (g/dL)  Date Value  59/56/3875 3.8  06/14/2014 4.0   Phosphorus (mg/dL)  Date Value  64/33/2951 3.1   ASSESSMENT: 43 y.o. female with PMH including cerebral palsy who presents with falling, endorses she thinks her magnesium  is low. Pharmacy has been consulted to monitor and replace electrolytes. Patient's renal function is currently stable.   mIVF: N/A Pertinent medications: N/A  Goal of Therapy:  Electrolytes WNL  PLAN: K+ 3.6 s/p KCl 40 mEq PO x 2 doses and mag 1.9 s/p magnesium  sulfate 2g IV x 2 doses No electrolyte replacement indicated at this time  Check electrolytes with next AM labs  Thank you for allowing pharmacy to be a part of this patient's care.  Pansy Bogus, PharmD Pharmacy Resident  05/08/2024 9:59 AM

## 2024-05-09 DIAGNOSIS — E876 Hypokalemia: Secondary | ICD-10-CM | POA: Diagnosis not present

## 2024-05-09 LAB — BASIC METABOLIC PANEL WITH GFR
Anion gap: 11 (ref 5–15)
BUN: 15 mg/dL (ref 6–20)
CO2: 27 mmol/L (ref 22–32)
Calcium: 9 mg/dL (ref 8.9–10.3)
Chloride: 104 mmol/L (ref 98–111)
Creatinine, Ser: 0.65 mg/dL (ref 0.44–1.00)
GFR, Estimated: >60 mL/min (ref >60)
Glucose, Bld: 112 mg/dL — ABNORMAL HIGH (ref 70–99)
Potassium: 3.4 mmol/L — ABNORMAL LOW (ref 3.5–5.1)
Sodium: 142 mmol/L (ref 135–145)

## 2024-05-09 LAB — MAGNESIUM: Magnesium: 1.5 mg/dL — ABNORMAL LOW (ref 1.7–2.4)

## 2024-05-09 MED ORDER — MAGNESIUM SULFATE 2 GM/50ML IV SOLN: 2.0000 g | Freq: Once | INTRAVENOUS | Status: DC

## 2024-05-09 MED ORDER — HEPARIN SOD (PORK) LOCK FLUSH 100 UNIT/ML IV SOLN
500.0000 [IU] | Freq: Once | INTRAVENOUS | Status: AC
  Administered 2024-05-09: 500 [IU] via INTRAVENOUS
  Filled 2024-05-09: qty 5

## 2024-05-09 MED ORDER — POTASSIUM CHLORIDE CRYS ER 20 MEQ PO TBCR
40.0000 meq | EXTENDED_RELEASE_TABLET | Freq: Once | ORAL | Status: AC
  Administered 2024-05-09: 40 meq via ORAL
  Filled 2024-05-09: qty 2

## 2024-05-09 MED ORDER — MAG GLYCINATE 100 MG PO TABS: 1.0000 | ORAL_TABLET | Freq: Every day | ORAL | Status: DC

## 2024-05-09 NOTE — Discharge Summary (Signed)
 Physician Discharge Summary   Patient: [name] MRN: [id number]  DOB: [birth year]   Admit:     Date of Admission: 05/07/2024 Admitted from: home   Discharge: Date of discharge: 05/09/24 Disposition: Home Condition at discharge: good  CODE STATUS: FULL CODE     Discharge Physician: Melodi Sprung, DO [facility]     PCP: Baltazar Leventhal, MD  Recommendations for Outpatient Follow-up:  Follow up with PCP Derrick Fling Dianna Fortis, MD tomorrow as scheduled Please obtain labs/tests: BMP, Mag level       Discharge Diagnoses: Principal Problem:   Hypokalemia Active Problems:   Hypomagnesemia   Electrolyte abnormality   Essential hypertension   IBS (irritable bowel syndrome)   GERD without esophagitis   Protein-calorie malnutrition, moderate (HCC)   DVT (deep venous thrombosis) (HCC)   Cerebral palsy, unspecified (HCC)   Fall at home, initial encounter   SA node dysfunction (HCC)   Low back pain             Hospital course / significant events:   [name] is a 43 year old female with history of cerebral palsy, hypertension, GERD, POTS, SA node dysfunction status post pacemaker placement, history of DVT on Eliquis , who presents to the emergency department for chief concerns of multiple falls over the last 2 days and patient being concern for low magnesium  level.  Vitals in the ED showed T of 98.2, rr of 16, hr 87, blood pressure 135/77, SpO2 of 100% on room air.  Serum sodium is 142, potassium 2.8, chloride 105, bicarb 27, BUN 16, serum creatinine 0.68, EGFR greater than 60, nonfasting blood glucose 104, WBC 3.9, hemoglobin 10.6, platelets of 111.  UA was negative for leukocytes and nitrates.  Admitted to hospitalist and K and Mg repleted. Stable for d/c AM 05/09/24     Consultants:  none  Procedures/Surgeries: none      ASSESSMENT & PLAN:   Hypokalemia  Hypomagnesemia--Recurrent Mag and K repleted Follow  outpatient   DVT (deep venous thrombosis) cont eliquis    Protein-calorie malnutrition, moderate  RD recs   GERD without esophagitis PPI   Hx Essential hypertension BP here soft. Not on any meds followoutpatient   Low back pain New from baseline Stable multilevel degenerative disc disease. No acute findings.  Follow outpatient   SA node dysfunction  Status post pacemaker placement      underweight based on BMI: Body mass index is 17.14 kg/m.Aaron Aas Significantly low or high BMI is associated with higher medical risk.  Underweight - under 18  overweight - 25 to 29 obese - 30 or more Class 1 obesity: BMI of 30.0 to 34 Class 2 obesity: BMI of 35.0 to 39 Class 3 obesity: BMI of 40.0 to 49 Super Morbid Obesity: BMI 50-59 Super-super Morbid Obesity: BMI 60+ Healthy nutrition and physical activity advised as adjunct to other disease management and risk reduction treatments         Discharge Instructions  Allergies as of 05/09/2024       Reactions   Duloxetine    Other reaction(s): Other (See Comments), Palpitations Other reaction(s): Hallucinations, Hyperactive behavior (finding), Other (See Comments) Stressed out   Flecainide    Other reaction(s): Other (See Comments) Other reaction(s): Hypomagnesemia (disorder), Other (See Comments) Other Reaction: QUESTION HYPOMAGNESEMIA   Fluoxetine    Other reaction(s): Nausea And Vomiting, Other (See Comments) Other reaction(s): Other (See Comments) Other Reaction: INCREASED HR  causes irritability and irrational behavior "I go crazy"  Gabapentin    Goes crazy Other reaction(s): Other (See Comments) Other reaction(s): Hyperactive behavior (finding), Mental Status Changes (intolerance), Other (See Comments) Goes crazy Hallucinations   Baclofen    Other reaction(s): Other (See Comments), Other (See Comments) Weakness  Other reaction(s): Other (See Comments) Weakness and loose Weakness  Other reaction(s):  Weakness     Cephalosporins    Other reaction(s): Hives, Rash Received pre-op vancomycin and cefazolin 07/20/16. Developed urticarial rash on bilateral lower extremities + slight periorbital swelling. Treated with diphenhydramine . Received pre-op vancomycin and cefazolin 07/20/16. Developed urticarial rash on bilateral lower extremities + slight periorbital swelling. Treated with diphenhydramine . Received pre-op vancomycin and cefazolin 07/20/16. Developed urticarial rash on bilateral lower extremities + slight periorbital swelling. Treated with diphenhydramine .   Sulfasalazine    Other reaction(s): Nausea And Vomiting   Sulfa Antibiotics Nausea And Vomiting   Vancomycin    Other reaction(s): Hives Received pre-op vancomycin and cefazolin 07/20/16. Developed urticarial rash on bilateral lower extremities + slight periorbital swelling. Treated with diphenhydramine . Received pre-op vancomycin and cefazolin 07/20/16. Developed urticarial rash on bilateral lower extremities + slight periorbital swelling. Treated with diphenhydramine . Received pre-op vancomycin and cefazolin 07/20/16. Developed urticarial rash on bilateral lower extremities + slight periorbital swelling. Treated with diphenhydramine .   Cefazolin Rash   Tolerated ceftriaxone  2023 and cephalexin  2022        Medication List     STOP taking these medications    magnesium  chloride 200 MG/ML Soln       TAKE these medications    acetaminophen  325 MG tablet Commonly known as: TYLENOL  Take 2 tablets (650 mg total) by mouth every 6 (six) hours as needed for mild pain (pain score 1-3) or fever.   bisacodyl  5 MG EC tablet Commonly known as: DULCOLAX Take 1-2 tablets (5-10 mg total) by mouth at bedtime.   bisacodyl  10 MG suppository Commonly known as: DULCOLAX Place 1 suppository (10 mg total) rectally daily as needed for severe constipation.   cyanocobalamin  1000 MCG tablet Commonly known as: VITAMIN B12 Take 1,000 mcg by mouth daily.    D2000 Ultra Strength 50 MCG (2000 UT) Caps Generic drug: Cholecalciferol  Take 1 tablet by mouth daily.   dapagliflozin  propanediol 10 MG Tabs tablet Commonly known as: FARXIGA  Take 1 tablet by mouth daily.   Eliquis  5 MG Tabs tablet Generic drug: apixaban  Take 5 mg by mouth 2 (two) times daily.   heparin  lock flush 100 UNIT/ML Soln injection Inject 5 mL (500 Units total) as directed as needed for line care. Flush using SASH (saline, administer  IV, saline, heparin ) method as directed. Flush IV catheter with heparin  after the last saline flush.  Use syringe ONE TIME only then discard. Storage: Room Temperature.   Linzess  290 MCG Caps capsule Generic drug: linaclotide  Take 290 mcg by mouth daily.   Mag Glycinate 100 MG Tabs Take 1 tablet by mouth daily.   methocarbamol  750 MG tablet Commonly known as: ROBAXIN  Take 750 mg by mouth at bedtime as needed.   montelukast  10 MG tablet Commonly known as: SINGULAIR  Take 10 mg by mouth daily as needed (allergies).   pantoprazole  40 MG tablet Commonly known as: PROTONIX  Take 40 mg by mouth daily as needed.   potassium chloride  SA 20 MEQ tablet Commonly known as: KLOR-CON  M Take 20 mEq by mouth 2 (two) times daily as needed.   pregabalin  150 MG capsule Commonly known as: LYRICA  Take 150 mg by mouth in the morning, at noon, and at bedtime.  Premarin vaginal cream Generic drug: conjugated estrogens Place 0.5 g vaginally 2 (two) times a week.   promethazine  25 MG tablet Commonly known as: PHENERGAN  Take 12.5 mg by mouth every 6 (six) hours as needed for nausea or vomiting.   Ritalin  20 MG tablet Generic drug: methylphenidate  Take 20 mg by mouth 3 (three) times daily. Take 1 tablet (20 mg) TID at 0800, 1200 & 2100-2200   sucralfate  1 g tablet Commonly known as: CARAFATE  Take 1 g by mouth 4 (four) times daily.   Tenapanor HCl 50 MG Tabs Take 50 mg by mouth 2 (two) times daily.   traMADol  50 MG tablet Commonly known as:  ULTRAM  Take 1 tablet by mouth every 12 (twelve) hours as needed.         Follow-up Information     Olmedo, Dianna Fortis, MD Follow up.   Specialty: Family Medicine Why: Hospital follow up Contact information: [address] [place] [address] [phone]                 Allergies  Allergen Reactions   Duloxetine     Other reaction(s): Other (See Comments), Palpitations Other reaction(s): Hallucinations, Hyperactive behavior (finding), Other (See Comments) Stressed out     Flecainide     Other reaction(s): Other (See Comments) Other reaction(s): Hypomagnesemia (disorder), Other (See Comments) Other Reaction: QUESTION HYPOMAGNESEMIA     Fluoxetine     Other reaction(s): Nausea And Vomiting, Other (See Comments) Other reaction(s): Other (See Comments) Other Reaction: INCREASED HR  causes irritability and irrational behavior "I go crazy"   Gabapentin     Goes crazy Other reaction(s): Other (See Comments) Other reaction(s): Hyperactive behavior (finding), Mental Status Changes (intolerance), Other (See Comments) Goes crazy Hallucinations   Baclofen     Other reaction(s): Other (See Comments), Other (See Comments) Weakness  Other reaction(s): Other (See Comments) Weakness and loose Weakness  Other reaction(s):  Weakness     Cephalosporins     Other reaction(s): Hives, Rash Received pre-op vancomycin and cefazolin 07/20/16. Developed urticarial rash on bilateral lower extremities + slight periorbital swelling. Treated with diphenhydramine . Received pre-op vancomycin and cefazolin 07/20/16. Developed urticarial rash on bilateral lower extremities + slight periorbital swelling. Treated with diphenhydramine . Received pre-op vancomycin and cefazolin 07/20/16. Developed urticarial rash on bilateral lower extremities + slight periorbital swelling. Treated with diphenhydramine .   Sulfasalazine     Other reaction(s): Nausea And Vomiting   Sulfa Antibiotics Nausea  And Vomiting   Vancomycin     Other reaction(s): Hives Received pre-op vancomycin and cefazolin 07/20/16. Developed urticarial rash on bilateral lower extremities + slight periorbital swelling. Treated with diphenhydramine . Received pre-op vancomycin and cefazolin 07/20/16. Developed urticarial rash on bilateral lower extremities + slight periorbital swelling. Treated with diphenhydramine . Received pre-op vancomycin and cefazolin 07/20/16. Developed urticarial rash on bilateral lower extremities + slight periorbital swelling. Treated with diphenhydramine .   Cefazolin Rash    Tolerated ceftriaxone  2023 and cephalexin  2022     Subjective: pt reports eager for discharge, her ride will be here soon. No other complaints at this tie   Discharge Exam: BP (!) 139/115 (BP Location: Left Arm)   Pulse 88   Temp 98 F (36.7 C)   Resp 16   Ht 5\' 2"  (1.575 m)   Wt 42.5 kg   LMP 06/09/2012   SpO2 100%   BMI 17.14 kg/m  General: Pt is alert, awake, not in acute distress .Thin/frail  Cardiovascular: RRR, S1/S2 +, no rubs, no gallops Respiratory:  CTA bilaterally, no wheezing, no rhonchi Abdominal: Soft, NT, ND Extremities: contractures, no edema, no cyanosis     The results of significant diagnostics from this hospitalization (including imaging, microbiology, ancillary and laboratory) are listed below for reference.     Microbiology: No results found for this or any previous visit (from the past 240 hours).   Labs: BNP (last 3 results) No results for input(s): "BNP" in the last 8760 hours. Basic Metabolic Panel: Recent Labs  Lab 05/07/24 1702 05/08/24 0851 05/09/24 0457  NA 143 140 142  K 2.8* 3.6 3.4*  CL 105 105 104  CO2 27 24 27   GLUCOSE 104* 88 112*  BUN 16 17 15   CREATININE 0.68 0.82 0.65  CALCIUM  9.2 8.7* 9.0  MG 1.0* 1.9 1.5*  PHOS 3.7 3.1  --    Liver Function Tests: Recent Labs  Lab 05/07/24 1702  AST 22  ALT 25  ALKPHOS 73  BILITOT 1.0  PROT 6.3*  ALBUMIN 3.8    No results for input(s): "LIPASE", "AMYLASE" in the last 168 hours. No results for input(s): "AMMONIA" in the last 168 hours. CBC: Recent Labs  Lab 05/07/24 1702 05/08/24 0851  WBC 3.9* 7.5  NEUTROABS 2.6  --   HGB 10.6* 10.1*  HCT 32.3* 30.5*  MCV 78.8* 78.2*  PLT 111* 86*   Cardiac Enzymes: No results for input(s): "CKTOTAL", "CKMB", "CKMBINDEX", "TROPONINI" in the last 168 hours. BNP: Invalid input(s): "POCBNP" CBG: No results for input(s): "GLUCAP" in the last 168 hours. D-Dimer No results for input(s): "DDIMER" in the last 72 hours. Hgb A1c No results for input(s): "HGBA1C" in the last 72 hours. Lipid Profile No results for input(s): "CHOL", "HDL", "LDLCALC", "TRIG", "CHOLHDL", "LDLDIRECT" in the last 72 hours. Thyroid  function studies No results for input(s): "TSH", "T4TOTAL", "T3FREE", "THYROIDAB" in the last 72 hours.  Invalid input(s): "FREET3" Anemia work up No results for input(s): "VITAMINB12", "FOLATE", "FERRITIN", "TIBC", "IRON", "RETICCTPCT" in the last 72 hours. Urinalysis    Component Value Date/Time   COLORURINE YELLOW (A) 05/07/2024 1702   APPEARANCEUR CLEAR (A) 05/07/2024 1702   APPEARANCEUR Hazy 02/11/2014 1737   LABSPEC 1.017 05/07/2024 1702   LABSPEC 1.020 02/11/2014 1737   PHURINE 6.0 05/07/2024 1702   GLUCOSEU NEGATIVE 05/07/2024 1702   GLUCOSEU Negative 02/11/2014 1737   HGBUR SMALL (A) 05/07/2024 1702   BILIRUBINUR NEGATIVE 05/07/2024 1702   BILIRUBINUR Negative 02/11/2014 1737   KETONESUR 5 (A) 05/07/2024 1702   PROTEINUR NEGATIVE 05/07/2024 1702   UROBILINOGEN 0.2 04/18/2014 1640   NITRITE NEGATIVE 05/07/2024 1702   LEUKOCYTESUR NEGATIVE 05/07/2024 1702   LEUKOCYTESUR Negative 02/11/2014 1737   Sepsis Labs Recent Labs  Lab 05/07/24 1702 05/08/24 0851  WBC 3.9* 7.5   Microbiology No results found for this or any previous visit (from the past 240 hours). Imaging DG Lumbar Spine 2-3 Views Result Date: 05/08/2024 CLINICAL  DATA:  Low back pain. EXAM: LUMBAR SPINE - 2-3 VIEW COMPARISON:  03/13/2024 FINDINGS: Five non-rib-bearing lumbar vertebra. Stable lumbar alignment. Slight exaggerated lumbar lordosis. No evidence of fracture or compression deformity. Stable multilevel degenerative disc disease with primarily anterior spurring. No visible pars defects or focal bone abnormalities. Spinal stimulator is partially included in the field of view. IMPRESSION: Stable multilevel degenerative disc disease. No acute findings. Electronically Signed   By: Chadwick Colonel [name]   On: 05/08/2024 18:37   DG Spine Portable 1 View Result Date: 05/07/2024 CLINICAL DATA:  Low back pain after a fall EXAM: PORTABLE SPINE -  1 VIEW COMPARISON:  CT abdomen pelvis 04/10/2024 FINDINGS: Question height loss in the L5 vertebral body. Lateral view of the lumbar spine would be help for further evaluation. IMPRESSION: 1. Question height loss in the L5 vertebral body. Lateral view of the lumbar spine would be help for further evaluation. Electronically Signed   By: Rozell Cornet [name]   On: 05/07/2024 21:38   DG Chest 1 View Result Date: 05/07/2024 CLINICAL DATA:  Multiple falls, chest pain EXAM: CHEST  1 VIEW COMPARISON:  04/10/2024 FINDINGS: Single frontal view of the chest demonstrates stable right chest wall port, pacemaker within the left anterior chest, and epicardial pacing leads from an inferior approach. Cardiac silhouette is stable. No acute airspace disease, effusion, or pneumothorax. No acute displaced fractures. IMPRESSION: 1. Stable chest, no acute process. Electronically Signed   By: Bobbye Burrow [name]   On: 05/07/2024 17:28   DG Knee Complete 4 Views Left Result Date: 05/07/2024 CLINICAL DATA:  Palpable abnormality left lower leg EXAM: LEFT KNEE - COMPLETE 4+ VIEW COMPARISON:  12/09/2023 FINDINGS: Frontal, bilateral oblique, and lateral views of the left knee are obtained. No acute fracture, subluxation, or dislocation. Stable mild 3  compartmental osteoarthritis with prominent chondrocalcinosis in the lateral compartment. No joint effusion. There is prominent soft tissue swelling anterior to the tibial tuberosity, and extending along the infrapatellar and prepatellar soft tissues. This is not appreciably changed since prior exam. No subcutaneous gas or radiopaque foreign body. IMPRESSION: 1. Soft tissue swelling anterior to the tibial tuberosity, and extending into the infrapatellar and prepatellar regions, unchanged since prior exam. 2. No acute bony abnormality. 3. Stable osteoarthritis. Electronically Signed   By: Bobbye Burrow [name]   On: 05/07/2024 17:26   CT Cervical Spine Wo Contrast Result Date: 05/07/2024 CLINICAL DATA:  Multiple falls over last 2 days EXAM: CT CERVICAL SPINE WITHOUT CONTRAST TECHNIQUE: Multidetector CT imaging of the cervical spine was performed without intravenous contrast. Multiplanar CT image reconstructions were also generated. RADIATION DOSE REDUCTION: This exam was performed according to the departmental dose-optimization program which includes automated exposure control, adjustment of the mA and/or kV according to patient size and/or use of iterative reconstruction technique. COMPARISON:  11/10/2023 FINDINGS: Alignment: Mild right convex scoliosis with straightening of the cervical spine unchanged. Mild degenerative retrolisthesis of C5 on C6 again noted. 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 No acute cervical spine fractures. No destructive bony abnormalities. Soft tissues and spinal canal: No prevertebral fluid or swelling. No visible canal hematoma. Disc levels: Stable severe spondylosis at C5-6. More mild spondylosis throughout the remainder of the cervical spine. Mild left predominant facet hypertrophy unchanged. Upper chest: Airway is patent. Visualized portions of the lung apices are clear. Other: Reconstructed images demonstrate no additional  findings. IMPRESSION: 1. No acute cervical spine fracture. 2. 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 3. Stable multilevel cervical degenerative changes. Electronically Signed   By: Bobbye Burrow [name]   On: 05/07/2024 17:13   CT Head Wo Contrast Result Date: 05/07/2024 CLINICAL DATA:  Multiple falls over last 2 days EXAM: CT HEAD WITHOUT CONTRAST TECHNIQUE: Contiguous axial images were obtained from the base of the skull through the vertex without intravenous contrast. RADIATION DOSE REDUCTION: This exam was performed according to the departmental dose-optimization program which includes automated exposure control, adjustment of the mA and/or kV according to patient size and/or use of iterative reconstruction technique. COMPARISON:  04/10/2024 FINDINGS: Brain:  No acute infarct or hemorrhage. Stable hypodensities of the bilateral basal ganglia consistent with chronic lacunar infarct. Lateral ventricles and remaining midline structures are unremarkable. No acute extra-axial fluid collections. No mass effect. Vascular: No hyperdense vessel or unexpected calcification. Skull: Normal. Negative for fracture or focal lesion. Sinuses/Orbits: Partial opacification of the right frontal sinus. Remaining visualized paranasal sinuses are clear. Other: None. IMPRESSION: 1. No acute intracranial process. 2. Right frontal sinus disease. Electronically Signed   By: Bobbye Burrow [name]   On: 05/07/2024 17:10      Time coordinating discharge: over 30 minutes  SIGNED:  Dezirea Mccollister DO [facility]

## 2024-05-09 NOTE — Consult Note (Signed)
 Pharmacy Consult Note - Electrolytes  Sodium (mmol/L)  Date Value  05/09/2024 142  01/19/2015 142   Potassium (mmol/L)  Date Value  05/09/2024 3.4 (L)  01/19/2015 3.4 (L)   Magnesium  (mg/dL)  Date Value  91/47/8295 1.5 (L)  01/19/2015 1.0 (L)   Calcium  (mg/dL)  Date Value  62/13/0865 9.0   Calcium , Total (mg/dL)  Date Value  78/46/9629 8.3 (L)   Albumin (g/dL)  Date Value  52/84/1324 3.8  06/14/2014 4.0   Phosphorus (mg/dL)  Date Value  40/09/2724 3.1   ASSESSMENT: 43 y.o. female with PMH including cerebral palsy who presents with falling, endorses she thinks her magnesium  is low. Pharmacy has been consulted to monitor and replace electrolytes. Patient's renal function is currently stable.   mIVF: N/A Pertinent medications: N/A  Goal of Therapy:  Electrolytes WNL  PLAN: K+ 3.4 - will order KCl 40 mEq PO x 1 dose  Mag 1.5 - will order magnesium  sulfate 2 gm IV x 1 dose  Check electrolytes with next AM labs  Thank you for allowing pharmacy to be a part of this patient's care.  Pansy Bogus, PharmD Pharmacy Resident  05/09/2024 7:06 AM

## 2024-05-09 NOTE — TOC Progression Note (Signed)
 Transition of Care [facility]) - Progression Note    Patient Details  Name: [name] MRN: [id number] Date of Birth: [birth year]  [facility] CM/SW Contact  Elsie Halo, RN Phone Number: 05/09/2024, 9:30 AM  Clinical Narrative:     Received voicemail message from the patient's mother, Sosha Shepherd [phone]  on Tuesday at 4:59 PM. Per Tammy, the patient has a ride home when she is medically ready for discharge.  8:45 AM TOC reached out to [employer] but there was no option to leave a message  9:30 AM TOC called to Adora Aland and left a voicemail message requesting call back.  TOC will continue to follow.        Expected Discharge Plan and Services         Expected Discharge Date: 05/09/24                                     Social Determinants of Health (SDOH) Interventions SDOH Screenings   Food Insecurity: No Food Insecurity (05/07/2024)  Housing: Low Risk  (05/07/2024)  Recent Concern: Housing - High Risk (04/14/2024)   Received from [facility], [facility]  Transportation Needs: Unmet Transportation Needs (05/07/2024)  Utilities: Not At Risk (05/07/2024)  Financial Resource Strain: Low Risk  (04/14/2024)   Received from [facility], [facility]  Social Connections: Socially Isolated (05/07/2024)  Stress: Stress Concern Present (07/20/2022)   Received from [facility], [facility]  Tobacco Use: Low Risk  (05/07/2024)    Readmission Risk Interventions     No data to display

## 2024-05-11 DIAGNOSIS — Z7689 Persons encountering health services in other specified circumstances: Secondary | ICD-10-CM | POA: Diagnosis not present

## 2024-05-14 DIAGNOSIS — Z7689 Persons encountering health services in other specified circumstances: Secondary | ICD-10-CM | POA: Diagnosis not present

## 2024-05-16 DIAGNOSIS — Z7689 Persons encountering health services in other specified circumstances: Secondary | ICD-10-CM | POA: Diagnosis not present

## 2024-05-21 DIAGNOSIS — Z7689 Persons encountering health services in other specified circumstances: Secondary | ICD-10-CM | POA: Diagnosis not present

## 2024-05-22 DIAGNOSIS — Z7689 Persons encountering health services in other specified circumstances: Secondary | ICD-10-CM | POA: Diagnosis not present

## 2024-05-24 DIAGNOSIS — K5909 Other constipation: Secondary | ICD-10-CM | POA: Diagnosis not present

## 2024-05-24 DIAGNOSIS — L84 Corns and callosities: Secondary | ICD-10-CM | POA: Diagnosis not present

## 2024-05-24 DIAGNOSIS — Z7689 Persons encountering health services in other specified circumstances: Secondary | ICD-10-CM | POA: Diagnosis not present

## 2024-05-24 DIAGNOSIS — E876 Hypokalemia: Secondary | ICD-10-CM | POA: Diagnosis not present

## 2024-05-25 DIAGNOSIS — Z7689 Persons encountering health services in other specified circumstances: Secondary | ICD-10-CM | POA: Diagnosis not present

## 2024-06-07 NOTE — Telephone Encounter (Signed)
 Appreciate the update. Agree with refills.  You can pend and I will sign.  Note [name] was considering and upper endoscopy with possible endoflip of the pylorus.  So trying to hold as Dr Edelmiro could do all of the testing at one time.  Hopefully the sucralfate  will help the abdominal discomfort in the stomach.  She can hold on the pepci while taking the sucralfate .  Pantoprazole  every morning and sucralfate  slurry before dinner and bedtime.  Unfortunately this will not help the constipation. Will see how the IBSrela works.  Thanks

## 2024-06-11 DIAGNOSIS — Z7689 Persons encountering health services in other specified circumstances: Secondary | ICD-10-CM | POA: Diagnosis not present

## 2024-06-14 NOTE — Progress Notes (Signed)
 [name]  is a 43 y.o.  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  She has had difficulties trying to get infusions.  In the past we had provided her for multiple referrals for her to get infusions.  Because she is homebound home health services have been listed.  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 Magnesium   Date Value Ref Range Status  05/24/2024 1.1 (L) 1.8 - 2.5 mg/dL Final  96/74/7974 1.2 (L) 1.8 - 2.5 mg/dL Final  96/85/7974 1.2 (L) 1.8 - 2.5 mg/dL Final  95/96/7985 1.4 (L) 1.8 - 2.5 mg/dL Final  96/95/7985 1.3 (L) 1.8 - 2.5 mg/dL Final  98/86/7985 1.4 (L) 1.8 - 2.5 mg/dL Final   Magnesium , Random Urine  Date Value Ref Range Status  04/17/2010 10.6 mg/dL Final    Comment:              Because concentrations of solute in urine can vary markedly due           to diet and water intake, reference ranges in random urine           specimens have little or no clinical value.  11/23/2006 16.1 * mg/dL Corrected    Comment:    MG SPOT         CORRECTED FROM        * ON 11/24/06 AT 1214 BY [id number] RESULTS AVAILABLE ON 11-24-06 [id number]           Because concentrations of solute in urine can vary markedly due           to diet and water intake, reference ranges in random urine           specimens have little or no clinical value.   Hypokalemia The patient has a history of hypokalemia.  The most recent potassium level was  Lab Results  Component Value Date   K 3.4 (L) 05/24/2024   The patient is supplementing with potassium chloride .  The patient currently denies experiencing any leg cramps, heart palpitations.  In addition the patient is supplementing with appropriate dietary measures.  At times she has needed also potassium  infusions supplementation.  Nausea and vomiting, unspecified vomiting type This is an ongoing problem.  The patient is in need of refill of her Phenergan .  Has had other medications in the past. -     promethazine  (PHENERGAN ) 25 MG tablet; Take 0.5 tablets (12.5 mg total) by mouth every 8 (eight) hours as needed for Nausea  CP (cerebral palsy), spastic (CMS/HHS-HCC) Patient with difficulties with mobility as well as unable to communicate verbally very clear.  She lives at home with her mother whom the patient reports is sick and she does not want presently to get out of her house.  Hypertensive heart disease with heart failure (CMS/HHS-HCC) The blood pressure today shows to be stable when compared to previous readings  BP Readings from Last 3 Encounters:  04/24/24 109/76  04/05/24 120/77  03/06/24 118/79   , the patient is able to keep it controlled through appropriate diet.  The patient reports when checking the  blood pressure it remains near the levels as above. Denies headaches, chest pain, shortness of breath, peripheral edema.  No concerns about blood  pressure today.  Chronic constipation Patient also suffers from chronic constipation.  Multiple times calls to report abdominal pain and unable to have a bowel movement.  It seems that the lactulose  and other measures are not helping her.  She has to go to the emergency room when she is severely constipated to get smog enema.  She is seeing a gastroenterologist.  Elevated LDL cholesterol level In reviewing the medical record she has had issues with elevated LDL.  Currently not taking any medications. No results found for: CHOL Lab Results  Component Value Date   HDL 72 12/31/2022   Lab Results  Component Value Date   LDLCALC 112 12/31/2022   Lab Results  Component Value Date   TRIG 64 12/31/2022   No results found for: CHOLHDL     Allergies  Allergen Reactions  . Duloxetine Other (See Comments)    Stressed  out Other Reaction: INCREASED HR    . Flecainide Other (See Comments)    Other Reaction: QUESTION HYPOMAGNESEMIA     . Fluoxetine Nausea And Vomiting and Other (See Comments)     Other Reaction: INCREASED HR causes irritability and irrational behavior   . Gabapentin Other (See Comments)    Other Reaction: ALTERED MENTAL STATUS  . Baclofen Other (See Comments)    Weakness     . Sulfa (Sulfonamide Antibiotics) Nausea And Vomiting  . Adhesive Tape-Silicones Rash  . Ciprofloxacin  Rash  . Sulfasalazine Nausea And Vomiting  . Vancomycin Hives  . Cefazolin Rash    Tolerated ceftriaxone  2023 and cephalexin  2022    Prior to Admission medications  Medication Sig Taking? Last Dose  acetaminophen  (TYLENOL ) 500 MG tablet Take 500 mg by mouth every 6 (six) hours as needed Yes Taking  apixaban  (ELIQUIS ) 5 mg tablet Take 1 tablet (5 mg total) by mouth every 12 (twelve) hours Yes Taking  bisacodyL  (DULCOLAX) 10 mg suppository Place 10 mg rectally Yes   cholecalciferol , vitamin D3, (VITAMIN D3 ORAL) Take 1 tablet by mouth once daily Yes Taking  conjugated estrogens (PREMARIN) 0.625 mg/gram vaginal cream Place 0.5 g vaginally twice a week Apply half a gram (1/4 applicator) in the vagina twice weekly. Yes Taking  cyanocobalamin , vitamin B-12, (VITAMIN B12 ORAL) Take 1 tablet by mouth once daily Yes Taking  famotidine  (PEPCID ) 40 MG tablet Take 40 mg by mouth once daily Yes Taking  heparin  flush, PF,, porcine, 100 unit/mL injection by Intracatheter route once daily as needed Yes Taking  lactulose  (ENULOSE ) 10 gram/15 mL oral solution Take 30 mLs by mouth once daily for 90 days Yes Taking  LYRICA  150 mg capsule Take 1 capsule (150 mg total) by mouth 3 (three) times daily for 90 days Yes Taking  magnesium  glycinate 100 mg Tab Take 1 tablet by mouth once daily Yes   methocarbamoL  (ROBAXIN ) 750 MG tablet Take 1 tablet (750 mg total) by mouth at bedtime as needed Yes Taking  methylphenidate  HCl (RITALIN )  20 MG tablet Take 1 tablet (20 mg total) by mouth 3 (three) times daily. Yes Taking  metoclopramide  (REGLAN ) 5 MG tablet  Yes   montelukast  (SINGULAIR ) 10 mg tablet TAKE 1 TABLET AT BEDTIME AS NEEDED Yes Taking  pantoprazole  (PROTONIX ) 40 MG DR tablet  Yes Taking  potassium chloride  (KLOR-CON  M20) 20 MEQ ER tablet Take 20 mEq by mouth 2 (two) times daily Takes prn Yes Taking  promethazine  (PHENERGAN ) 25 MG tablet Take 0.5 tablets (12.5 mg total) by mouth every 8 (eight) hours as needed for  Nausea Yes   SMOG enema Place 960 mLs rectally 2 (two) times daily as needed (Give 2 doses the day before her surgery) Yes Taking  sucralfate  (CARAFATE ) 1 gram tablet TAKE 1 TABLET(1 GRAM) BY MOUTH FOUR TIMES DAILY BEFORE MEALS AND AT NIGHT Yes Taking  tenapanor 50 mg Tab Take 50 mg by mouth Yes   traMADoL  (ULTRAM ) 50 mg tablet Take 1 tablet (50 mg total) by mouth every 12 (twelve) hours as needed Yes Taking  traMADoL  (ULTRAM ) 50 mg tablet Take 1 tablet (50 mg total) by mouth every 12 (twelve) hours as needed Yes Taking  traMADoL  (ULTRAM ) 50 mg tablet Take 1 tablet (50 mg total) by mouth every 12 (twelve) hours as needed Yes Taking  FARXIGA  10 mg tablet Take 10 mg by mouth once daily    SMOG enema Place 200 mLs rectally once for 1 dose      Current medications, allergies, problem list, personally reviewed on Epic today.    Objective:   Vitals:   06/14/24 1413  Weight: (!) 43.1 kg (95 lb)  PainSc:   7   Body mass index is 17.37 kg/m.  General.  Alert and oriented, in no acute distress SKIN.  No rash, lesions, normal color, no diaphoresis HEENT:  [place]/AT, clear sclera, conjunctiva clear, Other exam deferred today due to COVID NECK.  no masses; no lymphadenopathy LUNGS.  Respirations unlabored; clear to auscultation bilaterally, no wheezing, rales or ronchi.  CARDIOVASCULAR.  Regular, rate, and rhythm without murmurs, gallops or rubs ABDOMEN.  Soft; nontender; nondistended; no masses or  organomegaly EXTREMITIES.  No edema or cyanosis.  Contractures upper and lower extremities NEUROLOGIC.  Normal mentation; moves all extremities well, sensation normal confined to wheelchair because of contractures on lower and upper extremities.   Assessment / Plan:   Hypomagnesemia Patient suffers from consistent bouts of hypomagnesemia, follows a nephrologist.  Has had numerous infusions but recently she has been having to go to the emergency room to get her magnesium  infusions because home health services locally have not been able to get along with the patient and the patient has missed multiple appointments. I requested to the patient to send us  the name of the agency that she believes will give her the services that she needs. -     Magnesium ; Future  Depression screening (Z13.31) -     Depression Screen -(PHQ- 2/9, BDI)  Encounter for behavioral health screening (Z13.30) -     Anxiety Screen [[id number]]  Hypokalemia Currently supplementing with oral potassium chloride .  In the past she has also needed infusions.  As above has had difficulties with home health and has needed to go to the emergency room. -     Comprehensive Metabolic Panel (CMP); Future  Nausea and vomiting, unspecified vomiting type This is an ongoing problem.  This together with her constipation.  She uses Phenergan  when she gets nauseous. -     promethazine  (PHENERGAN ) 25 MG tablet; Take 0.5 tablets (12.5 mg total) by mouth every 8 (eight) hours as needed for Nausea  CP (cerebral palsy), spastic (CMS/HHS-HCC) Seen by neurology.  The patient is stable.  Contractures in upper lower extremities, currently using a motorized wheelchair.  Hypertensive heart disease with heart failure (CMS/HHS-HCC) Blood pressure well-controlled.  Continue with current regimen.  Chronic constipation Patient has been encouraged to have supplementary fiber at least twice a day together with stool softeners to prevent constipation's.   Constipation is happening quite frequently and because has been severe she  has had to go to the emergency room to get smog enemas. She was encouraged to increase her dietary fiber, drink sufficient fluids and use supplementary fiber as well as stool softeners.  Elevated LDL cholesterol level LDL is slightly elevated. We will check levels today. -     Lipid Panel W/Reflex Direct Low Density Lipoprotein (LDL) Cholesterol; Future     This visit was coded based on medical decision making (MDM).   A copy of instructions have been given to the patient or responsible adult who demonstrated the ability to learn, asked appropriate questions, and verbalized understanding of the plan of care.  There were no barriers to learning identified.  Portions of this note were generated with voice recognition software and may contain errors.

## 2024-07-02 ENCOUNTER — Emergency Department

## 2024-07-02 ENCOUNTER — Other Ambulatory Visit: Payer: Self-pay

## 2024-07-02 ENCOUNTER — Encounter: Payer: Self-pay | Admitting: Emergency Medicine

## 2024-07-02 DIAGNOSIS — Z86718 Personal history of other venous thrombosis and embolism: Secondary | ICD-10-CM | POA: Diagnosis not present

## 2024-07-02 DIAGNOSIS — Z7901 Long term (current) use of anticoagulants: Secondary | ICD-10-CM | POA: Diagnosis not present

## 2024-07-02 DIAGNOSIS — I10 Essential (primary) hypertension: Secondary | ICD-10-CM | POA: Insufficient documentation

## 2024-07-02 DIAGNOSIS — E876 Hypokalemia: Secondary | ICD-10-CM | POA: Insufficient documentation

## 2024-07-02 DIAGNOSIS — W19XXXA Unspecified fall, initial encounter: Secondary | ICD-10-CM | POA: Insufficient documentation

## 2024-07-02 DIAGNOSIS — K589 Irritable bowel syndrome without diarrhea: Secondary | ICD-10-CM | POA: Diagnosis not present

## 2024-07-02 DIAGNOSIS — Z950 Presence of cardiac pacemaker: Secondary | ICD-10-CM | POA: Diagnosis not present

## 2024-07-02 DIAGNOSIS — R296 Repeated falls: Principal | ICD-10-CM | POA: Insufficient documentation

## 2024-07-02 DIAGNOSIS — S0990XA Unspecified injury of head, initial encounter: Secondary | ICD-10-CM | POA: Diagnosis not present

## 2024-07-02 DIAGNOSIS — R42 Dizziness and giddiness: Secondary | ICD-10-CM | POA: Insufficient documentation

## 2024-07-02 DIAGNOSIS — K219 Gastro-esophageal reflux disease without esophagitis: Secondary | ICD-10-CM | POA: Insufficient documentation

## 2024-07-02 LAB — CBC WITH DIFFERENTIAL/PLATELET
Abs Immature Granulocytes: 0.02 K/uL (ref 0.00–0.07)
Basophils Absolute: 0 K/uL (ref 0.0–0.1)
Basophils Relative: 0 %
Eosinophils Absolute: 0 K/uL (ref 0.0–0.5)
Eosinophils Relative: 0 %
HCT: 34.7 % — ABNORMAL LOW (ref 36.0–46.0)
Hemoglobin: 11.6 g/dL — ABNORMAL LOW (ref 12.0–15.0)
Immature Granulocytes: 0 %
Lymphocytes Relative: 28 %
Lymphs Abs: 1.9 K/uL (ref 0.7–4.0)
MCH: 26.2 pg (ref 26.0–34.0)
MCHC: 33.4 g/dL (ref 30.0–36.0)
MCV: 78.5 fL — ABNORMAL LOW (ref 80.0–100.0)
Monocytes Absolute: 0.4 K/uL (ref 0.1–1.0)
Monocytes Relative: 7 %
Neutro Abs: 4.3 K/uL (ref 1.7–7.7)
Neutrophils Relative %: 65 %
Platelets: 154 K/uL (ref 150–400)
RBC: 4.42 MIL/uL (ref 3.87–5.11)
RDW: 14.3 % (ref 11.5–15.5)
WBC: 6.7 K/uL (ref 4.0–10.5)
nRBC: 0 % (ref 0.0–0.2)

## 2024-07-02 NOTE — ED Triage Notes (Signed)
 Pt presents to the ED via [facility] with complaints of constipation and lower abdominal pain - hx of same. Endorses small Bms daily but still has intermittent cramping. Pt also had a fall last Thursday which she wasn't evaluated for. She notes walking on my knees and lost caught tripped on the carpet. Denies hitting her head - takes Eliquis . A&Ox4 at this time. Denies CP or SOB.

## 2024-07-03 ENCOUNTER — Other Ambulatory Visit: Payer: Self-pay

## 2024-07-03 ENCOUNTER — Observation Stay
Admission: EM | Admit: 2024-07-03 | Discharge: 2024-07-04 | Disposition: A | Discharge status: Home / Self Care | Attending: Internal Medicine | Admitting: Internal Medicine

## 2024-07-03 DIAGNOSIS — R42 Dizziness and giddiness: Principal | ICD-10-CM

## 2024-07-03 DIAGNOSIS — S0990XA Unspecified injury of head, initial encounter: Secondary | ICD-10-CM

## 2024-07-03 DIAGNOSIS — R296 Repeated falls: Secondary | ICD-10-CM | POA: Diagnosis not present

## 2024-07-03 DIAGNOSIS — K219 Gastro-esophageal reflux disease without esophagitis: Secondary | ICD-10-CM | POA: Diagnosis present

## 2024-07-03 DIAGNOSIS — K582 Mixed irritable bowel syndrome: Secondary | ICD-10-CM

## 2024-07-03 DIAGNOSIS — E876 Hypokalemia: Secondary | ICD-10-CM | POA: Diagnosis not present

## 2024-07-03 DIAGNOSIS — I10 Essential (primary) hypertension: Secondary | ICD-10-CM | POA: Diagnosis present

## 2024-07-03 DIAGNOSIS — K589 Irritable bowel syndrome without diarrhea: Secondary | ICD-10-CM | POA: Diagnosis present

## 2024-07-03 DIAGNOSIS — Z86718 Personal history of other venous thrombosis and embolism: Secondary | ICD-10-CM

## 2024-07-03 LAB — COMPREHENSIVE METABOLIC PANEL WITH GFR
ALT: 15 U/L (ref 0–44)
AST: 19 U/L (ref 15–41)
Albumin: 3.7 g/dL (ref 3.5–5.0)
Alkaline Phosphatase: 85 U/L (ref 38–126)
Anion gap: 14 (ref 5–15)
BUN: 19 mg/dL (ref 6–20)
CO2: 25 mmol/L (ref 22–32)
Calcium: 9.8 mg/dL (ref 8.9–10.3)
Chloride: 103 mmol/L (ref 98–111)
Creatinine, Ser: 0.94 mg/dL (ref 0.44–1.00)
GFR, Estimated: >60 mL/min (ref >60)
Glucose, Bld: 117 mg/dL — ABNORMAL HIGH (ref 70–99)
Potassium: 3.1 mmol/L — ABNORMAL LOW (ref 3.5–5.1)
Sodium: 142 mmol/L (ref 135–145)
Total Bilirubin: 0.5 mg/dL (ref 0.0–1.2)
Total Protein: 6.3 g/dL — ABNORMAL LOW (ref 6.5–8.1)

## 2024-07-03 LAB — BASIC METABOLIC PANEL WITH GFR
Anion gap: 12 (ref 5–15)
BUN: 17 mg/dL (ref 6–20)
CO2: 25 mmol/L (ref 22–32)
Calcium: 9.2 mg/dL (ref 8.9–10.3)
Chloride: 103 mmol/L (ref 98–111)
Creatinine, Ser: 0.83 mg/dL (ref 0.44–1.00)
GFR, Estimated: >60 mL/min (ref >60)
Glucose, Bld: 114 mg/dL — ABNORMAL HIGH (ref 70–99)
Potassium: 2.7 mmol/L — CL (ref 3.5–5.1)
Sodium: 140 mmol/L (ref 135–145)

## 2024-07-03 LAB — MAGNESIUM
Magnesium: 1.3 mg/dL — ABNORMAL LOW (ref 1.7–2.4)
Magnesium: 3.5 mg/dL — ABNORMAL HIGH (ref 1.7–2.4)

## 2024-07-03 LAB — URINALYSIS, ROUTINE W REFLEX MICROSCOPIC
Bilirubin Urine: NEGATIVE
Glucose, UA: NEGATIVE mg/dL
Hgb urine dipstick: NEGATIVE
Ketones, ur: NEGATIVE mg/dL
Leukocytes,Ua: NEGATIVE
Nitrite: NEGATIVE
Protein, ur: NEGATIVE mg/dL
Specific Gravity, Urine: 1.011 (ref 1.005–1.030)
pH: 7 (ref 5.0–8.0)

## 2024-07-03 LAB — CBC
HCT: 35.3 % — ABNORMAL LOW (ref 36.0–46.0)
Hemoglobin: 11.4 g/dL — ABNORMAL LOW (ref 12.0–15.0)
MCH: 25.8 pg — ABNORMAL LOW (ref 26.0–34.0)
MCHC: 32.3 g/dL (ref 30.0–36.0)
MCV: 79.9 fL — ABNORMAL LOW (ref 80.0–100.0)
Platelets: 154 K/uL (ref 150–400)
RBC: 4.42 MIL/uL (ref 3.87–5.11)
RDW: 14.1 % (ref 11.5–15.5)
WBC: 5.1 K/uL (ref 4.0–10.5)
nRBC: 0 % (ref 0.0–0.2)

## 2024-07-03 MED ORDER — MAG GLYCINATE 100 MG PO TABS: 1.0000 | ORAL_TABLET | Freq: Every day | ORAL | Status: DC

## 2024-07-03 MED ORDER — POTASSIUM CHLORIDE 20 MEQ PO PACK
40.0000 meq | PACK | Freq: Once | ORAL | Status: DC
  Filled 2024-07-03: qty 2

## 2024-07-03 MED ORDER — SODIUM CHLORIDE 0.9 % IV BOLUS (SEPSIS)
1000.0000 mL | Freq: Once | INTRAVENOUS | Status: AC
  Administered 2024-07-03: 1000 mL via INTRAVENOUS

## 2024-07-03 MED ORDER — POTASSIUM CHLORIDE CRYS ER 20 MEQ PO TBCR
40.0000 meq | EXTENDED_RELEASE_TABLET | Freq: Once | ORAL | Status: AC
  Administered 2024-07-03: 40 meq via ORAL
  Filled 2024-07-03: qty 2

## 2024-07-03 MED ORDER — DAPAGLIFLOZIN PROPANEDIOL 10 MG PO TABS
10.0000 mg | ORAL_TABLET | Freq: Every day | ORAL | Status: DC
  Filled 2024-07-03 (×2): qty 1

## 2024-07-03 MED ORDER — TRAZODONE HCL 50 MG PO TABS: 25.0000 mg | ORAL_TABLET | Freq: Every evening | ORAL | Status: DC | PRN

## 2024-07-03 MED ORDER — ACETAMINOPHEN 325 MG PO TABS
650.0000 mg | ORAL_TABLET | Freq: Four times a day (QID) | ORAL | Status: DC | PRN
  Filled 2024-07-03: qty 2

## 2024-07-03 MED ORDER — MONTELUKAST SODIUM 10 MG PO TABS: 10.0000 mg | ORAL_TABLET | Freq: Every day | ORAL | Status: DC | PRN

## 2024-07-03 MED ORDER — SUCRALFATE 1 G PO TABS
1.0000 g | ORAL_TABLET | Freq: Four times a day (QID) | ORAL | Status: DC
  Administered 2024-07-03 (×4): 1 g via ORAL
  Filled 2024-07-03 (×5): qty 1

## 2024-07-03 MED ORDER — VITAMIN D 25 MCG (1000 UNIT) PO TABS
2000.0000 [IU] | ORAL_TABLET | Freq: Every day | ORAL | Status: DC
  Administered 2024-07-03: 2000 [IU] via ORAL
  Filled 2024-07-03 (×2): qty 2

## 2024-07-03 MED ORDER — POTASSIUM CHLORIDE CRYS ER 20 MEQ PO TBCR
40.0000 meq | EXTENDED_RELEASE_TABLET | Freq: Once | ORAL | Status: DC
  Filled 2024-07-03: qty 2

## 2024-07-03 MED ORDER — ONDANSETRON HCL 4 MG PO TABS: 4.0000 mg | ORAL_TABLET | Freq: Four times a day (QID) | ORAL | Status: DC | PRN

## 2024-07-03 MED ORDER — POTASSIUM CHLORIDE CRYS ER 20 MEQ PO TBCR
20.0000 meq | EXTENDED_RELEASE_TABLET | Freq: Every day | ORAL | Status: DC
  Administered 2024-07-03: 20 meq via ORAL
  Filled 2024-07-03 (×2): qty 1

## 2024-07-03 MED ORDER — VITAMIN B-12 1000 MCG PO TABS
1000.0000 ug | ORAL_TABLET | Freq: Every day | ORAL | Status: DC
  Administered 2024-07-03: 1000 ug via ORAL
  Filled 2024-07-03 (×2): qty 1

## 2024-07-03 MED ORDER — BISACODYL 10 MG RE SUPP
10.0000 mg | Freq: Every day | RECTAL | Status: DC | PRN
  Administered 2024-07-03: 10 mg via RECTAL
  Filled 2024-07-03: qty 1

## 2024-07-03 MED ORDER — MAGNESIUM SULFATE 2 GM/50ML IV SOLN
2.0000 g | Freq: Once | INTRAVENOUS | Status: AC
  Administered 2024-07-03: 2 g via INTRAVENOUS
  Filled 2024-07-03: qty 50

## 2024-07-03 MED ORDER — METHYLPHENIDATE HCL 10 MG PO TABS
20.0000 mg | ORAL_TABLET | Freq: Three times a day (TID) | ORAL | Status: DC
  Administered 2024-07-03 – 2024-07-04 (×5): 20 mg via ORAL
  Filled 2024-07-03 (×5): qty 2

## 2024-07-03 MED ORDER — PREGABALIN 75 MG PO CAPS
150.0000 mg | ORAL_CAPSULE | Freq: Three times a day (TID) | ORAL | Status: DC
  Administered 2024-07-03 – 2024-07-04 (×2): 150 mg via ORAL
  Filled 2024-07-03 (×4): qty 2

## 2024-07-03 MED ORDER — POTASSIUM CHLORIDE 20 MEQ PO PACK: 40.0000 meq | PACK | Freq: Once | ORAL | Status: DC

## 2024-07-03 MED ORDER — PANTOPRAZOLE SODIUM 40 MG PO TBEC: 40.0000 mg | DELAYED_RELEASE_TABLET | Freq: Every day | ORAL | Status: DC | PRN

## 2024-07-03 MED ORDER — POTASSIUM CHLORIDE IN NACL 20-0.9 MEQ/L-% IV SOLN
INTRAVENOUS | Status: AC
  Filled 2024-07-03 (×3): qty 1000

## 2024-07-03 MED ORDER — MAGNESIUM HYDROXIDE 400 MG/5ML PO SUSP: 30.0000 mL | Freq: Every day | ORAL | Status: DC | PRN

## 2024-07-03 MED ORDER — TRAMADOL HCL 50 MG PO TABS
50.0000 mg | ORAL_TABLET | Freq: Two times a day (BID) | ORAL | Status: DC | PRN
  Filled 2024-07-03: qty 1

## 2024-07-03 MED ORDER — ONDANSETRON HCL 4 MG/2ML IJ SOLN
4.0000 mg | Freq: Four times a day (QID) | INTRAMUSCULAR | Status: DC | PRN
  Filled 2024-07-03: qty 2

## 2024-07-03 MED ORDER — LINACLOTIDE 290 MCG PO CAPS: 290.0000 ug | ORAL_CAPSULE | Freq: Every day | ORAL | Status: DC

## 2024-07-03 MED ORDER — APIXABAN 5 MG PO TABS
5.0000 mg | ORAL_TABLET | Freq: Two times a day (BID) | ORAL | Status: DC
  Administered 2024-07-03 – 2024-07-04 (×3): 5 mg via ORAL
  Filled 2024-07-03 (×3): qty 1

## 2024-07-03 MED ORDER — ACETAMINOPHEN 650 MG RE SUPP: 650.0000 mg | Freq: Four times a day (QID) | RECTAL | Status: DC | PRN

## 2024-07-03 NOTE — Assessment & Plan Note (Signed)
-   Will continue her Tenaoanor and Linzess

## 2024-07-03 NOTE — Assessment & Plan Note (Signed)
-   Potassium be replaced. - This could be contributing to her weakness and falls.

## 2024-07-03 NOTE — Progress Notes (Signed)
 [facility]  - [place] [facility]   PATIENT NAME: Dellamae Rosamilia    MR#:  [id number]  DATE OF BIRTH:  02/17/1981  SUBJECTIVE:  no family at bedside. At times hard to understand patient due to her neurologic disability. Came in with some abdominal pain. She has chronic issues with electrolyte abnormality. Potassium was 2.7 magnesium  1.3 she received some IV magnesium . Denies per RN to take IV potassium    VITALS:  Blood pressure 124/75, pulse 85, temperature 98.4 F (36.9 C), temperature source Oral, resp. rate 18, height 5' 2 (1.575 m), weight 41.6 kg, last menstrual period 06/09/2012, SpO2 98%.  PHYSICAL EXAMINATION:   GENERAL:  43 y.o.-year-old patient with no acute distress. Thin, frail LUNGS: Normal breath sounds bilaterally, no wheezing CARDIOVASCULAR: S1, S2 normal. No murmur   ABDOMEN: Soft, nontender, nondistended. Bowel sounds present.  EXTREMITIES: No  edema b/l.    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 RADIOLOGY:  DG Hip Unilat W or Wo Pelvis 2-3 Views Right Result Date: 07/02/2024 CLINICAL DATA:  Pain after fall. EXAM: DG HIP (WITH OR WITHOUT PELVIS) 2-3V RIGHT COMPARISON:  Radiograph 04/10/2024 FINDINGS: No evidence of acute fracture of the pelvis or right hip. Pubic rami are intact. Pubic symphysis and sacroiliac joints are congruent. 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 Large volume of stool in the included abdomen and pelvis. IMPRESSION: 1. No acute fracture of the pelvis or  right hip. 2. 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 Electronically Signed   By: Andrea Gasman [name]   On: 07/02/2024 21:28   CT Head Wo Contrast Result Date: 07/02/2024 CLINICAL DATA:  Trauma midline tenderness EXAM: CT HEAD WITHOUT CONTRAST CT CERVICAL SPINE WITHOUT CONTRAST TECHNIQUE: Multidetector CT imaging of the head and cervical spine was performed following the standard protocol without intravenous contrast. Multiplanar CT image reconstructions of the cervical spine were also generated. RADIATION DOSE REDUCTION: This exam was performed according to the departmental dose-optimization program which includes automated exposure control, adjustment of the mA and/or kV according to patient size and/or use of iterative reconstruction technique. COMPARISON:  CT 05/07/2024, 11/10/2023 FINDINGS: CT HEAD FINDINGS Brain: No acute territorial infarction, hemorrhage or intracranial mass. The ventricles are stable in size. Stable remote possible lacunar infarcts in the basal ganglia. Minimal nonspecific white matter hypodensity in the subcortical right frontal lobe, without change. Vascular: No hyperdense vessels.  No unexpected calcification Skull: Normal. Negative for fracture or focal lesion. Sinuses/Orbits: No acute finding. Other: None CT CERVICAL SPINE FINDINGS Alignment: Mild dextrocurvature. Stable sagittal alignment with trace retrolisthesis C5 on C6. Skull base and vertebrae: Vertebral body heights are maintained. 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 Linear lucency with sclerotic margin bilaterally at the pedicles of C6, sagittal series 6, image 38 and 26, suspected to be due to remote injury.  Soft tissues and spinal canal: No prevertebral fluid or swelling. No visible canal hematoma. Disc levels: Advanced disc space narrowing C5-C6 with foraminal narrowing. Multilevel posterior disc osteophyte complex. Mild canal stenosis at C5-C6. Upper chest: Lung apices are clear.  Mild apical  scarring Other: None IMPRESSION: 1. No CT evidence for acute intracranial abnormality. 2. Chronic dens fracture stable in alignment. No definite acute osseous abnormality. Suspect remote fracture at the pedicles of C6. 3. Degenerative changes of the cervical spine most notable at C5-C6. Electronically Signed   By: Luke Bun [name]   On: 07/02/2024 21:11   CT Cervical Spine Wo Contrast Result Date: 07/02/2024 CLINICAL DATA:  Trauma midline tenderness EXAM: CT HEAD WITHOUT CONTRAST CT CERVICAL SPINE WITHOUT CONTRAST TECHNIQUE: Multidetector CT imaging of the head and cervical spine was performed following the standard protocol without intravenous contrast. Multiplanar CT image reconstructions of the cervical spine were also generated. RADIATION DOSE REDUCTION: This exam was performed according to the departmental dose-optimization program which includes automated exposure control, adjustment of the mA and/or kV according to patient size and/or use of iterative reconstruction technique. COMPARISON:  CT 05/07/2024, 11/10/2023 FINDINGS: CT HEAD FINDINGS Brain: No acute territorial infarction, hemorrhage or intracranial mass. The ventricles are stable in size. Stable remote possible lacunar infarcts in the basal ganglia. Minimal nonspecific white matter hypodensity in the subcortical right frontal lobe, without change. Vascular: No hyperdense vessels.  No unexpected calcification Skull: Normal. Negative for fracture or focal lesion. Sinuses/Orbits: No acute finding. Other: None CT CERVICAL SPINE FINDINGS Alignment: Mild dextrocurvature. Stable sagittal alignment with trace retrolisthesis C5 on C6. Skull base and vertebrae: Vertebral body heights are maintained. 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 Linear lucency with sclerotic margin bilaterally at the pedicles of C6, sagittal series 6, image 38 and 26, suspected to be due to remote injury. Soft tissues and spinal canal: No prevertebral fluid or  swelling. No visible canal hematoma. Disc levels: Advanced disc space narrowing C5-C6 with foraminal narrowing. Multilevel posterior disc osteophyte complex. Mild canal stenosis at C5-C6. Upper chest: Lung apices are clear.  Mild apical scarring Other: None IMPRESSION: 1. No CT evidence for acute intracranial abnormality. 2. Chronic dens fracture stable in alignment. No definite acute osseous abnormality. Suspect remote fracture at the pedicles of C6. 3. Degenerative changes of the cervical spine most notable at C5-C6. Electronically Signed   By: Luke Bun [name]   On: 07/02/2024 21:11    Assessment and Plan  ZYLIE MUMAW is a 43 y.o. female with medical history significant for cerebral palsy, POTS, RSD, DVT on Eliquis , SA node dysfunction s/p pacemaker placement, essential hypertension, who presented to emergency room with acute onset of lightheadedness, frequent falls and constipation.  She states she feels the same way whenever she is dehydrated and her magnesium  is low.   Acute on chronic electrolyte abnormality with low potassium and low -- replete IV and oral.-- Patient has been refusing IV potassium replacement. She intermittently diff refuses taking oral potassium as well. Explained to her the need for it. PRN Zofran  given. -- Magnesium  3.5  History of DVT -- continue eliquis   Thalia -- continue PPI  History of pacemaker placement due to SA node dysfunction   Procedures: Family communication : none today Consults : CODE STATUS: full DVT Prophylaxis : eliquis  Level of care: Telemetry Medical Status is: Observation The patient remains OBS appropriate and will d/c before 2 midnights.    TOTAL TIME TAKING CARE OF THIS PATIENT: 35 minutes.  >  50% time spent on counselling and coordination of care  Note: This dictation was prepared with Dragon dictation along with smaller phrase technology. Any transcriptional errors that result from this process are unintentional.  Leita Blanch  M.D    [facility]   CC: Primary care physician; Eliverto Bette Hover, MD

## 2024-07-03 NOTE — Assessment & Plan Note (Signed)
-   BP is fairly controlled.

## 2024-07-03 NOTE — Plan of Care (Signed)

## 2024-07-03 NOTE — Assessment & Plan Note (Signed)
-   Will continue Carafate  and PPI therapy.

## 2024-07-03 NOTE — TOC Initial Note (Signed)
 Transition of Care [facility]) - Initial/Assessment Note    Patient Details  Name: [name] MRN: [id number] Date of Birth: 31-Dec-1980  [facility]) CM/SW Contact:    [name], RN Phone Number: 07/03/2024, 4:36 PM  Clinical Narrative:                    Per MD anticipated dc tomorrow Patients dc transportation requires 24 hours notice.  Bedside RN has notified patient to arrange transport for tomorrow      Patient Goals and CMS Choice            Expected Discharge Plan and Services                                              Prior Living Arrangements/Services                       Activities of Daily Living   ADL Screening (condition at time of admission) Independently performs ADLs?: No Does the patient have a NEW difficulty with bathing/dressing/toileting/self-feeding that is expected to last >3 days?: No Does the patient have a NEW difficulty with getting in/out of bed, walking, or climbing stairs that is expected to last >3 days?: No Does the patient have a NEW difficulty with communication that is expected to last >3 days?: No Is the patient deaf or have difficulty hearing?: No Does the patient have difficulty seeing, even when wearing glasses/contacts?: No Does the patient have difficulty concentrating, remembering, or making decisions?: No  Permission Sought/Granted                  Emotional Assessment              Admission diagnosis:  Hypokalemia [E87.6] Hypomagnesemia [E83.42] Lightheadedness [R42] Multiple falls [R29.6] Recurrent falls [R29.6] Injury of head, initial encounter [S09.90XA] Patient Active Problem List   Diagnosis Date Noted   Recurrent falls 07/03/2024   History of DVT (deep vein thrombosis) 07/03/2024   Low back pain 05/07/2024   Fall at home, initial encounter 04/10/2024   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   Electrolyte abnormality 03/13/2024   SA node dysfunction (HCC) 03/13/2024    Hypotension 11/10/2023   Ground-level fall 11/10/2023   Peripheral neuropathy 10/17/2023   Chest wall pain 10/17/2023   Electrolyte disturbance 09/19/2023   Palpitation 09/19/2023   Thrombocytopenia (HCC) 09/19/2023   Protein-calorie malnutrition, moderate (HCC) 09/19/2023   DVT (deep venous thrombosis) (HCC) 09/19/2023   Grade I hemorrhoids 12/13/2022   Constipation 12/11/2022   GI bleeding 12/10/2022   Essential hypertension 12/10/2022   IBS (irritable bowel syndrome) 12/10/2022   GERD without esophagitis 12/10/2022   Hypokalemia 12/10/2022   Rectal bleeding 12/10/2022   Influenza 10/25/2022   Bladder spasms 10/15/2022   Cholecystitis, chronic 01/17/2018   Conductive hearing loss of right ear with unrestricted hearing of left ear 03/21/2017   Right ovarian cyst 05/31/2015   Abdominal pain, chronic, right lower quadrant 05/23/2015   Hypomagnesemia 03/02/2014   Back pain 03/02/2014   Sinus bradycardia 05/31/2013   Cerebral palsy, unspecified (HCC) 06/27/2012   PCP:  Eliverto Bette Hover, MD Pharmacy:   [facility] [id number] [place], [place] - [address] AT [facility] OF [address] [place] [address] Phone: [phone] Fax: [phone]  [facility] [address] [place] [address] Phone: [phone] Fax: [phone]  [facility] [address] [place] [address] Phone: [phone] Fax: [phone]     Social Drivers of Health (SDOH) Social History: SDOH Screenings   Food Insecurity: No Food Insecurity (07/03/2024)  Housing: Unknown (07/03/2024)  Recent Concern: Housing - High Risk (06/13/2024)   Received from [facility]  Transportation Needs: No Transportation Needs (07/03/2024)  Recent Concern: Transportation Needs - Unmet Transportation Needs (06/13/2024)   Received from [facility]  Utilities: Not At  Risk (07/03/2024)  Financial Resource Strain: Low Risk  (06/13/2024)   Received from [facility]  Social Connections: Socially Isolated (05/07/2024)  Stress: Stress Concern Present (07/20/2022)   Received from [facility]  Tobacco Use: Low Risk  (07/02/2024)   SDOH Interventions:     Readmission Risk Interventions     No data to display

## 2024-07-03 NOTE — ED Provider Notes (Signed)
 [facility] Provider Note    Event Date/Time   First MD Initiated Contact with Patient 07/03/24 0142     (approximate)   History   Constipation and Fall   HPI  LATEISHA THURLOW is a 43 y.o. female with history of spastic cerebral palsy, Gitelman syndrome, chronic hypokalemia and hypomagnesemia, POTS status post pacemaker placement who presents to the emergency department with multiple falls.  Reports she did hit her head but did not lose consciousness.  She is on Eliquis .  She reports she feels dehydrated and thinks her electrolytes are abnormal.  No vomiting.  States she is constipated and needs a smog enema but would like to see how she does after IV fluids and IV magnesium .  She feels like she needs to be admitted to the hospital for this.   History provided by patient.    Past Medical History:  Diagnosis Date   Carpal tunnel syndrome    Cerebral palsy (HCC)    Chronic back pain    Gitelman syndrome    Hypokalemia    now resolved   Magnesium  deficiency    Pacemaker    PONV (postoperative nausea and vomiting)    Postural orthostatic tachycardia syndrome    s/p pacemaker placement.   POTS (postural orthostatic tachycardia syndrome)    RSD (reflex sympathetic dystrophy)    right foot,    RSD (reflex sympathetic dystrophy)    SA node dysfunction (HCC) 12/13/2006    Past Surgical History:  Procedure Laterality Date   ABDOMINAL HYSTERECTOMY Left 12/14/2011   Dr. Arloa   CHOLECYSTECTOMY     COLONOSCOPY WITH PROPOFOL  N/A 12/11/2022   Procedure: COLONOSCOPY WITH PROPOFOL ;  Surgeon: Unk Corinn Skiff, MD;  Location: [facility] ENDOSCOPY;  Service: Gastroenterology;  Laterality: N/A;   COLONOSCOPY WITH PROPOFOL  N/A 12/13/2022   Procedure: COLONOSCOPY WITH PROPOFOL ;  Surgeon: Unk Corinn Skiff, MD;  Location: [facility] ENDOSCOPY;  Service: Gastroenterology;  Laterality: N/A;   INSERT / REPLACE / REMOVE PACEMAKER     ORTHOPEDIC SURGERY     PACEMAKER  INSERTION     PORTACATH PLACEMENT      MEDICATIONS:  Prior to Admission medications   Medication Sig Start Date End Date Taking? Authorizing Provider  acetaminophen  (TYLENOL ) 325 MG tablet Take 2 tablets (650 mg total) by mouth every 6 (six) hours as needed for mild pain (pain score 1-3) or fever. 09/28/23   Von Bellis, MD  bisacodyl  (DULCOLAX) 10 MG suppository Place 1 suppository (10 mg total) rectally daily as needed for severe constipation. 04/16/24   Von Bellis, MD  bisacodyl  (DULCOLAX) 5 MG EC tablet Take 1-2 tablets (5-10 mg total) by mouth at bedtime. 04/16/24 04/16/25  Von Bellis, MD  Cholecalciferol  (D2000 ULTRA STRENGTH) 50 MCG (2000 UT) CAPS Take 1 tablet by mouth daily.    [provider]  cyanocobalamin  (VITAMIN B12) 1000 MCG tablet Take 1,000 mcg by mouth daily.    [provider]  dapagliflozin  propanediol (FARXIGA ) 10 MG TABS tablet Take 1 tablet by mouth daily. 12/30/23 12/29/24  [provider]  ELIQUIS  5 MG TABS tablet Take 5 mg by mouth 2 (two) times daily. 08/12/22   [provider]  heparin  lock flush 100 UNIT/ML SOLN injection Inject 5 mL (500 Units total) as directed as needed for line care. Flush using SASH (saline, administer  IV, saline, heparin ) method as directed. Flush IV catheter with heparin  after the last saline flush.  Use syringe ONE TIME only then discard. Storage:  Room Temperature. 08/30/23 08/30/24  [provider]  LINZESS  290 MCG CAPS capsule Take 290 mcg by mouth daily. 10/26/22   [provider]  Magnesium  Bisglycinate (MAG GLYCINATE) 100 MG TABS Take 1 tablet by mouth daily. 05/09/24   Alexander, Natalie, DO  methocarbamol  (ROBAXIN ) 750 MG tablet Take 750 mg by mouth at bedtime as needed. 04/24/24   [provider]  methylphenidate  (RITALIN ) 20 MG tablet Take 20 mg by mouth 3 (three) times daily. Take 1 tablet (20 mg) TID at 0800, 1200 & 2100-2200    [provider]  montelukast   (SINGULAIR ) 10 MG tablet Take 10 mg by mouth daily as needed (allergies). 10/26/22   [provider]  pantoprazole  (PROTONIX ) 40 MG tablet Take 40 mg by mouth daily as needed. 01/26/24   [provider]  potassium chloride  SA (KLOR-CON  M) 20 MEQ tablet Take 20 mEq by mouth 2 (two) times daily as needed.    [provider]  pregabalin  (LYRICA ) 150 MG capsule Take 150 mg by mouth in the morning, at noon, and at bedtime. 04/24/24   [provider]  PREMARIN vaginal cream Place 0.5 g vaginally 2 (two) times a week. 11/08/22   [provider]  promethazine  (PHENERGAN ) 25 MG tablet Take 12.5 mg by mouth every 6 (six) hours as needed for nausea or vomiting. 04/05/24   [provider]  sucralfate  (CARAFATE ) 1 g tablet Take 1 g by mouth 4 (four) times daily.    [provider]  Tenapanor HCl 50 MG TABS Take 50 mg by mouth 2 (two) times daily. 04/11/24   [provider]  traMADol  (ULTRAM ) 50 MG tablet Take 1 tablet by mouth every 12 (twelve) hours as needed. 04/24/24   [provider]    Physical Exam   Triage Vital Signs: ED Triage Vitals  Encounter Vitals Group     BP 07/02/24 2036 139/78     Girls Systolic BP Percentile --      Girls Diastolic BP Percentile --      Boys Systolic BP Percentile --      Boys Diastolic BP Percentile --      Pulse Rate 07/02/24 2036 85     Resp 07/02/24 2036 18     Temp 07/02/24 2036 98.7 F (37.1 C)     Temp src --      SpO2 07/02/24 2036 100 %     Weight 07/02/24 2035 91 lb 11.4 oz (41.6 kg)     Height 07/02/24 2035 5' 2 (1.575 m)     Head Circumference --      Peak Flow --      Pain Score 07/02/24 2037 7     Pain Loc --      Pain Education --      Exclude from Growth Chart --     Most recent vital signs: Vitals:   07/03/24 0629 07/03/24 0629  BP: 125/75 124/75  Pulse: 85 85  Resp: 18 18  Temp: 98.4 F (36.9 C)   SpO2:  98%     CONSTITUTIONAL: Alert, responds  appropriately to questions. Well-appearing; well-nourished; GCS 15 HEAD: Normocephalic; atraumatic EYES: Conjunctivae clear, PERRL, EOMI ENT: normal nose; no rhinorrhea; moist mucous membranes; pharynx without lesions noted; no dental injury; no septal hematoma, no epistaxis; no facial deformity or bony tenderness NECK: Supple, no midline spinal tenderness, step-off or deformity; trachea midline CARD: RRR; S1 and S2 appreciated; no murmurs, no clicks, no rubs, no gallops RESP:  Normal chest excursion without splinting or tachypnea; breath sounds clear and equal bilaterally; no wheezes, no rhonchi, no rales; no hypoxia or respiratory distress CHEST:  chest wall stable, no crepitus or ecchymosis or deformity, nontender to palpation; no flail chest ABD/GI: Non-distended; soft, non-tender, no rebound, no guarding; no ecchymosis or other lesions noted PELVIS:  stable, nontender to palpation BACK:  The back appears normal; no midline spinal tenderness, step-off or deformity EXT: Normal ROM in all joints; no edema; normal capillary refill; no cyanosis, no bony tenderness or bony deformity of patient's extremities, no joint effusions, compartments are soft, extremities are warm and well-perfused, no ecchymosis SKIN: Normal color for age and race; warm NEURO: No facial asymmetry, spastic cerebral palsy  ED Results / Procedures / Treatments   LABS: (all labs ordered are listed, but only abnormal results are displayed) Labs Reviewed  CBC WITH DIFFERENTIAL/PLATELET - Abnormal; Notable for the following components:      Result Value   Hemoglobin 11.6 (*)    HCT 34.7 (*)    MCV 78.5 (*)    All other components within normal limits  COMPREHENSIVE METABOLIC PANEL WITH GFR - Abnormal; Notable for the following components:   Potassium 3.1 (*)    Glucose, Bld 117 (*)    Total Protein 6.3 (*)    All other components within normal limits  MAGNESIUM  - Abnormal; Notable for the following components:    Magnesium  1.3 (*)    All other components within normal limits  URINALYSIS, ROUTINE W REFLEX MICROSCOPIC - Abnormal; Notable for the following components:   Color, Urine STRAW (*)    APPearance CLEAR (*)    All other components within normal limits  BASIC METABOLIC PANEL WITH GFR - Abnormal; Notable for the following components:   Potassium 2.7 (*)    Glucose, Bld 114 (*)    All other components within normal limits  CBC - Abnormal; Notable for the following components:   Hemoglobin 11.4 (*)    HCT 35.3 (*)    MCV 79.9 (*)    MCH 25.8 (*)    All other components within normal limits     EKG:    Date: 07/03/2024  Rate: 85  Rhythm: normal sinus rhythm  QRS Axis: normal  Intervals: normal  ST/T Wave abnormalities: normal  Conduction Disutrbances: none  Narrative Interpretation: unremarkable       RADIOLOGY: My personal review and interpretation of imaging: CT of the head and cervical spine show no acute traumatic injury.  I have personally reviewed all radiology reports. DG Hip Unilat W or Wo Pelvis 2-3 Views Right Result Date: 07/02/2024 CLINICAL DATA:  Pain after fall. EXAM: DG HIP (WITH OR WITHOUT PELVIS) 2-3V RIGHT COMPARISON:  Radiograph 04/10/2024 FINDINGS: No evidence of acute fracture of the pelvis or right hip. Pubic rami are intact. Pubic symphysis and sacroiliac joints are congruent. 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 Large volume of stool in the included abdomen and pelvis. IMPRESSION: 1. No acute fracture of the pelvis or right hip. 2. 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 Electronically Signed   By: Andrea Gasman [name]   On: 07/02/2024 21:28   CT Head Wo Contrast Result Date: 07/02/2024 CLINICAL DATA:  Trauma midline tenderness EXAM: CT HEAD WITHOUT CONTRAST CT CERVICAL SPINE WITHOUT CONTRAST TECHNIQUE: Multidetector CT imaging of the head and cervical spine was performed following the standard protocol without intravenous contrast.  Multiplanar CT image reconstructions of the cervical spine were also generated.  RADIATION DOSE REDUCTION: This exam was performed according to the departmental dose-optimization program which includes automated exposure control, adjustment of the mA and/or kV according to patient size and/or use of iterative reconstruction technique. COMPARISON:  CT 05/07/2024, 11/10/2023 FINDINGS: CT HEAD FINDINGS Brain: No acute territorial infarction, hemorrhage or intracranial mass. The ventricles are stable in size. Stable remote possible lacunar infarcts in the basal ganglia. Minimal nonspecific white matter hypodensity in the subcortical right frontal lobe, without change. Vascular: No hyperdense vessels.  No unexpected calcification Skull: Normal. Negative for fracture or focal lesion. Sinuses/Orbits: No acute finding. Other: None CT CERVICAL SPINE FINDINGS Alignment: Mild dextrocurvature. Stable sagittal alignment with trace retrolisthesis C5 on C6. Skull base and vertebrae: Vertebral body heights are maintained. 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 Linear lucency with sclerotic margin bilaterally at the pedicles of C6, sagittal series 6, image 38 and 26, suspected to be due to remote injury. Soft tissues and spinal canal: No prevertebral fluid or swelling. No visible canal hematoma. Disc levels: Advanced disc space narrowing C5-C6 with foraminal narrowing. Multilevel posterior disc osteophyte complex. Mild canal stenosis at C5-C6. Upper chest: Lung apices are clear.  Mild apical scarring Other: None IMPRESSION: 1. No CT evidence for acute intracranial abnormality. 2. Chronic dens fracture stable in alignment. No definite acute osseous abnormality. Suspect remote fracture at the pedicles of C6. 3. Degenerative changes of the cervical spine most notable at C5-C6. Electronically Signed   By: Luke Bun [name]   On: 07/02/2024 21:11   CT Cervical Spine Wo Contrast Result Date: 07/02/2024 CLINICAL DATA:   Trauma midline tenderness EXAM: CT HEAD WITHOUT CONTRAST CT CERVICAL SPINE WITHOUT CONTRAST TECHNIQUE: Multidetector CT imaging of the head and cervical spine was performed following the standard protocol without intravenous contrast. Multiplanar CT image reconstructions of the cervical spine were also generated. RADIATION DOSE REDUCTION: This exam was performed according to the departmental dose-optimization program which includes automated exposure control, adjustment of the mA and/or kV according to patient size and/or use of iterative reconstruction technique. COMPARISON:  CT 05/07/2024, 11/10/2023 FINDINGS: CT HEAD FINDINGS Brain: No acute territorial infarction, hemorrhage or intracranial mass. The ventricles are stable in size. Stable remote possible lacunar infarcts in the basal ganglia. Minimal nonspecific white matter hypodensity in the subcortical right frontal lobe, without change. Vascular: No hyperdense vessels.  No unexpected calcification Skull: Normal. Negative for fracture or focal lesion. Sinuses/Orbits: No acute finding. Other: None CT CERVICAL SPINE FINDINGS Alignment: Mild dextrocurvature. Stable sagittal alignment with trace retrolisthesis C5 on C6. Skull base and vertebrae: Vertebral body heights are maintained. 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 Linear lucency with sclerotic margin bilaterally at the pedicles of C6, sagittal series 6, image 38 and 26, suspected to be due to remote injury. Soft tissues and spinal canal: No prevertebral fluid or swelling. No visible canal hematoma. Disc levels: Advanced disc space narrowing C5-C6 with foraminal narrowing. Multilevel posterior disc osteophyte complex. Mild canal stenosis at C5-C6. Upper chest: Lung apices are clear.  Mild apical scarring Other: None IMPRESSION: 1. No CT evidence for acute intracranial abnormality. 2. Chronic dens fracture stable in alignment. No definite acute osseous abnormality. Suspect remote fracture  at the pedicles of C6. 3. Degenerative changes of the cervical spine most notable at C5-C6. Electronically Signed   By: Luke Bun [name]   On: 07/02/2024 21:11     PROCEDURES:  Critical Care performed: Yes, see critical care procedure note(s)   CRITICAL CARE Performed by: Josette Sink  Total critical care time: 30 minutes  Critical care time was exclusive of separately billable procedures and treating other patients.  Critical care was necessary to treat or prevent imminent or life-threatening deterioration.  Critical care was time spent personally by me on the following activities: development of treatment plan with patient and/or surrogate as well as nursing, discussions with consultants, evaluation of patient's response to treatment, examination of patient, obtaining history from patient or surrogate, ordering and performing treatments and interventions, ordering and review of laboratory studies, ordering and review of radiographic studies, pulse oximetry and re-evaluation of patient's condition.   SABRA1-3 Lead EKG Interpretation  Performed by: Tysheena Ginzburg, Josette SAILOR, DO Authorized by: Youlanda Tomassetti, Josette SAILOR, DO     Interpretation: normal     ECG rate:  85   ECG rate assessment: normal     Rhythm: sinus rhythm     Ectopy: none     Conduction: normal       IMPRESSION / MDM / ASSESSMENT AND PLAN / ED COURSE  I reviewed the triage vital signs and the nursing notes.  Patient here with lightheadedness, frequent falls.  She is concerned that her magnesium  level is low.  She is also having constipation.  The patient is on the cardiac monitor to evaluate for evidence of arrhythmia and/or significant heart rate changes.   DIFFERENTIAL DIAGNOSIS (includes but not limited to):   Hypomagnesemia, hypokalemia, dehydration, UTI, anemia, intracranial hemorrhage, skull fracture, cervical spine fracture, chronic lightheadedness/POTS, constipation, doubt bowel obstruction  Patient's presentation is  most consistent with acute presentation with potential threat to life or bodily function.  PLAN: Patient's potassium is 3.1.  Will give oral replacement.  Magnesium  is 1.3.  Will give IV replacement.  Normal intervals on EKG.  Urine does not appear infected.  Abdominal exam benign.  CT scan of her head, cervical spine and x-rays of her hips were reviewed and interpreted by myself and the radiologist and are unremarkable for traumatic injury.  She declines anything for pain.  No indication that she needs emergent abdominal imaging.  She states that normally a smog enema helps her constipation but she would like to hold off on this at this time and see if she improves with IV fluids and IV magnesium .  History of chronic constipation, chronic narcotic use.  Will discuss with hospitalist for admission for observation for hydration, electrolyte replenishment.   MEDICATIONS GIVEN IN ED: Medications  traMADol  (ULTRAM ) tablet 50 mg (has no administration in time range)  methylphenidate  (RITALIN ) tablet 20 mg (has no administration in time range)  dapagliflozin  propanediol (FARXIGA ) tablet 10 mg (has no administration in time range)  bisacodyl  (DULCOLAX) suppository 10 mg (has no administration in time range)  pantoprazole  (PROTONIX ) EC tablet 40 mg (has no administration in time range)  sucralfate  (CARAFATE ) tablet 1 g (has no administration in time range)  cyanocobalamin  (VITAMIN B12) tablet 1,000 mcg (has no administration in time range)  apixaban  (ELIQUIS ) tablet 5 mg (has no administration in time range)  pregabalin  (LYRICA ) capsule 150 mg (has no administration in time range)  cholecalciferol  (VITAMIN D3) 25 MCG (1000 UNIT) tablet 2,000 Units (has no administration in time range)  potassium chloride  SA (KLOR-CON  M) CR tablet 20 mEq (has no administration in time range)  montelukast  (SINGULAIR ) tablet 10 mg (has no administration in time range)  acetaminophen  (TYLENOL ) tablet 650 mg (has no  administration in time range)    Or  acetaminophen  (TYLENOL ) suppository 650 mg (has no administration in time range)  traZODone  (DESYREL ) tablet 25 mg (has no administration in time range)  magnesium  hydroxide (MILK OF MAGNESIA) suspension 30 mL (has no administration in time range)  ondansetron  (ZOFRAN ) tablet 4 mg (has no administration in time range)    Or  ondansetron  (ZOFRAN ) injection 4 mg (has no administration in time range)  0.9 % NaCl with KCl 20 mEq/ L  infusion ( Intravenous New Bag/Given 07/03/24 0440)  potassium chloride  (KLOR-CON ) packet 40 mEq (has no administration in time range)  potassium chloride  (KLOR-CON ) packet 40 mEq (has no administration in time range)  magnesium  sulfate IVPB 2 g 50 mL (0 g Intravenous Stopped 07/03/24 0327)  sodium chloride  0.9 % bolus 1,000 mL (0 mLs Intravenous Stopped 07/03/24 0439)  magnesium  sulfate IVPB 2 g 50 mL (0 g Intravenous Stopped 07/03/24 0439)     ED COURSE:  Consulted and discussed patient's case with hospitalist, Dr. Lawence.  I have recommended admission and consulting physician agrees and will place admission orders.  Patient (and family if present) agree with this plan.   I reviewed all nursing notes, vitals, pertinent previous records.  All labs, EKGs, imaging ordered have been independently reviewed and interpreted by myself.      OUTSIDE RECORDS REVIEWED: Reviewed last admission.       FINAL CLINICAL IMPRESSION(S) / ED DIAGNOSES   Final diagnoses:  Lightheadedness  Multiple falls  Injury of head, initial encounter  Hypokalemia  Hypomagnesemia     Rx / DC Orders   ED Discharge Orders     None        Note:  This document was prepared using Dragon voice recognition software and may include unintentional dictation errors.   Willoughby Doell, Josette SAILOR, DO 07/03/24 [phone]

## 2024-07-03 NOTE — Assessment & Plan Note (Signed)
Will continue Eliquis. 

## 2024-07-03 NOTE — H&P (Addendum)
 [place]   PATIENT NAME: [name]    MR#:  [id number]  DATE OF BIRTH:  February 13, 1981  DATE OF ADMISSION:  07/03/2024  PRIMARY CARE PHYSICIAN: Eliverto Bette Hover, MD   Patient is coming from: Home  REQUESTING/REFERRING PHYSICIAN: Ward, Josette SAILOR, DO  CHIEF COMPLAINT:   Chief Complaint  Patient presents with   Constipation   Fall    HISTORY OF PRESENT ILLNESS:  [name] is a 43 y.o. female with medical history significant for cerebral palsy, POTS, RSD, DVT on Eliquis , SA node dysfunction s/p pacemaker placement, essential hypertension, who presented to emergency room with acute onset of lightheadedness, frequent falls and constipation.  She states she feels the same way whenever she is dehydrated and her magnesium  is low.  No nausea or vomiting or abdominal pain.  No fever or chills.  No diarrhea or melena or bright red bleeding per rectum.  No dysuria, oliguria or hematuria or flank pain.  No cough or wheezing or dyspnea.  No chest pain or palpitations.  ED Course: When she came to the ER, vital signs were within normal.  Labs revealed hypokalemia 3.1 and magnesium  1.3 with troponin of 6.3.  CBC showed mild anemia with hemoglobin 11.6 and hematocrit 34.7 with microcytosis.  UA was unremarkable. EKG as reviewed by me : EKG showed sinus rhythm with a rate of 85 with short PR interval and borderline intraventricular conduction delay. Imaging: Noncontrast head CT scan revealed no acute intracranial normalities.  It showed chronic dens fracture that is stable in alignment with no acute osseous abnormality.  Should remote fracture at the pedicles of C6 and degenerative changes of the cervical spine most notable at C5-C6.  The patient was given 1 L bolus of IV normal saline, 2 g of IV magnesium  sulfate and I repeated the dose.  She will be admitted to a medical telemetry observation bed for further evaluation and management. PAST MEDICAL HISTORY:   Past Medical History:   Diagnosis Date   Carpal tunnel syndrome    Cerebral palsy (HCC)    Chronic back pain    Gitelman syndrome    Hypokalemia    now resolved   Magnesium  deficiency    Pacemaker    PONV (postoperative nausea and vomiting)    Postural orthostatic tachycardia syndrome    s/p pacemaker placement.   POTS (postural orthostatic tachycardia syndrome)    RSD (reflex sympathetic dystrophy)    right foot,    RSD (reflex sympathetic dystrophy)    SA node dysfunction (HCC) 12/13/2006    PAST SURGICAL HISTORY:   Past Surgical History:  Procedure Laterality Date   ABDOMINAL HYSTERECTOMY Left 12/14/2011   Dr. Arloa   CHOLECYSTECTOMY     COLONOSCOPY WITH PROPOFOL  N/A 12/11/2022   Procedure: COLONOSCOPY WITH PROPOFOL ;  Surgeon: Unk Corinn Skiff, MD;  Location: [facility] ENDOSCOPY;  Service: Gastroenterology;  Laterality: N/A;   COLONOSCOPY WITH PROPOFOL  N/A 12/13/2022   Procedure: COLONOSCOPY WITH PROPOFOL ;  Surgeon: Unk Corinn Skiff, MD;  Location: [facility] ENDOSCOPY;  Service: Gastroenterology;  Laterality: N/A;   INSERT / REPLACE / REMOVE PACEMAKER     ORTHOPEDIC SURGERY     PACEMAKER INSERTION     PORTACATH PLACEMENT      SOCIAL HISTORY:   Social History   Tobacco Use   Smoking status: Never   Smokeless tobacco: Never  Substance Use Topics   Alcohol use: No    Alcohol/week: 0.0 standard drinks of alcohol  FAMILY HISTORY:   Family History  Problem Relation Age of Onset   Diabetes Mother    Hypertension Mother     DRUG ALLERGIES:   Allergies  Allergen Reactions   Duloxetine     Other reaction(s): Other (See Comments), Palpitations Other reaction(s): Hallucinations, Hyperactive behavior (finding), Other (See Comments) Stressed out     Flecainide     Other reaction(s): Other (See Comments) Other reaction(s): Hypomagnesemia (disorder), Other (See Comments) Other Reaction: QUESTION HYPOMAGNESEMIA     Fluoxetine     Other reaction(s): Nausea And Vomiting, Other (See  Comments) Other reaction(s): Other (See Comments) Other Reaction: INCREASED HR  causes irritability and irrational behavior I go crazy   Gabapentin     Goes crazy Other reaction(s): Other (See Comments) Other reaction(s): Hyperactive behavior (finding), Mental Status Changes (intolerance), Other (See Comments) Goes crazy Hallucinations   Baclofen     Other reaction(s): Other (See Comments), Other (See Comments) Weakness  Other reaction(s): Other (See Comments) Weakness and loose Weakness  Other reaction(s):  Weakness     Cephalosporins     Other reaction(s): Hives, Rash Received pre-op vancomycin and cefazolin 07/20/16. Developed urticarial rash on bilateral lower extremities + slight periorbital swelling. Treated with diphenhydramine . Received pre-op vancomycin and cefazolin 07/20/16. Developed urticarial rash on bilateral lower extremities + slight periorbital swelling. Treated with diphenhydramine . Received pre-op vancomycin and cefazolin 07/20/16. Developed urticarial rash on bilateral lower extremities + slight periorbital swelling. Treated with diphenhydramine .   Sulfasalazine     Other reaction(s): Nausea And Vomiting   Sulfa Antibiotics Nausea And Vomiting   Vancomycin     Other reaction(s): Hives Received pre-op vancomycin and cefazolin 07/20/16. Developed urticarial rash on bilateral lower extremities + slight periorbital swelling. Treated with diphenhydramine . Received pre-op vancomycin and cefazolin 07/20/16. Developed urticarial rash on bilateral lower extremities + slight periorbital swelling. Treated with diphenhydramine . Received pre-op vancomycin and cefazolin 07/20/16. Developed urticarial rash on bilateral lower extremities + slight periorbital swelling. Treated with diphenhydramine .   Cefazolin Rash    Tolerated ceftriaxone  2023 and cephalexin  2022    REVIEW OF SYSTEMS:   ROS As per history of present illness. All pertinent systems were reviewed above.  Constitutional, HEENT, cardiovascular, respiratory, GI, GU, musculoskeletal, neuro, psychiatric, endocrine, integumentary and hematologic systems were reviewed and are otherwise negative/unremarkable except for positive findings mentioned above in the HPI.   MEDICATIONS AT HOME:   Prior to Admission medications   Medication Sig Start Date End Date Taking? Authorizing Provider  metoCLOPramide  (REGLAN ) 5 MG tablet Take 5 mg by mouth as directed. 05/06/24  Yes [provider]  promethazine  (PHENERGAN ) 25 MG tablet Take 12.5 mg by mouth every 8 (eight) hours as needed. 06/14/24  Yes [provider]  acetaminophen  (TYLENOL ) 325 MG tablet Take 2 tablets (650 mg total) by mouth every 6 (six) hours as needed for mild pain (pain score 1-3) or fever. 09/28/23   Von Bellis, MD  bisacodyl  (DULCOLAX) 10 MG suppository Place 1 suppository (10 mg total) rectally daily as needed for severe constipation. 04/16/24   Von Bellis, MD  bisacodyl  (DULCOLAX) 5 MG EC tablet Take 1-2 tablets (5-10 mg total) by mouth at bedtime. 04/16/24 04/16/25  Von Bellis, MD  Cholecalciferol  (D2000 ULTRA STRENGTH) 50 MCG (2000 UT) CAPS Take 1 tablet by mouth daily.    [provider]  cyanocobalamin  (VITAMIN B12) 1000 MCG tablet Take 1,000 mcg by mouth daily.    [provider]  dapagliflozin  propanediol (FARXIGA ) 10 MG TABS tablet Take  1 tablet by mouth daily. 12/30/23 12/29/24  [provider]  ELIQUIS  5 MG TABS tablet Take 5 mg by mouth 2 (two) times daily. 08/12/22   [provider]  heparin  lock flush 100 UNIT/ML SOLN injection Inject 5 mL (500 Units total) as directed as needed for line care. Flush using SASH (saline, administer  IV, saline, heparin ) method as directed. Flush IV catheter with heparin  after the last saline flush.  Use syringe ONE TIME only then discard. Storage: Room Temperature. 08/30/23 08/30/24  [provider]  lactulose , encephalopathy, (CHRONULAC ) 10  GM/15ML SOLN Take 20 g by mouth daily.    [provider]  LINZESS  290 MCG CAPS capsule Take 290 mcg by mouth daily. 10/26/22   [provider]  Magnesium  Bisglycinate (MAG GLYCINATE) 100 MG TABS Take 1 tablet by mouth daily. 05/09/24   Alexander, Natalie, DO  methocarbamol  (ROBAXIN ) 750 MG tablet Take 750 mg by mouth at bedtime as needed. 04/24/24   [provider]  methylphenidate  (RITALIN ) 20 MG tablet Take 20 mg by mouth 3 (three) times daily. Take 1 tablet (20 mg) TID at 0800, 1200 & 2100-2200    [provider]  montelukast  (SINGULAIR ) 10 MG tablet Take 10 mg by mouth daily as needed (allergies). 10/26/22   [provider]  pantoprazole  (PROTONIX ) 40 MG tablet Take 40 mg by mouth daily as needed. 01/26/24   [provider]  potassium chloride  SA (KLOR-CON  M) 20 MEQ tablet Take 20 mEq by mouth 2 (two) times daily as needed.    [provider]  pregabalin  (LYRICA ) 150 MG capsule Take 150 mg by mouth in the morning, at noon, and at bedtime. 04/24/24   [provider]  PREMARIN vaginal cream Place 0.5 g vaginally 2 (two) times a week. 11/08/22   [provider]  promethazine  (PHENERGAN ) 25 MG tablet Take 12.5 mg by mouth every 6 (six) hours as needed for nausea or vomiting. 04/05/24   [provider]  sucralfate  (CARAFATE ) 1 g tablet Take 1 g by mouth 4 (four) times daily.    [provider]  Tenapanor HCl 50 MG TABS Take 50 mg by mouth 2 (two) times daily. 04/11/24   [provider]  traMADol  (ULTRAM ) 50 MG tablet Take 1 tablet by mouth every 12 (twelve) hours as needed. 04/24/24   [provider]      VITAL SIGNS:  Blood pressure 124/75, pulse 85, temperature 98.4 F (36.9 C), resp. rate 18, height 5' 2 (1.575 m), weight 41.6 kg, last menstrual period 06/09/2012, SpO2 98%.  PHYSICAL EXAMINATION:  Physical Exam  GENERAL:  43 y.o.-year-old patient lying in the bed with no acute  distress.  EYES: Pupils equal, round, reactive to light and accommodation. No scleral icterus. Extraocular muscles intact.  HEENT: Head atraumatic, normocephalic. Oropharynx and nasopharynx clear.  NECK:  Supple, no jugular venous distention. No thyroid  enlargement, no tenderness.  LUNGS: Normal breath sounds bilaterally, no wheezing, rales,rhonchi or crepitation. No use of accessory muscles of respiration.  CARDIOVASCULAR: Regular rate and rhythm, S1, S2 normal. No murmurs, rubs, or gallops.  ABDOMEN: Soft, nondistended, nontender. Bowel sounds present. No organomegaly or mass.  EXTREMITIES: No pedal edema, cyanosis, or clubbing.  NEUROLOGIC: Cranial nerves II through XII are intact. Muscle strength 5/5 in all extremities. Sensation intact. Gait not checked.  PSYCHIATRIC: The patient is alert and oriented x 3.  Normal affect and good eye contact. SKIN: No obvious rash, lesion, or ulcer.   LABORATORY PANEL:  CBC Recent Labs  Lab 07/03/24 0440  WBC 5.1  HGB 11.4*  HCT 35.3*  PLT 154   ------------------------------------------------------------------------------------------------------------------  Chemistries  Recent Labs  Lab 07/02/24 2351 07/03/24 0440  NA 142 140  K 3.1* 2.7*  CL 103 103  CO2 25 25  GLUCOSE 117* 114*  BUN 19 17  CREATININE 0.94 0.83  CALCIUM  9.8 9.2  MG 1.3*  --   AST 19  --   ALT 15  --   ALKPHOS 85  --   BILITOT 0.5  --    ------------------------------------------------------------------------------------------------------------------  Cardiac Enzymes No results for input(s): TROPONINI in the last 168 hours. ------------------------------------------------------------------------------------------------------------------  RADIOLOGY:  DG Hip Unilat W or Wo Pelvis 2-3 Views Right Result Date: 07/02/2024 CLINICAL DATA:  Pain after fall. EXAM: DG HIP (WITH OR WITHOUT PELVIS) 2-3V RIGHT COMPARISON:  Radiograph 04/10/2024 FINDINGS: No evidence  of acute fracture of the pelvis or right hip. Pubic rami are intact. Pubic symphysis and sacroiliac joints are congruent. 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 Large volume of stool in the included abdomen and pelvis. IMPRESSION: 1. No acute fracture of the pelvis or right hip. 2. 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 Electronically Signed   By: Andrea Gasman [name]   On: 07/02/2024 21:28   CT Head Wo Contrast Result Date: 07/02/2024 CLINICAL DATA:  Trauma midline tenderness EXAM: CT HEAD WITHOUT CONTRAST CT CERVICAL SPINE WITHOUT CONTRAST TECHNIQUE: Multidetector CT imaging of the head and cervical spine was performed following the standard protocol without intravenous contrast. Multiplanar CT image reconstructions of the cervical spine were also generated. RADIATION DOSE REDUCTION: This exam was performed according to the departmental dose-optimization program which includes automated exposure control, adjustment of the mA and/or kV according to patient size and/or use of iterative reconstruction technique. COMPARISON:  CT 05/07/2024, 11/10/2023 FINDINGS: CT HEAD FINDINGS Brain: No acute territorial infarction, hemorrhage or intracranial mass. The ventricles are stable in size. Stable remote possible lacunar infarcts in the basal ganglia. Minimal nonspecific white matter hypodensity in the subcortical right frontal lobe, without change. Vascular: No hyperdense vessels.  No unexpected calcification Skull: Normal. Negative for fracture or focal lesion. Sinuses/Orbits: No acute finding. Other: None CT CERVICAL SPINE FINDINGS Alignment: Mild dextrocurvature. Stable sagittal alignment with trace retrolisthesis C5 on C6. Skull base and vertebrae: Vertebral body heights are maintained. 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 Linear lucency with sclerotic margin bilaterally at the pedicles of C6, sagittal series 6, image 38 and 26, suspected to be due to remote injury. Soft tissues and  spinal canal: No prevertebral fluid or swelling. No visible canal hematoma. Disc levels: Advanced disc space narrowing C5-C6 with foraminal narrowing. Multilevel posterior disc osteophyte complex. Mild canal stenosis at C5-C6. Upper chest: Lung apices are clear.  Mild apical scarring Other: None IMPRESSION: 1. No CT evidence for acute intracranial abnormality. 2. Chronic dens fracture stable in alignment. No definite acute osseous abnormality. Suspect remote fracture at the pedicles of C6. 3. Degenerative changes of the cervical spine most notable at C5-C6. Electronically Signed   By: Luke Bun [name]   On: 07/02/2024 21:11   CT Cervical Spine Wo Contrast Result Date: 07/02/2024 CLINICAL DATA:  Trauma midline tenderness EXAM: CT HEAD WITHOUT CONTRAST CT CERVICAL SPINE WITHOUT CONTRAST TECHNIQUE: Multidetector CT imaging of the head and cervical spine was performed following the standard protocol without intravenous contrast. Multiplanar CT image reconstructions of the cervical spine were also generated. RADIATION DOSE REDUCTION: This exam was  performed according to the departmental dose-optimization program which includes automated exposure control, adjustment of the mA and/or kV according to patient size and/or use of iterative reconstruction technique. COMPARISON:  CT 05/07/2024, 11/10/2023 FINDINGS: CT HEAD FINDINGS Brain: No acute territorial infarction, hemorrhage or intracranial mass. The ventricles are stable in size. Stable remote possible lacunar infarcts in the basal ganglia. Minimal nonspecific white matter hypodensity in the subcortical right frontal lobe, without change. Vascular: No hyperdense vessels.  No unexpected calcification Skull: Normal. Negative for fracture or focal lesion. Sinuses/Orbits: No acute finding. Other: None CT CERVICAL SPINE FINDINGS Alignment: Mild dextrocurvature. Stable sagittal alignment with trace retrolisthesis C5 on C6. Skull base and vertebrae: Vertebral body heights  are maintained. 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 Linear lucency with sclerotic margin bilaterally at the pedicles of C6, sagittal series 6, image 38 and 26, suspected to be due to remote injury. Soft tissues and spinal canal: No prevertebral fluid or swelling. No visible canal hematoma. Disc levels: Advanced disc space narrowing C5-C6 with foraminal narrowing. Multilevel posterior disc osteophyte complex. Mild canal stenosis at C5-C6. Upper chest: Lung apices are clear.  Mild apical scarring Other: None IMPRESSION: 1. No CT evidence for acute intracranial abnormality. 2. Chronic dens fracture stable in alignment. No definite acute osseous abnormality. Suspect remote fracture at the pedicles of C6. 3. Degenerative changes of the cervical spine most notable at C5-C6. Electronically Signed   By: Luke Bun [name]   On: 07/02/2024 21:11      IMPRESSION AND PLAN:  Assessment and Plan: * Recurrent falls - The patient be admitted to a medical telemetry observation bed. - She will be placed on fall precautions. - PT consult to be obtained given her recurrent falls.  IBS (irritable bowel syndrome) - Will continue her Tenaoanor and Linzess   Hypokalemia - Potassium be replaced. - This could be contributing to her weakness and falls.  Hypomagnesemia Will replace magnesium .  Essential hypertension - BP is fairly controlled.  GERD without esophagitis - Will continue Carafate  and PPI therapy.  History of DVT (deep vein thrombosis) - Will continue Eliquis .    DVT prophylaxis: Eliquis . Advanced Care Planning:  Code Status: full code. Family Communication:  The plan of care was discussed in details with the patient (and family). I answered all questions. The patient agreed to proceed with the above mentioned plan. Further management will depend upon hospital course. Disposition Plan: Back to previous home environment Consults called: none. All the records are reviewed  and case discussed with ED provider.  Status is: Observation  I certify that at the time of admission, it is my clinical judgment that the patient will require  hospital care extending less than 2 midnights.                            Dispo: The patient is from: Home              Anticipated d/c is to: Home              Patient currently is not medically stable to d/c.              Difficult to place patient: No  Madison DELENA Peaches M.D on 07/03/2024 at 6:47 AM  [facility]   From 7 PM-7 AM, contact night-coverage [email or web address]  CC: Primary care physician; Eliverto Bette Hover, MD

## 2024-07-03 NOTE — Assessment & Plan Note (Addendum)
-   The patient be admitted to a medical telemetry observation bed. - She will be placed on fall precautions. - PT consult to be obtained given her recurrent falls.

## 2024-07-03 NOTE — Assessment & Plan Note (Signed)
 Will replace magnesium .

## 2024-07-03 NOTE — Care Management Obs Status (Signed)
 MEDICARE OBSERVATION STATUS NOTIFICATION   Patient Details  Name: [name] MRN: [id number] Date of Birth: [birth year]   Medicare Observation Status Notification Given:  No (patient did not want a copy)    [name] 07/03/2024, 3:38 PM

## 2024-07-04 DIAGNOSIS — R296 Repeated falls: Secondary | ICD-10-CM | POA: Diagnosis not present

## 2024-07-04 LAB — BASIC METABOLIC PANEL WITH GFR
Anion gap: 5 (ref 5–15)
BUN: 12 mg/dL (ref 6–20)
CO2: 23 mmol/L (ref 22–32)
Calcium: 9.1 mg/dL (ref 8.9–10.3)
Chloride: 110 mmol/L (ref 98–111)
Creatinine, Ser: 0.66 mg/dL (ref 0.44–1.00)
GFR, Estimated: >60 mL/min (ref >60)
Glucose, Bld: 102 mg/dL — ABNORMAL HIGH (ref 70–99)
Potassium: 3.7 mmol/L (ref 3.5–5.1)
Sodium: 138 mmol/L (ref 135–145)

## 2024-07-04 LAB — MAGNESIUM: Magnesium: 1.5 mg/dL — ABNORMAL LOW (ref 1.7–2.4)

## 2024-07-04 MED ORDER — MAG-OXIDE 200 MG PO TABS: 400.0000 mg | ORAL_TABLET | Freq: Every day | ORAL | 0 refills | Status: DC

## 2024-07-04 MED ORDER — MAGNESIUM SULFATE 4 GM/100ML IV SOLN
4.0000 g | Freq: Once | INTRAVENOUS | Status: DC
  Filled 2024-07-04: qty 100

## 2024-07-04 MED ORDER — POTASSIUM CHLORIDE CRYS ER 20 MEQ PO TBCR: 20.0000 meq | EXTENDED_RELEASE_TABLET | Freq: Two times a day (BID) | ORAL | 0 refills | Status: DC

## 2024-07-04 NOTE — TOC CM/SW Note (Signed)
 Met with patient at bedside.  She states that she has arranged private pay transport through [employer] to pick her up today at 130 She states that her mother will be at home when she arrives  She states that she has IV supplies at home, and her nephrologist will order more fluid when needed. She states that she has a private pay nurse who assists her with her home infusions

## 2024-07-04 NOTE — Plan of Care (Signed)
 PT D/C to home, patient set up own transport. Pt A&OX4. BP (!) 125/92 (BP Location: Left Arm)   Pulse 87   Temp 98.4 F (36.9 C) (Oral)   Resp 18   Ht 5' 2 (1.575 m)   Wt 41.6 kg   LMP 06/09/2012   SpO2 100%   BMI 16.77 kg/m   Reviewed AVS, follow up questions answered. No other needs voiced at this. [name] 07/04/24 1:00 PM

## 2024-07-04 NOTE — Discharge Summary (Signed)
 Physician Discharge Summary   Patient: [name] MRN: [id number] DOB: [birth year]  Admit date:     07/03/2024  Discharge date: 07/04/24  Discharge Physician: Leita Blanch   PCP: Eliverto Bette Hover, MD   Recommendations at discharge:    F/u PCP in 1-2 weeks Encouraged pt to take her Potassium and mag pills at home  Discharge Diagnoses: Principal Problem:   Recurrent falls Active Problems:   IBS (irritable bowel syndrome)   Hypokalemia   Hypomagnesemia   Essential hypertension   GERD without esophagitis   History of DVT (deep vein thrombosis)   [name] is a 43 y.o. female with medical history significant for cerebral palsy, POTS, RSD, DVT on Eliquis , SA node dysfunction s/p pacemaker placement, essential hypertension, who presented to emergency room with acute onset of lightheadedness, frequent falls and constipation.  She states she feels the same way whenever she is dehydrated and her magnesium  is low.    Acute on chronic electrolyte abnormality with low potassium and low mag -- replete IV and oral.-- Patient has been refusing IV potassium replacement. She intermittently refuses taking oral potassium as well. Explained to her the need for it. PRN Zofran  given. -- Magnesium  3.5--1.5--will give IV 4 g mag today before discharge --rx for KCL and mag sent to her pharmacy   History of DVT -- continue eliquis    [place]alia -- continue PPI   History of pacemaker placement due to SA node dysfunction   Overall at baseline. Agreeable for d/c to home. Her transport will come at 1 pm   Family communication : none today Consults :none CODE STATUS: full DVT Prophylaxis : eliquis      Pain control - [place]  Controlled Substance Reporting System database was reviewed. and patient was instructed, not to drive, operate heavy machinery, perform activities at heights, swimming or participation in water activities or provide baby-sitting services while on Pain, Sleep and  Anxiety Medications; until their outpatient Physician has advised to do so again. Also recommended to not to take more than prescribed Pain, Sleep and Anxiety Medications.  Disposition: Home DISCHARGE MEDICATION: Allergies as of 07/04/2024       Reactions   Duloxetine    Other reaction(s): Other (See Comments), Palpitations Other reaction(s): Hallucinations, Hyperactive behavior (finding), Other (See Comments) Stressed out   Flecainide    Other reaction(s): Other (See Comments) Other reaction(s): Hypomagnesemia (disorder), Other (See Comments) Other Reaction: QUESTION HYPOMAGNESEMIA   Fluoxetine    Other reaction(s): Nausea And Vomiting, Other (See Comments) Other reaction(s): Other (See Comments) Other Reaction: INCREASED HR  causes irritability and irrational behavior I go crazy   Gabapentin    Goes crazy Other reaction(s): Other (See Comments) Other reaction(s): Hyperactive behavior (finding), Mental Status Changes (intolerance), Other (See Comments) Goes crazy Hallucinations   Baclofen    Other reaction(s): Other (See Comments), Other (See Comments) Weakness  Other reaction(s): Other (See Comments) Weakness and loose Weakness  Other reaction(s):  Weakness    Cephalosporins    Other reaction(s): Hives, Rash Received pre-op vancomycin and cefazolin 07/20/16. Developed urticarial rash on bilateral lower extremities + slight periorbital swelling. Treated with diphenhydramine . Received pre-op vancomycin and cefazolin 07/20/16. Developed urticarial rash on bilateral lower extremities + slight periorbital swelling. Treated with diphenhydramine . Received pre-op vancomycin and cefazolin 07/20/16. Developed urticarial rash on bilateral lower extremities + slight periorbital swelling. Treated with diphenhydramine .   Sulfasalazine    Other reaction(s): Nausea And Vomiting   Sulfa Antibiotics Nausea And Vomiting   Vancomycin  Other reaction(s): Hives Received pre-op vancomycin and  cefazolin 07/20/16. Developed urticarial rash on bilateral lower extremities + slight periorbital swelling. Treated with diphenhydramine . Received pre-op vancomycin and cefazolin 07/20/16. Developed urticarial rash on bilateral lower extremities + slight periorbital swelling. Treated with diphenhydramine . Received pre-op vancomycin and cefazolin 07/20/16. Developed urticarial rash on bilateral lower extremities + slight periorbital swelling. Treated with diphenhydramine .   Cefazolin Rash   Tolerated ceftriaxone  2023 and cephalexin  2022        Medication List     STOP taking these medications    bisacodyl  10 MG suppository Commonly known as: DULCOLAX   bisacodyl  5 MG EC tablet Commonly known as: DULCOLAX   lactulose  (encephalopathy) 10 GM/15ML Soln Commonly known as: CHRONULAC    Linzess  290 MCG Caps capsule Generic drug: linaclotide    Mag Glycinate 100 MG Tabs   Tenapanor HCl 50 MG Tabs       TAKE these medications    acetaminophen  325 MG tablet Commonly known as: TYLENOL  Take 2 tablets (650 mg total) by mouth every 6 (six) hours as needed for mild pain (pain score 1-3) or fever.   cyanocobalamin  1000 MCG tablet Commonly known as: VITAMIN B12 Take 1,000 mcg by mouth daily.   D2000 Ultra Strength 50 MCG (2000 UT) Caps Generic drug: Cholecalciferol  Take 1 tablet by mouth daily.   dapagliflozin  propanediol 10 MG Tabs tablet Commonly known as: FARXIGA  Take 1 tablet by mouth daily.   Eliquis  5 MG Tabs tablet Generic drug: apixaban  Take 5 mg by mouth 2 (two) times daily.   heparin  lock flush 100 UNIT/ML Soln injection Inject 5 mL (500 Units total) as directed as needed for line care. Flush using SASH (saline, administer  IV, saline, heparin ) method as directed. Flush IV catheter with heparin  after the last saline flush.  Use syringe ONE TIME only then discard. Storage: Room Temperature.   Mag-Oxide 200 MG Tabs Generic drug: Magnesium  Oxide -Mg Supplement Take 2 tablets  (400 mg total) by mouth daily.   methocarbamol  750 MG tablet Commonly known as: ROBAXIN  Take 750 mg by mouth at bedtime as needed.   montelukast  10 MG tablet Commonly known as: SINGULAIR  Take 10 mg by mouth daily as needed (allergies).   pantoprazole  40 MG tablet Commonly known as: PROTONIX  Take 40 mg by mouth 2 (two) times daily.   potassium chloride  SA 20 MEQ tablet Commonly known as: KLOR-CON  M Take 1 tablet (20 mEq total) by mouth 2 (two) times daily. What changed:  when to take this reasons to take this   pregabalin  150 MG capsule Commonly known as: LYRICA  Take 150 mg by mouth 2 (two) times daily.   Premarin vaginal cream Generic drug: conjugated estrogens Place 0.5 g vaginally 2 (two) times a week.   promethazine  25 MG tablet Commonly known as: PHENERGAN  Take 12.5 mg by mouth every 8 (eight) hours as needed.   Ritalin  20 MG tablet Generic drug: methylphenidate  Take 20 mg by mouth 3 (three) times daily. Take 1 tablet (20 mg) TID at 0800, 1200 & 2100-2200   sucralfate  1 g tablet Commonly known as: CARAFATE  Take 1 g by mouth 4 (four) times daily.   traMADol  50 MG tablet Commonly known as: ULTRAM  Take 1 tablet by mouth every 12 (twelve) hours as needed.        Follow-up Information     Eliverto Bette Hover, MD. Schedule an appointment as soon as possible for a visit in 1 week(s).   Specialty: Family Medicine Contact information: [phone]  [address] [place] [address] [phone]                Condition at discharge: fair  The results of significant diagnostics from this hospitalization (including imaging, microbiology, ancillary and laboratory) are listed below for reference.   Imaging Studies: DG Hip Unilat W or Wo Pelvis 2-3 Views Right Result Date: 07/02/2024 CLINICAL DATA:  Pain after fall. EXAM: DG HIP (WITH OR WITHOUT PELVIS) 2-3V RIGHT COMPARISON:  Radiograph 04/10/2024 FINDINGS: No evidence of acute fracture of the pelvis or right  hip. Pubic rami are intact. Pubic symphysis and sacroiliac joints are congruent. 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 Large volume of stool in the included abdomen and pelvis. IMPRESSION: 1. No acute fracture of the pelvis or right hip. 2. 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 Electronically Signed   By: Andrea Gasman [name]   On: 07/02/2024 21:28   CT Head Wo Contrast Result Date: 07/02/2024 CLINICAL DATA:  Trauma midline tenderness EXAM: CT HEAD WITHOUT CONTRAST CT CERVICAL SPINE WITHOUT CONTRAST TECHNIQUE: Multidetector CT imaging of the head and cervical spine was performed following the standard protocol without intravenous contrast. Multiplanar CT image reconstructions of the cervical spine were also generated. RADIATION DOSE REDUCTION: This exam was performed according to the departmental dose-optimization program which includes automated exposure control, adjustment of the mA and/or kV according to patient size and/or use of iterative reconstruction technique. COMPARISON:  CT 05/07/2024, 11/10/2023 FINDINGS: CT HEAD FINDINGS Brain: No acute territorial infarction, hemorrhage or intracranial mass. The ventricles are stable in size. Stable remote possible lacunar infarcts in the basal ganglia. Minimal nonspecific white matter hypodensity in the subcortical right frontal lobe, without change. Vascular: No hyperdense vessels.  No unexpected calcification Skull: Normal. Negative for fracture or focal lesion. Sinuses/Orbits: No acute finding. Other: None CT CERVICAL SPINE FINDINGS Alignment: Mild dextrocurvature. Stable sagittal alignment with trace retrolisthesis C5 on C6. Skull base and vertebrae: Vertebral body heights are maintained. 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 Linear lucency with sclerotic margin bilaterally at the pedicles of C6, sagittal series 6, image 38 and 26, suspected to be due to remote injury. Soft tissues and spinal canal: No prevertebral fluid or  swelling. No visible canal hematoma. Disc levels: Advanced disc space narrowing C5-C6 with foraminal narrowing. Multilevel posterior disc osteophyte complex. Mild canal stenosis at C5-C6. Upper chest: Lung apices are clear.  Mild apical scarring Other: None IMPRESSION: 1. No CT evidence for acute intracranial abnormality. 2. Chronic dens fracture stable in alignment. No definite acute osseous abnormality. Suspect remote fracture at the pedicles of C6. 3. Degenerative changes of the cervical spine most notable at C5-C6. Electronically Signed   By: Luke Bun [name]   On: 07/02/2024 21:11   CT Cervical Spine Wo Contrast Result Date: 07/02/2024 CLINICAL DATA:  Trauma midline tenderness EXAM: CT HEAD WITHOUT CONTRAST CT CERVICAL SPINE WITHOUT CONTRAST TECHNIQUE: Multidetector CT imaging of the head and cervical spine was performed following the standard protocol without intravenous contrast. Multiplanar CT image reconstructions of the cervical spine were also generated. RADIATION DOSE REDUCTION: This exam was performed according to the departmental dose-optimization program which includes automated exposure control, adjustment of the mA and/or kV according to patient size and/or use of iterative reconstruction technique. COMPARISON:  CT 05/07/2024, 11/10/2023 FINDINGS: CT HEAD FINDINGS Brain: No acute territorial infarction, hemorrhage or intracranial mass. The ventricles are stable in size. Stable remote possible lacunar infarcts in the basal ganglia. Minimal nonspecific white matter  hypodensity in the subcortical right frontal lobe, without change. Vascular: No hyperdense vessels.  No unexpected calcification Skull: Normal. Negative for fracture or focal lesion. Sinuses/Orbits: No acute finding. Other: None CT CERVICAL SPINE FINDINGS Alignment: Mild dextrocurvature. Stable sagittal alignment with trace retrolisthesis C5 on C6. Skull base and vertebrae: Vertebral body heights are maintained. Chronic ununited  odontoid fracture, stable in alignment. Linear lucency with sclerotic margin bilaterally at the pedicles of C6, sagittal series 6, image 38 and 26, suspected to be due to remote injury. Soft tissues and spinal canal: No prevertebral fluid or swelling. No visible canal hematoma. Disc levels: Advanced disc space narrowing C5-C6 with foraminal narrowing. Multilevel posterior disc osteophyte complex. Mild canal stenosis at C5-C6. Upper chest: Lung apices are clear.  Mild apical scarring Other: None IMPRESSION: 1. No CT evidence for acute intracranial abnormality. 2. Chronic dens fracture stable in alignment. No definite acute osseous abnormality. Suspect remote fracture at the pedicles of C6. 3. Degenerative changes of the cervical spine most notable at C5-C6. Electronically Signed   By: Luke Bun [name]   On: 07/02/2024 21:11    Microbiology: Results for orders placed or performed during the hospital encounter of 04/10/24  Urine Culture (for pregnant, neutropenic or urologic patients or patients with an indwelling urinary catheter)     Status: Abnormal   Collection Time: 04/13/24  2:43 PM   Specimen: Urine, Clean Catch  Result Value Ref Range Status   Specimen Description   Final    URINE, CLEAN CATCH Performed at [facility], [address] [place], [place] [address]    Special Requests   Final    NONE Performed at [facility], [address] [place], [place] [address]    Culture MULTIPLE SPECIES PRESENT, SUGGEST RECOLLECTION (A)  Final   Report Status 04/14/2024 FINAL  Final    Labs: CBC: Recent Labs  Lab 07/02/24 2351 07/03/24 0440  WBC 6.7 5.1  NEUTROABS 4.3  --   HGB 11.6* 11.4*  HCT 34.7* 35.3*  MCV 78.5* 79.9*  PLT 154 154   Basic Metabolic Panel: Recent Labs  Lab 07/02/24 2351 07/03/24 0440 07/04/24 0244  NA 142 140 138  K 3.1* 2.7* 3.7  CL 103 103 110  CO2 25 25 23   GLUCOSE 117* 114* 102*  BUN 19 17 12   CREATININE 0.94 0.83 0.66  CALCIUM   9.8 9.2 9.1  MG 1.3* 3.5* 1.5*   Liver Function Tests: Recent Labs  Lab 07/02/24 2351  AST 19  ALT 15  ALKPHOS 85  BILITOT 0.5  PROT 6.3*  ALBUMIN 3.7   Discharge time spent: greater than 30 minutes.  Signed: Leita Blanch, MD [facility] 07/04/2024

## 2024-08-01 ENCOUNTER — Other Ambulatory Visit: Payer: Self-pay

## 2024-08-01 ENCOUNTER — Emergency Department

## 2024-08-01 DIAGNOSIS — Z833 Family history of diabetes mellitus: Secondary | ICD-10-CM

## 2024-08-01 DIAGNOSIS — Z7901 Long term (current) use of anticoagulants: Secondary | ICD-10-CM | POA: Diagnosis not present

## 2024-08-01 DIAGNOSIS — N3090 Cystitis, unspecified without hematuria: Secondary | ICD-10-CM | POA: Diagnosis not present

## 2024-08-01 DIAGNOSIS — E876 Hypokalemia: Secondary | ICD-10-CM | POA: Diagnosis present

## 2024-08-01 DIAGNOSIS — R471 Dysarthria and anarthria: Secondary | ICD-10-CM | POA: Diagnosis not present

## 2024-08-01 DIAGNOSIS — Z882 Allergy status to sulfonamides status: Secondary | ICD-10-CM

## 2024-08-01 DIAGNOSIS — Z8249 Family history of ischemic heart disease and other diseases of the circulatory system: Secondary | ICD-10-CM

## 2024-08-01 DIAGNOSIS — Z681 Body mass index (BMI) 19 or less, adult: Secondary | ICD-10-CM

## 2024-08-01 DIAGNOSIS — D649 Anemia, unspecified: Secondary | ICD-10-CM | POA: Diagnosis not present

## 2024-08-01 DIAGNOSIS — Z79899 Other long term (current) drug therapy: Secondary | ICD-10-CM

## 2024-08-01 DIAGNOSIS — Z881 Allergy status to other antibiotic agents status: Secondary | ICD-10-CM

## 2024-08-01 DIAGNOSIS — E538 Deficiency of other specified B group vitamins: Secondary | ICD-10-CM | POA: Diagnosis present

## 2024-08-01 DIAGNOSIS — Z888 Allergy status to other drugs, medicaments and biological substances status: Secondary | ICD-10-CM | POA: Diagnosis not present

## 2024-08-01 DIAGNOSIS — W19XXXA Unspecified fall, initial encounter: Secondary | ICD-10-CM | POA: Diagnosis present

## 2024-08-01 DIAGNOSIS — R296 Repeated falls: Secondary | ICD-10-CM | POA: Diagnosis not present

## 2024-08-01 DIAGNOSIS — D696 Thrombocytopenia, unspecified: Secondary | ICD-10-CM | POA: Diagnosis present

## 2024-08-01 DIAGNOSIS — Z5982 Transportation insecurity: Secondary | ICD-10-CM

## 2024-08-01 DIAGNOSIS — Z950 Presence of cardiac pacemaker: Secondary | ICD-10-CM | POA: Diagnosis not present

## 2024-08-01 DIAGNOSIS — G90A Postural orthostatic tachycardia syndrome (POTS): Secondary | ICD-10-CM | POA: Diagnosis present

## 2024-08-01 DIAGNOSIS — K5909 Other constipation: Secondary | ICD-10-CM | POA: Diagnosis not present

## 2024-08-01 DIAGNOSIS — Z90710 Acquired absence of both cervix and uterus: Secondary | ICD-10-CM | POA: Diagnosis not present

## 2024-08-01 DIAGNOSIS — S0990XA Unspecified injury of head, initial encounter: Secondary | ICD-10-CM | POA: Diagnosis not present

## 2024-08-01 DIAGNOSIS — E43 Unspecified severe protein-calorie malnutrition: Secondary | ICD-10-CM | POA: Diagnosis present

## 2024-08-01 DIAGNOSIS — G801 Spastic diplegic cerebral palsy: Secondary | ICD-10-CM | POA: Diagnosis present

## 2024-08-01 LAB — CBC
HCT: 36.9 % (ref 36.0–46.0)
Hemoglobin: 12.2 g/dL (ref 12.0–15.0)
MCH: 26.5 pg (ref 26.0–34.0)
MCHC: 33.1 g/dL (ref 30.0–36.0)
MCV: 80.2 fL (ref 80.0–100.0)
Platelets: 130 K/uL — ABNORMAL LOW (ref 150–400)
RBC: 4.6 MIL/uL (ref 3.87–5.11)
RDW: 14.4 % (ref 11.5–15.5)
WBC: 5.2 K/uL (ref 4.0–10.5)
nRBC: 0 % (ref 0.0–0.2)

## 2024-08-01 LAB — COMPREHENSIVE METABOLIC PANEL WITH GFR
ALT: 15 U/L (ref 0–44)
AST: 19 U/L (ref 15–41)
Albumin: 4.3 g/dL (ref 3.5–5.0)
Alkaline Phosphatase: 82 U/L (ref 38–126)
Anion gap: 12 (ref 5–15)
BUN: 21 mg/dL — ABNORMAL HIGH (ref 6–20)
CO2: 28 mmol/L (ref 22–32)
Calcium: 9.9 mg/dL (ref 8.9–10.3)
Chloride: 103 mmol/L (ref 98–111)
Creatinine, Ser: 0.93 mg/dL (ref 0.44–1.00)
GFR, Estimated: >60 mL/min (ref >60)
Glucose, Bld: 117 mg/dL — ABNORMAL HIGH (ref 70–99)
Potassium: 2.8 mmol/L — ABNORMAL LOW (ref 3.5–5.1)
Sodium: 143 mmol/L (ref 135–145)
Total Bilirubin: 0.7 mg/dL (ref 0.0–1.2)
Total Protein: 7.1 g/dL (ref 6.5–8.1)

## 2024-08-01 LAB — LIPASE, BLOOD: Lipase: 30 U/L (ref 11–51)

## 2024-08-01 NOTE — ED Triage Notes (Signed)
 Patient brought in via [facility] tonight with complaints of abdominal pain and feeling weird when she pees. Patient states she thinks she may have UTI as well as her potassium and magnesium  might be low. Patient also endorses falling multiple times today and complaints of right hip pain.

## 2024-08-01 NOTE — ED Triage Notes (Signed)
 Pt arrives via EMS from home for c/o abd pain and spots in vision; st hx of same with hypomagnesium; no BM x wk

## 2024-08-02 ENCOUNTER — Inpatient Hospital Stay
Admission: EM | Admit: 2024-08-02 | Discharge: 2024-08-04 | DRG: 689 | Disposition: A | Discharge status: Home Health | Attending: Student | Admitting: Student

## 2024-08-02 ENCOUNTER — Inpatient Hospital Stay

## 2024-08-02 ENCOUNTER — Emergency Department

## 2024-08-02 ENCOUNTER — Encounter: Payer: Self-pay | Admitting: Family Medicine

## 2024-08-02 DIAGNOSIS — E876 Hypokalemia: Principal | ICD-10-CM | POA: Diagnosis present

## 2024-08-02 DIAGNOSIS — D649 Anemia, unspecified: Secondary | ICD-10-CM | POA: Diagnosis not present

## 2024-08-02 DIAGNOSIS — Z882 Allergy status to sulfonamides status: Secondary | ICD-10-CM | POA: Diagnosis not present

## 2024-08-02 DIAGNOSIS — Z90710 Acquired absence of both cervix and uterus: Secondary | ICD-10-CM | POA: Diagnosis not present

## 2024-08-02 DIAGNOSIS — D696 Thrombocytopenia, unspecified: Secondary | ICD-10-CM | POA: Diagnosis present

## 2024-08-02 DIAGNOSIS — S0990XA Unspecified injury of head, initial encounter: Secondary | ICD-10-CM

## 2024-08-02 DIAGNOSIS — Z681 Body mass index (BMI) 19 or less, adult: Secondary | ICD-10-CM | POA: Diagnosis not present

## 2024-08-02 DIAGNOSIS — N390 Urinary tract infection, site not specified: Secondary | ICD-10-CM

## 2024-08-02 DIAGNOSIS — Z7901 Long term (current) use of anticoagulants: Secondary | ICD-10-CM | POA: Diagnosis not present

## 2024-08-02 DIAGNOSIS — N158 Other specified renal tubulo-interstitial diseases: Secondary | ICD-10-CM | POA: Diagnosis not present

## 2024-08-02 DIAGNOSIS — R296 Repeated falls: Secondary | ICD-10-CM

## 2024-08-02 DIAGNOSIS — R471 Dysarthria and anarthria: Secondary | ICD-10-CM | POA: Diagnosis present

## 2024-08-02 DIAGNOSIS — G801 Spastic diplegic cerebral palsy: Secondary | ICD-10-CM | POA: Diagnosis present

## 2024-08-02 DIAGNOSIS — E538 Deficiency of other specified B group vitamins: Secondary | ICD-10-CM | POA: Diagnosis present

## 2024-08-02 DIAGNOSIS — Z881 Allergy status to other antibiotic agents status: Secondary | ICD-10-CM | POA: Diagnosis not present

## 2024-08-02 DIAGNOSIS — N3090 Cystitis, unspecified without hematuria: Secondary | ICD-10-CM | POA: Diagnosis present

## 2024-08-02 DIAGNOSIS — Z5982 Transportation insecurity: Secondary | ICD-10-CM | POA: Diagnosis not present

## 2024-08-02 DIAGNOSIS — W19XXXA Unspecified fall, initial encounter: Secondary | ICD-10-CM | POA: Diagnosis present

## 2024-08-02 DIAGNOSIS — Z950 Presence of cardiac pacemaker: Secondary | ICD-10-CM | POA: Diagnosis not present

## 2024-08-02 DIAGNOSIS — Z833 Family history of diabetes mellitus: Secondary | ICD-10-CM | POA: Diagnosis not present

## 2024-08-02 DIAGNOSIS — G90A Postural orthostatic tachycardia syndrome (POTS): Secondary | ICD-10-CM | POA: Diagnosis present

## 2024-08-02 DIAGNOSIS — Z79899 Other long term (current) drug therapy: Secondary | ICD-10-CM | POA: Diagnosis not present

## 2024-08-02 DIAGNOSIS — E43 Unspecified severe protein-calorie malnutrition: Secondary | ICD-10-CM | POA: Diagnosis present

## 2024-08-02 DIAGNOSIS — Z8249 Family history of ischemic heart disease and other diseases of the circulatory system: Secondary | ICD-10-CM | POA: Diagnosis not present

## 2024-08-02 DIAGNOSIS — K5909 Other constipation: Secondary | ICD-10-CM | POA: Diagnosis present

## 2024-08-02 DIAGNOSIS — Z888 Allergy status to other drugs, medicaments and biological substances status: Secondary | ICD-10-CM | POA: Diagnosis not present

## 2024-08-02 LAB — URINALYSIS, ROUTINE W REFLEX MICROSCOPIC
Bilirubin Urine: NEGATIVE
Glucose, UA: NEGATIVE mg/dL
Ketones, ur: 20 mg/dL — AB
Nitrite: NEGATIVE
Protein, ur: NEGATIVE mg/dL
Specific Gravity, Urine: 1.024 (ref 1.005–1.030)
WBC, UA: >50 WBC/hpf (ref 0–5)
pH: 5 (ref 5.0–8.0)

## 2024-08-02 LAB — CBC
HCT: 34.1 % — ABNORMAL LOW (ref 36.0–46.0)
Hemoglobin: 11.1 g/dL — ABNORMAL LOW (ref 12.0–15.0)
MCH: 26.2 pg (ref 26.0–34.0)
MCHC: 32.6 g/dL (ref 30.0–36.0)
MCV: 80.6 fL (ref 80.0–100.0)
Platelets: 115 K/uL — ABNORMAL LOW (ref 150–400)
RBC: 4.23 MIL/uL (ref 3.87–5.11)
RDW: 14.5 % (ref 11.5–15.5)
WBC: 5.3 K/uL (ref 4.0–10.5)
nRBC: 0 % (ref 0.0–0.2)

## 2024-08-02 LAB — BASIC METABOLIC PANEL WITH GFR
Anion gap: 11 (ref 5–15)
BUN: 20 mg/dL (ref 6–20)
CO2: 24 mmol/L (ref 22–32)
Calcium: 9.2 mg/dL (ref 8.9–10.3)
Chloride: 105 mmol/L (ref 98–111)
Creatinine, Ser: 0.87 mg/dL (ref 0.44–1.00)
GFR, Estimated: >60 mL/min (ref >60)
Glucose, Bld: 108 mg/dL — ABNORMAL HIGH (ref 70–99)
Potassium: 3.9 mmol/L (ref 3.5–5.1)
Sodium: 140 mmol/L (ref 135–145)

## 2024-08-02 LAB — MAGNESIUM: Magnesium: 1.3 mg/dL — ABNORMAL LOW (ref 1.7–2.4)

## 2024-08-02 MED ORDER — METHOCARBAMOL 500 MG PO TABS: 750.0000 mg | ORAL_TABLET | Freq: Every evening | ORAL | Status: DC | PRN

## 2024-08-02 MED ORDER — POTASSIUM CHLORIDE CRYS ER 20 MEQ PO TBCR
40.0000 meq | EXTENDED_RELEASE_TABLET | Freq: Once | ORAL | Status: AC
  Administered 2024-08-02: 40 meq via ORAL
  Filled 2024-08-02: qty 2

## 2024-08-02 MED ORDER — QUETIAPINE FUMARATE 25 MG PO TABS: 25.0000 mg | ORAL_TABLET | Freq: Once | ORAL | Status: DC

## 2024-08-02 MED ORDER — APIXABAN 5 MG PO TABS
5.0000 mg | ORAL_TABLET | Freq: Two times a day (BID) | ORAL | Status: DC
  Administered 2024-08-02 – 2024-08-04 (×4): 5 mg via ORAL
  Filled 2024-08-02 (×4): qty 1

## 2024-08-02 MED ORDER — ONDANSETRON HCL 4 MG/2ML IJ SOLN: 4.0000 mg | Freq: Four times a day (QID) | INTRAMUSCULAR | Status: DC | PRN

## 2024-08-02 MED ORDER — METHYLPHENIDATE HCL 10 MG PO TABS
20.0000 mg | ORAL_TABLET | Freq: Three times a day (TID) | ORAL | Status: DC
  Administered 2024-08-02 – 2024-08-04 (×6): 20 mg via ORAL
  Filled 2024-08-02: qty 4
  Filled 2024-08-02 (×2): qty 2
  Filled 2024-08-02 (×2): qty 4
  Filled 2024-08-02: qty 2

## 2024-08-02 MED ORDER — MONTELUKAST SODIUM 10 MG PO TABS: 10.0000 mg | ORAL_TABLET | Freq: Every day | ORAL | Status: DC | PRN

## 2024-08-02 MED ORDER — LACTULOSE 10 GM/15ML PO SOLN
20.0000 g | Freq: Once | ORAL | Status: AC
  Administered 2024-08-02: 20 g via ORAL
  Filled 2024-08-02: qty 30

## 2024-08-02 MED ORDER — POTASSIUM CHLORIDE 10 MEQ/100ML IV SOLN
10.0000 meq | INTRAVENOUS | Status: AC
  Administered 2024-08-02 (×4): 10 meq via INTRAVENOUS
  Filled 2024-08-02 (×2): qty 100

## 2024-08-02 MED ORDER — POTASSIUM CHLORIDE IN NACL 20-0.9 MEQ/L-% IV SOLN: INTRAVENOUS | Status: DC

## 2024-08-02 MED ORDER — MAGNESIUM HYDROXIDE 400 MG/5ML PO SUSP: 30.0000 mL | Freq: Every day | ORAL | Status: DC | PRN

## 2024-08-02 MED ORDER — SUCRALFATE 1 GM/10ML PO SUSP
1.0000 g | Freq: Once | ORAL | Status: AC
  Administered 2024-08-03: 1 g via ORAL
  Filled 2024-08-02: qty 10

## 2024-08-02 MED ORDER — ACETAMINOPHEN 325 MG PO TABS
650.0000 mg | ORAL_TABLET | Freq: Four times a day (QID) | ORAL | Status: DC | PRN
  Filled 2024-08-02: qty 2

## 2024-08-02 MED ORDER — ESTROGENS CONJUGATED 0.625 MG/GM VA CREA
0.5000 g | TOPICAL_CREAM | VAGINAL | Status: DC
  Filled 2024-08-02: qty 30

## 2024-08-02 MED ORDER — PROMETHAZINE HCL 25 MG PO TABS: 12.5000 mg | ORAL_TABLET | Freq: Three times a day (TID) | ORAL | Status: DC | PRN

## 2024-08-02 MED ORDER — ACETAMINOPHEN 650 MG RE SUPP: 650.0000 mg | Freq: Four times a day (QID) | RECTAL | Status: DC | PRN

## 2024-08-02 MED ORDER — MAGNESIUM SULFATE 2 GM/50ML IV SOLN: 2.0000 g | Freq: Once | INTRAVENOUS | Status: DC

## 2024-08-02 MED ORDER — ONDANSETRON HCL 4 MG PO TABS: 4.0000 mg | ORAL_TABLET | Freq: Four times a day (QID) | ORAL | Status: DC | PRN

## 2024-08-02 MED ORDER — POTASSIUM CHLORIDE 20 MEQ PO PACK
40.0000 meq | PACK | Freq: Once | ORAL | Status: DC
  Filled 2024-08-02: qty 2

## 2024-08-02 MED ORDER — POTASSIUM CHLORIDE IN NACL 20-0.9 MEQ/L-% IV SOLN
INTRAVENOUS | Status: DC
  Filled 2024-08-02: qty 1000

## 2024-08-02 MED ORDER — TRAMADOL HCL 50 MG PO TABS: 50.0000 mg | ORAL_TABLET | Freq: Two times a day (BID) | ORAL | Status: DC | PRN

## 2024-08-02 MED ORDER — LEVOFLOXACIN IN D5W 250 MG/50ML IV SOLN
250.0000 mg | Freq: Every day | INTRAVENOUS | Status: DC
  Administered 2024-08-02: 250 mg via INTRAVENOUS
  Filled 2024-08-02 (×2): qty 50

## 2024-08-02 MED ORDER — MAGNESIUM SULFATE 4 GM/100ML IV SOLN
4.0000 g | Freq: Once | INTRAVENOUS | Status: AC
  Administered 2024-08-02: 4 g via INTRAVENOUS
  Filled 2024-08-02: qty 100

## 2024-08-02 MED ORDER — PANTOPRAZOLE SODIUM 40 MG PO TBEC
40.0000 mg | DELAYED_RELEASE_TABLET | Freq: Two times a day (BID) | ORAL | Status: DC
  Administered 2024-08-02 – 2024-08-04 (×4): 40 mg via ORAL
  Filled 2024-08-02 (×4): qty 1

## 2024-08-02 MED ORDER — ENOXAPARIN SODIUM 30 MG/0.3ML IJ SOSY
30.0000 mg | PREFILLED_SYRINGE | INTRAMUSCULAR | Status: DC
  Administered 2024-08-02: 30 mg via SUBCUTANEOUS
  Filled 2024-08-02: qty 0.3

## 2024-08-02 MED ORDER — SUCRALFATE 1 G PO TABS
1.0000 g | ORAL_TABLET | Freq: Once | ORAL | Status: AC
  Administered 2024-08-02: 1 g via ORAL
  Filled 2024-08-02: qty 1

## 2024-08-02 MED ORDER — TRAZODONE HCL 50 MG PO TABS: 25.0000 mg | ORAL_TABLET | Freq: Every evening | ORAL | Status: DC | PRN

## 2024-08-02 MED ORDER — SODIUM CHLORIDE 0.9 % IV SOLN: 2.0000 g | Freq: Once | INTRAVENOUS | Status: DC

## 2024-08-02 MED ORDER — PREGABALIN 75 MG PO CAPS
150.0000 mg | ORAL_CAPSULE | Freq: Two times a day (BID) | ORAL | Status: DC
  Administered 2024-08-02 – 2024-08-04 (×3): 150 mg via ORAL
  Filled 2024-08-02: qty 3
  Filled 2024-08-02 (×2): qty 2
  Filled 2024-08-02: qty 3

## 2024-08-02 MED ORDER — MAGNESIUM SULFATE 2 GM/50ML IV SOLN
2.0000 g | Freq: Once | INTRAVENOUS | Status: DC
  Filled 2024-08-02: qty 50

## 2024-08-02 NOTE — ED Notes (Signed)
 This RN and Thersia, NA attempted to help pt get cleaned up and hooked back to the monitor, and attempt at new peripheral IV placement. Pt kept refusing and kicking and screaming. Pt's mother on the phone at bedside and is aware of pt's behavior and refusal. Messaged Dr. Franchot on Epic for him to come down and assess his pt.

## 2024-08-02 NOTE — ED Notes (Signed)
Brief changed, pericare provided 

## 2024-08-02 NOTE — Assessment & Plan Note (Signed)
-   Will continue IV Rocephin . - Will follow urine culture and sensitivity.

## 2024-08-02 NOTE — Assessment & Plan Note (Addendum)
-   This is likely secondary to generalized weakness with associated hypokalemia, hypomagnesemia and UTI. - Management as below. - PT evaluation will be obtained.

## 2024-08-02 NOTE — ED Notes (Signed)
 Pt keeps putting pulling off cardiac leads. Charge RN in room aware. CCMD aware

## 2024-08-02 NOTE — Progress Notes (Signed)
 PHARMACIST - PHYSICIAN COMMUNICATION  CONCERNING:  Enoxaparin  (Lovenox ) for DVT Prophylaxis    RECOMMENDATION: Patient was prescribed enoxaprin 40mg  q24 hours for VTE prophylaxis.   Filed Weights   08/01/24 2315  Weight: 41.6 kg (91 lb 11.4 oz)    Body mass index is 16.77 kg/m.  Estimated Creatinine Clearance: 51.8 mL/min (by C-G formula based on SCr of 0.93 mg/dL).  Patient is candidate for enoxaparin  30mg  every 24 hours based on CrCl <37ml/min or Weight <45kg  DESCRIPTION: Pharmacy has adjusted enoxaparin  dose per [facility] policy.  Patient is now receiving enoxaparin  30 mg every 24 hours   Rankin CANDIE Dills, PharmD, [facility] 08/02/2024 3:03 AM

## 2024-08-02 NOTE — Hospital Course (Addendum)
 Diffuse midline ttp along spine R hip pain   Decreased strength with dorsiflexion and plantar flexion of R foot, 4/5 with knee extension and flexion.    [phone]  [name]

## 2024-08-02 NOTE — Assessment & Plan Note (Signed)
 Aggressive potassium replacement will be provided.

## 2024-08-02 NOTE — ED Notes (Signed)
 This tech put pt on bed pan and sent off urine that was collected to the lab, pt was also hooked up to cardiac monitoring in room.

## 2024-08-02 NOTE — Assessment & Plan Note (Signed)
 Aggressive magnesium  replacement will be provided.

## 2024-08-02 NOTE — H&P (Signed)
 [place]   PATIENT NAME: [name]    MR#:  [id number]  DATE OF BIRTH:  Apr 16, 1981  DATE OF ADMISSION:  08/02/2024  PRIMARY CARE PHYSICIAN: Eliverto Bette Hover, MD   Patient is coming from: Home  REQUESTING/REFERRING PHYSICIAN: Ward, Josette SAILOR, DO  CHIEF COMPLAINT:   Chief Complaint  Patient presents with   Abdominal Pain    HISTORY OF PRESENT ILLNESS:  [name] is a 43 y.o. Caucasian female with medical history significant for Gettleman syndrome, with hypokalemia, hypomagnesemia, status post PPM, RSD, cerebral palsy and chronic back pain, who presented to the emergency room with acute onset of recurrent falls.  The patient has been feeling generally weak.  She has been having diminished p.o. intake.  She admits to dysuria as well as urinary frequency and urgency.  She has been having nausea without vomiting.  She admits to chills but did not have a measured fever.  No cough or wheezing or dyspnea.  No chest pain or palpitations.  She denied any presyncope or syncope or head injuries.  ED Course: When she came to the ER vital signs were within normal.  Labs revealed hypokalemia of 2.8 and hypomagnesemia of 1.3 with a BUN of 21 and otherwise unremarkable CMP.  UA came back positive for UTI.  CBC showed thrombocytopenia 130 but was otherwise normal. EKG as reviewed by me : EKG showed atrial paced rhythm with a rate of 85 with right axis deviation. Imaging: Noncontrast head CT scan revealed no acute intracranial normalities.  The patient was given potassium and magnesium  replacement as well as IV Rocephin .  She will be admitted to a progressive bed for further evaluation and management. PAST MEDICAL HISTORY:   Past Medical History:  Diagnosis Date   Carpal tunnel syndrome    Cerebral palsy (HCC)    Chronic back pain    Gitelman syndrome    Hypokalemia    now resolved   Magnesium  deficiency    Pacemaker    PONV (postoperative nausea and vomiting)     Postural orthostatic tachycardia syndrome    s/p pacemaker placement.   POTS (postural orthostatic tachycardia syndrome)    RSD (reflex sympathetic dystrophy)    right foot,    RSD (reflex sympathetic dystrophy)    SA node dysfunction (HCC) 12/13/2006    PAST SURGICAL HISTORY:   Past Surgical History:  Procedure Laterality Date   ABDOMINAL HYSTERECTOMY Left 12/14/2011   Dr. Arloa   CHOLECYSTECTOMY     COLONOSCOPY WITH PROPOFOL  N/A 12/11/2022   Procedure: COLONOSCOPY WITH PROPOFOL ;  Surgeon: Unk Corinn Skiff, MD;  Location: [facility] ENDOSCOPY;  Service: Gastroenterology;  Laterality: N/A;   COLONOSCOPY WITH PROPOFOL  N/A 12/13/2022   Procedure: COLONOSCOPY WITH PROPOFOL ;  Surgeon: Unk Corinn Skiff, MD;  Location: [facility] ENDOSCOPY;  Service: Gastroenterology;  Laterality: N/A;   INSERT / REPLACE / REMOVE PACEMAKER     ORTHOPEDIC SURGERY     PACEMAKER INSERTION     PORTACATH PLACEMENT      SOCIAL HISTORY:   Social History   Tobacco Use   Smoking status: Never   Smokeless tobacco: Never  Substance Use Topics   Alcohol use: No    Alcohol/week: 0.0 standard drinks of alcohol    FAMILY HISTORY:   Family History  Problem Relation Age of Onset   Diabetes Mother    Hypertension Mother     DRUG ALLERGIES:   Allergies  Allergen Reactions   Duloxetine  Other reaction(s): Other (See Comments), Palpitations Other reaction(s): Hallucinations, Hyperactive behavior (finding), Other (See Comments) Stressed out     Flecainide     Other reaction(s): Other (See Comments) Other reaction(s): Hypomagnesemia (disorder), Other (See Comments) Other Reaction: QUESTION HYPOMAGNESEMIA     Fluoxetine     Other reaction(s): Nausea And Vomiting, Other (See Comments) Other reaction(s): Other (See Comments) Other Reaction: INCREASED HR  causes irritability and irrational behavior I go crazy   Gabapentin     Goes crazy Other reaction(s): Other (See Comments) Other reaction(s):  Hyperactive behavior (finding), Mental Status Changes (intolerance), Other (See Comments) Goes crazy Hallucinations   Baclofen     Other reaction(s): Other (See Comments), Other (See Comments) Weakness  Other reaction(s): Other (See Comments) Weakness and loose Weakness  Other reaction(s):  Weakness     Cephalosporins     Other reaction(s): Hives, Rash Received pre-op vancomycin and cefazolin 07/20/16. Developed urticarial rash on bilateral lower extremities + slight periorbital swelling. Treated with diphenhydramine . Received pre-op vancomycin and cefazolin 07/20/16. Developed urticarial rash on bilateral lower extremities + slight periorbital swelling. Treated with diphenhydramine . Received pre-op vancomycin and cefazolin 07/20/16. Developed urticarial rash on bilateral lower extremities + slight periorbital swelling. Treated with diphenhydramine .   Sulfasalazine     Other reaction(s): Nausea And Vomiting   Sulfa Antibiotics Nausea And Vomiting   Vancomycin     Other reaction(s): Hives Received pre-op vancomycin and cefazolin 07/20/16. Developed urticarial rash on bilateral lower extremities + slight periorbital swelling. Treated with diphenhydramine . Received pre-op vancomycin and cefazolin 07/20/16. Developed urticarial rash on bilateral lower extremities + slight periorbital swelling. Treated with diphenhydramine . Received pre-op vancomycin and cefazolin 07/20/16. Developed urticarial rash on bilateral lower extremities + slight periorbital swelling. Treated with diphenhydramine .   Cefazolin Rash    Tolerated ceftriaxone  2023 and cephalexin  2022    REVIEW OF SYSTEMS:   ROS As per history of present illness. All pertinent systems were reviewed above. Constitutional, HEENT, cardiovascular, respiratory, GI, GU, musculoskeletal, neuro, psychiatric, endocrine, integumentary and hematologic systems were reviewed and are otherwise negative/unremarkable except for positive findings mentioned above  in the HPI.   MEDICATIONS AT HOME:   Prior to Admission medications   Medication Sig Start Date End Date Taking? Authorizing Provider  acetaminophen  (TYLENOL ) 325 MG tablet Take 2 tablets (650 mg total) by mouth every 6 (six) hours as needed for mild pain (pain score 1-3) or fever. 09/28/23   Von Bellis, MD  Cholecalciferol  (D2000 ULTRA STRENGTH) 50 MCG (2000 UT) CAPS Take 1 tablet by mouth daily.    [provider]  cyanocobalamin  (VITAMIN B12) 1000 MCG tablet Take 1,000 mcg by mouth daily.    [provider]  dapagliflozin  propanediol (FARXIGA ) 10 MG TABS tablet Take 1 tablet by mouth daily. 12/30/23 12/29/24  [provider]  ELIQUIS  5 MG TABS tablet Take 5 mg by mouth 2 (two) times daily. 08/12/22   [provider]  heparin  lock flush 100 UNIT/ML SOLN injection Inject 5 mL (500 Units total) as directed as needed for line care. Flush using SASH (saline, administer  IV, saline, heparin ) method as directed. Flush IV catheter with heparin  after the last saline flush.  Use syringe ONE TIME only then discard. Storage: Room Temperature. 08/30/23 08/30/24  [provider]  Magnesium  Oxide -Mg Supplement (MAG-OXIDE) 200 MG TABS Take 2 tablets (400 mg total) by mouth daily. 07/04/24   Patel, Sona, MD  methocarbamol  (ROBAXIN ) 750 MG tablet Take 750 mg by mouth at bedtime as  needed. 04/24/24   [provider]  methylphenidate  (RITALIN ) 20 MG tablet Take 20 mg by mouth 3 (three) times daily. Take 1 tablet (20 mg) TID at 0800, 1200 & 2100-2200    [provider]  montelukast  (SINGULAIR ) 10 MG tablet Take 10 mg by mouth daily as needed (allergies). 10/26/22   [provider]  pantoprazole  (PROTONIX ) 40 MG tablet Take 40 mg by mouth 2 (two) times daily. 01/26/24   [provider]  potassium chloride  SA (KLOR-CON  M) 20 MEQ tablet Take 1 tablet (20 mEq total) by mouth 2 (two) times daily. 07/04/24   Patel, Sona, MD  pregabalin  (LYRICA )  150 MG capsule Take 150 mg by mouth 2 (two) times daily. 04/24/24   [provider]  PREMARIN  vaginal cream Place 0.5 g vaginally 2 (two) times a week. 11/08/22   [provider]  promethazine  (PHENERGAN ) 25 MG tablet Take 12.5 mg by mouth every 8 (eight) hours as needed. 06/14/24   [provider]  sucralfate  (CARAFATE ) 1 g tablet Take 1 g by mouth 4 (four) times daily.    [provider]  traMADol  (ULTRAM ) 50 MG tablet Take 1 tablet by mouth every 12 (twelve) hours as needed. 04/24/24   [provider]      VITAL SIGNS:  Blood pressure 124/85, pulse 85, temperature 98.3 F (36.8 C), temperature source Oral, resp. rate 20, height 5' 2 (1.575 m), weight 41.6 kg, last menstrual period 06/09/2012, SpO2 100%.  PHYSICAL EXAMINATION:  Physical Exam  GENERAL:  42 y.o.-year-old, Caucasian female patient lying in the bed with no acute distress.  EYES: Pupils equal, round, reactive to light and accommodation. No scleral icterus. Extraocular muscles intact.  HEENT: Head atraumatic, normocephalic. Oropharynx and nasopharynx clear.  NECK:  Supple, no jugular venous distention. No thyroid  enlargement, no tenderness.  LUNGS: Normal breath sounds bilaterally, no wheezing, rales,rhonchi or crepitation. No use of accessory muscles of respiration.  CARDIOVASCULAR: Regular rate and rhythm, S1, S2 normal. No murmurs, rubs, or gallops.  ABDOMEN: Soft, nondistended, nontender. Bowel sounds present. No organomegaly or mass.  EXTREMITIES: No pedal edema, cyanosis, or clubbing.  NEUROLOGIC: Cranial nerves II through XII are intact. Muscle strength 5/5 in all extremities. Sensation intact. Gait not checked.  PSYCHIATRIC: The patient is alert and oriented x 3.  Normal affect and good eye contact. SKIN: No obvious rash, lesion, or ulcer.   LABORATORY PANEL:   CBC Recent Labs  Lab 08/02/24 0434  WBC 5.3  HGB 11.1*  HCT 34.1*  PLT 115*    ------------------------------------------------------------------------------------------------------------------  Chemistries  Recent Labs  Lab 08/01/24 2320 08/02/24 0434  NA 143 140  K 2.8* 3.9  CL 103 105  CO2 28 24  GLUCOSE 117* 108*  BUN 21* 20  CREATININE 0.93 0.87  CALCIUM  9.9 9.2  MG 1.3*  --   AST 19  --   ALT 15  --   ALKPHOS 82  --   BILITOT 0.7  --    ------------------------------------------------------------------------------------------------------------------  Cardiac Enzymes No results for input(s): TROPONINI in the last 168 hours. ------------------------------------------------------------------------------------------------------------------  RADIOLOGY:  CT HEAD WO CONTRAST ( ) Result Date: 08/02/2024 CLINICAL DATA:  Fall, head trauma EXAM: CT HEAD WITHOUT CONTRAST TECHNIQUE: Contiguous axial images were obtained from the base of the skull through the vertex without intravenous contrast. RADIATION DOSE REDUCTION: This exam was performed according to the departmental dose-optimization program which includes automated exposure control, adjustment of the mA and/or kV according to patient size and/or use of  iterative reconstruction technique. COMPARISON:  07/02/2024 FINDINGS: Brain: No acute intracranial abnormality. Specifically, no hemorrhage, hydrocephalus, mass lesion, acute infarction, or significant intracranial injury. Old left basal ganglia lacunar infarct. Vascular: No hyperdense vessel or unexpected calcification. Skull: No acute calvarial abnormality. Sinuses/Orbits: No acute findings Other: None IMPRESSION: No acute intracranial abnormality. Electronically Signed   By: Franky Crease [name]   On: 08/02/2024 03:10   DG Hip Unilat W or Wo Pelvis 2-3 Views Right Result Date: 08/01/2024 CLINICAL DATA:  Fall with right hip injury. EXAM: DG HIP (WITH OR WITHOUT PELVIS) 2-3V RIGHT COMPARISON:  Similar study 07/02/2024. FINDINGS: Three views with AP pelvis  and AP and frog-leg right hip. There is no evidence of hip fracture or dislocation. There is no evidence of arthropathy or significant focal bone abnormality. AP pelvis without evidence of pelvic fracture or diastasis. There R mild spurring changes at the SI joints and pubic symphysis. Moderate fecal stasis. IMPRESSION: 1. No evidence of fracture or dislocation. 2. Moderate fecal stasis. Electronically Signed   By: Francis Quam [name]   On: 08/01/2024 23:35      IMPRESSION AND PLAN:  Assessment and Plan: * Frequent falls - This is likely secondary to generalized weakness with associated hypokalemia, hypomagnesemia and UTI. - Management as below. - PT evaluation will be obtained.  Acute lower UTI - Will continue IV Rocephin . - Will follow urine culture and sensitivity.  Hypokalemia Aggressive potassium replacement will be provided.  Hypomagnesemia Aggressive magnesium  replacement will be provided.    DVT prophylaxis: Lovenox .  Advanced Care Planning:  Code Status: full code.  Family Communication:  The plan of care was discussed in details with the patient (and family). I answered all questions. The patient agreed to proceed with the above mentioned plan. Further management will depend upon hospital course. Disposition Plan: Back to previous home environment Consults called: none.  All the records are reviewed and case discussed with ED provider.  Status is: Inpatient  At the time of the admission, it appears that the appropriate admission status for this patient is inpatient.  This is judged to be reasonable and necessary in order to provide the required intensity of service to ensure the patient's safety given the presenting symptoms, physical exam findings and initial radiographic and laboratory data in the context of comorbid conditions.  The patient requires inpatient status due to high intensity of service, high risk of further deterioration and high frequency of surveillance  required.  I certify that at the time of admission, it is my clinical judgment that the patient will require inpatient hospital care extending more than 2 midnights.                            Dispo: The patient is from: Home              Anticipated d/c is to: Home              Patient currently is not medically stable to d/c.              Difficult to place patient: No  Madison DELENA Peaches M.D on 08/02/2024 at 6:43 AM  [facility]   From 7 PM-7 AM, contact night-coverage [email or web address]  CC: Primary care physician; Eliverto Bette Hover, MD

## 2024-08-02 NOTE — ED Notes (Signed)
 Set up patients sandwhich tray in room. Pt being more cooperative and deciding to let this RN help with her care.

## 2024-08-02 NOTE — ED Notes (Signed)
 This tech provided peri care, removed soiled brief, and changed chux pads. Pt refused to have a new brief put on. Pt states no other needs at this time.

## 2024-08-02 NOTE — ED Provider Notes (Signed)
 [facility] Provider Note    Event Date/Time   First MD Initiated Contact with Patient 08/02/24 0225     (approximate)   History   Abdominal Pain   HPI  [name] is a 43 y.o. female  with history of spastic cerebral palsy, Gitelman syndrome, chronic hypokalemia and hypomagnesemia, POTS status post pacemaker placement who presents to the emergency department with multiple falls.  Reports she did hit her head but did not lose consciousness.  She is on Eliquis .  Presents frequently for the same when her electrolytes are abnormal.  She also thinks she may have a UTI as she has had dysuria and low back pain.  No vomiting, diarrhea.  History of chronic constipation.  Reports she injured her right hip when she fell.  No neck pain, headache.   History provided by patient.    Past Medical History:  Diagnosis Date   Carpal tunnel syndrome    Cerebral palsy (HCC)    Chronic back pain    Gitelman syndrome    Hypokalemia    now resolved   Magnesium  deficiency    Pacemaker    PONV (postoperative nausea and vomiting)    Postural orthostatic tachycardia syndrome    s/p pacemaker placement.   POTS (postural orthostatic tachycardia syndrome)    RSD (reflex sympathetic dystrophy)    right foot,    RSD (reflex sympathetic dystrophy)    SA node dysfunction (HCC) 12/13/2006    Past Surgical History:  Procedure Laterality Date   ABDOMINAL HYSTERECTOMY Left 12/14/2011   Dr. Arloa   CHOLECYSTECTOMY     COLONOSCOPY WITH PROPOFOL  N/A 12/11/2022   Procedure: COLONOSCOPY WITH PROPOFOL ;  Surgeon: Unk Corinn Skiff, MD;  Location: [facility] ENDOSCOPY;  Service: Gastroenterology;  Laterality: N/A;   COLONOSCOPY WITH PROPOFOL  N/A 12/13/2022   Procedure: COLONOSCOPY WITH PROPOFOL ;  Surgeon: Unk Corinn Skiff, MD;  Location: [facility] ENDOSCOPY;  Service: Gastroenterology;  Laterality: N/A;   INSERT / REPLACE / REMOVE PACEMAKER     ORTHOPEDIC SURGERY     PACEMAKER  INSERTION     PORTACATH PLACEMENT      MEDICATIONS:  Prior to Admission medications   Medication Sig Start Date End Date Taking? Authorizing Provider  acetaminophen  (TYLENOL ) 325 MG tablet Take 2 tablets (650 mg total) by mouth every 6 (six) hours as needed for mild pain (pain score 1-3) or fever. 09/28/23   Von Bellis, MD  Cholecalciferol  (D2000 ULTRA STRENGTH) 50 MCG (2000 UT) CAPS Take 1 tablet by mouth daily.    [provider]  cyanocobalamin  (VITAMIN B12) 1000 MCG tablet Take 1,000 mcg by mouth daily.    [provider]  dapagliflozin  propanediol (FARXIGA ) 10 MG TABS tablet Take 1 tablet by mouth daily. 12/30/23 12/29/24  [provider]  ELIQUIS  5 MG TABS tablet Take 5 mg by mouth 2 (two) times daily. 08/12/22   [provider]  heparin  lock flush 100 UNIT/ML SOLN injection Inject 5 mL (500 Units total) as directed as needed for line care. Flush using SASH (saline, administer  IV, saline, heparin ) method as directed. Flush IV catheter with heparin  after the last saline flush.  Use syringe ONE TIME only then discard. Storage: Room Temperature. 08/30/23 08/30/24  [provider]  Magnesium  Oxide -Mg Supplement (MAG-OXIDE) 200 MG TABS Take 2 tablets (400 mg total) by mouth daily. 07/04/24   Patel, Sona, MD  methocarbamol  (ROBAXIN ) 750 MG tablet Take 750 mg by mouth at bedtime as needed. 04/24/24  [provider]  methylphenidate  (RITALIN ) 20 MG tablet Take 20 mg by mouth 3 (three) times daily. Take 1 tablet (20 mg) TID at 0800, 1200 & 2100-2200    [provider]  montelukast  (SINGULAIR ) 10 MG tablet Take 10 mg by mouth daily as needed (allergies). 10/26/22   [provider]  pantoprazole  (PROTONIX ) 40 MG tablet Take 40 mg by mouth 2 (two) times daily. 01/26/24   [provider]  potassium chloride  SA (KLOR-CON  M) 20 MEQ tablet Take 1 tablet (20 mEq total) by mouth 2 (two) times daily. 07/04/24   Patel, Sona, MD   pregabalin  (LYRICA ) 150 MG capsule Take 150 mg by mouth 2 (two) times daily. 04/24/24   [provider]  PREMARIN  vaginal cream Place 0.5 g vaginally 2 (two) times a week. 11/08/22   [provider]  promethazine  (PHENERGAN ) 25 MG tablet Take 12.5 mg by mouth every 8 (eight) hours as needed. 06/14/24   [provider]  sucralfate  (CARAFATE ) 1 g tablet Take 1 g by mouth 4 (four) times daily.    [provider]  traMADol  (ULTRAM ) 50 MG tablet Take 1 tablet by mouth every 12 (twelve) hours as needed. 04/24/24   [provider]    Physical Exam   Triage Vital Signs: ED Triage Vitals  Encounter Vitals Group     BP 08/01/24 2314 129/89     Girls Systolic BP Percentile --      Girls Diastolic BP Percentile --      Boys Systolic BP Percentile --      Boys Diastolic BP Percentile --      Pulse Rate 08/01/24 2314 87     Resp 08/01/24 2314 16     Temp 08/01/24 2314 98 F (36.7 C)     Temp Source 08/01/24 2314 Oral     SpO2 08/01/24 2307 100 %     Weight 08/01/24 2315 91 lb 11.4 oz (41.6 kg)     Height 08/01/24 2315 5' 2 (1.575 m)     Head Circumference --      Peak Flow --      Pain Score 08/01/24 2314 6     Pain Loc --      Pain Education --      Exclude from Growth Chart --     Most recent vital signs: Vitals:   08/01/24 2314 08/02/24 0040  BP: 129/89 125/88  Pulse: 87 85  Resp: 16 16  Temp: 98 F (36.7 C) 98.3 F (36.8 C)  SpO2: 100% 100%    CONSTITUTIONAL: Alert, responds appropriately to questions.  Chronically ill-appearing HEAD: Normocephalic, atraumatic EYES: Conjunctivae clear, pupils appear equal, sclera nonicteric ENT: normal nose; moist mucous membranes NECK: Supple, normal ROM, no midline spinal tenderness or step-off or deformity CARD: RRR; S1 and S2 appreciated RESP: Normal chest excursion without splinting or tachypnea; breath sounds clear and equal bilaterally; no wheezes, no rhonchi, no rales, no hypoxia or  respiratory distress, speaking full sentences ABD/GI: Non-distended; soft, non-tender, no rebound, no guarding, no peritoneal signs BACK: The back appears normal EXT: Normal ROM in all joints; no deformity noted, no edema, no leg length discrepancy SKIN: Normal color for age and race; warm; no rash on exposed skin NEURO: Moves all extremities equally, no facial asymmetry PSYCH: The patient's mood and manner are appropriate.   ED Results / Procedures / Treatments   LABS: (all labs ordered are listed, but only abnormal results are displayed) Labs Reviewed  COMPREHENSIVE  METABOLIC PANEL WITH GFR - Abnormal; Notable for the following components:      Result Value   Potassium 2.8 (*)    Glucose, Bld 117 (*)    BUN 21 (*)    All other components within normal limits  CBC - Abnormal; Notable for the following components:   Platelets 130 (*)    All other components within normal limits  URINALYSIS, ROUTINE W REFLEX MICROSCOPIC - Abnormal; Notable for the following components:   Color, Urine YELLOW (*)    APPearance HAZY (*)    Hgb urine dipstick SMALL (*)    Ketones, ur 20 (*)    Leukocytes,Ua LARGE (*)    Bacteria, UA FEW (*)    All other components within normal limits  MAGNESIUM  - Abnormal; Notable for the following components:   Magnesium  1.3 (*)    All other components within normal limits  URINE CULTURE  LIPASE, BLOOD  CBC  CREATININE, SERUM  BASIC METABOLIC PANEL WITH GFR  CBC     EKG:  EKG Interpretation Date/Time:  Thursday August 02 2024 00:44:46 EDT Ventricular Rate:  85 PR Interval:  128 QRS Duration:  102 QT Interval:  372 QTC Calculation: 442 R Axis:   94  Text Interpretation: Atrial-paced rhythm Rightward axis Abnormal ECG When compared with ECG of 03-Jul-2024 02:31, PREVIOUS ECG IS PRESENT Confirmed by Neomi Neptune [phone]) on 08/02/2024 2:26:27 AM         RADIOLOGY: My personal review and interpretation of imaging: X-ray of the right hip shows no  fracture or dislocation.  CT head unremarkable.  I have personally reviewed all radiology reports.   CT HEAD WO CONTRAST ( ) Result Date: 08/02/2024 CLINICAL DATA:  Fall, head trauma EXAM: CT HEAD WITHOUT CONTRAST TECHNIQUE: Contiguous axial images were obtained from the base of the skull through the vertex without intravenous contrast. RADIATION DOSE REDUCTION: This exam was performed according to the departmental dose-optimization program which includes automated exposure control, adjustment of the mA and/or kV according to patient size and/or use of iterative reconstruction technique. COMPARISON:  07/02/2024 FINDINGS: Brain: No acute intracranial abnormality. Specifically, no hemorrhage, hydrocephalus, mass lesion, acute infarction, or significant intracranial injury. Old left basal ganglia lacunar infarct. Vascular: No hyperdense vessel or unexpected calcification. Skull: No acute calvarial abnormality. Sinuses/Orbits: No acute findings Other: None IMPRESSION: No acute intracranial abnormality. Electronically Signed   By: Franky Crease [name]   On: 08/02/2024 03:10   DG Hip Unilat W or Wo Pelvis 2-3 Views Right Result Date: 08/01/2024 CLINICAL DATA:  Fall with right hip injury. EXAM: DG HIP (WITH OR WITHOUT PELVIS) 2-3V RIGHT COMPARISON:  Similar study 07/02/2024. FINDINGS: Three views with AP pelvis and AP and frog-leg right hip. There is no evidence of hip fracture or dislocation. There is no evidence of arthropathy or significant focal bone abnormality. AP pelvis without evidence of pelvic fracture or diastasis. There R mild spurring changes at the SI joints and pubic symphysis. Moderate fecal stasis. IMPRESSION: 1. No evidence of fracture or dislocation. 2. Moderate fecal stasis. Electronically Signed   By: Francis Quam [name]   On: 08/01/2024 23:35     PROCEDURES:  Critical Care performed: Yes, see critical care procedure note(s)   CRITICAL CARE Performed by: Neptune Tatym Schermer   Total critical  care time: 30 minutes  Critical care time was exclusive of separately billable procedures and treating other patients.  Critical care was necessary to treat or prevent imminent or life-threatening deterioration.  Critical care was time  spent personally by me on the following activities: development of treatment plan with patient and/or surrogate as well as nursing, discussions with consultants, evaluation of patient's response to treatment, examination of patient, obtaining history from patient or surrogate, ordering and performing treatments and interventions, ordering and review of laboratory studies, ordering and review of radiographic studies, pulse oximetry and re-evaluation of patient's condition.   SABRA1-3 Lead EKG Interpretation  Performed by: Rosalinda Seaman, Josette SAILOR, DO Authorized by: Aunya Lemler, Josette SAILOR, DO     Interpretation: normal     ECG rate:  85   ECG rate assessment: normal     Rhythm: paced     Ectopy: none     Conduction: normal       IMPRESSION / MDM / ASSESSMENT AND PLAN / ED COURSE  I reviewed the triage vital signs and the nursing notes.    Patient here with frequent falls.  Reports hitting her head on Eliquis .  History of the same with frequent electrolyte abnormalities.  The patient is on the cardiac monitor to evaluate for evidence of arrhythmia and/or significant heart rate changes.   DIFFERENTIAL DIAGNOSIS (includes but not limited to):   Intracranial hemorrhage, skull fracture, concussion, hip contusion, hip fracture, hip dislocation, electrolyte derangement, dehydration, UTI   Patient's presentation is most consistent with acute presentation with potential threat to life or bodily function.   PLAN: Labs show potassium of 2.8, magnesium  of 1.3.  Will give IV replacement and keep on cardiac monitoring.  EKG shows paced rhythm.  No ectopy noted.  Patient is also concerned she has a UTI.  Will obtain urinalysis, urine culture.  Abdominal exam is benign.  She also  reports fall with right hip pain.  X-ray reviewed and interpreted by myself and radiologist and is unremarkable.  She also states that she hit her head but denies headache, neck or back pain, neurodeficits.  Will obtain CT of the head given she is on Eliquis .   MEDICATIONS GIVEN IN ED: Medications  potassium chloride  10 mEq in 100 mL IVPB (10 mEq Intravenous New Bag/Given 08/02/24 0341)  enoxaparin  (LOVENOX ) injection 30 mg (has no administration in time range)  0.9 % NaCl with KCl 20 mEq/ L  infusion (has no administration in time range)  acetaminophen  (TYLENOL ) tablet 650 mg (has no administration in time range)    Or  acetaminophen  (TYLENOL ) suppository 650 mg (has no administration in time range)  traZODone  (DESYREL ) tablet 25 mg (has no administration in time range)  magnesium  hydroxide (MILK OF MAGNESIA) suspension 30 mL (has no administration in time range)  ondansetron  (ZOFRAN ) tablet 4 mg (has no administration in time range)    Or  ondansetron  (ZOFRAN ) injection 4 mg (has no administration in time range)  potassium chloride  (KLOR-CON ) packet 40 mEq (has no administration in time range)  Levofloxacin  (LEVAQUIN ) IVPB 250 mg (has no administration in time range)  magnesium  sulfate IVPB 4 g 100 mL (has no administration in time range)  sucralfate  (CARAFATE ) tablet 1 g (1 g Oral Given 08/02/24 0310)     ED COURSE: CT head unremarkable.  Urine does show pyuria, bacteria.  Prior urine cultures have grown mixed flora.  Will give Rocephin  here.  She has allergy listed to cefazolin but it appears she has tolerated Rocephin  before without difficulty.  Will discuss with the hospitalist for admission.   CONSULTS:  Consulted and discussed patient's case with hospitalist, Dr. Lawence.  I have recommended admission and consulting physician agrees and will place admission  orders.  Patient (and family if present) agree with this plan.   I reviewed all nursing notes, vitals, pertinent previous  records.  All labs, EKGs, imaging ordered have been independently reviewed and interpreted by myself.    OUTSIDE RECORDS REVIEWED: Reviewed last admission.       FINAL CLINICAL IMPRESSION(S) / ED DIAGNOSES   Final diagnoses:  Hypokalemia  Hypomagnesemia  Frequent falls  Injury of head, initial encounter  Acute UTI     Rx / DC Orders   ED Discharge Orders     None        Note:  This document was prepared using Dragon voice recognition software and may include unintentional dictation errors.   Seleen Walter, Josette SAILOR, DO 08/02/24 [phone]

## 2024-08-02 NOTE — ED Notes (Signed)
Pt given diet sprite as requested.

## 2024-08-02 NOTE — Plan of Care (Addendum)
 Patient states she has had multiple falls. Continues to have pain in R hip.   Physical Exam  Constitutional: In no distress. Thin Cardiovascular: Normal rate, regular rhythm. No lower extremity edema  Pulmonary: Non labored breathing on room air, no wheezing or rales.   Abdominal: Soft. Non distended and non tender   Neurological: Alert and oriented to person, place, and time. RLE Decreased strength with dorsiflexion and plantar flexion of R foot, 4/5 with knee extension and flexion. LLE 4+/5 Skin: Skin is warm and dry.    Plan:  Weakness and falls related to persistent electrolyte abnl. CT head without acute abnl. Xr of CTL spine without acute changes to explain weakness. Possibly related to UTI  Continue supplementation Reportedly with cefazolin allergy On levaquin    PT/OT

## 2024-08-03 ENCOUNTER — Encounter: Payer: Self-pay | Admitting: Family Medicine

## 2024-08-03 DIAGNOSIS — N3090 Cystitis, unspecified without hematuria: Secondary | ICD-10-CM | POA: Diagnosis not present

## 2024-08-03 DIAGNOSIS — R296 Repeated falls: Secondary | ICD-10-CM | POA: Diagnosis not present

## 2024-08-03 DIAGNOSIS — E876 Hypokalemia: Secondary | ICD-10-CM | POA: Diagnosis not present

## 2024-08-03 LAB — CBC
HCT: 32.5 % — ABNORMAL LOW (ref 36.0–46.0)
Hemoglobin: 10.7 g/dL — ABNORMAL LOW (ref 12.0–15.0)
MCH: 26.6 pg (ref 26.0–34.0)
MCHC: 32.9 g/dL (ref 30.0–36.0)
MCV: 80.6 fL (ref 80.0–100.0)
Platelets: 113 K/uL — ABNORMAL LOW (ref 150–400)
RBC: 4.03 MIL/uL (ref 3.87–5.11)
RDW: 14.5 % (ref 11.5–15.5)
WBC: 4.7 K/uL (ref 4.0–10.5)
nRBC: 0 % (ref 0.0–0.2)

## 2024-08-03 LAB — BASIC METABOLIC PANEL WITH GFR
Anion gap: 14 (ref 5–15)
BUN: 16 mg/dL (ref 6–20)
CO2: 20 mmol/L — ABNORMAL LOW (ref 22–32)
Calcium: 8.9 mg/dL (ref 8.9–10.3)
Chloride: 106 mmol/L (ref 98–111)
Creatinine, Ser: 0.82 mg/dL (ref 0.44–1.00)
GFR, Estimated: >60 mL/min (ref >60)
Glucose, Bld: 71 mg/dL (ref 70–99)
Potassium: 3.4 mmol/L — ABNORMAL LOW (ref 3.5–5.1)
Sodium: 140 mmol/L (ref 135–145)

## 2024-08-03 LAB — URINE CULTURE

## 2024-08-03 LAB — VITAMIN B12: Vitamin B-12: 171 pg/mL — ABNORMAL LOW (ref 180–914)

## 2024-08-03 LAB — VITAMIN D 25 HYDROXY (VIT D DEFICIENCY, FRACTURES): Vit D, 25-Hydroxy: 20.3 ng/mL — ABNORMAL LOW (ref 30–100)

## 2024-08-03 LAB — MAGNESIUM: Magnesium: 1.4 mg/dL — ABNORMAL LOW (ref 1.7–2.4)

## 2024-08-03 MED ORDER — MAGNESIUM SULFATE 2 GM/50ML IV SOLN
2.0000 g | Freq: Once | INTRAVENOUS | Status: AC
  Administered 2024-08-03: 2 g via INTRAVENOUS
  Filled 2024-08-03: qty 50

## 2024-08-03 MED ORDER — MAGNESIUM CHLORIDE 64 MG PO TBEC
2.0000 | DELAYED_RELEASE_TABLET | Freq: Two times a day (BID) | ORAL | Status: DC
  Filled 2024-08-03 (×2): qty 2

## 2024-08-03 MED ORDER — SUCRALFATE 1 GM/10ML PO SUSP
1.0000 g | Freq: Four times a day (QID) | ORAL | Status: DC
  Administered 2024-08-03 – 2024-08-04 (×3): 1 g via ORAL
  Filled 2024-08-03 (×5): qty 10

## 2024-08-03 MED ORDER — MAGNESIUM SULFATE 2 GM/50ML IV SOLN: 2.0000 g | Freq: Once | INTRAVENOUS | Status: DC

## 2024-08-03 MED ORDER — POTASSIUM CHLORIDE 20 MEQ PO PACK
40.0000 meq | PACK | Freq: Every day | ORAL | Status: DC
  Filled 2024-08-03 (×2): qty 2

## 2024-08-03 MED ORDER — MAGNESIUM CHLORIDE 64 MG PO TBEC
2.0000 | DELAYED_RELEASE_TABLET | Freq: Two times a day (BID) | ORAL | Status: DC
  Filled 2024-08-03: qty 2

## 2024-08-03 MED ORDER — ENSURE PLUS HIGH PROTEIN PO LIQD: 237.0000 mL | Freq: Two times a day (BID) | ORAL | Status: DC

## 2024-08-03 MED ORDER — LACTATED RINGERS IV SOLN: INTRAVENOUS | Status: AC

## 2024-08-03 MED ORDER — VITAMIN B-12 1000 MCG PO TABS: 500.0000 ug | ORAL_TABLET | Freq: Every day | ORAL | Status: DC

## 2024-08-03 MED ORDER — FOSFOMYCIN TROMETHAMINE 3 G PO PACK
3.0000 g | PACK | Freq: Once | ORAL | Status: DC
  Filled 2024-08-03: qty 3

## 2024-08-03 MED ORDER — LACTULOSE 10 GM/15ML PO SOLN
20.0000 g | Freq: Once | ORAL | Status: DC
  Filled 2024-08-03: qty 30

## 2024-08-03 MED ORDER — VITAMIN D 25 MCG (1000 UNIT) PO TABS: 6000.0000 [IU] | ORAL_TABLET | Freq: Every day | ORAL | Status: DC

## 2024-08-03 NOTE — Progress Notes (Signed)
 Progress Note   Patient: [name] FMW:[id number] DOB: 07-Jul-1981 DOA: 08/02/2024     1 DOS: the patient was seen and examined on 08/03/2024   Brief hospital course: [name] is a 44 y.o. female with medical history significant for Gettleman syndrome, with hypokalemia, hypomagnesemia, status post PPM, RSD, cerebral palsy and chronic back pain, who presented to the emergency room with acute onset of recurrent falls.  The patient has been feeling generally weak.  She has been having diminished p.o. intake.  She admits to dysuria as well as urinary frequency and urgency.  She has been having nausea without vomiting.  She admits to chills but did not have a measured fever.  No cough or wheezing or dyspnea.  No chest pain or palpitations.  She denied any presyncope or syncope or head injuries.   Assessment and Plan: * Frequent falls - This is likely secondary to generalized weakness associated with hypokalemia, hypomagnesemia and UTI. Patient feels that she is back to her baseline with respect to her strength.  She generally walks on her knees at home and uses a wheelchair -Follow-up PT -- Electrolyte supplementation per below.  Acute lower UTI, cystitis  Patient refusing IV antibiotics.  She is status post one-time dose of Levaquin . Will give patient a one-time dose of fosfomycin  H/o Gettleman Syndrome Hypokalemia  Hypomag  She follows with nephrology outpatient. HH with IV magnesium  infusions was ordered.  Unclear if patient regularly takes PO supplementation.   Replace potassium and magnesium  Discussing with CM to see if weekly labs and infusions can be arranaged, appreciate their assistance.   Cerebral palsy Spastic, Severe dysarthria.    Thrombocytopenia  Mild normocytic anemia     Latest Ref Rng & Units 08/03/2024    5:11 AM 08/02/2024    4:34 AM 08/01/2024   11:20 PM  CBC  WBC 4.0 - 10.5 K/uL 4.7  5.3  5.2   Hemoglobin 12.0 - 15.0 g/dL 89.2  88.8  87.7    Hematocrit 36.0 - 46.0 % 32.5  34.1  36.9   Platelets 150 - 400 K/uL 113  115  130    Mild possible in the setting of infection.  B12 low 171  noted.  Replace B12 Follow-up folate and iron studies  Vitamin D  deficiency  Supplement   BMI 16  Underweight  Consult RD      Subjective: Patient feels that her right hip pain is improving and that her generalized weakness is also improving with electrolytes supplementation.  Physical Exam: Vitals:   08/03/24 1100 08/03/24 1443 08/03/24 1455 08/03/24 1457  BP: 122/67 112/78 (!) 137/115 111/74  Pulse:  92 73 83  Resp:  18 19 18   Temp:   98.6 F (37 C) 98.6 F (37 C)  TempSrc:      SpO2:  100% 96%   Weight:      Height:       Physical Exam  Constitutional: In no distress. Thin  Cardiovascular: Normal rate, regular rhythm. No lower extremity edema  Pulmonary: Non labored breathing on room air, no wheezing or rales.  No lower extremity edema Abdominal: Soft. Normal bowel sounds. Non distended and non tender Musculoskeletal: Normal range of motion.     Neurological: Alert and oriented to person, place, and time. RLE 3/5 strength. Hip flexion and extension limited due to pain on R side. Significant dysarthria.  Skin: Skin is warm and dry.   Data Reviewed:     Latest Ref Rng &  Units 08/03/2024    5:11 AM 08/02/2024    4:34 AM 08/01/2024   11:20 PM  CBC  WBC 4.0 - 10.5 K/uL 4.7  5.3  5.2   Hemoglobin 12.0 - 15.0 g/dL 89.2  88.8  87.7   Hematocrit 36.0 - 46.0 % 32.5  34.1  36.9   Platelets 150 - 400 K/uL 113  115  130       Latest Ref Rng & Units 08/03/2024    5:11 AM 08/02/2024    4:34 AM 08/01/2024   11:20 PM  BMP  Glucose 70 - 99 mg/dL 71  891  882   BUN 6 - 20 mg/dL 16  20  21    Creatinine 0.44 - 1.00 mg/dL 9.17  9.12  9.06   Sodium 135 - 145 mmol/L 140  140  143   Potassium 3.5 - 5.1 mmol/L 3.4  3.9  2.8   Chloride 98 - 111 mmol/L 106  105  103   CO2 22 - 32 mmol/L 20  24  28    Calcium  8.9 - 10.3 mg/dL 8.9  9.2  9.9       Family Communication: Aunt at bedside, Mother via phone   Disposition: Status is: Inpatient Remains inpatient appropriate because: significant electrolyte abnl   Planned Discharge Destination: Home with Home Health    Time spent: 35 minutes  Author: Alban Pepper, MD 08/03/2024 5:53 PM  For on call review [email or web address].

## 2024-08-03 NOTE — ED Notes (Signed)
 Patient states she is refusing all care.

## 2024-08-03 NOTE — ED Notes (Signed)
 Patient refusing to have temperature taken at this time.

## 2024-08-03 NOTE — ED Notes (Addendum)
 Pt refused 0.9% NS with 20meq fluids. Pt states it upsets her stomach. States she wants some LR fluids order instead. Erminio Cone, Nurse practioner aware

## 2024-08-03 NOTE — ED Notes (Addendum)
 Pt yelling from room, I'm bleeding. Upon RN entering room pt's IV barely in and pt holding arm up. Blood on sheets of bed and arm. Pt's arm cleaned. Pt refusing for RN to change sheets at this time. Towel placed over blood soaked linen. Pt also has pulled off pulse ox and refusing for RN to put them back on.   This RN explaining the importance of having IV access due to critical lab values. Pt refusing another IV and stating only wants medication PO. Pt further stating, I want to go home. This RN encouraging pt to stay until care is complete. Admission MD made aware.

## 2024-08-03 NOTE — ED Notes (Signed)
 Social worker at bedside.

## 2024-08-03 NOTE — ED Notes (Signed)
 Pt requesting to leave AMA. Family unsuccessful at attempting to get pt to stay. Admission MD will come to bedside

## 2024-08-03 NOTE — ED Notes (Signed)
 Called CCMD back and pt is now placed back on their heart monitoring system since pt is agreeable to keep hear leads on

## 2024-08-03 NOTE — ED Notes (Signed)
 Family at bedside.

## 2024-08-03 NOTE — ED Notes (Signed)
 This RN and Alexis, NA helped pt onto the bedpan. We helped pt into a new gown. Pt now resting comfortably.

## 2024-08-03 NOTE — Progress Notes (Signed)
 PT Cancellation Note  Patient Details Name: [name] MRN: [id number] DOB: Nov 05, 1981   Cancelled Treatment:    Reason Eval/Treat Not Completed: Other (comment).  PT consult received.  Chart reviewed.  Attempted to initiate eval (pt reports using power w/c or walks on knees typically at home; pt's speech difficult to understand; family present). TOC arrived and needing to talk with pt so deferred attempted eval.  Will re-attempt PT evaluation at a later date/time.  Damien Caulk, PT 08/03/24, 2:08 PM

## 2024-08-03 NOTE — TOC CM/SW Note (Signed)
..  Transition of Care [facility]) - Inpatient Brief Assessment   Patient Details  Name: [name] MRN: [id number] Date of Birth: 17-Aug-1981  [facility]) CM/SW Contact:    Edsel DELENA Fischer, LCSW Phone Number: 08/03/2024, 2:46 PM   Clinical Narrative:  SW met with pt at bedside.  Pt aunt was present as well.  Pt was hesitant to stay in hospital due to discomfort and wanting to take meds at home.  SW expressed to pt that staff will do what we can to keep her comfortable and that staff will continue the meds she take at home as scheduled in the ED. Pt agreed to stay to continue infustion treatment.  Pt will need assistance with transportation.  Pt normally uses electric chair however it is at home.  Pt lives with her mom and continues her independence in the home.  Even though its difficult to understand what pt is saying,she is able to spell things out, speak slower and has access to her phone to provide needed info  SW reached out to Madie Favor PCP for [facility] referral.  PCP would like to see pt first.  Appt was already made for this Monday, August 25 @ 1:30pm.  SW expressed that pt will need [facility] referral to [facility] by PCP.  SW expressed that [facility] does not accept referrals for our hospital and that is why sw is reaching out to PCP to make referral to [facility].  SW expressed that pt will neeed IV magneisum infusions and weekly magneiusm and potassium check per MD provider. SW messaged Rikki Essex, RN and Aloysius Ford, RN for potential referral once pt discharges.       Transition of Care Asessment:

## 2024-08-03 NOTE — ED Notes (Signed)
 Patient refusing to take levaquin .  Patient wanting to go home.  Patient states ill take it up with my PCP on Monday.

## 2024-08-03 NOTE — Care Management Important Message (Signed)
 Important Message  Patient Details  Name: [name] MRN: [id number] Date of Birth: Jan 11, 1981   Important Message Given:  Yes - Medicare IM     Rojelio SHAUNNA Rattler 08/03/2024, 4:33 PM

## 2024-08-03 NOTE — ED Notes (Signed)
 Pt incontinent of urine. Pt cleaned and placed in fresh gown and socks. Pt refusing to wear brief at this time.

## 2024-08-03 NOTE — ED Notes (Addendum)
 Float RN at bedside - pt refused K+ packet, fosfomycin and lactulose . Pt continues to pull off pulse ox

## 2024-08-04 ENCOUNTER — Other Ambulatory Visit: Payer: Self-pay

## 2024-08-04 DIAGNOSIS — N158 Other specified renal tubulo-interstitial diseases: Secondary | ICD-10-CM

## 2024-08-04 DIAGNOSIS — R296 Repeated falls: Secondary | ICD-10-CM | POA: Diagnosis not present

## 2024-08-04 DIAGNOSIS — E876 Hypokalemia: Secondary | ICD-10-CM | POA: Diagnosis not present

## 2024-08-04 DIAGNOSIS — N3090 Cystitis, unspecified without hematuria: Secondary | ICD-10-CM | POA: Diagnosis not present

## 2024-08-04 LAB — IRON AND TIBC
Iron: 69 ug/dL (ref 28–170)
Saturation Ratios: 21 % (ref 10.4–31.8)
TIBC: 326 ug/dL (ref 250–450)
UIBC: 257 ug/dL

## 2024-08-04 LAB — FOLATE: Folate: 31 ng/mL (ref >5.9)

## 2024-08-04 LAB — BASIC METABOLIC PANEL WITH GFR
Anion gap: 13 (ref 5–15)
BUN: 15 mg/dL (ref 6–20)
CO2: 20 mmol/L — ABNORMAL LOW (ref 22–32)
Calcium: 9.4 mg/dL (ref 8.9–10.3)
Chloride: 106 mmol/L (ref 98–111)
Creatinine, Ser: 0.8 mg/dL (ref 0.44–1.00)
GFR, Estimated: >60 mL/min (ref >60)
Glucose, Bld: 70 mg/dL (ref 70–99)
Potassium: 3.5 mmol/L (ref 3.5–5.1)
Sodium: 139 mmol/L (ref 135–145)

## 2024-08-04 LAB — CBC
HCT: 35.7 % — ABNORMAL LOW (ref 36.0–46.0)
Hemoglobin: 12 g/dL (ref 12.0–15.0)
MCH: 26.6 pg (ref 26.0–34.0)
MCHC: 33.6 g/dL (ref 30.0–36.0)
MCV: 79.2 fL — ABNORMAL LOW (ref 80.0–100.0)
Platelets: 137 K/uL — ABNORMAL LOW (ref 150–400)
RBC: 4.51 MIL/uL (ref 3.87–5.11)
RDW: 14.5 % (ref 11.5–15.5)
WBC: 4.4 K/uL (ref 4.0–10.5)
nRBC: 0 % (ref 0.0–0.2)

## 2024-08-04 LAB — FERRITIN: Ferritin: 36 ng/mL (ref 11–307)

## 2024-08-04 LAB — MAGNESIUM: Magnesium: 1.4 mg/dL — ABNORMAL LOW (ref 1.7–2.4)

## 2024-08-04 LAB — PHOSPHORUS: Phosphorus: 3.5 mg/dL (ref 2.5–4.6)

## 2024-08-04 MED ORDER — VITAMIN B-12 1000 MCG PO TABS: 1000.0000 ug | ORAL_TABLET | Freq: Every day | ORAL | 0 refills | Status: AC

## 2024-08-04 MED ORDER — VITAMIN B-12 1000 MCG PO TABS: 1000.0000 ug | ORAL_TABLET | Freq: Every day | ORAL | Status: DC

## 2024-08-04 MED ORDER — ADULT MULTIVITAMIN W/MINERALS CH: 1.0000 | ORAL_TABLET | Freq: Every day | ORAL | Status: DC

## 2024-08-04 MED ORDER — D2000 ULTRA STRENGTH 50 MCG (2000 UT) PO CAPS: 2000.0000 [IU] | ORAL_CAPSULE | Freq: Every day | ORAL | 0 refills | Status: AC

## 2024-08-04 MED ORDER — POTASSIUM CHLORIDE CRYS ER 20 MEQ PO TBCR: 20.0000 meq | EXTENDED_RELEASE_TABLET | Freq: Every day | ORAL | 0 refills | Status: AC

## 2024-08-04 MED ORDER — POTASSIUM CHLORIDE CRYS ER 20 MEQ PO TBCR
20.0000 meq | EXTENDED_RELEASE_TABLET | Freq: Every day | ORAL | 0 refills | Status: DC
  Filled 2024-08-04: qty 30, 30d supply, fill #0

## 2024-08-04 MED ORDER — SENNA 8.6 MG PO TABS
1.0000 | ORAL_TABLET | Freq: Every day | ORAL | 0 refills | Status: DC
Start: 2024-08-04 — End: 2024-08-04
  Filled 2024-08-04: qty 30, 30d supply, fill #0

## 2024-08-04 MED ORDER — MAGNESIUM GLUCONATE 500 (27 MG) MG PO TABS: 500.0000 mg | ORAL_TABLET | Freq: Every day | ORAL | 0 refills | Status: AC

## 2024-08-04 MED ORDER — MAGNESIUM GLUCONATE 500 (27 MG) MG PO TABS
500.0000 mg | ORAL_TABLET | Freq: Every day | ORAL | Status: DC
  Filled 2024-08-04: qty 1

## 2024-08-04 MED ORDER — MAGNESIUM SULFATE 4 GM/100ML IV SOLN
4.0000 g | Freq: Once | INTRAVENOUS | Status: AC
  Administered 2024-08-04: 4 g via INTRAVENOUS
  Filled 2024-08-04: qty 100

## 2024-08-04 MED ORDER — VITAMIN B-12 1000 MCG PO TABS
1000.0000 ug | ORAL_TABLET | Freq: Every day | ORAL | 0 refills | Status: DC
  Filled 2024-08-04: qty 30, 30d supply, fill #0

## 2024-08-04 MED ORDER — POLYETHYLENE GLYCOL 3350 17 G PO PACK: 17.0000 g | PACK | Freq: Every day | ORAL | 0 refills | Status: AC

## 2024-08-04 MED ORDER — ADULT MULTIVITAMIN W/MINERALS CH
1.0000 | ORAL_TABLET | Freq: Every day | ORAL | 0 refills | Status: DC
  Filled 2024-08-04: qty 30, 30d supply, fill #0

## 2024-08-04 MED ORDER — THERA VITAL M PO TABS: 1.0000 | ORAL_TABLET | Freq: Every day | ORAL | 0 refills | Status: AC

## 2024-08-04 MED ORDER — NITROFURANTOIN MONOHYD MACRO 100 MG PO CAPS: 100.0000 mg | ORAL_CAPSULE | Freq: Two times a day (BID) | ORAL | 0 refills | Status: AC

## 2024-08-04 MED ORDER — MAGNESIUM GLUCONATE 500 (27 MG) MG PO TABS
500.0000 mg | ORAL_TABLET | Freq: Every day | ORAL | 0 refills | Status: DC
  Filled 2024-08-04: qty 30, 30d supply, fill #0

## 2024-08-04 MED ORDER — ADULT MULTIVITAMIN W/MINERALS CH
1.0000 | ORAL_TABLET | Freq: Every day | ORAL | 0 refills | Status: DC
Start: 2024-08-05 — End: 2024-08-04

## 2024-08-04 MED ORDER — POLYETHYLENE GLYCOL 3350 17 G PO PACK
17.0000 g | PACK | Freq: Every day | ORAL | 0 refills | Status: DC
  Filled 2024-08-04: qty 30, 30d supply, fill #0

## 2024-08-04 MED ORDER — SENNA 8.6 MG PO TABS: 1.0000 | ORAL_TABLET | Freq: Every day | ORAL | 0 refills | Status: AC

## 2024-08-04 MED ORDER — POTASSIUM CHLORIDE CRYS ER 20 MEQ PO TBCR
40.0000 meq | EXTENDED_RELEASE_TABLET | Freq: Every day | ORAL | Status: DC
  Administered 2024-08-04: 40 meq via ORAL
  Filled 2024-08-04: qty 2

## 2024-08-04 MED ORDER — D2000 ULTRA STRENGTH 50 MCG (2000 UT) PO CAPS
2000.0000 [IU] | ORAL_CAPSULE | Freq: Every day | ORAL | 0 refills | Status: DC
  Filled 2024-08-04: qty 30, 30d supply, fill #0

## 2024-08-04 NOTE — TOC CM/SW Note (Addendum)
..  Transition of Care [facility]) - Inpatient Brief Assessment   Patient Details  Name: [name] MRN: [id number] Date of Birth: [birth year]  [facility]) CM/SW Contact:    Edsel DELENA Fischer, LCSW Phone Number: 08/04/2024, 11:55 AM   Clinical Narrative:  SW spoke with pt today at bedside.  SW reached out to MD.  Pt will be discharging today at 12pm.  SW spoke with pt mom with pt and expressed to that pt has appointment on Monday with PCP.  SW expressed that PCP will make referral to [facility] for [facility].  SW expressed that pt would benefit from having an aide in the home for extra support.  Both pt and mom agreed.  SW reached out to [facility] to arrange transportation.  [facility] took pt info and stated that contact [employer] to retrieve authorization for payment. SW contacted [employer] and was told that pt account has been deactivated and that pt would need to contact Medicaid.  SW wanted to utilized ambulance services due to pt mobility issues.  SW contacted DSS and left message.  SW contact Zach, regarding possible alternatives to get pt home today.  TOC Shanea Lining stated that she had already arranged pt transportation for discharge today. SW made official referral to Rikki Essex, RN and Aloysius Ford, RN to follow pt once discharge. SW message DSS APS regarding contact for CAP/DA.  SW messaged [facility]CS support regarding steps to refer pt to program  Transition of Care Asessment:

## 2024-08-04 NOTE — Evaluation (Signed)
 Physical Therapy Evaluation Patient Details Name: [name] MRN: [id number] DOB: 12/13/1981 Today's Date: 08/04/2024  History of Present Illness  Pt is a 43 y.o. female presenting to hospital 08/01/24 with c/o multiple falls; c/o R hip injury.  Pt admitted with frequent falls, acute lower UTI, hypokalemia, and hypomagnesemia.  PMH includes spastic cerebral palsy, Gitelman syndrome, chronic hypokalemia and hypomagnesemia, POTS, s/p pacemaker, carpal tunnel syndrome, chronic back pain, RSD R foot  Clinical Impression  Prior to recent medical concerns, pt reports either walking on her knees or using power w/c; transfers on her own; lives with her mom in 1 level home with ramp to enter.  Chronic LBP reported (nurse notified of pt's request for pain medication).  Difficult to understand pt's speech consistently; pt gave therapist permission to call her mom but no one answered when therapist attempted to call her mom.  Currently pt is CGA to go from bed to recliner (pt turns/rolls onto her stomach in bed and controlled slides out of bed onto her knees, turns towards the recliner (with B UE support), pulls up on recliner with B UE's and pushes up with B LEs, and then turns/pivots to sit into recliner (pt reports this is how she typically transfers).  Pt would currently benefit from skilled PT to address noted impairments and functional limitations (see below for any additional details).  Upon hospital discharge, pt would benefit from ongoing therapy.     If plan is discharge home, recommend the following: A little help with walking and/or transfers;A little help with bathing/dressing/bathroom;Assistance with cooking/housework;Assist for transportation;Help with stairs or ramp for entrance   Can travel by private vehicle        Equipment Recommendations None recommended by PT  Recommendations for Other Services       Functional Status Assessment Patient has had a recent decline in their  functional status and demonstrates the ability to make significant improvements in function in a reasonable and predictable amount of time.     Precautions / Restrictions Precautions Precautions: Fall Recall of Precautions/Restrictions: Intact Restrictions Weight Bearing Restrictions Per Provider Order: No      Mobility  Bed Mobility Overal bed mobility: Needs Assistance Bed Mobility:  (Supine to tall kneeling)     Supine to sit: Supervision, HOB elevated, Used rails (Supine to tall kneeling on floor (towel placed under pt's knees for comfort))     General bed mobility comments: mild increased effort/time to perform on own (pt turning to slide off bed on R side in order to get onto knees--tall kneeling-- so pt did not get in sitting position    Transfers Overall transfer level: Needs assistance Equipment used: None Transfers: Bed to chair/wheelchair/BSC             General transfer comment: Pt controlled slid off of bed to come to tall kneeling and then turned to hold onto recliner and pulled herself up onto recliner with B UE's (pushing with B feet/LE's); CGA for safety    Ambulation/Gait               General Gait Details: Deferred (pt appearing non-ambulatory)  Stairs            Wheelchair Mobility     Tilt Bed    Modified Rankin (Stroke Patients Only)       Balance Overall balance assessment: Needs assistance Sitting-balance support: No upper extremity supported, Feet supported, Bilateral upper extremity supported Sitting balance-Leahy Scale: Fair Sitting balance - Comments: steady static  sitting in recliner       Standing balance comment: pt did not fully stand during session                             Pertinent Vitals/Pain Pain Assessment Pain Assessment: Faces Faces Pain Scale: Hurts little more Pain Location: chronic LBP Pain Descriptors / Indicators: Discomfort Pain Intervention(s): Limited activity within  patient's tolerance, Monitored during session, Repositioned, Patient requesting pain meds-RN notified    Home Living Family/patient expects to be discharged to:: Private residence Living Arrangements: Parent (Pt's mom) Available Help at Discharge: Family Type of Home: House Home Access: Ramped entrance       Home Layout: One level Home Equipment: Grab bars - tub/shower;Grab bars - toilet;Wheelchair - power;BSC/3in1      Prior Function Prior Level of Function : Needs assist             Mobility Comments: Pt walks on her knees at baseline or uses power w/c.  Uses BSC in hallway at home.       Extremity/Trunk Assessment   Upper Extremity Assessment Upper Extremity Assessment: Generalized weakness (h/o CP; limited assessment d/t TOC arrived to talk with pt)    Lower Extremity Assessment Lower Extremity Assessment: Generalized weakness (h/o CP; limited assessment d/t TOC arrived to talk with pt)       Communication   Communication Communication: Impaired Factors Affecting Communication: Difficulty expressing self (Speech not clear)    Cognition Arousal: Alert Behavior During Therapy: WFL for tasks assessed/performed   PT - Cognitive impairments: Difficult to assess Difficult to assess due to: Impaired communication                     PT - Cognition Comments: Pt appearing at least oriented to self Following commands: Intact       Cueing Cueing Techniques: Verbal cues, Tactile cues     General Comments General comments (skin integrity, edema, etc.): Calluses noted R>L knees (pt reports she has knee pads at home but doesn't wear them d/t not staying in place).  Nursing cleared pt for participation in physical therapy.  Pt agreeable to PT session.    Exercises     Assessment/Plan    PT Assessment Patient needs continued PT services  PT Problem List Decreased strength;Decreased mobility;Decreased balance;Pain       PT Treatment Interventions DME  instruction;Functional mobility training;Therapeutic activities;Therapeutic exercise;Balance training;Patient/family education    PT Goals (Current goals can be found in the Care Plan section)  Acute Rehab PT Goals Patient Stated Goal: to go home today PT Goal Formulation: With patient Time For Goal Achievement: 08/18/24 Potential to Achieve Goals: Good    Frequency Min 2X/week     Co-evaluation               AM-PAC PT 6 Clicks Mobility  Outcome Measure Help needed turning from your back to your side while in a flat bed without using bedrails?: None Help needed moving from lying on your back to sitting on the side of a flat bed without using bedrails?: A Little Help needed moving to and from a bed to a chair (including a wheelchair)?: A Little Help needed standing up from a chair using your arms (e.g., wheelchair or bedside chair)?: Total Help needed to walk in hospital room?: Total Help needed climbing 3-5 steps with a railing? : Total 6 Click Score: 13    End of Session  Activity Tolerance: Patient tolerated treatment well Patient left: in chair;with call bell/phone within reach;with chair alarm set;Other (comment) (TOC present) Nurse Communication: Mobility status;Precautions;Patient requests pain meds PT Visit Diagnosis: Other abnormalities of gait and mobility (R26.89);Muscle weakness (generalized) (M62.81);History of falling (Z91.81);Pain Pain - part of body:  (LBP)    Time: 9173-9151 PT Time Calculation (min) (ACUTE ONLY): 22 min   Charges:   PT Evaluation $PT Eval Low Complexity: 1 Low   PT General Charges $$ ACUTE PT VISIT: 1 Visit        Miraya Cudney, PT 08/04/24, 9:26 AM

## 2024-08-04 NOTE — Plan of Care (Signed)

## 2024-08-04 NOTE — TOC Transition Note (Signed)
 Transition of Care [facility]) - Discharge Note   Patient Details  Name: [name] MRN: [id number] Date of Birth: 11-Jan-1981  [facility]) CM/SW Contact:  Marinda Cooks, RN Phone Number: 08/04/2024, 12:38 PM   Clinical Narrative:     This CM updated by covering MD pt medically cleared to dc today and has active DC order . DC transportation confirmed for pt with [facility] team updated . No additional DC needs requested by medical team or identified by CM at this time.    Final next level of care: Home/Self Care Barriers to Discharge: No Barriers Identified   Discharge Placement     Home with Family              Name of family member notified: Pt's MotherTammy Servantes [phone] Patient and family notified of of transfer: 08/04/24  Discharge Plan and Services Additional resources added to the After Visit Summary for        Social Drivers of Health (SDOH) Interventions SDOH Screenings   Food Insecurity: No Food Insecurity (08/03/2024)  Housing: Low Risk  (08/03/2024)  Recent Concern: Housing - High Risk (06/13/2024)   [facility] from [facility]  Transportation Needs: No Transportation Needs (08/03/2024)  Recent Concern: Transportation Needs - Unmet Transportation Needs (06/13/2024)   Received from [facility]  Utilities: Not At Risk (08/03/2024)  Financial Resource Strain: Low Risk  (06/13/2024)   Received from [facility]  Social Connections: Socially Isolated (05/07/2024)  Stress: Stress Concern Present (07/20/2022)   Received from [facility]  Tobacco Use: Low Risk  (08/03/2024)     Readmission Risk Interventions     No data to display

## 2024-08-04 NOTE — Progress Notes (Addendum)
 Initial Nutrition Assessment  DOCUMENTATION CODES:   Underweight  INTERVENTION:   Recommend consideration of G-tube placement and nutrition support  Ensure Enlive po BID, each supplement provides 350 kcal and 20 grams of protein.  Magic cup TID with meals, each supplement provides 290 kcal and 9 grams of protein  MVI po daily   Pt at high refeed risk; recommend monitor potassium, magnesium  and phosphorus labs daily until stable  Dysphagia 3 diet   Daily weights   Cholecalciferol  6000 units po daily per MD  Cyanocobalamin  1,000 mcg po daily   NUTRITION DIAGNOSIS:   Inadequate oral intake related to acute illness as evidenced by per patient/family report.  GOAL:   Patient will meet greater than or equal to 90% of their needs  MONITOR:   PO intake, Supplement acceptance, Labs, Weight trends, Skin, I & O's  REASON FOR ASSESSMENT:   Consult Assessment of nutrition requirement/status  ASSESSMENT:   43 y/o female with h/o HTN, cerebral palsy, GERD, thrombocytopenia, spondylolysis, IBS, POTS, SA node dysfunction s/p pacemaker, RSD, Gitelman syndrome, chronic constipation, DVT and numerous admissions for electrolyte disturbances with falls who is now admitted with UTI, cystitis, falls and electrolyte disturbances.  RD working remotely.  Pt is well known to nutrition department and this RD from numerous previous admissions. Pt with decreased appetite and oral intake at baseline. Pt does generally eat fair in hospital but suspect poor oral intake at home as pt with chronic malnutrition, vitamin deficiencies and frequent admissions for weaknesses and falls with electrolytes disturbances. Pt will intermittently drink strawberry Ensure in hospital. Pt does like Magic Cups. Pt with chronic constipation and abdominal pain. Pt is at high risk for gastroparesis secondary to her POTS. Pt would likely benefit from G-tube placement and nutrition support; RD unsure if patient would be  accepting of a feeding tube. Pt is currently refusing everything in hospital. Pt's nutritional status discussed with MD. RD will add supplements and vitamin supplementation to help pt meet her estimated needs. Pt is at high refeed risk. Per chart, pt is weight stable at baseline. RD unable to diagnose malnutrition at this time at NFPE cannot be performed. RD will obtain exam and history at follow up.   Medications reviewed and include: D3, B12, lactulose , Mg gluconate, protonix , KCl, carafate , Mg sulfate  Labs reviewed: K 3.5 wnl, Mg 1.4(L) Iron 69, TIBC 326, ferritin 36, folate 31.0, B12 171(L), vitamin D  20.30(L)- 8/23  UOP-   Diet Order:    Diet Order             Diet Heart Room service appropriate? Yes; Fluid consistency: Thin  Diet effective now                  EDUCATION NEEDS:   Not appropriate for education at this time  Skin:  Skin Assessment: Reviewed RN Assessment  Last BM:  pta  Height:   Ht Readings from Last 1 Encounters:  08/01/24 5' 2 (1.575 m)    Weight:   Wt Readings from Last 1 Encounters:  08/01/24 41.6 kg    Ideal Body Weight:  50 kg  BMI:  Body mass index is 16.77 kg/m.  Estimated Nutritional Needs:   Kcal:  1400-1600kcal/day  Protein:  70-80g/day  Fluid:  1.3-1.5L/day  Augustin Shams MS, RD, LDN Please refer to [facility] for RD and/or RD on-call/weekend/after hours pager

## 2024-08-05 NOTE — Discharge Summary (Signed)
 Physician Discharge Summary   Patient: [name] MRN: [id number] DOB: [birth year]  Admit date:     08/02/2024  Discharge date: 08/04/2024  Discharge Physician: Alban Pepper   PCP: Eliverto Bette Hover, MD   Recommendations at discharge:    F/u PCP for BMP to check potassium and magnesium . Ensure getting home IV magnesium  infusion F/u vit d and b12 levels  Discharge Diagnoses: Principal Problem:   Frequent falls Active Problems:   Hypokalemia   Acute lower UTI   Hypomagnesemia  Resolved Problems:   * No resolved hospital problems. *  Hospital Course: [name] is a 43 y.o. female with medical history significant for Gettleman syndrome, with hypokalemia, hypomagnesemia, status post PPM, RSD, cerebral palsy and chronic back pain, who presented to the emergency room with acute onset of recurrent falls.  The patient has been feeling generally weak.  She has been having diminished p.o. intake.  She admits to dysuria as well as urinary frequency and urgency.  She has been having nausea without vomiting.  She admits to chills but did not have a measured fever.  No cough or wheezing or dyspnea.  No chest pain or palpitations.  She denied any presyncope or syncope or head injuries.   Assessment and Plan: * Frequent falls - This is likely secondary to generalized weakness associated with hypokalemia, hypomagnesemia and UTI. Patient follows with nephrology at [facility] and a referral was sent for [facility] IV magnesium  infusions given patient's lack of transportation.  Patient states that she also takes oral potassium and magnesium  most of the time.  Patient had some discomfort in her R hip. Xray of R hip was negative for fracture. By the day of discharge patient felt that she was back to her baseline strength.   She will follow up with her PCP to figure out a plan for frequent electrolyte checks.   Acute lower UTI Patient refused her second dose of levaquin  and fosfomycin. A prescription  for macrobid  was sent to her home pharmacy.    Gitelman Syndrome  Hypokalemia Hypomagnesemia Patient intermittently refused treatment. But was able to get ~6g of IV magnesium . She was also able to take potassium tablets. Prescriptions for these were sent to her pharmacy. She states that she has magnesium  lactate and potassium tablets at home and will take these daily.   Chronic constipation Patient refused lactulose  to help with bowel movements    Vit D def Vit B12 def Patient refused to take these medications. Prescriptions were sent to her pharmacy.   Thrombocytopenia  Normocytic Anemia     Latest Ref Rng & Units 08/04/2024    6:17 AM 08/03/2024    5:11 AM 08/02/2024    4:34 AM  CBC  WBC 4.0 - 10.5 K/uL 4.4  4.7  5.3   Hemoglobin 12.0 - 15.0 g/dL 87.9  89.2  88.8   Hematocrit 36.0 - 46.0 % 35.7  32.5  34.1   Platelets 150 - 400 K/uL 137  113  115    In the setting of vitamin deficiencies. Transfusion not warranted. Replace vitamins.  Cerebral palsy  Dysarthric speech and spasticity of limbs and truncal muscles as well.   Severe protein calorie malnutrition  RD consulted. Patient has to cook for herself at home and this may be challenging. She eats well when hospitalized      Consultants: RD Procedures performed: None  Disposition: Home Diet recommendation:  Regular diet DISCHARGE MEDICATION: Allergies as of 08/04/2024  Reactions   Duloxetine    Other reaction(s): Other (See Comments), Palpitations Other reaction(s): Hallucinations, Hyperactive behavior (finding), Other (See Comments) Stressed out   Flecainide    Other reaction(s): Other (See Comments) Other reaction(s): Hypomagnesemia (disorder), Other (See Comments) Other Reaction: QUESTION HYPOMAGNESEMIA   Fluoxetine    Other reaction(s): Nausea And Vomiting, Other (See Comments) Other reaction(s): Other (See Comments) Other Reaction: INCREASED HR  causes irritability and irrational behavior I go  crazy   Gabapentin    Goes crazy Other reaction(s): Other (See Comments) Other reaction(s): Hyperactive behavior (finding), Mental Status Changes (intolerance), Other (See Comments) Goes crazy Hallucinations   Baclofen    Other reaction(s): Other (See Comments), Other (See Comments) Weakness  Other reaction(s): Other (See Comments) Weakness and loose Weakness  Other reaction(s):  Weakness    Cephalosporins    Other reaction(s): Hives, Rash Received pre-op vancomycin and cefazolin 07/20/16. Developed urticarial rash on bilateral lower extremities + slight periorbital swelling. Treated with diphenhydramine . Received pre-op vancomycin and cefazolin 07/20/16. Developed urticarial rash on bilateral lower extremities + slight periorbital swelling. Treated with diphenhydramine . Received pre-op vancomycin and cefazolin 07/20/16. Developed urticarial rash on bilateral lower extremities + slight periorbital swelling. Treated with diphenhydramine .   Sulfasalazine    Other reaction(s): Nausea And Vomiting   Sulfa Antibiotics Nausea And Vomiting   Vancomycin    Other reaction(s): Hives Received pre-op vancomycin and cefazolin 07/20/16. Developed urticarial rash on bilateral lower extremities + slight periorbital swelling. Treated with diphenhydramine . Received pre-op vancomycin and cefazolin 07/20/16. Developed urticarial rash on bilateral lower extremities + slight periorbital swelling. Treated with diphenhydramine . Received pre-op vancomycin and cefazolin 07/20/16. Developed urticarial rash on bilateral lower extremities + slight periorbital swelling. Treated with diphenhydramine .   Cefazolin Rash   Tolerated ceftriaxone  2023 and cephalexin  2022        Medication List     STOP taking these medications    Mag-Oxide 200 MG Tabs Generic drug: Magnesium  Oxide -Mg Supplement       TAKE these medications    acetaminophen  325 MG tablet Commonly known as: TYLENOL  Take 2 tablets (650 mg total) by  mouth every 6 (six) hours as needed for mild pain (pain score 1-3) or fever.   cyanocobalamin  1000 MCG tablet Commonly known as: VITAMIN B12 Take 1 tablet (1,000 mcg total) by mouth daily.   D2000 Ultra Strength 50 MCG (2000 UT) Caps Generic drug: Cholecalciferol  Take 1 capsule (2,000 Units total) by mouth daily. What changed: how much to take   dapagliflozin  propanediol 10 MG Tabs tablet Commonly known as: FARXIGA  Take 1 tablet by mouth daily.   Eliquis  5 MG Tabs tablet Generic drug: apixaban  Take 5 mg by mouth 2 (two) times daily.   heparin  lock flush 100 UNIT/ML Soln injection Inject 5 mL (500 Units total) as directed as needed for line care. Flush using SASH (saline, administer  IV, saline, heparin ) method as directed. Flush IV catheter with heparin  after the last saline flush.  Use syringe ONE TIME only then discard. Storage: Room Temperature.   magnesium  gluconate 500 (27 Mg) MG Tabs tablet Commonly known as: MAGONATE Take 1 tablet (500 mg total) by mouth daily.   methocarbamol  750 MG tablet Commonly known as: ROBAXIN  Take 750 mg by mouth at bedtime as needed.   montelukast  10 MG tablet Commonly known as: SINGULAIR  Take 10 mg by mouth daily as needed (allergies).   multivitamin tablet Take 1 tablet by mouth daily.   nitrofurantoin  (macrocrystal-monohydrate) 100 MG capsule Commonly known  as: Macrobid  Take 1 capsule (100 mg total) by mouth 2 (two) times daily for 5 days.   pantoprazole  40 MG tablet Commonly known as: PROTONIX  Take 40 mg by mouth 2 (two) times daily.   polyethylene glycol 17 g packet Commonly known as: MiraLax  Take 17 g by mouth daily.   potassium chloride  SA 20 MEQ tablet Commonly known as: KLOR-CON  M Take 1 tablet (20 mEq total) by mouth daily.   pregabalin  150 MG capsule Commonly known as: LYRICA  Take 150 mg by mouth 2 (two) times daily.   Premarin  vaginal cream Generic drug: conjugated estrogens  Place 0.5 g vaginally 2 (two) times a  week.   promethazine  25 MG tablet Commonly known as: PHENERGAN  Take 12.5 mg by mouth every 8 (eight) hours as needed.   Ritalin  20 MG tablet Generic drug: methylphenidate  Take 20 mg by mouth 3 (three) times daily. Take 1 tablet (20 mg) TID at 0800, 1200 & 2100-2200   senna 8.6 MG Tabs tablet Commonly known as: SENOKOT Take 1 tablet (8.6 mg total) by mouth daily.   sucralfate  1 g tablet Commonly known as: CARAFATE  Take 1 g by mouth 4 (four) times daily.   traMADol  50 MG tablet Commonly known as: ULTRAM  Take 1 tablet by mouth every 12 (twelve) hours as needed.        Discharge Exam: Filed Weights   08/01/24 2315  Weight: 41.6 kg   Physical Exam  Constitutional: In no distress. Thin  Cardiovascular: Normal rate, regular rhythm. No lower extremity edema  Pulmonary: Non labored breathing on room air, no wheezing or rales.   Abdominal: Soft. Non distended and non tender Neurological: Alert and oriented to person, place, and time. Spastic movements diffusely. Severe dysarthric speech Skin: Skin is warm and dry.    Condition at discharge: stable  The results of significant diagnostics from this hospitalization (including imaging, microbiology, ancillary and laboratory) are listed below for reference.   Imaging Studies: DG Lumbar Spine Complete Result Date: 08/02/2024 CLINICAL DATA:  Pain after fall. EXAM: LUMBAR SPINE - COMPLETE 4+ VIEW COMPARISON:  05/08/2024. FINDINGS: 5 nonrib bearing lumbar-type vertebral bodies. Vertebral body heights are maintained. No acute fracture. No static listhesis. Similar multilevel degenerative disc changes with disc height loss and anterior endplate osteophytosis, most pronounced at L1-L2. SI joints are unremarkable. Partially evaluated spinal stimulator. IMPRESSION: 1. No evidence of acute fracture or traumatic listhesis of the lumbar spine. 2. Multilevel degenerative disc changes, most pronounced at L1-L2. Electronically Signed   By: Harrietta Sherry [name]   On: 08/02/2024 12:28   CT HEAD WO CONTRAST ( ) Result Date: 08/02/2024 CLINICAL DATA:  Fall, head trauma EXAM: CT HEAD WITHOUT CONTRAST TECHNIQUE: Contiguous axial images were obtained from the base of the skull through the vertex without intravenous contrast. RADIATION DOSE REDUCTION: This exam was performed according to the departmental dose-optimization program which includes automated exposure control, adjustment of the mA and/or kV according to patient size and/or use of iterative reconstruction technique. COMPARISON:  07/02/2024 FINDINGS: Brain: No acute intracranial abnormality. Specifically, no hemorrhage, hydrocephalus, mass lesion, acute infarction, or significant intracranial injury. Old left basal ganglia lacunar infarct. Vascular: No hyperdense vessel or unexpected calcification. Skull: No acute calvarial abnormality. Sinuses/Orbits: No acute findings Other: None IMPRESSION: No acute intracranial abnormality. Electronically Signed   By: Franky Crease [name]   On: 08/02/2024 03:10   DG Hip Unilat W or Wo Pelvis 2-3 Views Right Result Date: 08/01/2024 CLINICAL DATA:  Fall with right hip injury. EXAM:  DG HIP (WITH OR WITHOUT PELVIS) 2-3V RIGHT COMPARISON:  Similar study 07/02/2024. FINDINGS: Three views with AP pelvis and AP and frog-leg right hip. There is no evidence of hip fracture or dislocation. There is no evidence of arthropathy or significant focal bone abnormality. AP pelvis without evidence of pelvic fracture or diastasis. There R mild spurring changes at the SI joints and pubic symphysis. Moderate fecal stasis. IMPRESSION: 1. No evidence of fracture or dislocation. 2. Moderate fecal stasis. Electronically Signed   By: Francis Quam [name]   On: 08/01/2024 23:35    Microbiology: Results for orders placed or performed during the hospital encounter of 08/02/24  Urine Culture     Status: Abnormal   Collection Time: 08/02/24  2:39 AM   Specimen: Urine, Random  Result Value  Ref Range Status   Specimen Description   Final    URINE, RANDOM Performed at [facility], [address] [place], [place] [address]    Special Requests   Final    NONE Performed at [facility], [address] [place], [place] [address]    Culture MULTIPLE SPECIES PRESENT, SUGGEST RECOLLECTION (A)  Final   Report Status 08/03/2024 FINAL  Final    Labs: CBC: Recent Labs  Lab 08/01/24 2320 08/02/24 0434 08/03/24 0511 08/04/24 0617  WBC 5.2 5.3 4.7 4.4  HGB 12.2 11.1* 10.7* 12.0  HCT 36.9 34.1* 32.5* 35.7*  MCV 80.2 80.6 80.6 79.2*  PLT 130* 115* 113* 137*   Basic Metabolic Panel: Recent Labs  Lab 08/01/24 2320 08/02/24 0434 08/03/24 0511 08/04/24 0617  NA 143 140 140 139  K 2.8* 3.9 3.4* 3.5  CL 103 105 106 106  CO2 28 24 20* 20*  GLUCOSE 117* 108* 71 70  BUN 21* 20 16 15   CREATININE 0.93 0.87 0.82 0.80  CALCIUM  9.9 9.2 8.9 9.4  MG 1.3*  --  1.4* 1.4*  PHOS  --   --   --  3.5   Liver Function Tests: Recent Labs  Lab 08/01/24 2320  AST 19  ALT 15  ALKPHOS 82  BILITOT 0.7  PROT 7.1  ALBUMIN 4.3   CBG: No results for input(s): GLUCAP in the last 168 hours.  Discharge time spent: greater than 30 minutes.  Signed: Alban Pepper, MD [facility] 08/05/2024

## 2024-09-26 ENCOUNTER — Other Ambulatory Visit: Payer: Self-pay

## 2024-09-26 ENCOUNTER — Emergency Department
Admission: EM | Admit: 2024-09-26 | Discharge: 2024-09-26 | Disposition: A | Discharge status: Home / Self Care | Attending: Emergency Medicine | Admitting: Emergency Medicine

## 2024-09-26 DIAGNOSIS — R0789 Other chest pain: Secondary | ICD-10-CM | POA: Diagnosis present

## 2024-09-26 DIAGNOSIS — Z950 Presence of cardiac pacemaker: Secondary | ICD-10-CM | POA: Insufficient documentation

## 2024-09-26 DIAGNOSIS — R5381 Other malaise: Secondary | ICD-10-CM | POA: Insufficient documentation

## 2024-09-26 DIAGNOSIS — G809 Cerebral palsy, unspecified: Secondary | ICD-10-CM | POA: Insufficient documentation

## 2024-09-26 DIAGNOSIS — E876 Hypokalemia: Secondary | ICD-10-CM | POA: Insufficient documentation

## 2024-09-26 DIAGNOSIS — R5383 Other fatigue: Secondary | ICD-10-CM | POA: Diagnosis not present

## 2024-09-26 DIAGNOSIS — R531 Weakness: Secondary | ICD-10-CM | POA: Diagnosis not present

## 2024-09-26 LAB — CBC WITH DIFFERENTIAL/PLATELET
Abs Immature Granulocytes: 0.01 K/uL (ref 0.00–0.07)
Basophils Absolute: 0 K/uL (ref 0.0–0.1)
Basophils Relative: 0 %
Eosinophils Absolute: 0 K/uL (ref 0.0–0.5)
Eosinophils Relative: 0 %
HCT: 36.6 % (ref 36.0–46.0)
Hemoglobin: 12.6 g/dL (ref 12.0–15.0)
Immature Granulocytes: 0 %
Lymphocytes Relative: 34 %
Lymphs Abs: 1.1 K/uL (ref 0.7–4.0)
MCH: 26.8 pg (ref 26.0–34.0)
MCHC: 34.4 g/dL (ref 30.0–36.0)
MCV: 77.9 fL — ABNORMAL LOW (ref 80.0–100.0)
Monocytes Absolute: 0.1 K/uL (ref 0.1–1.0)
Monocytes Relative: 4 %
Neutro Abs: 2 K/uL (ref 1.7–7.7)
Neutrophils Relative %: 62 %
Platelets: 102 K/uL — ABNORMAL LOW (ref 150–400)
RBC: 4.7 MIL/uL (ref 3.87–5.11)
RDW: 14.9 % (ref 11.5–15.5)
WBC: 3.3 K/uL — ABNORMAL LOW (ref 4.0–10.5)
nRBC: 0 % (ref 0.0–0.2)

## 2024-09-26 LAB — URINALYSIS, W/ REFLEX TO CULTURE (INFECTION SUSPECTED)
Bilirubin Urine: NEGATIVE
Glucose, UA: NEGATIVE mg/dL
Ketones, ur: NEGATIVE mg/dL
Leukocytes,Ua: NEGATIVE
Nitrite: NEGATIVE
Protein, ur: NEGATIVE mg/dL
Specific Gravity, Urine: 1.011 (ref 1.005–1.030)
pH: 7 (ref 5.0–8.0)

## 2024-09-26 LAB — RESP PANEL BY RT-PCR (RSV, FLU A&B, COVID)  RVPGX2
Influenza A by PCR: NEGATIVE
Influenza B by PCR: NEGATIVE
Resp Syncytial Virus by PCR: NEGATIVE
SARS Coronavirus 2 by RT PCR: NEGATIVE

## 2024-09-26 LAB — PHOSPHORUS: Phosphorus: 3.6 mg/dL (ref 2.5–4.6)

## 2024-09-26 LAB — COMPREHENSIVE METABOLIC PANEL WITH GFR
ALT: 21 U/L (ref 0–44)
AST: 22 U/L (ref 15–41)
Albumin: 3.4 g/dL — ABNORMAL LOW (ref 3.5–5.0)
Alkaline Phosphatase: 73 U/L (ref 38–126)
Anion gap: 13 (ref 5–15)
BUN: 16 mg/dL (ref 6–20)
CO2: 30 mmol/L (ref 22–32)
Calcium: 9.2 mg/dL (ref 8.9–10.3)
Chloride: 99 mmol/L (ref 98–111)
Creatinine, Ser: 0.73 mg/dL (ref 0.44–1.00)
GFR, Estimated: >60 mL/min (ref >60)
Glucose, Bld: 106 mg/dL — ABNORMAL HIGH (ref 70–99)
Potassium: 2.7 mmol/L — CL (ref 3.5–5.1)
Sodium: 142 mmol/L (ref 135–145)
Total Bilirubin: 0.6 mg/dL (ref 0.0–1.2)
Total Protein: 6.5 g/dL (ref 6.5–8.1)

## 2024-09-26 LAB — MAGNESIUM: Magnesium: 1.1 mg/dL — ABNORMAL LOW (ref 1.7–2.4)

## 2024-09-26 MED ORDER — POTASSIUM CHLORIDE CRYS ER 20 MEQ PO TBCR
60.0000 meq | EXTENDED_RELEASE_TABLET | Freq: Once | ORAL | Status: DC
  Filled 2024-09-26: qty 3

## 2024-09-26 MED ORDER — CALCIUM CARBONATE ANTACID 500 MG PO CHEW
1.0000 | CHEWABLE_TABLET | Freq: Once | ORAL | Status: AC
  Administered 2024-09-26: 200 mg via ORAL
  Filled 2024-09-26: qty 1

## 2024-09-26 MED ORDER — FAMOTIDINE 20 MG PO TABS
40.0000 mg | ORAL_TABLET | Freq: Once | ORAL | Status: AC
  Administered 2024-09-26: 40 mg via ORAL
  Filled 2024-09-26: qty 2

## 2024-09-26 MED ORDER — SODIUM CHLORIDE 0.9 % IV BOLUS
500.0000 mL | Freq: Once | INTRAVENOUS | Status: AC
  Administered 2024-09-26: 500 mL via INTRAVENOUS

## 2024-09-26 NOTE — ED Triage Notes (Signed)
 BIBEMS from home. Midsternal chest pain that does not radiate, started at 3am today. Hx of hypomagnesemia and hypokalemia. VSS. Pt has a pacemaker.

## 2024-09-26 NOTE — ED Provider Notes (Signed)
 [facility] Provider Note    Event Date/Time   First MD Initiated Contact with Patient 09/26/24 1127     (approximate)   History   Chief Complaint: Chest Pain   HPI  [name] is a 43 y.o. female with cerebral palsy, chronic hypokalemia and chronic hypomagnesemia who comes ED complaining of generalized weakness, reports that she has felt fatigued for the past 3 weeks and has been mostly staying in bed during this time.  Also reports some mid chest discomfort which feels like tightness.  No radiation shortness of breath diaphoresis or vomiting.  No dizziness or syncope.  Does report that she has heartburn.        Past Medical History:  Diagnosis Date   Carpal tunnel syndrome    Cerebral palsy (HCC)    Chronic back pain    Gitelman syndrome    Hypokalemia    now resolved   Magnesium  deficiency    Pacemaker    PONV (postoperative nausea and vomiting)    Postural orthostatic tachycardia syndrome    s/p pacemaker placement.   POTS (postural orthostatic tachycardia syndrome)    RSD (reflex sympathetic dystrophy)    right foot,    RSD (reflex sympathetic dystrophy)    SA node dysfunction (HCC) 12/13/2006    Current Outpatient Rx   Order #: [id number] Class: OTC   Order #: [id number] Class: Normal   Order #: [id number] Class: Normal   Order #: [id number] Class: Historical Med   Order #: [id number] Class: Historical Med   Order #: [id number] Class: Normal   Order #: [id number] Class: Historical Med   Order #: [id number] Class: Historical Med   Order #: [id number] Class: Historical Med   Order #: [id number] Class: Normal   Order #: [id number] Class: Historical Med   Order #: [id number] Class: Normal   Order #: [id number] Class: Historical Med   Order #: [id number] Class: Historical Med   Order #: [id number] Class: Historical Med   Order #: [id number] Class: Normal   Order #: [id number] Class: Historical Med   Order #: [id number] Class: Historical Med    Past Surgical  History:  Procedure Laterality Date   ABDOMINAL HYSTERECTOMY Left 12/14/2011   Dr. Arloa   CHOLECYSTECTOMY     COLONOSCOPY WITH PROPOFOL  N/A 12/11/2022   Procedure: COLONOSCOPY WITH PROPOFOL ;  Surgeon: Unk Corinn Skiff, MD;  Location: [facility] ENDOSCOPY;  Service: Gastroenterology;  Laterality: N/A;   COLONOSCOPY WITH PROPOFOL  N/A 12/13/2022   Procedure: COLONOSCOPY WITH PROPOFOL ;  Surgeon: Unk Corinn Skiff, MD;  Location: [facility] ENDOSCOPY;  Service: Gastroenterology;  Laterality: N/A;   INSERT / REPLACE / REMOVE PACEMAKER     ORTHOPEDIC SURGERY     PACEMAKER INSERTION     PORTACATH PLACEMENT      Physical Exam   Triage Vital Signs: ED Triage Vitals  Encounter Vitals Group     BP 09/26/24 1121 109/85     Girls Systolic BP Percentile --      Girls Diastolic BP Percentile --      Boys Systolic BP Percentile --      Boys Diastolic BP Percentile --      Pulse Rate 09/26/24 1121 87     Resp 09/26/24 1121 14     Temp 09/26/24 1121 97.7 F (36.5 C)     Temp src --      SpO2 09/26/24 1121 100 %     Weight 09/26/24 1122 95 lb (43.1 kg)     Height 09/26/24 1122 5' 2 (1.575 m)     Head Circumference --  Peak Flow --      Pain Score 09/26/24 1122 6     Pain Loc --      Pain Education --      Exclude from Growth Chart --     Most recent vital signs: Vitals:   09/26/24 1121  BP: 109/85  Pulse: 87  Resp: 14  Temp: 97.7 F (36.5 C)  SpO2: 100%    General: Awake, no distress.  CV:  Good peripheral perfusion.  Regular rate rhythm Resp:  Normal effort.  Clear lungs Abd:  No distention.  Soft nontender Other:  Moist oral mucosa   ED Results / Procedures / Treatments   Labs (all labs ordered are listed, but only abnormal results are displayed) Labs Reviewed  COMPREHENSIVE METABOLIC PANEL WITH GFR - Abnormal; Notable for the following components:      Result Value   Potassium 2.7 (*)    Glucose, Bld 106 (*)    Albumin 3.4 (*)    All other components within normal  limits  CBC WITH DIFFERENTIAL/PLATELET - Abnormal; Notable for the following components:   WBC 3.3 (*)    MCV 77.9 (*)    Platelets 102 (*)    All other components within normal limits  MAGNESIUM  - Abnormal; Notable for the following components:   Magnesium  1.1 (*)    All other components within normal limits  URINALYSIS, W/ REFLEX TO CULTURE (INFECTION SUSPECTED) - Abnormal; Notable for the following components:   Color, Urine YELLOW (*)    APPearance CLEAR (*)    Hgb urine dipstick SMALL (*)    Bacteria, UA RARE (*)    All other components within normal limits  RESP PANEL BY RT-PCR (RSV, FLU A&B, COVID)  [id number]  PHOSPHORUS     EKG Interpreted by me Sinus rhythm rate of 87.  Right axis, normal intervals.  No acute ischemic changes.   RADIOLOGY    PROCEDURES:  Procedures   MEDICATIONS ORDERED IN ED: Medications  potassium chloride  SA (KLOR-CON  M) CR tablet 60 mEq (has no administration in time range)  sodium chloride  0.9 % bolus 500 mL (500 mLs Intravenous New Bag/Given 09/26/24 1210)  calcium  carbonate (TUMS - dosed in mg elemental calcium ) chewable tablet 200 mg of elemental calcium  (200 mg of elemental calcium  Oral Given 09/26/24 1235)  famotidine  (PEPCID ) tablet 40 mg (40 mg Oral Given 09/26/24 1235)     IMPRESSION / MDM / ASSESSMENT AND PLAN / ED COURSE  I reviewed the triage vital signs and the nursing notes.  DDx: Dehydration, anemia, AKI, electrolyte derangement, UTI, physical deconditioning  Patient's presentation is most consistent with acute presentation with potential threat to life or bodily function.  Patient presents with generalized weakness, declining activity tolerance over the last 3 weeks.  She reports having falls which is a chronic issue for her.  Reviewed recent hospital discharge summary noting that falls and generalized weakness are a longstanding issue for her.  She is well-known to this department over the past 10+ years as well.  No  evidence of acute illness.  Asked the patient about home physical therapy or considering placement in a rehab or skilled nursing facility, which she does not want to consider.   ----------------------------------------- 1:06 PM on 09/26/2024 ----------------------------------------- Patient comes the ED complaining of generalized weakness and falls which is a chronic issue for her.  Labs are essentially at her chronic baseline, mildly decreased from 1 month ago.  Will offer oral potassium supplement, she stable for discharge.  Her doctor is coordinating home IV magnesium  infusions and monitoring her electrolytes closely.  No evidence of cardiac instability or acute symptoms.       FINAL CLINICAL IMPRESSION(S) / ED DIAGNOSES   Final diagnoses:  Generalized weakness  Physical deconditioning     Rx / DC Orders   ED Discharge Orders     None        Note:  This document was prepared using Dragon voice recognition software and may include unintentional dictation errors.   Viviann Pastor, MD 09/26/24 1319

## 2024-09-26 NOTE — ED Notes (Signed)
 Pt adjusted in bed for comfort. Pt sitting up in bed, HOB elevated.

## 2024-09-26 NOTE — Discharge Instructions (Signed)
 Continue taking your medications, including your potassium and magnesium  supplements. Follow up with your doctor for further assessment of your symptoms.

## 2024-09-26 NOTE — ED Notes (Signed)
 [facility] CALLED FOR  TRANSPORT HOME

## 2024-09-26 NOTE — ED Notes (Signed)
 Pt refused potassium chloride  pills. This RN informed pt of importance of medication, pt verbalized understanding and still refused. MD made aware.

## 2024-09-26 NOTE — ED Notes (Signed)
 Pt stated she felt hot. This tech removed blankets and socks per pt request.
